# Patient Record
Sex: Female | Born: 1950
Health system: Southern US, Community
[De-identification: ages and names within clinical notes are randomized; demographics above are authoritative.]

## PROBLEM LIST (undated history)

## (undated) DIAGNOSIS — F419 Anxiety disorder, unspecified: Secondary | ICD-10-CM

## (undated) DIAGNOSIS — N390 Urinary tract infection, site not specified: Secondary | ICD-10-CM

## (undated) DIAGNOSIS — K219 Gastro-esophageal reflux disease without esophagitis: Secondary | ICD-10-CM

## (undated) DIAGNOSIS — E785 Hyperlipidemia, unspecified: Secondary | ICD-10-CM

## (undated) DIAGNOSIS — H409 Unspecified glaucoma: Secondary | ICD-10-CM

## (undated) DIAGNOSIS — I10 Essential (primary) hypertension: Secondary | ICD-10-CM

## (undated) DIAGNOSIS — E039 Hypothyroidism, unspecified: Secondary | ICD-10-CM

## (undated) DIAGNOSIS — H269 Unspecified cataract: Secondary | ICD-10-CM

## (undated) DIAGNOSIS — M1990 Unspecified osteoarthritis, unspecified site: Secondary | ICD-10-CM

## (undated) DIAGNOSIS — T7840XA Allergy, unspecified, initial encounter: Secondary | ICD-10-CM

## (undated) DIAGNOSIS — E669 Obesity, unspecified: Secondary | ICD-10-CM

## (undated) DIAGNOSIS — G4730 Sleep apnea, unspecified: Secondary | ICD-10-CM

## (undated) DIAGNOSIS — G4733 Obstructive sleep apnea (adult) (pediatric): Secondary | ICD-10-CM

## (undated) DIAGNOSIS — F32A Depression, unspecified: Secondary | ICD-10-CM

## (undated) DIAGNOSIS — D509 Iron deficiency anemia, unspecified: Secondary | ICD-10-CM

## (undated) DIAGNOSIS — F329 Major depressive disorder, single episode, unspecified: Secondary | ICD-10-CM

## (undated) HISTORY — DX: Iron deficiency anemia, unspecified: D50.9

## (undated) HISTORY — DX: Essential (primary) hypertension: I10

## (undated) HISTORY — DX: Unspecified glaucoma: H40.9

## (undated) HISTORY — DX: Allergy, unspecified, initial encounter: T78.40XA

## (undated) HISTORY — DX: Depression, unspecified: F32.A

## (undated) HISTORY — PX: BELPHAROPTOSIS REPAIR: SHX369

## (undated) HISTORY — DX: Anxiety disorder, unspecified: F41.9

## (undated) HISTORY — DX: Sleep apnea, unspecified: G47.30

## (undated) HISTORY — DX: Urinary tract infection, site not specified: N39.0

## (undated) HISTORY — DX: Obstructive sleep apnea (adult) (pediatric): G47.33

## (undated) HISTORY — DX: Gastro-esophageal reflux disease without esophagitis: K21.9

## (undated) HISTORY — DX: Unspecified osteoarthritis, unspecified site: M19.90

## (undated) HISTORY — PX: UPPER GASTROINTESTINAL ENDOSCOPY: SHX188

## (undated) HISTORY — DX: Hyperlipidemia, unspecified: E78.5

## (undated) HISTORY — PX: COLONOSCOPY: SHX174

## (undated) HISTORY — PX: FOOT SURGERY: SHX648

## (undated) HISTORY — DX: Hypothyroidism, unspecified: E03.9

## (undated) HISTORY — PX: ENDOMETRIAL BIOPSY: SHX622

## (undated) HISTORY — DX: Obesity, unspecified: E66.9

## (undated) HISTORY — DX: Unspecified cataract: H26.9

## (undated) HISTORY — PX: DILATION AND CURETTAGE OF UTERUS: SHX78

## (undated) HISTORY — DX: Major depressive disorder, single episode, unspecified: F32.9

---

## 1999-01-14 ENCOUNTER — Other Ambulatory Visit: Admission: RE | Admit: 1999-01-14 | Discharge: 1999-01-14 | Payer: Self-pay | Admitting: Family Medicine

## 2000-01-27 ENCOUNTER — Other Ambulatory Visit: Admission: RE | Admit: 2000-01-27 | Discharge: 2000-01-27 | Payer: Self-pay | Admitting: Family Medicine

## 2001-02-01 ENCOUNTER — Other Ambulatory Visit: Admission: RE | Admit: 2001-02-01 | Discharge: 2001-02-01 | Payer: Self-pay | Admitting: Family Medicine

## 2002-03-13 ENCOUNTER — Other Ambulatory Visit: Admission: RE | Admit: 2002-03-13 | Discharge: 2002-03-13 | Payer: Self-pay | Admitting: Family Medicine

## 2003-03-18 ENCOUNTER — Other Ambulatory Visit: Admission: RE | Admit: 2003-03-18 | Discharge: 2003-03-18 | Payer: Self-pay | Admitting: Family Medicine

## 2004-07-27 ENCOUNTER — Other Ambulatory Visit: Admission: RE | Admit: 2004-07-27 | Discharge: 2004-07-27 | Payer: Self-pay | Admitting: Family Medicine

## 2004-07-27 ENCOUNTER — Ambulatory Visit: Payer: Self-pay | Admitting: Family Medicine

## 2004-07-27 LAB — CONVERTED CEMR LAB: Pap Smear: NORMAL

## 2004-09-03 ENCOUNTER — Ambulatory Visit: Payer: Self-pay | Admitting: Family Medicine

## 2004-10-28 ENCOUNTER — Ambulatory Visit: Payer: Self-pay | Admitting: Family Medicine

## 2005-01-05 ENCOUNTER — Ambulatory Visit: Payer: Self-pay | Admitting: Family Medicine

## 2005-03-09 ENCOUNTER — Ambulatory Visit: Payer: Self-pay | Admitting: Family Medicine

## 2005-03-14 ENCOUNTER — Ambulatory Visit: Payer: Self-pay | Admitting: Family Medicine

## 2005-04-11 ENCOUNTER — Ambulatory Visit: Payer: Self-pay | Admitting: Family Medicine

## 2005-05-11 ENCOUNTER — Ambulatory Visit (HOSPITAL_BASED_OUTPATIENT_CLINIC_OR_DEPARTMENT_OTHER): Admission: RE | Admit: 2005-05-11 | Discharge: 2005-05-11 | Payer: Self-pay | Admitting: Family Medicine

## 2005-05-11 ENCOUNTER — Encounter: Payer: Self-pay | Admitting: Pulmonary Disease

## 2005-05-19 ENCOUNTER — Ambulatory Visit: Payer: Self-pay | Admitting: Pulmonary Disease

## 2005-06-06 ENCOUNTER — Ambulatory Visit: Payer: Self-pay | Admitting: Pulmonary Disease

## 2005-07-11 ENCOUNTER — Ambulatory Visit: Payer: Self-pay | Admitting: Pulmonary Disease

## 2005-08-17 ENCOUNTER — Ambulatory Visit: Payer: Self-pay | Admitting: Family Medicine

## 2005-09-23 ENCOUNTER — Ambulatory Visit: Payer: Self-pay | Admitting: Family Medicine

## 2005-10-06 ENCOUNTER — Ambulatory Visit: Payer: Self-pay | Admitting: Family Medicine

## 2005-12-06 ENCOUNTER — Ambulatory Visit: Payer: Self-pay | Admitting: Family Medicine

## 2006-01-13 ENCOUNTER — Ambulatory Visit: Payer: Self-pay | Admitting: Pulmonary Disease

## 2006-01-16 ENCOUNTER — Ambulatory Visit: Payer: Self-pay | Admitting: Family Medicine

## 2006-01-18 ENCOUNTER — Ambulatory Visit: Payer: Self-pay | Admitting: Family Medicine

## 2006-01-20 ENCOUNTER — Ambulatory Visit: Payer: Self-pay | Admitting: Family Medicine

## 2006-01-27 ENCOUNTER — Ambulatory Visit: Payer: Self-pay | Admitting: Family Medicine

## 2006-02-17 ENCOUNTER — Ambulatory Visit: Payer: Self-pay | Admitting: Family Medicine

## 2006-02-23 ENCOUNTER — Ambulatory Visit: Payer: Self-pay | Admitting: Family Medicine

## 2006-07-05 ENCOUNTER — Ambulatory Visit: Payer: Self-pay | Admitting: Family Medicine

## 2006-07-17 ENCOUNTER — Ambulatory Visit: Payer: Self-pay | Admitting: Pulmonary Disease

## 2006-08-23 ENCOUNTER — Ambulatory Visit: Payer: Self-pay | Admitting: Family Medicine

## 2006-10-06 ENCOUNTER — Ambulatory Visit: Payer: Self-pay | Admitting: Family Medicine

## 2006-11-24 ENCOUNTER — Encounter: Payer: Self-pay | Admitting: Family Medicine

## 2006-11-24 DIAGNOSIS — G4730 Sleep apnea, unspecified: Secondary | ICD-10-CM

## 2006-11-24 DIAGNOSIS — K449 Diaphragmatic hernia without obstruction or gangrene: Secondary | ICD-10-CM | POA: Insufficient documentation

## 2006-11-24 DIAGNOSIS — E785 Hyperlipidemia, unspecified: Secondary | ICD-10-CM

## 2006-11-24 DIAGNOSIS — I4949 Other premature depolarization: Secondary | ICD-10-CM

## 2006-11-24 DIAGNOSIS — G2581 Restless legs syndrome: Secondary | ICD-10-CM

## 2006-11-24 DIAGNOSIS — J45909 Unspecified asthma, uncomplicated: Secondary | ICD-10-CM | POA: Insufficient documentation

## 2006-11-24 DIAGNOSIS — J309 Allergic rhinitis, unspecified: Secondary | ICD-10-CM | POA: Insufficient documentation

## 2006-11-24 DIAGNOSIS — E1159 Type 2 diabetes mellitus with other circulatory complications: Secondary | ICD-10-CM | POA: Insufficient documentation

## 2006-11-24 DIAGNOSIS — I152 Hypertension secondary to endocrine disorders: Secondary | ICD-10-CM | POA: Insufficient documentation

## 2006-11-24 DIAGNOSIS — E119 Type 2 diabetes mellitus without complications: Secondary | ICD-10-CM | POA: Insufficient documentation

## 2006-11-24 DIAGNOSIS — M5030 Other cervical disc degeneration, unspecified cervical region: Secondary | ICD-10-CM

## 2006-11-24 DIAGNOSIS — N318 Other neuromuscular dysfunction of bladder: Secondary | ICD-10-CM | POA: Insufficient documentation

## 2006-11-24 DIAGNOSIS — K649 Unspecified hemorrhoids: Secondary | ICD-10-CM | POA: Insufficient documentation

## 2006-11-24 DIAGNOSIS — E039 Hypothyroidism, unspecified: Secondary | ICD-10-CM

## 2006-11-24 DIAGNOSIS — I10 Essential (primary) hypertension: Secondary | ICD-10-CM

## 2006-11-24 DIAGNOSIS — K219 Gastro-esophageal reflux disease without esophagitis: Secondary | ICD-10-CM

## 2006-11-28 ENCOUNTER — Other Ambulatory Visit: Admission: RE | Admit: 2006-11-28 | Discharge: 2006-11-28 | Payer: Self-pay | Admitting: Family Medicine

## 2006-11-28 ENCOUNTER — Ambulatory Visit: Payer: Self-pay | Admitting: Family Medicine

## 2006-11-28 ENCOUNTER — Encounter: Payer: Self-pay | Admitting: Family Medicine

## 2006-12-01 ENCOUNTER — Ambulatory Visit: Payer: Self-pay | Admitting: Family Medicine

## 2006-12-01 LAB — CONVERTED CEMR LAB
ALT: 19 units/L (ref 0–40)
AST: 18 units/L (ref 0–37)
Alkaline Phosphatase: 59 units/L (ref 39–117)
Basophils Absolute: 0 10*3/uL (ref 0.0–0.1)
Basophils Relative: 0.5 % (ref 0.0–1.0)
Chloride: 106 meq/L (ref 96–112)
Direct LDL: 80.6 mg/dL
GFR calc Af Amer: 165 mL/min
GFR calc non Af Amer: 136 mL/min
Hemoglobin: 12.6 g/dL (ref 12.0–15.0)
Hgb A1c MFr Bld: 6.7 % — ABNORMAL HIGH (ref 4.6–6.0)
Microalb, Ur: 5.1 mg/dL — ABNORMAL HIGH (ref 0.0–1.9)
Monocytes Absolute: 0.4 10*3/uL (ref 0.2–0.7)
Neutro Abs: 3.3 10*3/uL (ref 1.4–7.7)
Potassium: 3.8 meq/L (ref 3.5–5.1)
RDW: 12 % (ref 11.5–14.6)
Sodium: 143 meq/L (ref 135–145)
Total Bilirubin: 0.9 mg/dL (ref 0.3–1.2)
Total CHOL/HDL Ratio: 4
Total Protein: 6.4 g/dL (ref 6.0–8.3)

## 2007-02-13 ENCOUNTER — Ambulatory Visit: Payer: Self-pay | Admitting: Family Medicine

## 2007-06-29 ENCOUNTER — Ambulatory Visit: Payer: Self-pay | Admitting: Family Medicine

## 2007-11-11 ENCOUNTER — Encounter: Payer: Self-pay | Admitting: Internal Medicine

## 2007-11-13 ENCOUNTER — Ambulatory Visit: Payer: Self-pay | Admitting: Internal Medicine

## 2008-03-28 ENCOUNTER — Ambulatory Visit: Payer: Self-pay | Admitting: Family Medicine

## 2008-03-28 DIAGNOSIS — N951 Menopausal and female climacteric states: Secondary | ICD-10-CM | POA: Insufficient documentation

## 2008-04-02 ENCOUNTER — Ambulatory Visit: Payer: Self-pay | Admitting: Family Medicine

## 2008-04-03 LAB — CONVERTED CEMR LAB
AST: 32 units/L (ref 0–37)
Alkaline Phosphatase: 59 units/L (ref 39–117)
BUN: 13 mg/dL (ref 6–23)
Basophils Absolute: 0 10*3/uL (ref 0.0–0.1)
CO2: 30 meq/L (ref 19–32)
Calcium: 9.3 mg/dL (ref 8.4–10.5)
Chloride: 102 meq/L (ref 96–112)
Creatinine, Ser: 0.7 mg/dL (ref 0.4–1.2)
GFR calc Af Amer: 111 mL/min
HDL: 31 mg/dL — ABNORMAL LOW (ref >39.0)
Hgb A1c MFr Bld: 8.3 % — ABNORMAL HIGH (ref 4.6–6.0)
Lymphocytes Relative: 28.6 % (ref 12.0–46.0)
MCHC: 35.9 g/dL (ref 30.0–36.0)
Microalb Creat Ratio: 54 mg/g — ABNORMAL HIGH (ref 0.0–30.0)
Microalb, Ur: 5.5 mg/dL — ABNORMAL HIGH (ref 0.0–1.9)
Monocytes Relative: 6.6 % (ref 3.0–12.0)
Neutrophils Relative %: 60.3 % (ref 43.0–77.0)
TSH: 1.78 microintl units/mL (ref 0.35–5.50)
VLDL: 50 mg/dL — ABNORMAL HIGH (ref 0–40)

## 2008-04-22 ENCOUNTER — Encounter: Payer: Self-pay | Admitting: Family Medicine

## 2008-05-02 ENCOUNTER — Ambulatory Visit: Payer: Self-pay | Admitting: Family Medicine

## 2008-05-07 ENCOUNTER — Encounter: Payer: Self-pay | Admitting: Family Medicine

## 2008-05-14 ENCOUNTER — Telehealth: Payer: Self-pay | Admitting: Family Medicine

## 2008-06-03 ENCOUNTER — Telehealth: Payer: Self-pay | Admitting: Family Medicine

## 2008-06-23 ENCOUNTER — Ambulatory Visit: Payer: Self-pay | Admitting: Family Medicine

## 2008-06-25 LAB — CONVERTED CEMR LAB
Albumin: 3.9 g/dL (ref 3.5–5.2)
CO2: 31 meq/L (ref 19–32)
Calcium: 9.7 mg/dL (ref 8.4–10.5)
Creatinine, Ser: 0.8 mg/dL (ref 0.4–1.2)
Glucose, Bld: 115 mg/dL — ABNORMAL HIGH (ref 70–99)
Phosphorus: 4.7 mg/dL — ABNORMAL HIGH (ref 2.3–4.6)
Potassium: 4 meq/L (ref 3.5–5.1)

## 2008-08-15 ENCOUNTER — Encounter: Payer: Self-pay | Admitting: Family Medicine

## 2008-09-15 ENCOUNTER — Ambulatory Visit: Payer: Self-pay | Admitting: Family Medicine

## 2008-09-16 LAB — CONVERTED CEMR LAB
ALT: 19 units/L (ref 0–35)
AST: 18 units/L (ref 0–37)
Albumin: 3.8 g/dL (ref 3.5–5.2)
BUN: 17 mg/dL (ref 6–23)
GFR calc Af Amer: 95 mL/min
GFR calc non Af Amer: 79 mL/min
Glucose, Bld: 109 mg/dL — ABNORMAL HIGH (ref 70–99)
LDL Cholesterol: 82 mg/dL (ref 0–99)
Phosphorus: 3.9 mg/dL (ref 2.3–4.6)

## 2008-09-22 ENCOUNTER — Ambulatory Visit: Payer: Self-pay | Admitting: Family Medicine

## 2008-11-26 ENCOUNTER — Ambulatory Visit: Payer: Self-pay | Admitting: Family Medicine

## 2008-11-26 LAB — CONVERTED CEMR LAB
Bilirubin Urine: NEGATIVE
Blood in Urine, dipstick: NEGATIVE
Glucose, Urine, Semiquant: NEGATIVE
Ketones, urine, test strip: NEGATIVE
Nitrite: NEGATIVE
Urobilinogen, UA: 0.2

## 2009-02-09 ENCOUNTER — Ambulatory Visit: Payer: Self-pay | Admitting: Family Medicine

## 2009-03-10 ENCOUNTER — Ambulatory Visit: Payer: Self-pay | Admitting: Family Medicine

## 2009-03-23 ENCOUNTER — Ambulatory Visit: Payer: Self-pay | Admitting: Family Medicine

## 2009-03-24 LAB — CONVERTED CEMR LAB
ALT: 19 units/L (ref 0–35)
AST: 22 units/L (ref 0–37)
Cholesterol: 141 mg/dL (ref 0–200)
Microalb Creat Ratio: 6.6 mg/g (ref 0.0–30.0)
Sodium: 142 meq/L (ref 135–145)
Triglycerides: 149 mg/dL (ref 0.0–149.0)

## 2009-03-31 ENCOUNTER — Telehealth: Payer: Self-pay | Admitting: Family Medicine

## 2009-04-03 ENCOUNTER — Ambulatory Visit: Payer: Self-pay | Admitting: Family Medicine

## 2009-04-03 ENCOUNTER — Telehealth: Payer: Self-pay | Admitting: Family Medicine

## 2009-04-23 ENCOUNTER — Encounter: Payer: Self-pay | Admitting: Family Medicine

## 2009-05-04 ENCOUNTER — Encounter (INDEPENDENT_AMBULATORY_CARE_PROVIDER_SITE_OTHER): Payer: Self-pay | Admitting: *Deleted

## 2009-05-08 ENCOUNTER — Ambulatory Visit: Payer: Self-pay | Admitting: Family Medicine

## 2009-07-09 ENCOUNTER — Ambulatory Visit: Payer: Self-pay | Admitting: Family Medicine

## 2009-07-09 ENCOUNTER — Encounter (INDEPENDENT_AMBULATORY_CARE_PROVIDER_SITE_OTHER): Payer: Self-pay | Admitting: Internal Medicine

## 2009-07-09 LAB — CONVERTED CEMR LAB
Bilirubin Urine: NEGATIVE
Glucose, Urine, Semiquant: NEGATIVE
Nitrite: POSITIVE
Protein, U semiquant: 30
Specific Gravity, Urine: 1.015
pH: 7

## 2009-07-13 ENCOUNTER — Telehealth: Payer: Self-pay | Admitting: Family Medicine

## 2009-08-06 ENCOUNTER — Ambulatory Visit: Payer: Self-pay | Admitting: Family Medicine

## 2009-08-06 LAB — CONVERTED CEMR LAB
Glucose, Urine, Semiquant: NEGATIVE
Nitrite: NEGATIVE
Specific Gravity, Urine: 1.01
Urobilinogen, UA: 0.2
pH: 7.5

## 2009-08-07 ENCOUNTER — Encounter (INDEPENDENT_AMBULATORY_CARE_PROVIDER_SITE_OTHER): Payer: Self-pay | Admitting: Internal Medicine

## 2009-09-21 ENCOUNTER — Ambulatory Visit: Payer: Self-pay | Admitting: Family Medicine

## 2009-09-22 ENCOUNTER — Telehealth: Payer: Self-pay | Admitting: Family Medicine

## 2009-09-22 LAB — CONVERTED CEMR LAB
ALT: 22 units/L (ref 0–35)
BUN: 17 mg/dL (ref 6–23)
CO2: 28 meq/L (ref 19–32)
Chloride: 107 meq/L (ref 96–112)
HDL: 37.5 mg/dL — ABNORMAL LOW (ref >39.00)
Phosphorus: 3.9 mg/dL (ref 2.3–4.6)
Total CHOL/HDL Ratio: 4
VLDL: 25 mg/dL (ref 0.0–40.0)

## 2009-09-28 ENCOUNTER — Ambulatory Visit: Payer: Self-pay | Admitting: Family Medicine

## 2009-11-03 ENCOUNTER — Ambulatory Visit: Payer: Self-pay | Admitting: Family Medicine

## 2009-11-04 ENCOUNTER — Encounter: Payer: Self-pay | Admitting: Family Medicine

## 2009-11-04 LAB — CONVERTED CEMR LAB: Free T4: 1.3 ng/dL (ref 0.6–1.6)

## 2009-12-03 ENCOUNTER — Ambulatory Visit: Payer: Self-pay | Admitting: Family Medicine

## 2009-12-03 LAB — CONVERTED CEMR LAB
Bilirubin Urine: NEGATIVE
Protein, U semiquant: 30
Specific Gravity, Urine: 1.015
Urobilinogen, UA: 0.2
pH: 7

## 2009-12-17 ENCOUNTER — Ambulatory Visit: Payer: Self-pay | Admitting: Family Medicine

## 2009-12-22 ENCOUNTER — Encounter: Payer: Self-pay | Admitting: Family Medicine

## 2010-01-05 ENCOUNTER — Ambulatory Visit: Payer: Self-pay | Admitting: Family Medicine

## 2010-01-14 ENCOUNTER — Telehealth: Payer: Self-pay | Admitting: Family Medicine

## 2010-01-18 ENCOUNTER — Ambulatory Visit: Payer: Self-pay | Admitting: Family Medicine

## 2010-01-18 LAB — CONVERTED CEMR LAB
Ketones, urine, test strip: NEGATIVE
Nitrite: NEGATIVE

## 2010-01-29 ENCOUNTER — Ambulatory Visit: Payer: Self-pay | Admitting: Pulmonary Disease

## 2010-02-03 ENCOUNTER — Ambulatory Visit: Payer: Self-pay | Admitting: Family Medicine

## 2010-02-03 DIAGNOSIS — N390 Urinary tract infection, site not specified: Secondary | ICD-10-CM

## 2010-02-13 LAB — CONVERTED CEMR LAB: Hgb A1c MFr Bld: 7.2 % — ABNORMAL HIGH (ref 4.6–6.5)

## 2010-03-09 ENCOUNTER — Ambulatory Visit: Payer: Self-pay | Admitting: Endocrinology

## 2010-03-12 ENCOUNTER — Encounter: Admission: RE | Admit: 2010-03-12 | Discharge: 2010-03-12 | Payer: Self-pay | Admitting: Endocrinology

## 2010-03-16 ENCOUNTER — Ambulatory Visit: Payer: Self-pay | Admitting: Family Medicine

## 2010-03-16 LAB — CONVERTED CEMR LAB
ALT: 19 units/L (ref 0–35)
Albumin: 3.7 g/dL (ref 3.5–5.2)
Chloride: 109 meq/L (ref 96–112)
Direct LDL: 78.4 mg/dL
GFR calc non Af Amer: 84.03 mL/min (ref >60)
HDL: 32.7 mg/dL — ABNORMAL LOW (ref >39.00)
Microalb Creat Ratio: 1.5 mg/g (ref 0.0–30.0)
Microalb, Ur: 4.6 mg/dL — ABNORMAL HIGH (ref 0.0–1.9)
Sodium: 144 meq/L (ref 135–145)
Total CHOL/HDL Ratio: 4
VLDL: 49.6 mg/dL — ABNORMAL HIGH (ref 0.0–40.0)

## 2010-03-31 ENCOUNTER — Telehealth: Payer: Self-pay | Admitting: Family Medicine

## 2010-04-06 ENCOUNTER — Other Ambulatory Visit: Admission: RE | Admit: 2010-04-06 | Discharge: 2010-04-06 | Payer: Self-pay | Admitting: Family Medicine

## 2010-04-06 ENCOUNTER — Ambulatory Visit: Payer: Self-pay | Admitting: Family Medicine

## 2010-04-06 LAB — CONVERTED CEMR LAB: Pap Smear: NORMAL

## 2010-04-13 ENCOUNTER — Telehealth: Payer: Self-pay | Admitting: Family Medicine

## 2010-04-14 ENCOUNTER — Encounter: Payer: Self-pay | Admitting: Family Medicine

## 2010-04-14 LAB — CONVERTED CEMR LAB: Pap Smear: NEGATIVE

## 2010-04-22 ENCOUNTER — Ambulatory Visit: Payer: Self-pay | Admitting: Family Medicine

## 2010-05-05 ENCOUNTER — Encounter: Admission: RE | Admit: 2010-05-05 | Discharge: 2010-05-26 | Payer: Self-pay | Admitting: Family Medicine

## 2010-05-05 ENCOUNTER — Ambulatory Visit: Payer: Self-pay | Admitting: Endocrinology

## 2010-05-06 ENCOUNTER — Encounter: Payer: Self-pay | Admitting: Family Medicine

## 2010-05-12 ENCOUNTER — Encounter: Payer: Self-pay | Admitting: Family Medicine

## 2010-05-13 ENCOUNTER — Telehealth: Payer: Self-pay | Admitting: Family Medicine

## 2010-05-17 ENCOUNTER — Encounter (INDEPENDENT_AMBULATORY_CARE_PROVIDER_SITE_OTHER): Payer: Self-pay | Admitting: *Deleted

## 2010-05-17 ENCOUNTER — Encounter: Payer: Self-pay | Admitting: Family Medicine

## 2010-05-22 ENCOUNTER — Encounter: Payer: Self-pay | Admitting: Pulmonary Disease

## 2010-05-26 ENCOUNTER — Encounter: Payer: Self-pay | Admitting: Family Medicine

## 2010-05-27 ENCOUNTER — Ambulatory Visit: Payer: Self-pay | Admitting: Family Medicine

## 2010-07-05 ENCOUNTER — Ambulatory Visit: Payer: Self-pay | Admitting: Family Medicine

## 2010-07-09 ENCOUNTER — Telehealth: Payer: Self-pay | Admitting: Family Medicine

## 2010-07-21 ENCOUNTER — Ambulatory Visit: Payer: Self-pay | Admitting: Endocrinology

## 2010-08-09 ENCOUNTER — Encounter: Payer: Self-pay | Admitting: Family Medicine

## 2010-09-02 ENCOUNTER — Ambulatory Visit
Admission: RE | Admit: 2010-09-02 | Discharge: 2010-09-02 | Payer: Self-pay | Source: Home / Self Care | Attending: Endocrinology | Admitting: Endocrinology

## 2010-09-07 LAB — CONVERTED CEMR LAB: TSH: 3.8 microintl units/mL (ref 0.35–5.50)

## 2010-10-05 NOTE — Progress Notes (Signed)
Summary: refill request for mobic  Phone Note Refill Request Message from:  Fax from Pharmacy  Refills Requested: Medication #1:  MOBIC 15 MG TABS Take 1 tablet by mouth once a day with food as needed   Last Refilled: 10/30/2009 Faxed request from [facility].  Initial call taken by: Lowella Petties CMA,  Jan 14, 2010 2:11 PM  Follow-up for Phone Call        px written on EMR for call in  Follow-up by: Judith Part MD,  Jan 14, 2010 4:17 PM  Additional Follow-up for Phone Call Additional follow up Details #1::        [facility]. Lewanda Rife LPN  Jan 15, 2010 12:59 PM     Prescriptions: MOBIC 15 MG TABS (MELOXICAM) Take 1 tablet by mouth once a day with food as needed  #30 x 5   Entered and Authorized by:   Judith Part MD   Signed by:   Lewanda Rife LPN on 16/06/9603   Method used:   Telephoned to ...       [facility] [address] (retail)       [address] [place], [place]  [address]       Ph: [phone]       Fax: [phone]   RxID:   [id number]

## 2010-10-05 NOTE — Progress Notes (Signed)
Summary: refill request for mobic  Phone Note Refill Request Message from:  Fax from Pharmacy  Refills Requested: Medication #1:  MOBIC 15 MG TABS Take 1 tablet by mouth once a day with food as needed   Last Refilled: 02-13-1951 Faxed request from [facility] [address]  Initial call taken by: Lowella Petties CMA, AAMA,  July 09, 2010 10:24 AM  Follow-up for Phone Call        looks like she was refilled for the year in aug?   Follow-up by: Judith Part MD,  July 09, 2010 11:16 AM  Additional Follow-up for Phone Call Additional follow up Details #1::        Spoke with pharmacist at [facility] and he could not find call in from 04/13/10 but he will refill until 04/2011.Lewanda Rife LPN  July 09, 2010 2:59 PM

## 2010-10-05 NOTE — Progress Notes (Signed)
Summary: ? Diabetes medication  Phone Note Call from Patient Call back at Home Phone [phone]   Caller: Patient Call For: Judith Part MD Summary of Call: Patient called said she thinks she has a virus.  Has a low grade fever and is vomiting.  She is a diabetic and wants to know if she should continue to try and take her diabetes medication or should she wait and see if she feels better, and not vomiting before she takes medication again.  Please advise Initial call taken by: Linde Gillis CMA Duncan Dull),  September 22, 2009 4:49 PM  Follow-up for Phone Call        I want her to hold her glucotrol xl until she is eating normally again-- this is most likely to cause low sugar f/u if not improving Follow-up by: Judith Part MD,  September 22, 2009 5:02 PM  Additional Follow-up for Phone Call Additional follow up Details #1::        Advised pt. Additional Follow-up by: Lowella Petties CMA,  September 22, 2009 5:21 PM

## 2010-10-05 NOTE — Letter (Signed)
Summary: Results Follow up Letter  [place] [facility]  [address] [place], [place] [address]   Phone: [phone]  Fax: [phone]    04/14/2010 MRN: [id number]  [facility] [address] [place], [place]  [address]  Dear Ms. Coppolino,  The following are the results of your recent test(s):  Test         Result    Pap Smear:        Normal __X___  Not Normal _____ Comments: ______________________________________________________ Cholesterol: LDL(Bad cholesterol):         Your goal is less than:         HDL (Good cholesterol):       Your goal is more than: Comments:  ______________________________________________________ Mammogram:        Normal _____  Not Normal _____ Comments:  ___________________________________________________________________ Hemoccult:        Normal _____  Not normal _______ Comments:    _____________________________________________________________________ Other Tests:    We routinely do not discuss normal results over the telephone.  If you desire a copy of the results, or you have any questions about this information we can discuss them at your next office visit.   Sincerely,     Roxy Manns, MD

## 2010-10-05 NOTE — Miscellaneous (Signed)
Summary: optimal pressure 17cm   Clinical Lists Changes  Orders: Added new Referral order of DME Referral (DME) - Signed auto shows great compliance, no leak, optimal pressure 17cm

## 2010-10-05 NOTE — Miscellaneous (Signed)
Summary: Flu vaccine   Clinical Lists Changes  Observations: Added new observation of FLU VAX: Historical (08/04/2010 13:51)      Influenza Immunization History:    Influenza # 1:  Historical (08/04/2010) Received faxed form from [facility], [place], [place]

## 2010-10-05 NOTE — Assessment & Plan Note (Signed)
Summary: ?UTI/CLE   Vital Signs:  Patient profile:   60 year old female Height:      64 inches Weight:      221.25 pounds BMI:     38.11 Temp:     98.3 degrees F oral Pulse rate:   80 / minute Pulse rhythm:   regular BP sitting:   122 / 82  (left arm) Cuff size:   large  Vitals Entered By: Linde Gillis CMA Duncan Dull) (Jan 18, 2010 2:55 PM) CC: ? UTI   History of Present Illness: 60 yo here for UTI symptoms.  Woke up this morning with increased urinary frequency, dysuria. No hematuria. No n/v, no fevers, no back pain.  Last seen on 3/31 with UTI, was pansenstive.  Treat with cipro.  PMH- has had 5 UTIs in past 10 months.  Always occurs after sexual intercourse.  Current Medications (verified): 1)  Flexeril 10 Mg Tabs (Cyclobenzaprine Hcl) .... Take 1 Tablet By Mouth Three Times A Day As Needed 2)  Monopril Hct 10-12.5 Mg Tabs (Fosinopril Sodium-Hctz) .... Take 1 Tablet By Mouth Once A Day 3)  Lipitor 20 Mg Tabs (Atorvastatin Calcium) .... Take 1/2 Tablet By Mouth Once A Day 4)  Glucotrol Xl 5 Mg Tb24 (Glipizide) .... Take 1 Tablet By Mouth Once A Day 5)  Hyoscyamine Sulfate 0.125 Mg Subl (Hyoscyamine Sulfate) .... Place 1 Tablet Under Tongue Once A Day As Needed 6)  Mobic 15 Mg Tabs (Meloxicam) .... Take 1 Tablet By Mouth Once A Day With Food As Needed 7)  Loratadine 10 Mg Tabs (Loratadine) .... Take One By Mouth Dialy 8)  Albuterol 90 Mcg/act Aers (Albuterol) .... 2 Puffs Up To Every 4 Hours As Needed Wheezing 9)  Advair Diskus 100-50 Mcg/dose Misc (Fluticasone-Salmeterol) .Marland Kitchen.. 1 Inhalation Two Times A Day During Allergy Season 10)  Calcium 600 Mg Tabs (Calcium) .... Take 2 By Mouth Daily 11)  Onetouch Ultra Test   Strp (Glucose Blood) .... Check Sugar Twice A Day As Instructed Dm 250.0 That Is Not Optimally Controlled 12)  Metformin Hcl 1000 Mg Tabs (Metformin Hcl) .Marland Kitchen.. 1 By Mouth Two Times A Day 13)  Multivitamins   Tabs (Multiple Vitamin) .... One Daily 14)  Pyridium 100 Mg  Tabs (Phenazopyridine Hcl) .Marland Kitchen.. 1 Tab By Mouth Three Times A Day X 2 Days 15)  Synthroid 50 Mcg Tabs (Levothyroxine Sodium) .... Take 1 Tablet By Mouth Once A Day 16)  Prednisone 10 Mg Tabs (Prednisone) .... 4 Tab By Mouth X 1 Week, 3 Tabs By Mouth X 1 Week, 2 Tabs By Mouth X 4 Days, 1 Tab By Mouth X Days. Dispense Qs 17)  Bactrim Ds 800-160 Mg Tabs (Sulfamethoxazole-Trimethoprim) .Marland Kitchen.. 1 Tab By Mouth Two Times A Day X 3 Days. 18)  Pyridium 100 Mg Tabs (Phenazopyridine Hcl) .Marland Kitchen.. 1 Tab By Mouth Three Times A Day X 2 Days 19)  Sulfamethoxazole-Trimethoprim 400-80 Mg Tabs (Sulfamethoxazole-Trimethoprim) .Marland Kitchen.. 1 Tab By Mouth After Intercourse.  Allergies (verified): No Known Drug Allergies  Review of Systems      See HPI General:  Denies fever. GI:  Denies nausea and vomiting. GU:  Complains of dysuria and urinary frequency; denies hematuria and incontinence.  Physical Exam  General:  overweight but generally well appearing  Abdomen:  No suprapubic tenderness NO CVA tenderness Psych:  normal affect, talkative and pleasant    Impression & Recommendations:  Problem # 1:  UTI (ICD-599.0) Assessment New UA postive, resend for culture. Place on 3 day  course of bactrim and pyridium. Start post coital prophylaxis. Her updated medication list for this problem includes:    Pyridium 100 Mg Tabs (Phenazopyridine hcl) .Marland Kitchen... 1 tab by mouth three times a day x 2 days    Bactrim Ds 800-160 Mg Tabs (Sulfamethoxazole-trimethoprim) .Marland Kitchen... 1 tab by mouth two times a day x 3 days.    Sulfamethoxazole-trimethoprim 400-80 Mg Tabs (Sulfamethoxazole-trimethoprim) .Marland Kitchen... 1 tab by mouth after intercourse.  Orders: UA Dipstick w/o Micro (manual) (81191) T-Culture, Urine ([id number])  Complete Medication List: 1)  Flexeril 10 Mg Tabs (Cyclobenzaprine hcl) .... Take 1 tablet by mouth three times a day as needed 2)  Monopril Hct 10-12.5 Mg Tabs (Fosinopril sodium-hctz) .... Take 1 tablet by mouth once a day 3)   Lipitor 20 Mg Tabs (Atorvastatin calcium) .... Take 1/2 tablet by mouth once a day 4)  Glucotrol Xl 5 Mg Tb24 (Glipizide) .... Take 1 tablet by mouth once a day 5)  Hyoscyamine Sulfate 0.125 Mg Subl (Hyoscyamine sulfate) .... Place 1 tablet under tongue once a day as needed 6)  Mobic 15 Mg Tabs (Meloxicam) .... Take 1 tablet by mouth once a day with food as needed 7)  Loratadine 10 Mg Tabs (Loratadine) .... Take one by mouth dialy 8)  Albuterol 90 Mcg/act Aers (Albuterol) .... 2 puffs up to every 4 hours as needed wheezing 9)  Advair Diskus 100-50 Mcg/dose Misc (Fluticasone-salmeterol) .Marland Kitchen.. 1 inhalation two times a day during allergy season 10)  Calcium 600 Mg Tabs (Calcium) .... Take 2 by mouth daily 11)  Onetouch Ultra Test Strp (Glucose blood) .... Check sugar twice a day as instructed dm 250.0 that is not optimally controlled 12)  Metformin Hcl 1000 Mg Tabs (Metformin hcl) .Marland Kitchen.. 1 by mouth two times a day 13)  Multivitamins Tabs (Multiple vitamin) .... One daily 14)  Pyridium 100 Mg Tabs (Phenazopyridine hcl) .Marland Kitchen.. 1 tab by mouth three times a day x 2 days 15)  Synthroid 50 Mcg Tabs (Levothyroxine sodium) .... Take 1 tablet by mouth once a day 16)  Prednisone 10 Mg Tabs (Prednisone) .... 4 tab by mouth x 1 week, 3 tabs by mouth x 1 week, 2 tabs by mouth x 4 days, 1 tab by mouth x days. dispense qs 17)  Bactrim Ds 800-160 Mg Tabs (Sulfamethoxazole-trimethoprim) .Marland Kitchen.. 1 tab by mouth two times a day x 3 days. 18)  Pyridium 100 Mg Tabs (Phenazopyridine hcl) .Marland Kitchen.. 1 tab by mouth three times a day x 2 days 19)  Sulfamethoxazole-trimethoprim 400-80 Mg Tabs (Sulfamethoxazole-trimethoprim) .Marland Kitchen.. 1 tab by mouth after intercourse. Prescriptions: SULFAMETHOXAZOLE-TRIMETHOPRIM 400-80 MG TABS (SULFAMETHOXAZOLE-TRIMETHOPRIM) 1 tab by mouth after intercourse.  #30 x 0   Entered and Authorized by:   Ruthe Mannan MD   Signed by:   Ruthe Mannan MD on 01/18/2010   Method used:   Electronically to        [facility] [address] (retail)       [address] [place], [place]  [address]       Ph: [phone]       Fax: [phone]   RxID:   [id number] PYRIDIUM 100 MG TABS (PHENAZOPYRIDINE HCL) 1 tab by mouth three times a day x 2 days  #6 x 0   Entered and Authorized by:   Ruthe Mannan MD   Signed by:   Ruthe Mannan MD on 01/18/2010   Method used:  Electronically to        [facility]. [address]* (retail)       [address] [place], [place]  [address]       Ph: [phone]       Fax: [phone]   RxID:   [id number] BACTRIM DS 800-160 MG TABS (SULFAMETHOXAZOLE-TRIMETHOPRIM) 1 tab by mouth two times a day x 3 days.  #6 x 0   Entered and Authorized by:   Ruthe Mannan MD   Signed by:   Ruthe Mannan MD on 01/18/2010   Method used:   Electronically to        [facility] [address] (retail)       [address] [place], [place]  [address]       Ph: [phone]       Fax: [phone]   RxID:   [id number]   Current Allergies (reviewed today): No known allergies   Laboratory Results   Urine Tests  Date/Time Received: Jan 18, 2010 3:13 PM   Routine Urinalysis   Color: orange Appearance: Cloudy Glucose: negative   (Normal Range: Negative) Bilirubin: negative   (Normal Range: Negative) Ketone: negative   (Normal Range: Negative) Spec. Gravity: >=1.030   (Normal Range: 1.003-1.035) Blood: moderate   (Normal Range: Negative) pH: 5.0   (Normal Range: 5.0-8.0) Protein: trace   (Normal Range: Negative) Urobilinogen: 0.2   (Normal Range: 0-1) Nitrite: negative   (Normal Range: Negative) Leukocyte Esterace: moderate   (Normal Range: Negative)        Appended Document: ?UTI/CLE

## 2010-10-05 NOTE — Miscellaneous (Signed)
Summary: Synthroid  Medications Added SYNTHROID 75 MCG TABS (LEVOTHYROXINE SODIUM) Take 1 tablet by mouth once a day       Clinical Lists Changes  Medications: Added new medication of SYNTHROID 75 MCG TABS (LEVOTHYROXINE SODIUM) Take 1 tablet by mouth once a day - Signed Removed medication of SYNTHROID 88 MCG TABS (LEVOTHYROXINE SODIUM) 1 by mouth once daily Rx of SYNTHROID 75 MCG TABS (LEVOTHYROXINE SODIUM) Take 1 tablet by mouth once a day;  #30 x 5;  Signed;  Entered by: Delilah Shan CMA (AAMA);  Authorized by: Judith Part MD;  Method used: Electronically to [facility]. [address], [place], [place], [place]  [address], Ph: [phone], Fax: [phone]    Prescriptions: SYNTHROID 75 MCG TABS (LEVOTHYROXINE SODIUM) Take 1 tablet by mouth once a day  #30 x 5   Entered by:   Delilah Shan CMA (AAMA)   Authorized by:   Judith Part MD   Signed by:   Delilah Shan CMA Duncan Dull) on 11/04/2009   Method used:   Electronically to        [facility] [address] (retail)       [address] [place], [place]  [address]       Ph: [phone]       Fax: [phone]   RxID:   [id number]

## 2010-10-05 NOTE — Assessment & Plan Note (Signed)
Summary: REFER FRM DR. TOWER FOR SHOULDER PAIN / LFW   Vital Signs:  Patient profile:   60 year old female Height:      64 inches Weight:      221.6 pounds BMI:     38.18 Temp:     98.3 degrees F oral Pulse rate:   84 / minute Pulse rhythm:   regular BP sitting:   110 / 70  (left arm) Cuff size:   large  Vitals Entered By: Benny Lennert CMA Duncan Dull) (April 22, 2010 9:04 AM)  History of Present Illness: Dr. Milinda Antis referred the patient for a consultation about bilateral shoulder pain:  two-month history of bilateral shoulder pain, left greater than right. No incidence of trauma, fracture, dislocation. The patient has pain with abduction, and also some pain with terminal internal range of motion, primarily on the left. At this point, her right shoulder is minimally symptomatic.  She has taken some Tylenol, NSAIDs, and she has done some ice and heat.  The only inciting events that she can think of, is that she was cleaning and packing up her house, where she is going to move within the next couple of months.  no neuropathic symptoms, radiculopathy or neck pain.  No prior history of traumatic injury, dislocation, fracture, or operative intervention.  Allergies (verified): No Known Drug Allergies  Past History:  Past medical, surgical, family and social histories (including risk factors) reviewed, and no changes noted (except as noted below).  Past Medical History: Reviewed history from 04/06/2010 and no changes required. Allergic rhinitis Asthma (as child) GERD Hyperlipidemia (mild) Hypertension (borderline) Hypothyroidism Diabetes mellitus, type II (2/06 elevated microalbumin) obesity OSA frequent utis with coital prophylaxis   opthy- [facility]  Past Surgical History: Reviewed history from 01/29/2010 and no changes required. Foot surgery Endometrial biopsy (1999) D&C - Fibroids (11/2001) Dexa (03/2002) Stress Cardiolyte (05/2003) 2-D Echo  (05/2003) Holter- PVC's Colonoscopy (07/2003)- hemorroids--no polyp-- re check 10 years  Sleep Study (05/2005)--66/hr with desat to 78%  Family History: Reviewed history from 03/09/2010 and no changes required. M--CAD/ DM, rheumatism F-- alzheimers , kidney stones , rheumatism PGM Bca B CAD for bypass signs mother had hypothyroidism brother has hypothyroidism   Social History: Reviewed history from 03/09/2010 and no changes required. Never Smoked pt is married. pt works as a Runner, broadcasting/film/video.  occ alcohol  some walking for exercise.  Review of Systems       REVIEW OF SYSTEMS  GEN: No systemic complaints, no fevers, chills, sweats, or other acute illnesses MSK: Detailed in the HPI GI: tolerating PO intake without difficulty Neuro: No numbness, parasthesias, or tingling associated. Otherwise the pertinent positives of the ROS are noted above.    Physical Exam  General:  GEN: Well-developed,well-nourished,in no acute distress; alert,appropriate and cooperative throughout examination HEENT: Normocephalic and atraumatic without obvious abnormalities. No apparent alopecia or balding. Ears, externally no deformities PULM: Breathing comfortably in no respiratory distress EXT: No clubbing, cyanosis, or edema PSYCH: Normally interactive. Cooperative during the interview. Pleasant. Friendly and conversant. Not anxious or depressed appearing. Normal, full affect.  Msk:  Shoulder: L Inspection: No muscle wasting or winging Ecchymosis/edema: neg  AC joint, scapula, clavicle: mildly TTP Cervical spine: NT, full ROM Spurling's: neg Abduction: full, 5/5 - painful arc of motion Flexion: full, 5/5 IR, full, lift-off: 5/5 ER at neutral: full, 5/5 AC crossover: neg Neer: pos Hawkins: pos Drop Test: neg Empty Can: pos Supraspinatus insertion: mild-mod T Bicipital groove: NT Speed's: pos Yergason's: neg  Sulcus sign: neg Scapular dyskinesis: none C5-T1 intact  Neuro: Sensation  intact Grip 5/5   right shoulder: Full range of motion, strength 5/5, and all impingement testing is negative    Impression & Recommendations:  Problem # 1:  SHOULDER IMPINGEMENT SYNDROME, LEFT (ICD-726.2) Shoulder anatomy was reviewed with the patient using and anatomical model.   Recommendations include: Rotator cuff strengthening and scapular stabilization exercises were reviewed with the patient.  A handout was given based on a shoulder program from [facility].  Retraining shoulder mechanics and function was emphasized to the patient with rehab done at least 5-6 days a week. The patient could benefit from formal PT to assist with scapular stabilization and RTC strengthening.   L subac injection would likely be of benefit, but patient hesitant, so will address biomechanical issues and reassess at follow-up.  cc: Dr. Milinda Antis  Orders: Physical Therapy Referral (PT)  Problem # 2:  ROTATOR CUFF SYNDROME, LEFT (ICD-726.10)  Complete Medication List: 1)  Flexeril 10 Mg Tabs (Cyclobenzaprine hcl) .... Take 1 tablet by mouth three times a day as needed 2)  Monopril Hct 10-12.5 Mg Tabs (Fosinopril sodium-hctz) .... Take 1 tablet by mouth once a day 3)  Lipitor 20 Mg Tabs (Atorvastatin calcium) .... Take 1/2 tablet by mouth once a day 4)  Glucotrol Xl 5 Mg Tb24 (Glipizide) .... Take 1 tablet by mouth once a day 5)  Hyoscyamine Sulfate 0.125 Mg Subl (Hyoscyamine sulfate) .... Place 1 tablet under tongue once a day as needed 6)  Mobic 15 Mg Tabs (Meloxicam) .... Take 1 tablet by mouth once a day with food as needed 7)  Loratadine 10 Mg Tabs (Loratadine) .... Take one by mouth dialy 8)  Albuterol 90 Mcg/act Aers (Albuterol) .... 2 puffs up to every 4 hours as needed wheezing 9)  Advair Diskus 100-50 Mcg/dose Misc (Fluticasone-salmeterol) .Marland Kitchen.. 1 inhalation two times a day during allergy season 10)  Calcium 600 Mg Tabs (Calcium) .... Take 2 by mouth daily 11)  Onetouch Ultra Test Strp (Glucose  blood) .... Check sugar twice a day as instructed dm 250.0 that is not optimally controlled 12)  Metformin Hcl 1000 Mg Tabs (Metformin hcl) .Marland Kitchen.. 1 by mouth two times a day 13)  Multivitamins Tabs (Multiple vitamin) .... One daily 14)  Synthroid 25 Mcg Tabs (Levothyroxine sodium) .Marland Kitchen.. 1 once daily  Patient Instructions: 1)  Referral Appointment Information 2)  Day/Date: 3)  Time: 4)  Place/MD: 5)  Address: 6)  Phone/Fax: 7)  Patient given appointment information. Information/Orders faxed/mailed.  8)  f/u 6 weeks  Current Allergies (reviewed today): No known allergies

## 2010-10-05 NOTE — Assessment & Plan Note (Signed)
Summary: self referral for osa   Copy to:  self referral Primary Provider/Referring Provider:  Judith Part MD  CC:  Sleep Consult.  Former Pt.  Last seen by Dr. Shelle Iron 2007.  History of Present Illness: The pt is a 60 y/o female who I have been asked to see for osa.  She was last seen in 2007, and had a sleep study in 2006 which showed severe osa with AHI 66/hr and desat to 78%.  She was initially titrated to 19cm of cpap pressure, but later was able to come down to 13 with weight loss.  She has been compliant with cpap, but her machine is now making noises and leaks where the hose comes off the machine.  She thinks the pressure is not adequate enough at this time.  She uses a full face mask, and it is at least a year old and due for replacement.  She typically goes to bed at 10-11pm, and arises at 5:30am to start her day.  She feels that she is rested upon arising, and thinks her daytime alertness is ok but not normal.  Her weight is down 20 pounds over the last 2 years, and her epworth score today is 9.  Allergies (verified): No Known Drug Allergies  Past History:  Past Medical History: Allergic rhinitis Asthma (as child) GERD Hyperlipidemia (mild) Hypertension (borderline) Hypothyroidism Diabetes mellitus, type II (2/06 elevated microalbumin) obesity OSA   opthy- [facility]  Past Surgical History: Foot surgery Endometrial biopsy (1999) D&C - Fibroids (11/2001) Dexa (03/2002) Stress Cardiolyte (05/2003) 2-D Echo (05/2003) Holter- PVC's Colonoscopy (07/2003)- hemorroids--no polyp-- re check 10 years  Sleep Study (05/2005)--66/hr with desat to 78%  Family History: Reviewed history from 03/28/2008 and no changes required. M--CAD/ DM, rheumatism F-- alzheimers , kidney stones , rheumatism PGM Bca B CAD for bypass sx   Social History: Reviewed history from 03/28/2008 and no changes required. Never Smoked pt is married. pt works as a Runner, broadcasting/film/video.  occ alcohol    some walking for exercise   Review of Systems       The patient complains of nasal congestion/difficulty breathing through nose.  The patient denies shortness of breath with activity, shortness of breath at rest, productive cough, non-productive cough, coughing up blood, chest pain, irregular heartbeats, acid heartburn, indigestion, loss of appetite, weight change, abdominal pain, difficulty swallowing, sore throat, tooth/dental problems, headaches, sneezing, itching, ear ache, anxiety, depression, hand/feet swelling, joint stiffness or pain, rash, change in color of mucus, and fever.    Vital Signs:  Patient profile:   60 year old female Height:      64 inches Weight:      217 pounds BMI:     37.38 O2 Sat:      98 % on Room air Temp:     98.2 degrees F oral Pulse rate:   96 / minute BP sitting:   112 / 70  (left arm) Cuff size:   large  Vitals Entered By: Arman Filter LPN (Jan 29, 2010 2:31 PM)  O2 Flow:  Room air CC: Sleep Consult.  Former Pt.  Last seen by Dr. Shelle Iron 2007 Comments Medications reviewed with patient Arman Filter LPN  Jan 29, 2010 2:31 PM    Physical Exam  General:  ow female in nad Eyes:  PERRLA and EOMI.   Nose:  narrowed, no purulence or discharge Mouth:  mild elongation of soft palate and uvula. Neck:  no jvd, tmg, LN Lungs:  clear  to auscultation Heart:  rrr, no mrg Abdomen:  soft and nontender, bs+ Extremities:  no edema noted, pulses intact distally Neurologic:  alert and oriented, moves all 4.   Impression & Recommendations:  Problem # 1:  SLEEP APNEA, SEVERE (ICD-780.57) the pt has a h/o severe osa, and has been compliant with cpap therapy.  She also has lost weight, we a decrease in her pressure needs.  She is obviously due for a new cpap machine, and will take this opportunity to re-optimize her pressure, especially since she has lost some weight.  I have encouraged her to work on weight loss.  Care Plan:  At this point, will arrange for  the patient's machine to be changed over to auto mode for 2 weeks to optimize their pressure.  I will review the downloaded data once sent by dme, and also evaluate for compliance, leaks, and residual osa.  I will call the patient and dme to discuss the results, and have the patient's machine set appropriately.  This will serve as the pt's cpap pressure titration.  Other Orders: New Patient Level IV ([address]) DME Referral (DME)  Patient Instructions: 1)  will arrange for new cpap machine and mask 2)  will re-optimize your pressure on auto mode for 2 weeks, and let you know the results. 3)  work on further weight loss 4)  followup with me in one year.

## 2010-10-05 NOTE — Assessment & Plan Note (Signed)
Summary: 6 month follow upr/bh   Vital Signs:  Patient profile:   60 year old female Weight:      220 pounds BMI:     37.90 Temp:     98.5 degrees F oral Pulse rate:   84 / minute Pulse rhythm:   regular BP sitting:   124 / 80  (left arm) Cuff size:   large  Vitals Entered By: Lowella Petties CMA (September 28, 2009 4:12 PM) CC: 6 month follow up   History of Present Illness: here for f/u of HTN and lipids and DM and thyroid  had bad gastroenteritis -- is finally feeling better  rested over the weekend  husband had it too    wt is down 2 lb with bmi of 37  lipids are fairly stalbe with trig 125/ HDL 37 and LDL 78 is staying away from saturated fats for the most part - a few eggs a week   AIC 6.1-- up from 5.9 last time diet - is good but could be better -- does cheat with bread and sweets  opthy-august -- was fine -- , goes to [facility]  is not exercising -- regularly  wants to walk -- and has wi fit and also treadmill    tsh is low .10--unusual for her -- no recnet dose change  no palpitations or shakes or anx    HTN is in good control with bp 124/80 today     Allergies: No Known Drug Allergies  Past History:  Past Medical History: Last updated: 09/22/2008 Allergic rhinitis Asthma (as child) GERD Hyperlipidemia (mild) Hypertension (borderline) Hypothyroidism Diabetes mellitus, type II (2/06 elevated microalbumin) obesity   opthy- [facility]  Past Surgical History: Last updated: 03/28/2008 Foot surgery Endometrial biopsy (1999) D&C - Fibroids (11/2001) Dexa (03/2002) Stress Cardiolyte (05/2003) 2-D Echo (05/2003) Holter- PVC's Colonoscopy (07/2003)- hemorroids--no polyp-- re check 10 years  Sleep Study (05/2005)  Family History: Last updated: 03/28/2008 M--CAD/ DM F-- alzheimers , kidney stones  PGM Bca B CAD for bypass sx   Social History: Last updated: 03/28/2008 Never Smoked occ alcohol  some walking for exercise     Risk Factors: Smoking Status: never (11/13/2007)  Review of Systems General:  Denies fatigue, loss of appetite, and malaise. Eyes:  Denies blurring and eye irritation. CV:  Denies chest pain or discomfort and palpitations. Resp:  Denies cough, shortness of breath, and wheezing. GI:  Denies diarrhea, nausea, and vomiting. MS:  Denies muscle aches. Derm:  Denies itching, lesion(s), and rash. Neuro:  Denies numbness and tingling. Psych:  Denies anxiety and depression. Endo:  Denies cold intolerance, excessive thirst, and excessive urination. Heme:  Denies abnormal bruising and bleeding.  Physical Exam  General:  overweight but generally well appearing  Head:  normocephalic, atraumatic, and no abnormalities observed.   Eyes:  vision grossly intact, pupils equal, pupils round, and pupils reactive to light.   Mouth:  pharynx pink and moist.   Neck:  supple with full rom and no masses or thyromegally, no JVD or carotid bruit  Lungs:  normal respiratory effort, no intercostal retractions, no accessory muscle use, and normal breath sounds.   Heart:  normal rate, regular rhythm, and no murmur.   Abdomen:  Bowel sounds positive,abdomen soft and non-tender without masses, organomegaly or hernias noted. no renal bruits  Msk:  No deformity or scoliosis noted of thoracic or lumbar spine.  no acute joint changes Pulses:  R and L carotid,radial,femoral,dorsalis pedis and posterior  tibial pulses are full and equal bilaterally Extremities:  No clubbing, cyanosis, edema, or deformity noted with normal full range of motion of all joints.   Neurologic:  sensation intact to light touch, gait normal, and DTRs symmetrical and normal.   Skin:  Intact without suspicious lesions or rashes Cervical Nodes:  No lymphadenopathy noted Psych:  normal affect, talkative and pleasant    Impression & Recommendations:  Problem # 1:  DIABETES MELLITUS, TYPE II (ICD-250.00) Assessment Unchanged  overall stable  with good sugar control on current meds opthy is up to date  rev die t- needs to cut bread and sweets disc plan for 20 minutes of exercise daily  plan lab and f/u in 6 mo  Her updated medication list for this problem includes:    Monopril Hct 10-12.5 Mg Tabs (Fosinopril sodium-hctz) .Marland Kitchen... Take 1 tablet by mouth once a day    Glucotrol Xl 5 Mg Tb24 (Glipizide) .Marland Kitchen... Take 1 tablet by mouth once a day    Metformin Hcl 1000 Mg Tabs (Metformin hcl) .Marland Kitchen... 1 by mouth two times a day  Orders: Prescription Created Electronically [phone])  Problem # 2:  HYPOTHYROIDISM (ICD-244.9) Assessment: Deteriorated  need to cut dose for low tsh disc way/ time to take med cut to 88 micrograms- update if symptoms  lab 6 wk Her updated medication list for this problem includes:    Synthroid 88 Mcg Tabs (Levothyroxine sodium) .Marland Kitchen... 1 by mouth once daily  Labs Reviewed: TSH: 0.10 (09/21/2009)    HgBA1c: 6.1 (09/21/2009) Chol: 140 (09/21/2009)   HDL: 37.50 (09/21/2009)   LDL: 78 (09/21/2009)   TG: 125.0 (09/21/2009)  Orders: Prescription Created Electronically [phone])  Problem # 3:  HYPERTENSION (ICD-401.9) Assessment: Unchanged  very good control with current med enc to start exercise  Her updated medication list for this problem includes:    Monopril Hct 10-12.5 Mg Tabs (Fosinopril sodium-hctz) .Marland Kitchen... Take 1 tablet by mouth once a day  BP today: 124/80 Prior BP: 110/70 (08/06/2009)  Labs Reviewed: K+: 4.4 (09/21/2009) Creat: : 0.7 (09/21/2009)   Chol: 140 (09/21/2009)   HDL: 37.50 (09/21/2009)   LDL: 78 (09/21/2009)   TG: 125.0 (09/21/2009)  Orders: Prescription Created Electronically [phone])  Problem # 4:  HYPERLIPIDEMIA (ICD-272.4) Assessment: Unchanged  overall fair control - almost to goal  rev diet - asked to swap whole eggs for whites / egg beaters  lab and f/u 6 mo  Her updated medication list for this problem includes:    Lipitor 20 Mg Tabs (Atorvastatin calcium) .Marland Kitchen... Take 1/2  tablet by mouth once a day  Labs Reviewed: SGOT: 22 (09/21/2009)   SGPT: 22 (09/21/2009)   HDL:37.50 (09/21/2009), 38.60 (03/23/2009)  LDL:78 (09/21/2009), 73 (03/23/2009)  Chol:140 (09/21/2009), 141 (03/23/2009)  Trig:125.0 (09/21/2009), 149.0 (03/23/2009)  Orders: Prescription Created Electronically [phone])  Complete Medication List: 1)  Flexeril 10 Mg Tabs (Cyclobenzaprine hcl) .... Take 1 tablet by mouth three times a day as needed 2)  Monopril Hct 10-12.5 Mg Tabs (Fosinopril sodium-hctz) .... Take 1 tablet by mouth once a day 3)  Lipitor 20 Mg Tabs (Atorvastatin calcium) .... Take 1/2 tablet by mouth once a day 4)  Glucotrol Xl 5 Mg Tb24 (Glipizide) .... Take 1 tablet by mouth once a day 5)  Hyoscyamine Sulfate 0.125 Mg Subl (Hyoscyamine sulfate) .... Place 1 tablet under tongue once a day as needed 6)  Mobic 15 Mg Tabs (Meloxicam) .... Take 1 tablet by mouth once a day with food as needed 7)  Synthroid 88 Mcg Tabs (Levothyroxine sodium) .Marland Kitchen.. 1 by mouth once daily 8)  Loratadine 10 Mg Tabs (Loratadine) .... Take one by mouth dialy 9)  Albuterol 90 Mcg/act Aers (Albuterol) .... 2 puffs up to every 4 hours as needed wheezing 10)  Advair Diskus 100-50 Mcg/dose Misc (Fluticasone-salmeterol) .Marland Kitchen.. 1 inhalation two times a day during allergy season 11)  Calcium 600 Mg Tabs (Calcium) .... Take 2 by mouth daily 12)  Onetouch Ultra Test Strp (Glucose blood) .... Check sugar twice a day as instructed dm 250.0 that is not optimally controlled 13)  Metformin Hcl 1000 Mg Tabs (Metformin hcl) .Marland Kitchen.. 1 by mouth two times a day 14)  Multivitamins Tabs (Multiple vitamin) .... One daily  Patient Instructions: 1)  schedule non fasting labs in 6 weeks tsh and free t4- 244.9 2)  schedule fasting labs in 6  months lipid/ast/alt / renal / tsh/ AIC/ microalb 250.0, 244.9, 272 and then follow up  3)  work hard on diet and exercise -- at least 20 minutes per day  Prescriptions: SYNTHROID 88 MCG TABS  (LEVOTHYROXINE SODIUM) 1 by mouth once daily  #30 x 5   Entered and Authorized by:   Judith Part MD   Signed by:   Judith Part MD on 09/28/2009   Method used:   Electronically to        [facility] [address]* (retail)       [address] [place], [place]  [address]       Ph: [phone]       Fax: [phone]   RxID:   [id number]   Prior Medications (reviewed today): FLEXERIL 10 MG TABS (CYCLOBENZAPRINE HCL) Take 1 tablet by mouth three times a day as needed MONOPRIL HCT 10-12.5 MG TABS (FOSINOPRIL SODIUM-HCTZ) Take 1 tablet by mouth once a day LIPITOR 20 MG TABS (ATORVASTATIN CALCIUM) Take 1/2 tablet by mouth once a day GLUCOTROL XL 5 MG TB24 (GLIPIZIDE) Take 1 tablet by mouth once a day HYOSCYAMINE SULFATE 0.125 MG SUBL (HYOSCYAMINE SULFATE) Place 1 tablet under tongue once a day as needed MOBIC 15 MG TABS (MELOXICAM) Take 1 tablet by mouth once a day with food as needed SYNTHROID 88 MCG TABS (LEVOTHYROXINE SODIUM) 1 by mouth once daily LORATADINE 10 MG TABS (LORATADINE) Take one by mouth dialy ALBUTEROL 90 MCG/ACT AERS (ALBUTEROL) 2 puffs up to every 4 hours as needed wheezing ADVAIR DISKUS 100-50 MCG/DOSE MISC (FLUTICASONE-SALMETEROL) 1 inhalation two times a day during allergy season CALCIUM 600 MG TABS (CALCIUM) Take 2 by mouth daily ONETOUCH ULTRA TEST   STRP (GLUCOSE BLOOD) CHECK SUGAR TWICE A DAY AS INSTRUCTED DM 250.0 that is not optimally controlled METFORMIN HCL 1000 MG TABS (METFORMIN HCL) 1 by mouth two times a day MULTIVITAMINS   TABS (MULTIPLE VITAMIN) one daily Current Allergies: No known allergies

## 2010-10-05 NOTE — Assessment & Plan Note (Signed)
Summary: NEW ENDO CONSULT/ HYPOTHYROID/NWS   Vital Signs:  Patient profile:   60 year old female Height:      64 inches (162.56 cm) Weight:      223 pounds (101.36 kg) BMI:     38.42 O2 Sat:      98 % on Room air Temp:     98.2 degrees F (36.78 degrees C) oral Pulse rate:   89 / minute Pulse rhythm:   regular BP sitting:   124 / 72  (left arm) Cuff size:   large  Vitals Entered By: Brenton Grills MA (March 09, 2010 2:29 PM)  O2 Flow:  Room air CC: New Endo pt/hypothyroidsim-Dr. Tower/aj   Referring Provider:  self referral Primary Provider:  Judith Part MD  CC:  New Endo pt/hypothyroidsim-Dr. Tower/aj.  History of Present Illness: pt was dx'ed with hypothyroidism approx 15-20 years ago.  she has been on synthroid since then.  for the past 6 mos, her dosage requirement has been steadily decreasing.  she is uncertain why this is.   symptomatically, pt states few years of mild intermittent numbness of the left leg.  more recently, she has associated heat intolerance.  Current Medications (verified): 1)  Flexeril 10 Mg Tabs (Cyclobenzaprine Hcl) .... Take 1 Tablet By Mouth Three Times A Day As Needed 2)  Monopril Hct 10-12.5 Mg Tabs (Fosinopril Sodium-Hctz) .... Take 1 Tablet By Mouth Once A Day 3)  Lipitor 20 Mg Tabs (Atorvastatin Calcium) .... Take 1/2 Tablet By Mouth Once A Day 4)  Glucotrol Xl 5 Mg Tb24 (Glipizide) .... Take 1 Tablet By Mouth Once A Day 5)  Hyoscyamine Sulfate 0.125 Mg Subl (Hyoscyamine Sulfate) .... Place 1 Tablet Under Tongue Once A Day As Needed 6)  Mobic 15 Mg Tabs (Meloxicam) .... Take 1 Tablet By Mouth Once A Day With Food As Needed 7)  Loratadine 10 Mg Tabs (Loratadine) .... Take One By Mouth Dialy 8)  Albuterol 90 Mcg/act Aers (Albuterol) .... 2 Puffs Up To Every 4 Hours As Needed Wheezing 9)  Advair Diskus 100-50 Mcg/dose Misc (Fluticasone-Salmeterol) .Marland Kitchen.. 1 Inhalation Two Times A Day During Allergy Season 10)  Calcium 600 Mg Tabs (Calcium) ....  Take 2 By Mouth Daily 11)  Onetouch Ultra Test   Strp (Glucose Blood) .... Check Sugar Twice A Day As Instructed Dm 250.0 That Is Not Optimally Controlled 12)  Metformin Hcl 1000 Mg Tabs (Metformin Hcl) .Marland Kitchen.. 1 By Mouth Two Times A Day 13)  Multivitamins   Tabs (Multiple Vitamin) .... One Daily 14)  Synthroid 50 Mcg Tabs (Levothyroxine Sodium) .... Take 1 Tablet By Mouth Once A Day 15)  Cipro 500 Mg Tabs (Ciprofloxacin Hcl) .... 1/2 Tablet As Directed Once Daily As Needed  Allergies (verified): No Known Drug Allergies  Past History:  Past Medical History: Last updated: 01/29/2010 Allergic rhinitis Asthma (as child) GERD Hyperlipidemia (mild) Hypertension (borderline) Hypothyroidism Diabetes mellitus, type II (2/06 elevated microalbumin) obesity OSA   opthy- [facility]  Family History: Reviewed history from 01/29/2010 and no changes required. M--CAD/ DM, rheumatism F-- alzheimers , kidney stones , rheumatism PGM Bca B CAD for bypass signs mother had hypothyroidism brother has hypothyroidism   Social History: Reviewed history from 01/29/2010 and no changes required. Never Smoked pt is married. pt works as a Runner, broadcasting/film/video.  occ alcohol  some walking for exercise.  Review of Systems       denies depression, hair loss, cramps, sob, weight gain, memory loss, constipation, blurry  vision, myalgias, dry skin, syncope.  she has slight doe in am.   Physical Exam  General:  obese.  no distress  Head:  head: no deformity eyes: no periorbital swelling, no proptosis external nose and ears are normal mouth: no lesion seen Neck:  Supple without thyroid enlargement or tenderness.  Lungs:  Clear to auscultation bilaterally. Normal respiratory effort.  Heart:  Regular rate and rhythm without murmurs or gallops noted. Normal S1,S2.   Abdomen:  abdomen is soft, nontender.  no hepatosplenomegaly.   not distended.  no hernia  Msk:  muscle bulk and strength are grossly normal.   no obvious joint swelling.  gait is normal and steady  Pulses:  dorsalis pedis intact bilat.  no carotid bruit  Extremities:  no deformity.  no ulcer on the feet.  feet are of normal color and temp.  no edema  Neurologic:  cn 2-12 grossly intact.   readily moves all 4's.   sensation is intact to touch on the feet  Skin:  normal texture and temp.  no rash.  not diaphoretic  Cervical Nodes:  No significant adenopathy.  Psych:  Alert and cooperative; normal mood and affect; normal attention span and concentration.     Impression & Recommendations:  Problem # 1:  HYPOTHYROIDISM (ICD-244.9) FT4: 1.0 (02/03/2010)   TSH: 0.10 (02/03/2010)     overreplaced it is uncertain why her synthroid requirements are decreasing  Problem # 2:  POSTMENOPAUSAL STATUS (ICD-627.2) this can limit interpretation of sxs  Problem # 3:  numbness very unlikely related to #1  Medications Added to Medication List This Visit: 1)  Synthroid 25 Mcg Tabs (Levothyroxine sodium) .Marland Kitchen.. 1 once daily  Other Orders: Radiology Referral (Radiology) Consultation Level IV [phone])  Patient Instructions: 1)  reduce synthroid to 25 micrograms/day 2)  in 1 month, go to lab for tsh 244.9.  please call [phone] to hear your test results. 3)  check thyroid ultrasound.  you will be called with a day and time for an appointment. Prescriptions: SYNTHROID 25 MCG TABS (LEVOTHYROXINE SODIUM) 1 once daily  #30 x 11   Entered and Authorized by:   Minus Breeding MD   Signed by:   Minus Breeding MD on 03/09/2010   Method used:   Electronically to        [facility] [address] (retail)       [address] [place], [place]  [address]       Ph: [phone]       Fax: [phone]   RxID:   [id number]

## 2010-10-05 NOTE — Assessment & Plan Note (Signed)
Summary: CPX... CYD   Vital Signs:  Patient profile:   60 year old female Height:      64 inches Weight:      221.75 pounds BMI:     38.20 Temp:     98.6 degrees F oral Pulse rate:   84 / minute Pulse rhythm:   regular BP sitting:   124 / 80  (left arm) Cuff size:   large  Vitals Entered By: Lewanda Rife LPN (April 06, 2010 9:39 AM) CC: CPX LMP?   History of Present Illness: here for health mt exam and to rev chronic med problem very busy summer -- doing ok and feeling good   wt is down 2 lb   bmi 38  is really working on it -- watching diet and not eating snacks   124/80- good bp   lipids are stable with trig 248, HDL 32 and LDL 78  thyroid low tsh still -- will f/u with Dr Everardo All-- labs 2 weeks   DM2 is better withAIC 6.6 from above 7 opthy is scheduled  nl pap 3/08 is due for that today  some proph abx after intercourse for utis   mam 8/10-- want to set that up  no lumps or changes on self exam    colon 04- due in 10 years  TD 2011 pTx in 2010  nl dexa in 09  is taking ca and vit D   opthy sched next week - Dr Jerilynn Birkenhead- was 1 y ago nl  shoulders hurt from moving furniture     Allergies (verified): No Known Drug Allergies  Past History:  Past Surgical History: Last updated: 01/29/2010 Foot surgery Endometrial biopsy (1999) D&C - Fibroids (11/2001) Dexa (03/2002) Stress Cardiolyte (05/2003) 2-D Echo (05/2003) Holter- PVC's Colonoscopy (07/2003)- hemorroids--no polyp-- re check 10 years  Sleep Study (05/2005)--66/hr with desat to 78%  Family History: Last updated: 03/09/2010 M--CAD/ DM, rheumatism F-- alzheimers , kidney stones , rheumatism PGM Bca B CAD for bypass signs mother had hypothyroidism brother has hypothyroidism   Social History: Last updated: 03/09/2010 Never Smoked pt is married. pt works as a Runner, broadcasting/film/video.  occ alcohol  some walking for exercise.  Risk Factors: Smoking Status: never (11/13/2007)  Past Medical  History: Allergic rhinitis Asthma (as child) GERD Hyperlipidemia (mild) Hypertension (borderline) Hypothyroidism Diabetes mellitus, type II (2/06 elevated microalbumin) obesity OSA frequent utis with coital prophylaxis   opthy- [facility]  Review of Systems General:  Denies fatigue, loss of appetite, and malaise. Eyes:  Denies blurring and eye irritation. CV:  Denies chest pain or discomfort, palpitations, and shortness of breath with exertion. Resp:  Denies cough, shortness of breath, and wheezing. GI:  Denies abdominal pain, change in bowel habits, indigestion, and nausea. GU:  Complains of urinary frequency; denies abnormal vaginal bleeding, discharge, dysuria, and hematuria. MS:  Denies joint pain, low back pain, and mid back pain. Derm:  Denies itching, lesion(s), poor wound healing, and rash. Neuro:  Denies headaches, numbness, and tingling. Psych:  mood is ok . Endo:  Denies cold intolerance, excessive thirst, excessive urination, and heat intolerance. Heme:  Denies abnormal bruising and bleeding.  Physical Exam  General:  overweight but generally well appearing  Head:  normocephalic, atraumatic, and no abnormalities observed.   Eyes:  vision grossly intact, pupils equal, pupils round, and pupils reactive to light.  no conjunctival pallor, injection or icterus  Ears:  R ear normal and L ear normal.   Nose:  no nasal  discharge.   Mouth:  pharynx pink and moist.   Neck:  supple with full rom and no masses or thyromegally, no JVD or carotid bruit  Chest Wall:  No deformities, masses, or tenderness noted. Breasts:  No mass, nodules, thickening, tenderness, bulging, retraction, inflamation, nipple discharge or skin changes noted.   Lungs:  Normal respiratory effort, chest expands symmetrically. Lungs are clear to auscultation, no crackles or wheezes. Heart:  normal rate, regular rhythm, and no murmur.   Abdomen:  Bowel sounds positive,abdomen soft and non-tender  without masses, organomegaly or hernias noted. no renal bruits  Genitalia:  Normal introitus for age, no external lesions, no vaginal discharge, mucosa pink and moist, no vaginal or cervical lesions, no vaginal atrophy, no friaility or hemorrhage, normal uterus size and position, no adnexal masses or tenderness Msk:  No deformity or scoliosis noted of thoracic or lumbar spine.  no acute joint changes  Pulses:  R and L carotid,radial,femoral,dorsalis pedis and posterior tibial pulses are full and equal bilaterally Extremities:  No clubbing, cyanosis, edema, or deformity noted with normal full range of motion of all joints.   Neurologic:  sensation intact to light touch, gait normal, and DTRs symmetrical and normal.   Skin:  Intact without suspicious lesions or rashes Cervical Nodes:  No lymphadenopathy noted Axillary Nodes:  No palpable lymphadenopathy Inguinal Nodes:  No significant adenopathy Psych:  normal affect, talkative and pleasant   Diabetes Management Exam:    Foot Exam (with socks and/or shoes not present):       Sensory-Pinprick/Light touch:          Left medial foot (L-4): normal          Left dorsal foot (L-5): normal          Left lateral foot (S-1): normal          Right medial foot (L-4): normal          Right dorsal foot (L-5): normal          Right lateral foot (S-1): normal       Sensory-Monofilament:          Left foot: normal          Right foot: normal       Inspection:          Left foot: normal          Right foot: normal       Nails:          Left foot: normal          Right foot: normal   Impression & Recommendations:  Problem # 1:  HEALTH MAINTENANCE EXAM (ICD-V70.0) Assessment Comment Only reviewed health habits including diet, exercise and skin cancer prevention reviewed health maintenance list and family history rev wellness labs in detail  will plan on dexa in another year -last one neg rev ca and vit D utd colonosc   Problem # 2:  ROUTINE  GYNECOLOGICAL EXAMINATION (ICD-V72.31) Assessment: Comment Only annual exam/ pap done  Problem # 3:  DIABETES MELLITUS, TYPE II (ICD-250.00) Assessment: Improved is improving on current med and diet  opthy planned next week disc healthy diet (low simple sugar/ choose complex carbs/ low sat fat) diet and exercise in detail  Her updated medication list for this problem includes:    Monopril Hct 10-12.5 Mg Tabs (Fosinopril sodium-hctz) .Marland Kitchen... Take 1 tablet by mouth once a day    Glucotrol Xl 5 Mg Tb24 (Glipizide) .Marland Kitchen... Take 1  tablet by mouth once a day    Metformin Hcl 1000 Mg Tabs (Metformin hcl) .Marland Kitchen... 1 by mouth two times a day  Labs Reviewed: Creat: 0.8 (03/16/2010)     Last Eye Exam: normal (04/05/2009) Reviewed HgBA1c results: 6.6 (03/16/2010)  7.2 (02/03/2010)  Problem # 4:  HYPOTHYROIDISM (ICD-244.9) Assessment: Unchanged  low tsh- for f/u Dr Everardo All soon no clinical changes Her updated medication list for this problem includes:    Synthroid 25 Mcg Tabs (Levothyroxine sodium) .Marland Kitchen... 1 once daily  Labs Reviewed: TSH: 0.29 (03/16/2010)    HgBA1c: 6.6 (03/16/2010) Chol: 144 (03/16/2010)   HDL: 32.70 (03/16/2010)   LDL: 78 (09/21/2009)   TG: 248.0 (03/16/2010)  Problem # 5:  HYPERTENSION (ICD-401.9) Assessment: Unchanged good control without med change  enc good diet and exercise  Her updated medication list for this problem includes:    Monopril Hct 10-12.5 Mg Tabs (Fosinopril sodium-hctz) .Marland Kitchen... Take 1 tablet by mouth once a day  BP today: 124/80 Prior BP: 124/72 (03/09/2010)  Labs Reviewed: K+: 3.8 (03/16/2010) Creat: : 0.8 (03/16/2010)   Chol: 144 (03/16/2010)   HDL: 32.70 (03/16/2010)   LDL: 78 (09/21/2009)   TG: 248.0 (03/16/2010)  Problem # 6:  HYPERLIPIDEMIA (ICD-272.4) Assessment: Unchanged  is fairly controlled trig up from sugar disc healthy diet (low simple sugar/ choose complex carbs/ low sat fat) diet and exercise in detail  Her updated medication list  for this problem includes:    Lipitor 20 Mg Tabs (Atorvastatin calcium) .Marland Kitchen... Take 1/2 tablet by mouth once a day  Labs Reviewed: SGOT: 20 (03/16/2010)   SGPT: 19 (03/16/2010)   HDL:32.70 (03/16/2010), 37.50 (09/21/2009)  LDL:78 (09/21/2009), 73 (03/23/2009)  Chol:144 (03/16/2010), 140 (09/21/2009)  Trig:248.0 (03/16/2010), 125.0 (09/21/2009)  Problem # 7:  OTHER SCREENING MAMMOGRAM (ICD-V76.12) Assessment: Comment Only  annual mammogram scheduled adv pt to continue regular self breast exams non remarkable breast exam today   Orders: Radiology Referral (Radiology)  Complete Medication List: 1)  Flexeril 10 Mg Tabs (Cyclobenzaprine hcl) .... Take 1 tablet by mouth three times a day as needed 2)  Monopril Hct 10-12.5 Mg Tabs (Fosinopril sodium-hctz) .... Take 1 tablet by mouth once a day 3)  Lipitor 20 Mg Tabs (Atorvastatin calcium) .... Take 1/2 tablet by mouth once a day 4)  Glucotrol Xl 5 Mg Tb24 (Glipizide) .... Take 1 tablet by mouth once a day 5)  Hyoscyamine Sulfate 0.125 Mg Subl (Hyoscyamine sulfate) .... Place 1 tablet under tongue once a day as needed 6)  Mobic 15 Mg Tabs (Meloxicam) .... Take 1 tablet by mouth once a day with food as needed 7)  Loratadine 10 Mg Tabs (Loratadine) .... Take one by mouth dialy 8)  Albuterol 90 Mcg/act Aers (Albuterol) .... 2 puffs up to every 4 hours as needed wheezing 9)  Advair Diskus 100-50 Mcg/dose Misc (Fluticasone-salmeterol) .Marland Kitchen.. 1 inhalation two times a day during allergy season 10)  Calcium 600 Mg Tabs (Calcium) .... Take 2 by mouth daily 11)  Onetouch Ultra Test Strp (Glucose blood) .... Check sugar twice a day as instructed dm 250.0 that is not optimally controlled 12)  Metformin Hcl 1000 Mg Tabs (Metformin hcl) .Marland Kitchen.. 1 by mouth two times a day 13)  Multivitamins Tabs (Multiple vitamin) .... One daily 14)  Synthroid 25 Mcg Tabs (Levothyroxine sodium) .Marland Kitchen.. 1 once daily  Patient Instructions: 1)  keep working on healthy diet and  exercise  2)  follow up wtih Dr Everardo All as planned 3)  we will sched  mammogram at check out  4)  schedule appt with Dr Patsy Lager at check out for shoulder pain  5)  schedule fasting lab in 6 months lipid/ast/alt/AIC/ renal  250.0, 272 and then follow up  Prescriptions: METFORMIN HCL 1000 MG TABS (METFORMIN HCL) 1 by mouth two times a day  #60 x 11   Entered and Authorized by:   Judith Part MD   Signed by:   Judith Part MD on 04/06/2010   Method used:   Electronically to        [facility] [address] (retail)       [address] [place], [place]  [address]       Ph: [phone]       Fax: [phone]   RxID:   [id number] MOBIC 15 MG TABS (MELOXICAM) Take 1 tablet by mouth once a day with food as needed  #30 x 11   Entered and Authorized by:   Judith Part MD   Signed by:   Judith Part MD on 04/06/2010   Method used:   Electronically to        [facility] [address]* (retail)       [address] [place], [place]  [address]       Ph: [phone]       Fax: [phone]   RxID:   [id number] HYOSCYAMINE SULFATE 0.125 MG SUBL (HYOSCYAMINE SULFATE) Place 1 tablet under tongue once a day as needed  #30 x 0   Entered and Authorized by:   Judith Part MD   Signed by:   Judith Part MD on 04/06/2010   Method used:   Electronically to        [facility] [address]* (retail)       [address] [place], [place]  [address]       Ph: [phone]       Fax: [phone]   RxID:   [id number] GLUCOTROL XL 5 MG TB24 (GLIPIZIDE) Take 1 tablet by mouth once a day  #30 x 11   Entered and Authorized by:   Judith Part MD   Signed by:   Judith Part MD on 04/06/2010   Method used:   Electronically to        [facility] [address]* (retail)       [address] [place], [place]  [address]       Ph: [phone]        Fax: [phone]   RxID:   [id number] LIPITOR 20 MG TABS (ATORVASTATIN CALCIUM) Take 1/2 tablet by mouth once a day  #15 x 11   Entered and Authorized by:   Judith Part MD   Signed by:   Judith Part MD on 04/06/2010   Method used:   Electronically to        [facility] [facility] [address]* (retail)       [address]       [place], [place]  [address]       Ph:  [phone]       Fax: [phone]   RxID:   [id number] MONOPRIL HCT 10-12.5 MG TABS (FOSINOPRIL SODIUM-HCTZ) Take 1 tablet by mouth once a day  #30 x 11   Entered and Authorized by:   Judith Part MD   Signed by:   Judith Part MD on 04/06/2010   Method used:   Electronically to        [facility] [address] (retail)       [address] [place], [place]  [address]       Ph: [phone]       Fax: [phone]   RxID:   [id number]   Current Allergies (reviewed today): No known allergies    Immunization History:  Tetanus/Td Immunization History:    Tetanus/Td:  historical (03/31/2010)

## 2010-10-05 NOTE — Assessment & Plan Note (Signed)
Summary: RIGHT HIP,LEG PAIN/CLE   Vital Signs:  Patient profile:   60 year old female Height:      64 inches Weight:      231.75 pounds BMI:     39.92 Temp:     97.9 degrees F oral Pulse rate:   76 / minute Pulse rhythm:   regular BP sitting:   114 / 70  (left arm) Cuff size:   large  Vitals Entered By: Delilah Shan CMA Duncan Dull) (July 05, 2010 2:02 PM) CC: Right hip and leg pain   History of Present Illness: R hip/R leg.  Friday was walking home and stepped on shoe string.  "I pulled something in my R leg."  No pop or snap. Pain at that point.  Able to walk.  Pain with stairs, more than flat ground- worse going up.  Pain radiates down R lateral portion of leg.  No posterior symptoms.  Some better with sleeping on back.  Has been taking mobic and flexeril.  More relief with flexeril than mobic.    Allergies: No Known Drug Allergies  Review of Systems       See HPI.  Otherwise negative.    Physical Exam  General:  NAD, walks with limp RRR CTAB no pain in R hip with int/ext rotation.  R SLR neg.  not tender to palpation on R troch bursa.  distally nv intact.  pain with ITB testing (standing with all weight on R leg).  ITB diffusely tender to palpation.  No R medial knee pain.    Impression & Recommendations:  Problem # 1:  LEG PAIN, RIGHT (ICD-729.5) R ITB strain.  D/w patient [id number] tx.  Continue mobic and flexeril as needed.  Sedation caution for vicodin.  This should gradually improve.  No indication for imaging.   Complete Medication List: 1)  Flexeril 10 Mg Tabs (Cyclobenzaprine hcl) .... Take 1 tablet by mouth three times a day as needed 2)  Monopril Hct 10-12.5 Mg Tabs (Fosinopril sodium-hctz) .... Take 1 tablet by mouth once a day 3)  Lipitor 20 Mg Tabs (Atorvastatin calcium) .... Take 1/2 tablet by mouth once a day 4)  Glucotrol Xl 5 Mg Tb24 (Glipizide) .... Take 1 tablet by mouth once a day 5)  Hyoscyamine Sulfate 0.125 Mg Subl (Hyoscyamine sulfate) ....  Place 1 tablet under tongue once a day as needed 6)  Mobic 15 Mg Tabs (Meloxicam) .... Take 1 tablet by mouth once a day with food as needed 7)  Loratadine 10 Mg Tabs (Loratadine) .... Take one by mouth dialy 8)  Albuterol 90 Mcg/act Aers (Albuterol) .... 2 puffs up to every 4 hours as needed wheezing 9)  Advair Diskus 100-50 Mcg/dose Misc (Fluticasone-salmeterol) .Marland Kitchen.. 1 inhalation two times a day during allergy season 10)  Calcium 600 Mg Tabs (Calcium) .... Take 2 by mouth daily 11)  Onetouch Ultra Test Strp (Glucose blood) .... Check sugar twice a day as instructed dm 250.0 that is not optimally controlled 12)  Metformin Hcl 1000 Mg Tabs (Metformin hcl) .Marland Kitchen.. 1 by mouth two times a day 13)  Multivitamins Tabs (Multiple vitamin) .... One daily 14)  Synthroid 50 Mcg Tabs (Levothyroxine sodium) .Marland Kitchen.. 1 tab once daily 15)  Vicodin 5-500 Mg Tabs (Hydrocodone-acetaminophen) .... 1/2 to 1 tab by mouth three times a day as needed for pain.  Patient Instructions: 1)  You likely strained your ITB (a big muscle group on the outside of your leg).  This should gradually get better.  I would ease back into more walking as your pain level gets better.  Take the vicodin as needed; it may make you drowsy.  I would use ice in the meantime and continue the mobic/flexeril as needed.  Prescriptions: VICODIN 5-500 MG TABS (HYDROCODONE-ACETAMINOPHEN) 1/2 to 1 tab by mouth three times a day as needed for pain.  #30 x 0   Entered and Authorized by:   Crawford Givens MD   Signed by:   Crawford Givens MD on 07/05/2010   Method used:   Print then Give to Patient   RxID:   [id number]    Orders Added: 1)  Est. Patient Level III [56213]    Current Allergies (reviewed today): No known allergies

## 2010-10-05 NOTE — Assessment & Plan Note (Signed)
Summary: FOLLOW UP THYROID/RI   Vital Signs:  Patient profile:   60 year old female Height:      64 inches Weight:      223.75 pounds BMI:     38.55 Temp:     98.5 degrees F oral Pulse rate:   88 / minute Pulse rhythm:   regular BP sitting:   122 / 74  (left arm) Cuff size:   large  Vitals Entered By: Lewanda Rife LPN (February 03, 1609 3:29 PM) CC: f/u thyroid   History of Present Illness: here for thyroid f/u  last labs in april -- tsh low so dec dose to 50 micrograms daily feels ok overall on this dose  is tired because of schedule - nothing new   had a hot flash last night   wt is up 2 lb  bp 122/74  due for [facility] -- was good in jan 6.1 thinks sugars are the same - no highs or lows  opthy -- nl in aug at [facility]   recurrent utis  needs abx to take after intercourse  her uti was e coli and resistant to sulfa -- so that means to be changed to something else  utis correlated with intercourse last 2 times   Allergies (verified): No Known Drug Allergies  Past History:  Past Medical History: Last updated: 09/22/2008 Allergic rhinitis Asthma (as child) GERD Hyperlipidemia (mild) Hypertension (borderline) Hypothyroidism Diabetes mellitus, type II (2/06 elevated microalbumin) obesity   opthy- [facility]  Past Surgical History: Last updated: 03/28/2008 Foot surgery Endometrial biopsy (1999) D&C - Fibroids (11/2001) Dexa (03/2002) Stress Cardiolyte (05/2003) 2-D Echo (05/2003) Holter- PVC's Colonoscopy (07/2003)- hemorroids--no polyp-- re check 10 years  Sleep Study (05/2005)  Family History: Last updated: 03/28/2008 M--CAD/ DM F-- alzheimers , kidney stones  PGM Bca B CAD for bypass sx   Social History: Last updated: 03/28/2008 Never Smoked occ alcohol  some walking for exercise   Risk Factors: Smoking Status: never (11/13/2007)  Review of Systems General:  Complains of fatigue; denies fever, loss of appetite, malaise, and  weight loss. Eyes:  Denies blurring and eye irritation. CV:  Denies chest pain or discomfort and palpitations. Resp:  Denies cough and wheezing. GI:  Denies abdominal pain, change in bowel habits, indigestion, and loss of appetite. GU:  Complains of urinary frequency; denies dysuria and hematuria; uti is currently resolved . MS:  Denies low back pain. Derm:  Denies itching, lesion(s), poor wound healing, and rash. Neuro:  Denies numbness and tingling. Psych:  mood is ok  lot of stress . Endo:  Denies cold intolerance, excessive thirst, excessive urination, and heat intolerance. Heme:  Denies abnormal bruising and bleeding.  Physical Exam  General:  overweight but generally well appearing  Head:  normocephalic, atraumatic, and no abnormalities observed.   Eyes:  vision grossly intact, pupils equal, pupils round, and pupils reactive to light.   Mouth:  pharynx pink and moist.   Neck:  supple with full rom and no masses or thyromegally, no JVD or carotid bruit  Lungs:  normal respiratory effort, no intercostal retractions, no accessory muscle use, and normal breath sounds.   Heart:  normal rate, regular rhythm, and no murmur.   Abdomen:  No suprapubic tenderness NO CVA tenderness Msk:  No deformity or scoliosis noted of thoracic or lumbar spine.  no acute joint changes Pulses:  R and L carotid,radial,femoral,dorsalis pedis and posterior tibial pulses are full and equal bilaterally Extremities:  No clubbing, cyanosis, edema, or deformity noted with normal full range of motion of all joints.   Neurologic:  sensation intact to light touch, gait normal, and DTRs symmetrical and normal.   Skin:  linear erythematous plaques on left forearm, bilateral hips, thighs, calfs, upper arms (some as long as 8 inches). Cervical Nodes:  No lymphadenopathy noted Inguinal Nodes:  No significant adenopathy Psych:  normal affect, talkative and pleasant   Diabetes Management Exam:    Foot Exam (with socks  and/or shoes not present):       Sensory-Pinprick/Light touch:          Left medial foot (L-4): normal          Left dorsal foot (L-5): normal          Left lateral foot (S-1): normal          Right medial foot (L-4): normal          Right dorsal foot (L-5): normal          Right lateral foot (S-1): normal       Sensory-Monofilament:          Left foot: normal          Right foot: normal       Inspection:          Left foot: normal          Right foot: normal       Nails:          Left foot: normal          Right foot: normal    Eye Exam:       Eye Exam done elsewhere          Date: 04/05/2009          Results: normal          Done by: [facility]   Impression & Recommendations:  Problem # 1:  DIABETES MELLITUS, TYPE II (ICD-250.00) Assessment Unchanged  check labs today expect stability no change in meds  enc pt to work on wt loss  Her updated medication list for this problem includes:    Monopril Hct 10-12.5 Mg Tabs (Fosinopril sodium-hctz) .Marland Kitchen... Take 1 tablet by mouth once a day    Glucotrol Xl 5 Mg Tb24 (Glipizide) .Marland Kitchen... Take 1 tablet by mouth once a day    Metformin Hcl 1000 Mg Tabs (Metformin hcl) .Marland Kitchen... 1 by mouth two times a day  Orders: Venipuncture (47829) TLB-TSH (Thyroid Stimulating Hormone) (84443-TSH) TLB-T4 (Thyrox), Free [phone]) TLB-T3 Uptake (84479-T3UP) TLB-A1C / Hgb A1C (Glycohemoglobin) (83036-A1C)  Labs Reviewed: Creat: 0.7 (09/21/2009)     Last Eye Exam: normal (04/05/2009) Reviewed HgBA1c results: 6.1 (09/21/2009)  5.9 (03/23/2009)  Problem # 2:  HYPOTHYROIDISM (ICD-244.9) Assessment: Deteriorated  check tsh on adjusted dose no clinical changes  will update  Her updated medication list for this problem includes:    Synthroid 50 Mcg Tabs (Levothyroxine sodium) .Marland Kitchen... Take 1 tablet by mouth once a day  Orders: Venipuncture (78469) TLB-TSH (Thyroid Stimulating Hormone) (84443-TSH) TLB-T4 (Thyrox), Free [phone]) TLB-T3  Uptake (84479-T3UP) TLB-A1C / Hgb A1C (Glycohemoglobin) (83036-A1C)  Labs Reviewed: TSH: 0.10 (12/17/2009)    HgBA1c: 6.1 (09/21/2009) Chol: 140 (09/21/2009)   HDL: 37.50 (09/21/2009)   LDL: 78 (09/21/2009)   TG: 125.0 (09/21/2009)  Problem # 3:  UTI'S, RECURRENT (ICD-599.0) Assessment: New  will tx proph with 1/2 of cipro 500 after intercourse  continue to monitor  disc ways to  prev utis  enc good water intake  Her updated medication list for this problem includes:    Cipro 500 Mg Tabs (Ciprofloxacin hcl) .Marland Kitchen... 1/2 tablet as directed once daily as needed  Orders: Prescription Created Electronically [phone])  Complete Medication List: 1)  Flexeril 10 Mg Tabs (Cyclobenzaprine hcl) .... Take 1 tablet by mouth three times a day as needed 2)  Monopril Hct 10-12.5 Mg Tabs (Fosinopril sodium-hctz) .... Take 1 tablet by mouth once a day 3)  Lipitor 20 Mg Tabs (Atorvastatin calcium) .... Take 1/2 tablet by mouth once a day 4)  Glucotrol Xl 5 Mg Tb24 (Glipizide) .... Take 1 tablet by mouth once a day 5)  Hyoscyamine Sulfate 0.125 Mg Subl (Hyoscyamine sulfate) .... Place 1 tablet under tongue once a day as needed 6)  Mobic 15 Mg Tabs (Meloxicam) .... Take 1 tablet by mouth once a day with food as needed 7)  Loratadine 10 Mg Tabs (Loratadine) .... Take one by mouth dialy 8)  Albuterol 90 Mcg/act Aers (Albuterol) .... 2 puffs up to every 4 hours as needed wheezing 9)  Advair Diskus 100-50 Mcg/dose Misc (Fluticasone-salmeterol) .Marland Kitchen.. 1 inhalation two times a day during allergy season 10)  Calcium 600 Mg Tabs (Calcium) .... Take 2 by mouth daily 11)  Onetouch Ultra Test Strp (Glucose blood) .... Check sugar twice a day as instructed dm 250.0 that is not optimally controlled 12)  Metformin Hcl 1000 Mg Tabs (Metformin hcl) .Marland Kitchen.. 1 by mouth two times a day 13)  Multivitamins Tabs (Multiple vitamin) .... One daily 14)  Synthroid 50 Mcg Tabs (Levothyroxine sodium) .... Take 1 tablet by mouth once a  day 15)  Cipro 500 Mg Tabs (Ciprofloxacin hcl) .... 1/2 tablet as directed once daily as needed  Patient Instructions: 1)  take 1/2 of cipro tab after intercouse  2)  let me know if utis persist  3)  labs today for thyroid and also sugar  4)  keep working on healthy diet  Prescriptions: CIPRO 500 MG TABS (CIPROFLOXACIN HCL) 1/2 tablet as directed once daily as needed  #15 x 1   Entered and Authorized by:   Judith Part MD   Signed by:   Judith Part MD on 02/03/2010   Method used:   Electronically to        [facility]. [address] (retail)       [address] [place], [place]  [address]       Ph: [phone]       Fax: [phone]   RxID:   [id number]   Current Allergies (reviewed today): No known allergies

## 2010-10-05 NOTE — Miscellaneous (Signed)
Summary: Synthroid rx  Medications Added SYNTHROID 50 MCG TABS (LEVOTHYROXINE SODIUM) Take 1 tablet by mouth once a day       Clinical Lists Changes  Medications: Removed medication of SYNTHROID 75 MCG TABS (LEVOTHYROXINE SODIUM) Take 1 tablet by mouth once a day Added new medication of SYNTHROID 50 MCG TABS (LEVOTHYROXINE SODIUM) Take 1 tablet by mouth once a day - Signed Rx of SYNTHROID 50 MCG TABS (LEVOTHYROXINE SODIUM) Take 1 tablet by mouth once a day;  #30 x 5;  Signed;  Entered by: Lewanda Rife LPN;  Authorized by: Judith Part MD;  Method used: Electronically to [facility]. [address] [place], [place], [place]  [address], Ph: [phone], Fax: [phone]    Prescriptions: SYNTHROID 50 MCG TABS (LEVOTHYROXINE SODIUM) Take 1 tablet by mouth once a day  #30 x 5   Entered by:   Lewanda Rife LPN   Authorized by:   Judith Part MD   Signed by:   Lewanda Rife LPN on 64/40/3474   Method used:   Electronically to        [facility] [address] (retail)       [address] [place], [place]  [address]       Ph: [phone]       Fax: [phone]   RxID:   [id number]  Pt has appt to f/u with Dr Milinda Antis on 02/03/10 at 3:15pm.Rena [facility] LPN  December 22, 2009 4:57 PM  Current Allergies: No known allergies

## 2010-10-05 NOTE — Miscellaneous (Signed)
   Clinical Lists Changes  Observations: Added new observation of MAMMOGRAM: Normal (05/06/2010 9:50)

## 2010-10-05 NOTE — Miscellaneous (Signed)
Summary: Physical Therapy Discharge,[facility]  Physical Therapy Discharge,[facility]   Imported By: Beau Fanny 06/15/2010 10:00:22  _____________________________________________________________________  External Attachment:    Type:   Image     Comment:   External Document

## 2010-10-05 NOTE — Assessment & Plan Note (Signed)
Summary: poison oak   Vital Signs:  Patient profile:   60 year old female Height:      64 inches Weight:      223.50 pounds BMI:     38.50 Temp:     97.6 degrees F oral Pulse rate:   88 / minute Pulse rhythm:   regular BP sitting:   118 / 78  (left arm) Cuff size:   large  Vitals Entered By: Delilah Shan CMA Duncan Dull) (Jan 05, 2010 10:46 AM) CC: Poison oak   History of Present Illness: 60 yo female with poison oak/ivy.  Was working in her yard and developed an itchy irritated rash next day. Started on left forearm, then developped spots on bilateral hips, legs, upper arms. Tried OTC calamine lotion and hydrocortisone cream with no relief of symptoms.  Current Medications (verified): 1)  Flexeril 10 Mg Tabs (Cyclobenzaprine Hcl) .... Take 1 Tablet By Mouth Three Times A Day As Needed 2)  Monopril Hct 10-12.5 Mg Tabs (Fosinopril Sodium-Hctz) .... Take 1 Tablet By Mouth Once A Day 3)  Lipitor 20 Mg Tabs (Atorvastatin Calcium) .... Take 1/2 Tablet By Mouth Once A Day 4)  Glucotrol Xl 5 Mg Tb24 (Glipizide) .... Take 1 Tablet By Mouth Once A Day 5)  Hyoscyamine Sulfate 0.125 Mg Subl (Hyoscyamine Sulfate) .... Place 1 Tablet Under Tongue Once A Day As Needed 6)  Mobic 15 Mg Tabs (Meloxicam) .... Take 1 Tablet By Mouth Once A Day With Food As Needed 7)  Loratadine 10 Mg Tabs (Loratadine) .... Take One By Mouth Dialy 8)  Albuterol 90 Mcg/act Aers (Albuterol) .... 2 Puffs Up To Every 4 Hours As Needed Wheezing 9)  Advair Diskus 100-50 Mcg/dose Misc (Fluticasone-Salmeterol) .Marland Kitchen.. 1 Inhalation Two Times A Day During Allergy Season 10)  Calcium 600 Mg Tabs (Calcium) .... Take 2 By Mouth Daily 11)  Onetouch Ultra Test   Strp (Glucose Blood) .... Check Sugar Twice A Day As Instructed Dm 250.0 That Is Not Optimally Controlled 12)  Metformin Hcl 1000 Mg Tabs (Metformin Hcl) .Marland Kitchen.. 1 By Mouth Two Times A Day 13)  Multivitamins   Tabs (Multiple Vitamin) .... One Daily 14)  Pyridium 100 Mg Tabs  (Phenazopyridine Hcl) .Marland Kitchen.. 1 Tab By Mouth Three Times A Day X 2 Days 15)  Synthroid 50 Mcg Tabs (Levothyroxine Sodium) .... Take 1 Tablet By Mouth Once A Day 16)  Prednisone 10 Mg Tabs (Prednisone) .... 4 Tab By Mouth X 1 Week, 3 Tabs By Mouth X 1 Week, 2 Tabs By Mouth X 4 Days, 1 Tab By Mouth X Days. Dispense Qs  Allergies (verified): No Known Drug Allergies  Review of Systems      See HPI General:  Denies fever. Derm:  Complains of itching and rash.  Physical Exam  General:  overweight but generally well appearing  Skin:  linear erythematous plaques on left forearm, bilateral hips, thighs, calfs, upper arms (some as long as 8 inches). Psych:  normal affect, talkative and pleasant    Impression & Recommendations:  Problem # 1:  CONTACT DERMATITIS&OTHER ECZEMA DUE TO PLANTS (ICD-692.6) Assessment New Given widespread, large lesions, will treat with oral prednisone.  See instructions for taper below.  Follow up in no improvement in 3 days. Her updated medication list for this problem includes:    Loratadine 10 Mg Tabs (Loratadine) .Marland Kitchen... Take one by mouth dialy    Prednisone 10 Mg Tabs (Prednisone) .Marland KitchenMarland KitchenMarland KitchenMarland Kitchen 4 tab by mouth x 1 week, 3 tabs  by mouth x 1 week, 2 tabs by mouth x 4 days, 1 tab by mouth x days. dispense qs  Complete Medication List: 1)  Flexeril 10 Mg Tabs (Cyclobenzaprine hcl) .... Take 1 tablet by mouth three times a day as needed 2)  Monopril Hct 10-12.5 Mg Tabs (Fosinopril sodium-hctz) .... Take 1 tablet by mouth once a day 3)  Lipitor 20 Mg Tabs (Atorvastatin calcium) .... Take 1/2 tablet by mouth once a day 4)  Glucotrol Xl 5 Mg Tb24 (Glipizide) .... Take 1 tablet by mouth once a day 5)  Hyoscyamine Sulfate 0.125 Mg Subl (Hyoscyamine sulfate) .... Place 1 tablet under tongue once a day as needed 6)  Mobic 15 Mg Tabs (Meloxicam) .... Take 1 tablet by mouth once a day with food as needed 7)  Loratadine 10 Mg Tabs (Loratadine) .... Take one by mouth dialy 8)  Albuterol 90  Mcg/act Aers (Albuterol) .... 2 puffs up to every 4 hours as needed wheezing 9)  Advair Diskus 100-50 Mcg/dose Misc (Fluticasone-salmeterol) .Marland Kitchen.. 1 inhalation two times a day during allergy season 10)  Calcium 600 Mg Tabs (Calcium) .... Take 2 by mouth daily 11)  Onetouch Ultra Test Strp (Glucose blood) .... Check sugar twice a day as instructed dm 250.0 that is not optimally controlled 12)  Metformin Hcl 1000 Mg Tabs (Metformin hcl) .Marland Kitchen.. 1 by mouth two times a day 13)  Multivitamins Tabs (Multiple vitamin) .... One daily 14)  Pyridium 100 Mg Tabs (Phenazopyridine hcl) .Marland Kitchen.. 1 tab by mouth three times a day x 2 days 15)  Synthroid 50 Mcg Tabs (Levothyroxine sodium) .... Take 1 tablet by mouth once a day 16)  Prednisone 10 Mg Tabs (Prednisone) .... 4 tab by mouth x 1 week, 3 tabs by mouth x 1 week, 2 tabs by mouth x 4 days, 1 tab by mouth x days. dispense qs Prescriptions: PREDNISONE 10 MG TABS (PREDNISONE) 4 tab by mouth x 1 week, 3 tabs by mouth x 1 week, 2 tabs by mouth x 4 days, 1 tab by mouth x days. dispense qs  #1 x 0   Entered and Authorized by:   Ruthe Mannan MD   Signed by:   Ruthe Mannan MD on 01/05/2010   Method used:   Print then Give to Patient   RxID:   [id number]   Current Allergies (reviewed today): No known allergies

## 2010-10-05 NOTE — Progress Notes (Signed)
Summary: dog bite  Phone Note Call from Patient   Caller: Spouse Summary of Call: Pt's husband called stating pt was bitten by their dog and asked if he could bring her in to be checked.  I asked if wounds looked like they needed sutures and he said he couldnt tell because she is bleeding.  I advised him to take pt to an urgent care for possible sutures.  Offered appt tomorrow morning to check if he didnt think sutures are needed today.   Initial call taken by: Lowella Petties CMA,  March 31, 2010 4:19 PM  Follow-up for Phone Call        agree with above  wash with antibacterial soap and water will need Tetnus shot as well since it was over 5 years ago  dog bites tend to get infected - so want to make sure she seeks care  Follow-up by: Judith Part MD,  March 31, 2010 5:15 PM  Additional Follow-up for Phone Call Additional follow up Details #1::        Patient notified as instructed by telephone cell # [phone]. Pt said she was at the urgent care now. I informed pt she would need to get a tetanus shot and pt said she would let the UC know. Pt to contact our office if needed.Lewanda Rife LPN  March 31, 2010 5:22 PM   thank you  Additional Follow-up by: Judith Part MD,  March 31, 2010 5:26 PM

## 2010-10-05 NOTE — Assessment & Plan Note (Signed)
Summary: ?UTI/CLE   Vital Signs:  Patient profile:   60 year old female Height:      64 inches Weight:      220.25 pounds BMI:     37.94 Temp:     98 degrees F oral Pulse rate:   92 / minute Pulse rhythm:   regular BP sitting:   114 / 80  (left arm) Cuff size:   large  Vitals Entered By: Delilah Shan CMA Duncan Dull) (December 03, 2009 3:08 PM) CC: ? UTI   History of Present Illness: 60 yo here for UTI symptoms.  Woke up this morning with increased urinary frequency, dysuria. No hematuria. No n/v, no fevers, no back pain.  PMH- has had 3 UTIs in past 10 months.  Current Medications (verified): 1)  Flexeril 10 Mg Tabs (Cyclobenzaprine Hcl) .... Take 1 Tablet By Mouth Three Times A Day As Needed 2)  Monopril Hct 10-12.5 Mg Tabs (Fosinopril Sodium-Hctz) .... Take 1 Tablet By Mouth Once A Day 3)  Lipitor 20 Mg Tabs (Atorvastatin Calcium) .... Take 1/2 Tablet By Mouth Once A Day 4)  Glucotrol Xl 5 Mg Tb24 (Glipizide) .... Take 1 Tablet By Mouth Once A Day 5)  Hyoscyamine Sulfate 0.125 Mg Subl (Hyoscyamine Sulfate) .... Place 1 Tablet Under Tongue Once A Day As Needed 6)  Mobic 15 Mg Tabs (Meloxicam) .... Take 1 Tablet By Mouth Once A Day With Food As Needed 7)  Loratadine 10 Mg Tabs (Loratadine) .... Take One By Mouth Dialy 8)  Albuterol 90 Mcg/act Aers (Albuterol) .... 2 Puffs Up To Every 4 Hours As Needed Wheezing 9)  Advair Diskus 100-50 Mcg/dose Misc (Fluticasone-Salmeterol) .Marland Kitchen.. 1 Inhalation Two Times A Day During Allergy Season 10)  Calcium 600 Mg Tabs (Calcium) .... Take 2 By Mouth Daily 11)  Onetouch Ultra Test   Strp (Glucose Blood) .... Check Sugar Twice A Day As Instructed Dm 250.0 That Is Not Optimally Controlled 12)  Metformin Hcl 1000 Mg Tabs (Metformin Hcl) .Marland Kitchen.. 1 By Mouth Two Times A Day 13)  Multivitamins   Tabs (Multiple Vitamin) .... One Daily 14)  Synthroid 75 Mcg Tabs (Levothyroxine Sodium) .... Take 1 Tablet By Mouth Once A Day 15)  Cipro 500 Mg Tabs (Ciprofloxacin  Hcl) .Marland Kitchen.. 1 By Mouth 2 Times Daily X 7 Days 16)  Pyridium 100 Mg Tabs (Phenazopyridine Hcl) .Marland Kitchen.. 1 Tab By Mouth Three Times A Day X 2 Days  Allergies (verified): No Known Drug Allergies  Review of Systems      See HPI General:  Denies chills and loss of appetite. GU:  Complains of dysuria and urinary frequency; denies hematuria, incontinence, nocturia, and urinary hesitancy.  Physical Exam  General:  overweight but generally well appearing  Abdomen:  No suprapubic tenderness NO CVA tenderness Psych:  normal affect, talkative and pleasant    Impression & Recommendations:  Problem # 1:  UTI (ICD-599.0) Assessment New UA pos for UTI. Given recent recurrent UTIs, will send for culture. Cipro 500 mg two times a day x 7days, pyridium for two days. Her updated medication list for this problem includes:    Cipro 500 Mg Tabs (Ciprofloxacin hcl) .Marland Kitchen... 1 by mouth 2 times daily x 7 days    Pyridium 100 Mg Tabs (Phenazopyridine hcl) .Marland Kitchen... 1 tab by mouth three times a day x 2 days  Orders: T-Culture, Urine ([id number]) UA Dipstick w/o Micro (manual) (62694)  Complete Medication List: 1)  Flexeril 10 Mg Tabs (Cyclobenzaprine hcl) .... Take 1 tablet  by mouth three times a day as needed 2)  Monopril Hct 10-12.5 Mg Tabs (Fosinopril sodium-hctz) .... Take 1 tablet by mouth once a day 3)  Lipitor 20 Mg Tabs (Atorvastatin calcium) .... Take 1/2 tablet by mouth once a day 4)  Glucotrol Xl 5 Mg Tb24 (Glipizide) .... Take 1 tablet by mouth once a day 5)  Hyoscyamine Sulfate 0.125 Mg Subl (Hyoscyamine sulfate) .... Place 1 tablet under tongue once a day as needed 6)  Mobic 15 Mg Tabs (Meloxicam) .... Take 1 tablet by mouth once a day with food as needed 7)  Loratadine 10 Mg Tabs (Loratadine) .... Take one by mouth dialy 8)  Albuterol 90 Mcg/act Aers (Albuterol) .... 2 puffs up to every 4 hours as needed wheezing 9)  Advair Diskus 100-50 Mcg/dose Misc (Fluticasone-salmeterol) .Marland Kitchen.. 1 inhalation two  times a day during allergy season 10)  Calcium 600 Mg Tabs (Calcium) .... Take 2 by mouth daily 11)  Onetouch Ultra Test Strp (Glucose blood) .... Check sugar twice a day as instructed dm 250.0 that is not optimally controlled 12)  Metformin Hcl 1000 Mg Tabs (Metformin hcl) .Marland Kitchen.. 1 by mouth two times a day 13)  Multivitamins Tabs (Multiple vitamin) .... One daily 14)  Synthroid 75 Mcg Tabs (Levothyroxine sodium) .... Take 1 tablet by mouth once a day 15)  Cipro 500 Mg Tabs (Ciprofloxacin hcl) .Marland Kitchen.. 1 by mouth 2 times daily x 7 days 16)  Pyridium 100 Mg Tabs (Phenazopyridine hcl) .Marland Kitchen.. 1 tab by mouth three times a day x 2 days  Patient Instructions: 1)  Drink plenty of fluids up to 3-4 quarts a day. Cranberry juice is especially recommended in addition to large amounts of water. Avoid caffeine & carbonated drinks, they tend to irritate the bladder, Return in 3-5 days if you're not better: sooner if you're feeling worse.  Prescriptions: PYRIDIUM 100 MG TABS (PHENAZOPYRIDINE HCL) 1 tab by mouth three times a day x 2 days  #6 x 0   Entered and Authorized by:   Ruthe Mannan MD   Signed by:   Ruthe Mannan MD on 12/03/2009   Method used:   Electronically to        [facility] [address] (retail)       [address] [place], [place]  [address]       Ph: [phone]       Fax: [phone]   RxID:   [id number] CIPRO 500 MG TABS (CIPROFLOXACIN HCL) 1 by mouth 2 times daily x 7 days  #14 x 0   Entered and Authorized by:   Ruthe Mannan MD   Signed by:   Ruthe Mannan MD on 12/03/2009   Method used:   Electronically to        [facility] [address] (retail)       [address] [place], [place]  [address]       Ph: [phone]       Fax: [phone]   RxID:   [id number]   Current Allergies (reviewed today): No known allergies   Laboratory Results   Urine Tests   Date/Time Reported: December 03, 2009 3:18 PM    Routine Urinalysis   Color: yellow Appearance: Cloudy Glucose: negative   (Normal Range: Negative) Bilirubin: negative   (Normal Range: Negative) Ketone:  negative   (Normal Range: Negative) Spec. Gravity: 1.015   (Normal Range: 1.003-1.035) Blood: large   (Normal Range: Negative) pH: 7.0   (Normal Range: 5.0-8.0) Protein: 30   (Normal Range: Negative) Urobilinogen: 0.2   (Normal Range: 0-1) Nitrite: positive   (Normal Range: Negative) Leukocyte Esterace: moderate   (Normal Range: Negative)       Appended Document: ?UTI/CLE

## 2010-10-05 NOTE — Letter (Signed)
Summary: Results Follow up Letter  [place] at [facility]  [address] [place], [place] [address]   Phone: [phone]  Fax: [phone]    05/17/2010 MRN: [id number]  [facility] [address] [place], [place]  [address]  Dear Ms. Courtney,  The following are the results of your recent test(s):  Test         Result    Pap Smear:        Normal _____  Not Normal _____ Comments: ______________________________________________________ Cholesterol: LDL(Bad cholesterol):         Your goal is less than:         HDL (Good cholesterol):       Your goal is more than: Comments:  ______________________________________________________ Mammogram:        Normal __X___  Not Normal _____ Comments:Repeat in 1 year ___________________________________________________________________ Hemoccult:        Normal _____  Not normal _______ Comments:    _____________________________________________________________________ Other Tests:    We routinely do not discuss normal results over the telephone.  If you desire a copy of the results, or you have any questions about this information we can discuss them at your next office visit.   Sincerely,      Kim Dance,CMA(AAMA)for Dr. Roxy Manns

## 2010-10-05 NOTE — Miscellaneous (Signed)
Summary: PT Initial Summary/[facility]  PT Initial [facility]   Imported By: Lanelle Bal 05/18/2010 14:13:18  _____________________________________________________________________  External Attachment:    Type:   Image     Comment:   External Document

## 2010-10-05 NOTE — Progress Notes (Signed)
Summary: refill request for mobic  Phone Note Refill Request Message from:  Fax from Pharmacy  Refills Requested: Medication #1:  MOBIC 15 MG TABS Take 1 tablet by mouth once a day with food as needed   Last Refilled: 04/12/2010 Faxed request from [place], [phone].  Initial call taken by: Lowella Petties CMA,  April 13, 2010 8:14 AM  Follow-up for Phone Call        looks like this was just done?  Follow-up by: Judith Part MD,  April 13, 2010 12:08 PM  Additional Follow-up for Phone Call Additional follow up Details #1::        Called and left a message on voicemail at pharmacy stating that Mobic 15mg  Rx was sent electronically on 04/06/2010, #30 with 11 refills by Dr. Milinda Antis. Additional Follow-up by: Linde Gillis CMA Duncan Dull),  April 13, 2010 12:35 PM

## 2010-10-05 NOTE — Consult Note (Signed)
Summary: Sleep Consult/[facility]  Sleep Consult/[facility]   Imported By: Sherian Rein 02/02/2010 07:24:22  _____________________________________________________________________  External Attachment:    Type:   Image     Comment:   External Document

## 2010-10-05 NOTE — Assessment & Plan Note (Signed)
Summary: 6 WK F/U DLO   Vital Signs:  Patient profile:   60 year old female Weight:      223 pounds Temp:     98.7 degrees F oral Pulse rate:   84 / minute Pulse rhythm:   regular BP sitting:   124 / 78  (left arm) Cuff size:   large  Vitals Entered By: Sydell Axon LPN (May 27, 2010 8:05 AM) CC: 6 Week follow-up on shoulder pain   History of Present Illness: 60 year old female:  6 week f/u s/p treatment for cuff tendinopathy, now about 80% better. Occ night pain and lateral L deltoid radiating pain. Compliant with HEP daily  REVIEW OF SYSTEMS  GEN: No systemic complaints, no fevers, chills, sweats, or other acute illnesses MSK: Detailed in the HPI GI: tolerating PO intake without difficulty Neuro: No numbness, parasthesias, or tingling associated. Otherwise the pertinent positives of the ROS are noted above.    Shoulder: B Inspection: No muscle wasting or winging Ecchymosis/edema: neg  AC joint, scapula, clavicle: NT Cervical spine: NT, full ROM Spurling's: neg Abduction: full, 5/5 Flexion: full, 5/5 IR, full, lift-off: 5/5 ER at neutral: full, 5/5 AC crossover and compression: neg Neer: neg Hawkins: neg Drop Test: neg Empty Can: neg Supraspinatus insertion: NT Bicipital groove: NT Sulcus sign: neg Scapular dyskinesis: none C5-T1 intact Sensation intact Grip 5/5   Allergies: No Known Drug Allergies   Impression & Recommendations:  Problem # 1:  SHOULDER IMPINGEMENT SYNDROME, LEFT (ICD-726.2) Assessment Improved much improved. excellent outcome. cont hep  f/u as needed   Complete Medication List: 1)  Flexeril 10 Mg Tabs (Cyclobenzaprine hcl) .... Take 1 tablet by mouth three times a day as needed 2)  Monopril Hct 10-12.5 Mg Tabs (Fosinopril sodium-hctz) .... Take 1 tablet by mouth once a day 3)  Lipitor 20 Mg Tabs (Atorvastatin calcium) .... Take 1/2 tablet by mouth once a day 4)  Glucotrol Xl 5 Mg Tb24 (Glipizide) .... Take 1 tablet by mouth  once a day 5)  Hyoscyamine Sulfate 0.125 Mg Subl (Hyoscyamine sulfate) .... Place 1 tablet under tongue once a day as needed 6)  Mobic 15 Mg Tabs (Meloxicam) .... Take 1 tablet by mouth once a day with food as needed 7)  Loratadine 10 Mg Tabs (Loratadine) .... Take one by mouth dialy 8)  Albuterol 90 Mcg/act Aers (Albuterol) .... 2 puffs up to every 4 hours as needed wheezing 9)  Advair Diskus 100-50 Mcg/dose Misc (Fluticasone-salmeterol) .Marland Kitchen.. 1 inhalation two times a day during allergy season 10)  Calcium 600 Mg Tabs (Calcium) .... Take 2 by mouth daily 11)  Onetouch Ultra Test Strp (Glucose blood) .... Check sugar twice a day as instructed dm 250.0 that is not optimally controlled 12)  Metformin Hcl 1000 Mg Tabs (Metformin hcl) .Marland Kitchen.. 1 by mouth two times a day 13)  Multivitamins Tabs (Multiple vitamin) .... One daily 14)  Synthroid 50 Mcg Tabs (Levothyroxine sodium) .Marland Kitchen.. 1 tab once daily  Current Allergies (reviewed today): No known allergies

## 2010-10-05 NOTE — Progress Notes (Signed)
Summary: needs order for steroid pads  Phone Note Call from Patient Call back at [phone]   Caller: Patient Summary of Call: Pt has been going to physical therapy for her shoulder problem and the therapist has recommended that she have steroid pads.  Therapist can supply these but they need an order from you.  They were to have faxed an order for your signature.  [facility]rapists [facility].  Do you know anything about this? Initial call taken by: Lowella Petties CMA,  May 13, 2010 12:34 PM  Follow-up for Phone Call        I would guess the mean decadron iontophoresis  Call PT confirm this is what they mean - i never got a fax, have them resend.   Usually I just have to sign and send back to them. Hannah Beat MD  May 13, 2010 2:06 PM   Additional Follow-up for Phone Call Additional follow up Details #1::        Called physical therapy, they said they would refax.           Lowella Petties CMA  May 13, 2010 2:59 PM

## 2010-10-07 ENCOUNTER — Ambulatory Visit: Admit: 2010-10-07 | Payer: Self-pay | Admitting: Family Medicine

## 2010-10-07 ENCOUNTER — Other Ambulatory Visit (INDEPENDENT_AMBULATORY_CARE_PROVIDER_SITE_OTHER): Payer: BC Managed Care – PPO

## 2010-10-07 ENCOUNTER — Encounter (INDEPENDENT_AMBULATORY_CARE_PROVIDER_SITE_OTHER): Payer: Self-pay | Admitting: *Deleted

## 2010-10-07 ENCOUNTER — Other Ambulatory Visit: Payer: Self-pay | Admitting: Family Medicine

## 2010-10-07 DIAGNOSIS — E119 Type 2 diabetes mellitus without complications: Secondary | ICD-10-CM

## 2010-10-07 DIAGNOSIS — E785 Hyperlipidemia, unspecified: Secondary | ICD-10-CM

## 2010-10-07 LAB — RENAL FUNCTION PANEL
CO2: 29 mEq/L (ref 19–32)
Calcium: 9.1 mg/dL (ref 8.4–10.5)
GFR: 78.99 mL/min (ref >60.00)
Potassium: 4.1 mEq/L (ref 3.5–5.1)
Sodium: 138 mEq/L (ref 135–145)

## 2010-10-07 LAB — LIPID PANEL
Cholesterol: 146 mg/dL (ref 0–200)
Triglycerides: 178 mg/dL — ABNORMAL HIGH (ref 0.0–149.0)

## 2010-10-07 LAB — HEMOGLOBIN A1C: Hgb A1c MFr Bld: 6.6 % — ABNORMAL HIGH (ref 4.6–6.5)

## 2010-10-07 LAB — AST: AST: 21 U/L (ref 0–37)

## 2010-10-11 ENCOUNTER — Telehealth: Payer: Self-pay | Admitting: Endocrinology

## 2010-10-12 ENCOUNTER — Encounter: Payer: Self-pay | Admitting: Family Medicine

## 2010-10-13 ENCOUNTER — Ambulatory Visit (INDEPENDENT_AMBULATORY_CARE_PROVIDER_SITE_OTHER): Payer: BC Managed Care – PPO | Admitting: Family Medicine

## 2010-10-13 ENCOUNTER — Encounter: Payer: Self-pay | Admitting: Family Medicine

## 2010-10-13 DIAGNOSIS — I10 Essential (primary) hypertension: Secondary | ICD-10-CM

## 2010-10-13 DIAGNOSIS — E785 Hyperlipidemia, unspecified: Secondary | ICD-10-CM

## 2010-10-13 DIAGNOSIS — E119 Type 2 diabetes mellitus without complications: Secondary | ICD-10-CM

## 2010-10-13 LAB — HM DIABETES FOOT EXAM

## 2010-10-20 ENCOUNTER — Telehealth: Payer: Self-pay | Admitting: Pulmonary Disease

## 2010-10-21 NOTE — Progress Notes (Signed)
Summary: synthroid  Phone Note Refill Request Message from:  Fax from Pharmacy on October 11, 2010 9:23 AM  Refills Requested: Medication #1:  SYNTHROID 50 MCG TABS 1 tab once daily   Dosage confirmed as above?Dosage Confirmed   Last Refilled: 05/05/2010  Method Requested: Electronic Next Appointment Scheduled: none Initial call taken by: Brenton Grills CMA (AAMA),  October 11, 2010 9:23 AM    Prescriptions: SYNTHROID 50 MCG TABS (LEVOTHYROXINE SODIUM) 1 tab once daily  #30 x 4   Entered by:   Brenton Grills CMA (AAMA)   Authorized by:   Minus Breeding MD   Signed by:   Brenton Grills CMA (AAMA) on 10/11/2010   Method used:   Electronically to        [facility] [address] (retail)       [address] [place], [place]  [address]       Ph: [phone]       Fax: [phone]   RxID:   [id number]

## 2010-10-27 NOTE — Assessment & Plan Note (Signed)
Summary: 6 MTH FOLLOW-UP AFTER LABS/ [facility]   Vital Signs:  Patient profile:   60 year old female Height:      64 inches Weight:      228.75 pounds BMI:     39.41 Temp:     98.8 degrees F oral Pulse rate:   92 / minute Pulse rhythm:   regular BP sitting:   122 / 68  (left arm) Cuff size:   large  Vitals Entered By: Lewanda Rife LPN (October 13, 2010 4:04 PM) CC: six month follow up after labs   History of Present Illness: here for 6 mo f/u of HTN and lipids and DM   has been very busy and feeling ok  nothing new medically  went for PT for shoulders -- not a lot of help (not as compliant )  may need to consider cortisone shot if not imp   wt is down 3 lb with bmi of 39 is eating healthier than previously- proud of that  not exercising right now due to wintertime  is moving to place with exercise room - excited about that    plans to retire in june - may try something diff part time   122/68 bp in very good control  some imp in lipids  trig 178 down from 248, HDL 38 and LDL 72  DM is stable with AIC of 6.6 - same as last check  opthy- was in august 2011 - [facility] , no retinop   tsh stable in dec - endo  Allergies (verified): No Known Drug Allergies  Past History:  Past Medical History: Last updated: 04/06/2010 Allergic rhinitis Asthma (as child) GERD Hyperlipidemia (mild) Hypertension (borderline) Hypothyroidism Diabetes mellitus, type II (2/06 elevated microalbumin) obesity OSA frequent utis with coital prophylaxis   opthy- [facility]  Past Surgical History: Last updated: 01/29/2010 Foot surgery Endometrial biopsy (1999) D&C - Fibroids (11/2001) Dexa (03/2002) Stress Cardiolyte (05/2003) 2-D Echo (05/2003) Holter- PVC's Colonoscopy (07/2003)- hemorroids--no polyp-- re check 10 years  Sleep Study (05/2005)--66/hr with desat to 78%  Family History: Last updated: 03/09/2010 M--CAD/ DM, rheumatism F-- alzheimers , kidney stones ,  rheumatism PGM Bca B CAD for bypass signs mother had hypothyroidism brother has hypothyroidism   Social History: Last updated: 03/09/2010 Never Smoked pt is married. pt works as a Runner, broadcasting/film/video.  occ alcohol  some walking for exercise.  Risk Factors: Smoking Status: never (11/13/2007)  Review of Systems General:  Denies fatigue, loss of appetite, and malaise. Eyes:  Denies blurring and eye irritation. CV:  Denies chest pain or discomfort, lightheadness, and palpitations. Resp:  Denies cough, shortness of breath, and wheezing. GI:  Denies change in bowel habits and indigestion. GU:  Denies dysuria and urinary frequency. MS:  Denies joint redness, joint swelling, muscle aches, and cramps. Derm:  Denies itching, lesion(s), poor wound healing, and rash. Neuro:  Denies numbness and tingling. Psych:  mood is ok . Endo:  Denies cold intolerance, excessive thirst, excessive urination, and heat intolerance. Heme:  Denies abnormal bruising and bleeding.  Physical Exam  General:  obese and well appearing  Head:  normocephalic, atraumatic, and no abnormalities observed.   Eyes:  vision grossly intact, pupils equal, pupils round, and pupils reactive to light.   Ears:  R ear normal and L ear normal.   Nose:  no nasal discharge.   Mouth:  pharynx pink and moist.   Neck:  supple with full rom and no masses or thyromegally, no JVD  or carotid bruit  Chest Wall:  No deformities, masses, or tenderness noted. Lungs:  Normal respiratory effort, chest expands symmetrically. Lungs are clear to auscultation, no crackles or wheezes. Heart:  normal rate, regular rhythm, and no murmur.   Abdomen:  Bowel sounds positive,abdomen soft and non-tender without masses, organomegaly or hernias noted. no renal bruits  Msk:  No deformity or scoliosis noted of thoracic or lumbar spine.   Pulses:  R and L carotid,radial,femoral,dorsalis pedis and posterior tibial pulses are full and equal bilaterally Extremities:   No clubbing, cyanosis, edema, or deformity noted with normal full range of motion of all joints.   Neurologic:  sensation intact to light touch, gait normal, and DTRs symmetrical and normal.   Skin:  Intact without suspicious lesions or rashes Cervical Nodes:  No lymphadenopathy noted Inguinal Nodes:  No significant adenopathy Psych:  normal affect, talkative and pleasant   Diabetes Management Exam:    Foot Exam (with socks and/or shoes not present):       Sensory-Pinprick/Light touch:          Left medial foot (L-4): normal          Left dorsal foot (L-5): normal          Left lateral foot (S-1): normal          Right medial foot (L-4): normal          Right dorsal foot (L-5): normal          Right lateral foot (S-1): normal       Sensory-Monofilament:          Left foot: normal          Right foot: normal       Inspection:          Left foot: normal          Right foot: normal       Nails:          Left foot: normal          Right foot: normal    Eye Exam:       Eye Exam done elsewhere          Date: 04/05/2010          Results: normal          Done by: [facility]    Impression & Recommendations:  Problem # 1:  DIABETES MELLITUS, TYPE II (ICD-250.00) Assessment Unchanged  this is stable and well controlled no change in med  disc exercise and wt loss effort  f/u 6 mo  utd opthy Her updated medication list for this problem includes:    Monopril Hct 10-12.5 Mg Tabs (Fosinopril sodium-hctz) .Marland Kitchen... Take 1 tablet by mouth once a day    Glucotrol Xl 5 Mg Tb24 (Glipizide) .Marland Kitchen... Take 1 tablet by mouth once a day    Metformin Hcl 1000 Mg Tabs (Metformin hcl) .Marland Kitchen... 1 by mouth two times a day  Labs Reviewed: Creat: 0.8 (10/07/2010)     Last Eye Exam: normal (04/05/2010) Reviewed HgBA1c results: 6.6 (10/07/2010)  6.6 (03/16/2010)  Problem # 2:  HYPERTENSION (ICD-401.9) Assessment: Unchanged  this is well controlled - no change disc plan for exercise  Her updated  medication list for this problem includes:    Monopril Hct 10-12.5 Mg Tabs (Fosinopril sodium-hctz) .Marland Kitchen... Take 1 tablet by mouth once a day  BP today: 122/68 Prior BP: 114/70 (07/05/2010)  Labs Reviewed: K+: 4.1 (10/07/2010) Creat: : 0.8 (  10/07/2010)   Chol: 146 (10/07/2010)   HDL: 38.60 (10/07/2010)   LDL: 72 (10/07/2010)   TG: 178.0 (10/07/2010)  Problem # 3:  HYPERLIPIDEMIA (ICD-272.4) Assessment: Improved  improved tryglycerides - that is good rev fats and sat fats in diet  overall doing well with this  continue generic lipitor  lab and PE in 6 mo  Her updated medication list for this problem includes:    Lipitor 20 Mg Tabs (Atorvastatin calcium) .Marland Kitchen... Take 1/2 tablet by mouth once a day  Labs Reviewed: SGOT: 21 (10/07/2010)   SGPT: 22 (10/07/2010)   HDL:38.60 (10/07/2010), 32.70 (03/16/2010)  LDL:72 (10/07/2010), 78 (09/21/2009)  Chol:146 (10/07/2010), 144 (03/16/2010)  Trig:178.0 (10/07/2010), 248.0 (03/16/2010)  Complete Medication List: 1)  Flexeril 10 Mg Tabs (Cyclobenzaprine hcl) .... Take 1 tablet by mouth three times a day as needed 2)  Monopril Hct 10-12.5 Mg Tabs (Fosinopril sodium-hctz) .... Take 1 tablet by mouth once a day 3)  Lipitor 20 Mg Tabs (Atorvastatin calcium) .... Take 1/2 tablet by mouth once a day 4)  Glucotrol Xl 5 Mg Tb24 (Glipizide) .... Take 1 tablet by mouth once a day 5)  Hyoscyamine Sulfate 0.125 Mg Subl (Hyoscyamine sulfate) .... Place 1 tablet under tongue once a day as needed 6)  Mobic 15 Mg Tabs (Meloxicam) .... Take 1 tablet by mouth once a day with food as needed 7)  Loratadine 10 Mg Tabs (Loratadine) .... Take one by mouth dialy 8)  Albuterol 90 Mcg/act Aers (Albuterol) .... 2 puffs up to every 4 hours as needed wheezing 9)  Advair Diskus 100-50 Mcg/dose Misc (Fluticasone-salmeterol) .Marland Kitchen.. 1 inhalation two times a day during allergy season 10)  Calcium 600 Mg Tabs (Calcium) .... Take 2 by mouth daily 11)  Onetouch Ultra Test Strp (Glucose  blood) .... Check sugar twice a day as instructed dm 250.0 that is not optimally controlled 12)  Metformin Hcl 1000 Mg Tabs (Metformin hcl) .Marland Kitchen.. 1 by mouth two times a day 13)  Multivitamins Tabs (Multiple vitamin) .... One daily 14)  Synthroid 50 Mcg Tabs (Levothyroxine sodium) .Marland Kitchen.. 1 tab once daily 15)  Vicodin 5-500 Mg Tabs (Hydrocodone-acetaminophen) .... 1/2 to 1 tab by mouth three times a day as needed for pain. 16)  Coq10 ?mg  .... Take 1 tablet by mouth once a day  Patient Instructions: 1)  sugar is stable  2)  blood pressure is well controlled 3)  cholesterol is improved 4)  keep working on diabetic diet  5)  start exercise - indoor/ outdoor  6)  follow up for shoulder if needed- get back to exercises  7)  schedule PE in 6 months with labs prior    Orders Added: 1)  Est. Patient Level IV [04540]     Current Allergies (reviewed today): No known allergies

## 2010-10-27 NOTE — Miscellaneous (Signed)
Summary: Controlled Substance Agreement  Controlled Substance Agreement   Imported By: Lanelle Bal 10/21/2010 08:56:13  _____________________________________________________________________  External Attachment:    Type:   Image     Comment:   External Document

## 2010-11-02 NOTE — Progress Notes (Signed)
Summary: needs order for new CPAP  Phone Note Call from Patient   Caller: Patient Call For: [facility] Summary of Call: Patient phoned stated that she needed to have her CPAP replaced stated that Dr Shelle Iron told her at her last visit that he would be willing to help. 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 She can be reached at [phone] or her cell [phone] Patient uses [facility] Initial call taken by: Vedia Coffer,  October 20, 2010 11:44 AM  Follow-up for Phone Call        LMTCBx! on cell phone and home number. Carron Curie CMA  October 20, 2010 12:56 PM  Spoke with pt and she states she is needing an order for a new cpap machine, the one she has is over 68 yrs old. She does not need a new mask or supplies, just machine. Pt uses [facility]. Please advise if ok to place order. Carron Curie CMA  October 21, 2010 9:21 AM   Additional Follow-up for Phone Call Additional follow up Details #1::        if insurance will cover, will send an order to [facility]. Additional Follow-up by: Barbaraann Share MD,  October 22, 2010 6:02 PM    Additional Follow-up for Phone Call Additional follow up Details #2::    Spoke with pt.  She states that she checked with her ins, and since it has been 5 yrs since her last machine, her ins will cover new one.  I advised we will send order to [facility] for new machine.  Is there a specific one to order? Pls advise or send order to [facility]. thanks! Follow-up by: Vernie Murders,  October 25, 2010 9:23 AM  Additional Follow-up for Phone Call Additional follow up Details #3:: Details for Additional Follow-up Action Taken: order has already been sent .  see under orders Additional Follow-up by: Barbaraann Share MD,  October 25, 2010 4:44 PM

## 2011-01-21 NOTE — Procedures (Signed)
NAME:  Brooke Mitchell, Brooke Mitchell              ACCOUNT NO.:  [id number]   MEDICAL RECORD NO.:  [id number]          PATIENT TYPE:  OUT   LOCATION:  SLEEP CENTER                 FACILITY:  [facility]   PHYSICIAN:  Marcelyn Bruins, M.D. [facility] DATE OF BIRTH:  09-23-1950   DATE OF STUDY:  05/11/2005                              NOCTURNAL POLYSOMNOGRAM   REFERRING PHYSICIAN:  Dr. Roxy Manns.   INDICATION FOR STUDY:  Hypersomnia with sleep apnea.   EPWORTH SCORE:  12   SLEEP ARCHITECTURE:  The patient had a total sleep time of 276 minutes with  decreased slow-wave sleep and REM.  Sleep onset latency was normal and REM  onset was very prolonged.  Sleep efficiency was very poor at 61%.   IMPRESSION:  1.  Split night study reveals severe obstructive sleep apnea with 146 events      noted in the first 133 minutes of sleep.  This gave the patient a      respiratory disturbance index of 66 events per hour and O2 desaturation      as low as 78%.  The events were not positional, but they were associated      with loud snoring.  By split-night protocol, the patient was placed on      Ultra Mirage large full face mask and ultimately titrated to a final      pressure of 19 cm. Despite this pressure, the patient continued to have      some breakthrough obstructive events and snoring.  The CPAP titration      was very difficult because of the patient's restlessness toward the end      of the study.  2.  Rare premature ventricular contractions.  3.  Moderate numbers of leg jerks with some sleep disruption.           ______________________________  Marcelyn Bruins, M.D. [facility]  Diplomate, American Board of Sleep  Medicine     KC/MEDQ  D:  05/18/2005 10:05:58  T:  05/18/2005 10:34:24  Job:  [id number]

## 2011-03-16 ENCOUNTER — Other Ambulatory Visit: Payer: Self-pay | Admitting: *Deleted

## 2011-03-16 MED ORDER — LEVOTHYROXINE SODIUM 50 MCG PO TABS: 50.0000 ug | ORAL_TABLET | Freq: Every day | ORAL | Status: DC

## 2011-03-16 NOTE — Telephone Encounter (Signed)
R'cd fax from pharmacy requesting refill for Synthroid  Last OV-03/09/2010  Last filled-03/16/2011

## 2011-04-01 ENCOUNTER — Other Ambulatory Visit: Payer: Self-pay

## 2011-04-01 MED ORDER — MELOXICAM 15 MG PO TABS: 15.0000 mg | ORAL_TABLET | Freq: Every day | ORAL | Status: DC | PRN

## 2011-04-01 NOTE — Telephone Encounter (Signed)
[facility] faxed refill request for Meloxicam 15mg . Pt already scheduled for CPX with Dr Milinda Antis on 04/13/11. Please advise.

## 2011-04-01 NOTE — Telephone Encounter (Signed)
Will refil electronically  

## 2011-04-07 ENCOUNTER — Other Ambulatory Visit (INDEPENDENT_AMBULATORY_CARE_PROVIDER_SITE_OTHER): Payer: BC Managed Care – PPO

## 2011-04-07 ENCOUNTER — Telehealth (INDEPENDENT_AMBULATORY_CARE_PROVIDER_SITE_OTHER): Payer: BC Managed Care – PPO | Admitting: Family Medicine

## 2011-04-07 DIAGNOSIS — E785 Hyperlipidemia, unspecified: Secondary | ICD-10-CM

## 2011-04-07 DIAGNOSIS — I10 Essential (primary) hypertension: Secondary | ICD-10-CM

## 2011-04-07 DIAGNOSIS — Z0000 Encounter for general adult medical examination without abnormal findings: Secondary | ICD-10-CM | POA: Insufficient documentation

## 2011-04-07 DIAGNOSIS — E119 Type 2 diabetes mellitus without complications: Secondary | ICD-10-CM

## 2011-04-07 DIAGNOSIS — E039 Hypothyroidism, unspecified: Secondary | ICD-10-CM

## 2011-04-07 LAB — CBC WITH DIFFERENTIAL/PLATELET
Basophils Relative: 0.1 % (ref 0.0–3.0)
Eosinophils Relative: 2.2 % (ref 0.0–5.0)
HCT: 38 % (ref 36.0–46.0)
Hemoglobin: 12.7 g/dL (ref 12.0–15.0)
Lymphs Abs: 1.6 10*3/uL (ref 0.7–4.0)
MCV: 85.7 fl (ref 78.0–100.0)
Monocytes Absolute: 0.4 10*3/uL (ref 0.1–1.0)
Monocytes Relative: 7.8 % (ref 3.0–12.0)
Neutro Abs: 2.9 10*3/uL (ref 1.4–7.7)
WBC: 5.1 10*3/uL (ref 4.5–10.5)

## 2011-04-07 LAB — LIPID PANEL
Cholesterol: 127 mg/dL (ref 0–200)
HDL: 43 mg/dL (ref >39.00)
VLDL: 16.2 mg/dL (ref 0.0–40.0)

## 2011-04-07 LAB — COMPREHENSIVE METABOLIC PANEL
Alkaline Phosphatase: 40 U/L (ref 39–117)
BUN: 21 mg/dL (ref 6–23)
CO2: 26 mEq/L (ref 19–32)
Creatinine, Ser: 0.7 mg/dL (ref 0.4–1.2)
GFR: 89.2 mL/min (ref >60.00)
Glucose, Bld: 93 mg/dL (ref 70–99)
Total Bilirubin: 0.6 mg/dL (ref 0.3–1.2)
Total Protein: 6.8 g/dL (ref 6.0–8.3)

## 2011-04-07 LAB — TSH: TSH: 0.24 u[IU]/mL — ABNORMAL LOW (ref 0.35–5.50)

## 2011-04-07 LAB — HEMOGLOBIN A1C: Hgb A1c MFr Bld: 5.6 % (ref 4.6–6.5)

## 2011-04-07 NOTE — Telephone Encounter (Signed)
Message copied by Judy Pimple on Thu Apr 07, 2011  9:08 AM ------      Message from: Baldomero Lamy      Created: Mon Apr 04, 2011  7:48 AM      Regarding: Cpx labs thurs       Please order  future cpx labs for pt's upcomming lab appt.      Thanks      Rodney Booze

## 2011-04-12 ENCOUNTER — Encounter: Payer: Self-pay | Admitting: Family Medicine

## 2011-04-13 ENCOUNTER — Ambulatory Visit (INDEPENDENT_AMBULATORY_CARE_PROVIDER_SITE_OTHER): Payer: BC Managed Care – PPO | Admitting: Family Medicine

## 2011-04-13 ENCOUNTER — Encounter: Payer: Self-pay | Admitting: Family Medicine

## 2011-04-13 VITALS — BP 128/76 | HR 76 | Temp 98.1°F | Ht 64.25 in | Wt 191.8 lb

## 2011-04-13 DIAGNOSIS — I10 Essential (primary) hypertension: Secondary | ICD-10-CM

## 2011-04-13 DIAGNOSIS — Z1231 Encounter for screening mammogram for malignant neoplasm of breast: Secondary | ICD-10-CM

## 2011-04-13 DIAGNOSIS — E039 Hypothyroidism, unspecified: Secondary | ICD-10-CM

## 2011-04-13 DIAGNOSIS — E119 Type 2 diabetes mellitus without complications: Secondary | ICD-10-CM

## 2011-04-13 DIAGNOSIS — E785 Hyperlipidemia, unspecified: Secondary | ICD-10-CM

## 2011-04-13 DIAGNOSIS — Z0000 Encounter for general adult medical examination without abnormal findings: Secondary | ICD-10-CM

## 2011-04-13 MED ORDER — CYCLOBENZAPRINE HCL 10 MG PO TABS: 10.0000 mg | ORAL_TABLET | Freq: Three times a day (TID) | ORAL | Status: DC | PRN

## 2011-04-13 MED ORDER — LEVOTHYROXINE SODIUM 25 MCG PO TABS: 25.0000 ug | ORAL_TABLET | Freq: Every day | ORAL | Status: DC

## 2011-04-13 NOTE — Progress Notes (Signed)
Subjective:    Patient ID: Brooke Mitchell, female    DOB: [birth year], 60 y.o.   MRN: [id number]  HPI Here for health mt exam and to rev chronic med problems   Wt is down 37 lb!-- on wt watchers  Is really happy  Is walking for exercise  Recently has access to pool as well  Plans to join the [facility]   Zoster status Is interested in the vaccine  Has not called ins yet   Mam 9/11 Wants to set that up for next month  Self exam- no lumps   Pap 8/11 No gyn problems or symptoms   colonosc 11.04 normal- 10 year f/u   Td 7/11  DM a1c is down from 6.6 to 5.6  Not watching sugar readings -- did have lows for a while and had to eat earlier   tsh is low at .24  Need to cut dose  No symptoms at all , no tremor   Lipids good control with LDL 60s Lab Results  Component Value Date   CHOL 127 04/07/2011   CHOL 146 10/07/2010   CHOL 144 03/16/2010   Lab Results  Component Value Date   HDL 43.00 04/07/2011   HDL 38.60* 10/07/2010   HDL 62.13* 03/16/2010   Lab Results  Component Value Date   LDLCALC 68 04/07/2011   LDLCALC 72 10/07/2010   LDLCALC 78 09/21/2009   Lab Results  Component Value Date   TRIG 81.0 04/07/2011   TRIG 178.0* 10/07/2010   TRIG 248.0* 03/16/2010   Lab Results  Component Value Date   CHOLHDL 3 04/07/2011   CHOLHDL 4 10/07/2010   CHOLHDL 4 03/16/2010   Lab Results  Component Value Date   LDLDIRECT 78.4 03/16/2010   LDLDIRECT 92.3 04/02/2008   LDLDIRECT 80.6 12/01/2006    Drinks a lot of diet cheerwine  Otherwise caff free EKG today shows frequnt pvcs - bigemeny   Patient Active Problem List  Diagnoses  . HYPOTHYROIDISM  . DIABETES MELLITUS, TYPE II  . HYPERLIPIDEMIA  . RESTLESS LEG SYNDROME  . HYPERTENSION  . PREMATURE VENTRICULAR CONTRACTIONS  . HEMORRHOIDS  . ALLERGIC RHINITIS  . ASTHMA  . GERD  . HIATAL HERNIA  . OVERACTIVE BLADDER  . UTI'S, RECURRENT  . POSTMENOPAUSAL STATUS  . DISC DISEASE, CERVICAL  . SLEEP APNEA, SEVERE  . Routine general medical  examination at a health care facility  . Other screening mammogram   Past Medical History  Diagnosis Date  . Allergic rhinitis   . Asthma     As a child   . GERD (gastroesophageal reflux disease)   . Hyperlipidemia   . Hypertension   . Hypothyroidism   . Diabetes mellitus     Type II (2/06 elevated microalbumin)  . Obesity   . OSA (obstructive sleep apnea)   . Frequent UTI     with coital prohylaxis    Past Surgical History  Procedure Date  . Foot surgery   . Endometrial biopsy   . Dilation and curettage of uterus    History  Substance Use Topics  . Smoking status: Never Smoker   . Smokeless tobacco: Not on file  . Alcohol Use: Yes     occasional   Family History  Problem Relation Age of Onset  . Diabetes Mother   . Coronary artery disease Mother   . Hypothyroidism Mother   . Alzheimer's disease Father   . Nephrolithiasis Father   . Hypothyroidism Brother  No Known Allergies Current Outpatient Prescriptions on File Prior to Visit  Medication Sig Dispense Refill  . atorvastatin (LIPITOR) 20 MG tablet Take 1/2 tablet by mouth once a day      . calcium carbonate (CALCIUM 600) 600 MG TABS Take 600 mg by mouth 2 (two) times daily with a meal.        . fosinopril-hydrochlorothiazide (MONOPRIL HCT) 10-12.5 MG per tablet Take 1 tablet by mouth daily.        Marland Kitchen glucose blood test strip 1 each by Other route. Check sugar twice a day as instructed DM 250.0 that is not optimally controlled.       Marland Kitchen HYDROcodone-acetaminophen (VICODIN) 5-500 MG per tablet 1 tablet. 1/2 to 1 tab by mouth three times a day as needed for pain       . loratadine (CLARITIN) 10 MG tablet Take 10 mg by mouth daily.        . meloxicam (MOBIC) 15 MG tablet Take 1 tablet (15 mg total) by mouth daily as needed. with food  30 tablet  2  . metFORMIN (GLUCOPHAGE) 1000 MG tablet Take 1,000 mg by mouth 2 (two) times daily with a meal.        . multivitamin (THERAGRAN) per tablet Take 1 tablet by mouth daily.         Marland Kitchen albuterol (PROVENTIL,VENTOLIN) 90 MCG/ACT inhaler Inhale 2 puffs into the lungs every 4 (four) hours as needed.        . Fluticasone-Salmeterol (ADVAIR DISKUS) 100-50 MCG/DOSE AEPB Inhale 1 puff into the lungs. 2 times a day during allergy season       . hyoscyamine (LEVSIN SL) 0.125 MG SL tablet Place 0.125 mg under the tongue. Place one tablet under tongue once a day as needed.           Review of Systems Review of Systems  Constitutional: Negative for fever, appetite change, fatigue and unexpected weight change.  Eyes: Negative for pain and visual disturbance.  Respiratory: Negative for cough and shortness of breath.   Cardiovascular: Negative.  for cp or palpitations Gastrointestinal: Negative for nausea, diarrhea and constipation.  Genitourinary: Negative for urgency and frequency.  Skin: Negative for pallor. or rash  Neurological: Negative for weakness, light-headedness, numbness and headaches.  Hematological: Negative for adenopathy. Does not bruise/bleed easily.  Psychiatric/Behavioral: Negative for dysphoric mood. The patient is not nervous/anxious.          Objective:   Physical Exam  Constitutional: She appears well-developed and well-nourished. No distress.       overwt and well appearing   HENT:  Head: Normocephalic and atraumatic.  Right Ear: External ear normal.  Left Ear: External ear normal.  Nose: Nose normal.  Mouth/Throat: Oropharynx is clear and moist.  Eyes: Conjunctivae and EOM are normal. Pupils are equal, round, and reactive to light.  Neck: Normal range of motion. Neck supple. No JVD present. Carotid bruit is not present. No thyromegaly present.  Cardiovascular: Normal rate, regular rhythm, normal heart sounds and intact distal pulses.   Pulmonary/Chest: Breath sounds normal. She has no wheezes.  Abdominal: Soft. Bowel sounds are normal. She exhibits no distension, no abdominal bruit and no mass. There is no tenderness.  Genitourinary: No breast  swelling, tenderness, discharge or bleeding.  Musculoskeletal: Normal range of motion. She exhibits no edema and no tenderness.  Lymphadenopathy:    She has no cervical adenopathy.  Neurological: She is alert. She has normal reflexes. No cranial nerve deficit. Coordination normal.  Skin: Skin is warm and dry. No rash noted. No erythema. No pallor.  Psychiatric: She has a normal mood and affect.          Assessment & Plan:

## 2011-04-13 NOTE — Patient Instructions (Addendum)
Stop your glipizide If you are interested in shingles vaccine in future - call your insurance company to see how coverage is and call us to schedule  We will schedule mammogram at check out  Decrease thyroid med to 25 mcg daily  Schedule non fasting labs in 6 weeks for tsh  Follow up with me in 6 months  Please call with the name of your antibiotic so I can fill it

## 2011-04-16 NOTE — Assessment & Plan Note (Signed)
Very good control  Better diet - commended on that  Will continue lipitor

## 2011-04-16 NOTE — Assessment & Plan Note (Signed)
theraputic tsh and no clinical changes No change in dose

## 2011-04-16 NOTE — Assessment & Plan Note (Signed)
Reviewed health habits including diet and exercise and skin cancer prevention Also reviewed health mt list, fam hx and immunizations  Commended on wt loss Rev wellness labs

## 2011-04-16 NOTE — Assessment & Plan Note (Signed)
Stable to improved with good lifestyle opthy up to date Rev labs with pt

## 2011-04-16 NOTE — Assessment & Plan Note (Signed)
Good control No change in med Disc better habits and commended

## 2011-04-20 ENCOUNTER — Other Ambulatory Visit: Payer: Self-pay

## 2011-04-20 MED ORDER — ATORVASTATIN CALCIUM 20 MG PO TABS: 10.0000 mg | ORAL_TABLET | Freq: Every day | ORAL | Status: DC

## 2011-04-20 NOTE — Telephone Encounter (Signed)
Brooke Mitchell [address] request Lipitor 20mg  #15 x11 given.

## 2011-04-21 ENCOUNTER — Other Ambulatory Visit: Payer: Self-pay | Admitting: *Deleted

## 2011-04-21 MED ORDER — ATORVASTATIN CALCIUM 20 MG PO TABS: 10.0000 mg | ORAL_TABLET | Freq: Every day | ORAL | Status: DC

## 2011-04-26 ENCOUNTER — Other Ambulatory Visit: Payer: Self-pay | Admitting: *Deleted

## 2011-04-26 NOTE — Telephone Encounter (Signed)
Pt is asking for a refill on cipro, says she takes one half after intercourse to avoid UTI's.  She doesn't want that on the label, she wants it to read as directed.  Also, she checked with her insurance company regarding zostavax and they will cover it, so nurse visit appt was scheduled.

## 2011-04-26 NOTE — Telephone Encounter (Signed)
Unable to reach pt but spoke with pharmacist and he said last time refilled  Was 02/2010 for Cipro 500mg  with instructions to take 1/2 tablet as directed once daily as needed.

## 2011-04-26 NOTE — Telephone Encounter (Deleted)
Px written for call in   Was unsure what pharmacy

## 2011-04-26 NOTE — Telephone Encounter (Signed)
I don't see it in epic- can you find out from her or pharmacy what mg the cipro is ? - thanks

## 2011-04-28 MED ORDER — CIPROFLOXACIN HCL 500 MG PO TABS: 250.0000 mg | ORAL_TABLET | Freq: Every day | ORAL | Status: AC | PRN

## 2011-04-28 NOTE — Telephone Encounter (Signed)
Spoke with patient's husband and advised him that patient's medication had been refilled. He said he would let the patient know.

## 2011-04-28 NOTE — Telephone Encounter (Signed)
Will refill electronically  

## 2011-04-29 ENCOUNTER — Other Ambulatory Visit: Payer: Self-pay | Admitting: *Deleted

## 2011-04-29 ENCOUNTER — Ambulatory Visit (INDEPENDENT_AMBULATORY_CARE_PROVIDER_SITE_OTHER): Payer: BC Managed Care – PPO | Admitting: Family Medicine

## 2011-04-29 DIAGNOSIS — Z2911 Encounter for prophylactic immunotherapy for respiratory syncytial virus (RSV): Secondary | ICD-10-CM

## 2011-04-29 DIAGNOSIS — Z23 Encounter for immunization: Secondary | ICD-10-CM

## 2011-04-29 MED ORDER — FOSINOPRIL SODIUM-HCTZ 10-12.5 MG PO TABS: 1.0000 | ORAL_TABLET | Freq: Every day | ORAL | Status: DC

## 2011-04-29 MED ORDER — METFORMIN HCL 1000 MG PO TABS: 1000.0000 mg | ORAL_TABLET | Freq: Two times a day (BID) | ORAL | Status: DC

## 2011-04-29 NOTE — Progress Notes (Signed)
Zostavax given to patient today during nurse visit.

## 2011-04-29 NOTE — Progress Notes (Signed)
  Subjective:    Patient ID: Brooke Mitchell, female    DOB: [birth year], 60 y.o.   MRN: [id number]  HPI  Dr. Royden Purl patient here for zostavax.  Review of Systems     Objective:   Physical Exam        Assessment & Plan:

## 2011-05-17 ENCOUNTER — Telehealth: Payer: Self-pay | Admitting: *Deleted

## 2011-05-17 NOTE — Telephone Encounter (Signed)
Pt called to report that since she went off glipizide her blood sugar has been going up.  She's still taking metformin 1000 mg's bid but she has been getting fasting readings of 188, 155, 148.  It was lower at 138 this morning.  Please advise on what she needs to do. Uses [facility] in [place].

## 2011-05-17 NOTE — Telephone Encounter (Signed)
Please go back on the glipizide (unless she was having trouble with it ) - let me know if she needs px I think she was on glucotrol 5 xl daily

## 2011-05-17 NOTE — Telephone Encounter (Signed)
Left v/m on pts home and cell to call back. 

## 2011-05-19 NOTE — Telephone Encounter (Signed)
Pt left v/m for me to call her back at cell or home #. I called back and left v/m at both numbers for pt to call back.

## 2011-05-20 MED ORDER — GLIPIZIDE ER 5 MG PO TB24: 5.0000 mg | ORAL_TABLET | Freq: Every day | ORAL | Status: DC

## 2011-05-20 NOTE — Telephone Encounter (Signed)
Patient notified as instructed by telephone. Pt does want a 30 day supply with refills sent to [facility] in [place]. I added back to med list.

## 2011-05-20 NOTE — Telephone Encounter (Signed)
Will refill electronically  

## 2011-05-20 NOTE — Telephone Encounter (Signed)
Patient notified as instructed by telephone. 

## 2011-05-23 ENCOUNTER — Other Ambulatory Visit: Payer: BC Managed Care – PPO

## 2011-06-01 ENCOUNTER — Ambulatory Visit: Payer: Self-pay | Admitting: Family Medicine

## 2011-06-01 ENCOUNTER — Encounter: Payer: Self-pay | Admitting: Family Medicine

## 2011-06-01 ENCOUNTER — Ambulatory Visit (INDEPENDENT_AMBULATORY_CARE_PROVIDER_SITE_OTHER): Payer: BC Managed Care – PPO | Admitting: Family Medicine

## 2011-06-01 VITALS — BP 104/68 | HR 84 | Temp 97.7°F | Wt 195.2 lb

## 2011-06-01 DIAGNOSIS — R300 Dysuria: Secondary | ICD-10-CM

## 2011-06-01 DIAGNOSIS — R309 Painful micturition, unspecified: Secondary | ICD-10-CM

## 2011-06-01 DIAGNOSIS — N390 Urinary tract infection, site not specified: Secondary | ICD-10-CM

## 2011-06-01 LAB — POCT URINALYSIS DIPSTICK
Glucose, UA: NEGATIVE
Nitrite, UA: NEGATIVE
Urobilinogen, UA: 0.2

## 2011-06-01 MED ORDER — NITROFURANTOIN MONOHYD MACRO 100 MG PO CAPS: 100.0000 mg | ORAL_CAPSULE | Freq: Two times a day (BID) | ORAL | Status: AC

## 2011-06-01 NOTE — Patient Instructions (Signed)
Take Macrobid for ten days. Drink fluids.

## 2011-06-01 NOTE — Assessment & Plan Note (Addendum)
Will treat as her micro is signif for TNTC WBCs and rare RBCs and Epis. Take Macrobid.

## 2011-06-01 NOTE — Progress Notes (Signed)
  Subjective:    Patient ID: Brooke Mitchell, female    DOB: 1951-08-13, 60 y.o.   MRN: [id number]  HPI Pt of Dr Royden Purl here as acute appt for presumed UTI. She has lots of pain when going to the bathroom, has frequency and dribbles with urination. Sxs worsened after lunch today after being improved this AM from yesterday. She denies fever or chills. She has mild back pain. She has had UTIs in the past, last over a year ago. She had been getting frequent UTIs and was taking 1/2 of antibiotic after intercourse, unknown what and not found in the chart. She does not recognize the name Macrobid.     Review of SystemsNoncontributory except as above.       Objective:   Physical Exam  Constitutional: She appears well-developed and well-nourished. No distress.  HENT:  Head: Normocephalic and atraumatic.  Right Ear: External ear normal.  Left Ear: External ear normal.  Nose: Nose normal.  Mouth/Throat: Oropharynx is clear and moist. No oropharyngeal exudate.  Eyes: Conjunctivae and EOM are normal. Pupils are equal, round, and reactive to light.  Neck: Normal range of motion. Neck supple. No thyromegaly present.  Cardiovascular: Normal rate, regular rhythm and normal heart sounds.   Pulmonary/Chest: Effort normal and breath sounds normal. She has no wheezes. She has no rales.  Abdominal:       No suprapubic tenderness.  Genitourinary:       No CVAT.  Lymphadenopathy:    She has no cervical adenopathy.  Skin: She is not diaphoretic.          Assessment & Plan:

## 2011-06-02 ENCOUNTER — Encounter: Payer: Self-pay | Admitting: Family Medicine

## 2011-06-04 LAB — URINE CULTURE: Colony Count: 60000

## 2011-06-06 ENCOUNTER — Encounter: Payer: Self-pay | Admitting: *Deleted

## 2011-06-06 ENCOUNTER — Other Ambulatory Visit (INDEPENDENT_AMBULATORY_CARE_PROVIDER_SITE_OTHER): Payer: BC Managed Care – PPO

## 2011-06-06 DIAGNOSIS — E039 Hypothyroidism, unspecified: Secondary | ICD-10-CM

## 2011-06-06 LAB — HM DIABETES EYE EXAM: HM Diabetic Eye Exam: NORMAL

## 2011-06-30 ENCOUNTER — Other Ambulatory Visit: Payer: Self-pay | Admitting: *Deleted

## 2011-06-30 NOTE — Telephone Encounter (Signed)
I'm confused, did you say just refilled ono 10/25--- ? Too soon

## 2011-06-30 NOTE — Telephone Encounter (Signed)
Faxed request from [facility] [place].  Last filled date was 06/30/11 for # 30.

## 2011-07-01 MED ORDER — MELOXICAM 15 MG PO TABS: 15.0000 mg | ORAL_TABLET | Freq: Every day | ORAL | Status: DC | PRN

## 2011-07-01 NOTE — Telephone Encounter (Signed)
Script was filled yesterday, they are requesting future refills.

## 2011-07-01 NOTE — Telephone Encounter (Signed)
Will refill electronically  

## 2011-08-18 ENCOUNTER — Other Ambulatory Visit: Payer: Self-pay | Admitting: Family Medicine

## 2011-09-30 ENCOUNTER — Other Ambulatory Visit: Payer: Self-pay | Admitting: Family Medicine

## 2011-10-05 ENCOUNTER — Other Ambulatory Visit: Payer: Self-pay | Admitting: *Deleted

## 2011-10-05 MED ORDER — MELOXICAM 15 MG PO TABS: 15.0000 mg | ORAL_TABLET | Freq: Every day | ORAL | Status: DC | PRN

## 2011-10-05 NOTE — Telephone Encounter (Signed)
Will refill electronically  She has f/u with me in feb

## 2011-10-17 ENCOUNTER — Ambulatory Visit: Payer: BC Managed Care – PPO | Admitting: Family Medicine

## 2011-10-18 ENCOUNTER — Ambulatory Visit: Payer: BC Managed Care – PPO | Admitting: Family Medicine

## 2011-10-19 ENCOUNTER — Ambulatory Visit (INDEPENDENT_AMBULATORY_CARE_PROVIDER_SITE_OTHER): Payer: BC Managed Care – PPO | Admitting: Family Medicine

## 2011-10-19 ENCOUNTER — Encounter: Payer: Self-pay | Admitting: Family Medicine

## 2011-10-19 VITALS — BP 124/80 | HR 86 | Temp 98.0°F | Wt 208.0 lb

## 2011-10-19 DIAGNOSIS — I10 Essential (primary) hypertension: Secondary | ICD-10-CM

## 2011-10-19 DIAGNOSIS — E66811 Obesity, class 1: Secondary | ICD-10-CM | POA: Insufficient documentation

## 2011-10-19 DIAGNOSIS — Z6835 Body mass index (BMI) 35.0-35.9, adult: Secondary | ICD-10-CM | POA: Insufficient documentation

## 2011-10-19 DIAGNOSIS — E119 Type 2 diabetes mellitus without complications: Secondary | ICD-10-CM

## 2011-10-19 DIAGNOSIS — E669 Obesity, unspecified: Secondary | ICD-10-CM

## 2011-10-19 LAB — COMPREHENSIVE METABOLIC PANEL
ALT: 16 U/L (ref 0–35)
Albumin: 3.9 g/dL (ref 3.5–5.2)
Alkaline Phosphatase: 45 U/L (ref 39–117)
Glucose, Bld: 99 mg/dL (ref 70–99)
Potassium: 4 mEq/L (ref 3.5–5.1)
Sodium: 141 mEq/L (ref 135–145)
Total Protein: 6.7 g/dL (ref 6.0–8.3)

## 2011-10-19 NOTE — Assessment & Plan Note (Addendum)
Despite wt gain- pt thinks since sugars are stable - a1c should be on target too On glipizide and metformin opthy up to date  Disc low glycemic diet/ exercise and wt loss - and motivated to get back on track If lab ok - f/u 6 mo

## 2011-10-19 NOTE — Progress Notes (Signed)
Subjective:    Patient ID: Brooke Mitchell, female    DOB: 09/01/1951, 61 y.o.   MRN: [id number]  HPI Here for f/u of HTN and DM  Has been doing ok   Wt is up 13 lb with bmi of 35 Has had a lot of stress and really busy- lost sight of goal  Now is back at weight watchers   husb- ALS and father alzheimers   bp is 124/80    Today No cp or palpitations or headaches or edema  No side effects to medicines    Sugars  Lab Results  Component Value Date   HGBA1C 5.6 04/07/2011   was very well controlled then  Sugars have been  Good - high 90s- low 100s  Even when eating more than usual On metformin   Is walking now - and exercise treadmill  Just started Enjoys this  Is more motivated now   Patient Active Problem List  Diagnoses  . HYPOTHYROIDISM  . DIABETES MELLITUS, TYPE II  . HYPERLIPIDEMIA  . RESTLESS LEG SYNDROME  . HYPERTENSION  . PREMATURE VENTRICULAR CONTRACTIONS  . HEMORRHOIDS  . ALLERGIC RHINITIS  . ASTHMA  . GERD  . HIATAL HERNIA  . OVERACTIVE BLADDER  . UTI'S, RECURRENT  . POSTMENOPAUSAL STATUS  . DISC DISEASE, CERVICAL  . SLEEP APNEA, SEVERE  . Routine general medical examination at a health care facility  . Other screening mammogram  . Obesity   Past Medical History  Diagnosis Date  . Allergic rhinitis   . Asthma     As a child   . GERD (gastroesophageal reflux disease)   . Hyperlipidemia   . Hypertension   . Hypothyroidism   . Diabetes mellitus     Type II (2/06 elevated microalbumin)  . Obesity   . OSA (obstructive sleep apnea)   . Frequent UTI     with coital prohylaxis    Past Surgical History  Procedure Date  . Foot surgery   . Endometrial biopsy   . Dilation and curettage of uterus    History  Substance Use Topics  . Smoking status: Never Smoker   . Smokeless tobacco: Never Used  . Alcohol Use: Yes     occasional   Family History  Problem Relation Age of Onset  . Diabetes Mother   . Coronary artery disease Mother   .  Hypothyroidism Mother   . Alzheimer's disease Father   . Nephrolithiasis Father   . Hypothyroidism Brother    No Known Allergies Current Outpatient Prescriptions on File Prior to Visit  Medication Sig Dispense Refill  . albuterol (PROVENTIL,VENTOLIN) 90 MCG/ACT inhaler Inhale 2 puffs into the lungs every 4 (four) hours as needed.        Marland Kitchen aspirin 81 MG tablet Take 81 mg by mouth daily.        Marland Kitchen atorvastatin (LIPITOR) 20 MG tablet Take 0.5 tablets (10 mg total) by mouth daily.  15 tablet  11  . calcium carbonate (CALCIUM 600) 600 MG TABS Take 600 mg by mouth 2 (two) times daily with a meal.        . cyclobenzaprine (FLEXERIL) 10 MG tablet Take 1 tablet (10 mg total) by mouth 3 (three) times daily as needed.  30 tablet  3  . Fluticasone-Salmeterol (ADVAIR DISKUS) 100-50 MCG/DOSE AEPB Inhale 1 puff into the lungs. 2 times a day during allergy season       . fosinopril-hydrochlorothiazide (MONOPRIL HCT) 10-12.5 MG per tablet Take 1  tablet by mouth daily.  30 tablet  11  . glipiZIDE (GLUCOTROL XL) 5 MG 24 hr tablet Take 1 tablet (5 mg total) by mouth daily.  30 tablet  11  . HYDROcodone-acetaminophen (VICODIN) 5-500 MG per tablet 1 tablet. 1/2 to 1 tab by mouth three times a day as needed for pain       . hyoscyamine (LEVSIN SL) 0.125 MG SL tablet Place 0.125 mg under the tongue. Place one tablet under tongue once a day as needed.       Marland Kitchen levothyroxine (LEVOTHROID) 25 MCG tablet Take 1 tablet (25 mcg total) by mouth daily.  30 tablet  11  . loratadine (CLARITIN) 10 MG tablet Take 10 mg by mouth daily.        . meloxicam (MOBIC) 15 MG tablet Take 1 tablet (15 mg total) by mouth daily as needed (with food). with food  30 tablet  5  . metFORMIN (GLUCOPHAGE) 1000 MG tablet Take 1 tablet (1,000 mg total) by mouth 2 (two) times daily with a meal.  60 tablet  11  . multivitamin (THERAGRAN) per tablet Take 1 tablet by mouth daily.        . ONE TOUCH ULTRA TEST test strip CHECK SUGAR TWICE A DAY AS  INSTRUCTED DM 250.0 THAT IS NOT OPTIMALLY CONTROLLED  100 each  3       Review of Systems Review of Systems  Constitutional: Negative for fever, appetite change, and unexpected weight change. Pos for fatigue from busy schedule Eyes: Negative for pain and visual disturbance.  Respiratory: Negative for cough and shortness of breath.   Cardiovascular: Negative for cp or palpitations    Gastrointestinal: Negative for nausea, diarrhea and constipation.  Genitourinary: Negative for urgency and frequency.  Skin: Negative for pallor or rash   Neurological: Negative for weakness, light-headedness, numbness and headaches.  Hematological: Negative for adenopathy. Does not bruise/bleed easily.  Psychiatric/Behavioral: Negative for dysphoric mood. The patient is not nervous/anxious.  admits to a lot of stress        Objective:   Physical Exam  Constitutional: She appears well-developed and well-nourished. No distress.       Obese and well appearing   HENT:  Head: Normocephalic and atraumatic.  Mouth/Throat: Oropharynx is clear and moist.  Eyes: Conjunctivae and EOM are normal. Pupils are equal, round, and reactive to light. No scleral icterus.  Neck: Normal range of motion. Neck supple. No JVD present. Carotid bruit is not present. Erythema present. No thyromegaly present.  Cardiovascular: Normal rate, regular rhythm, normal heart sounds and intact distal pulses.  Exam reveals no gallop.   Pulmonary/Chest: Effort normal and breath sounds normal. No respiratory distress. She has no wheezes.  Abdominal: Soft. Bowel sounds are normal. She exhibits no distension, no abdominal bruit and no mass.  Musculoskeletal: Normal range of motion. She exhibits no edema and no tenderness.  Lymphadenopathy:    She has no cervical adenopathy.  Neurological: She is alert. She has normal reflexes. No cranial nerve deficit. She exhibits normal muscle tone. Coordination normal.  Skin: Skin is warm and dry. No rash  noted. No erythema. No pallor.  Psychiatric: She has a normal mood and affect. Her speech is normal. Cognition and memory are normal.       Seems generally stressed and fatigued but not tearful  Good eye contact and comm skills           Assessment & Plan:

## 2011-10-19 NOTE — Patient Instructions (Signed)
Lab today  I'm hoping your a1c will be stable Keep working on diet and exercise and weight loss  Follow up in 6 months for annual exam with labs prior

## 2011-10-19 NOTE — Assessment & Plan Note (Signed)
Wt gain with recent stress but now motivated for change Discussed how this problem influences overall health and the risks it imposes  Reviewed plan for weight loss with lower calorie diet (via better food choices and also portion control or program like weight watchers) and exercise building up to or more than 30 minutes 5 days per week including some aerobic activity

## 2011-10-19 NOTE — Assessment & Plan Note (Signed)
bp in fair control at this time  No changes needed  Disc lifstyle change with low sodium diet and exercise   

## 2011-11-14 ENCOUNTER — Encounter: Payer: Self-pay | Admitting: Family Medicine

## 2011-11-14 ENCOUNTER — Ambulatory Visit (INDEPENDENT_AMBULATORY_CARE_PROVIDER_SITE_OTHER): Payer: BC Managed Care – PPO | Admitting: Family Medicine

## 2011-11-14 VITALS — BP 124/74 | HR 92 | Temp 98.0°F | Ht 64.25 in | Wt 209.8 lb

## 2011-11-14 DIAGNOSIS — M76899 Other specified enthesopathies of unspecified lower limb, excluding foot: Secondary | ICD-10-CM

## 2011-11-14 DIAGNOSIS — M7062 Trochanteric bursitis, left hip: Secondary | ICD-10-CM | POA: Insufficient documentation

## 2011-11-14 MED ORDER — HYDROCODONE-ACETAMINOPHEN 5-500 MG PO TABS: 1.0000 | ORAL_TABLET | Freq: Four times a day (QID) | ORAL | Status: DC | PRN

## 2011-11-14 NOTE — Progress Notes (Signed)
Subjective:    Patient ID: Brooke Mitchell, female    DOB: 1951-02-25, 61 y.o.   MRN: [id number]  HPI Here for L leg pain  Off and on thru the years  Much worse for 4-5 days  Dull ache  Started using recumbant bike and also walking- and that was going well   Is just down from the hip Will wake her up from sleep  Much worse if lying on it  No hx of hip bursitis   Sat was bad with walking  A little sore to the touch  Yesterday - better in day and worse at night No swelling / redness/ heat    No medicines except 2 tylenol - took the edge off a bit  Back is not hurting   Patient Active Problem List  Diagnoses  . HYPOTHYROIDISM  . DIABETES MELLITUS, TYPE II  . HYPERLIPIDEMIA  . RESTLESS LEG SYNDROME  . HYPERTENSION  . PREMATURE VENTRICULAR CONTRACTIONS  . HEMORRHOIDS  . ALLERGIC RHINITIS  . ASTHMA  . GERD  . HIATAL HERNIA  . OVERACTIVE BLADDER  . UTI'S, RECURRENT  . POSTMENOPAUSAL STATUS  . DISC DISEASE, CERVICAL  . SLEEP APNEA, SEVERE  . Routine general medical examination at a health care facility  . Other screening mammogram  . Obesity  . Trochanteric bursitis of left hip   Past Medical History  Diagnosis Date  . Allergic rhinitis   . Asthma     As a child   . GERD (gastroesophageal reflux disease)   . Hyperlipidemia   . Hypertension   . Hypothyroidism   . Diabetes mellitus     Type II (2/06 elevated microalbumin)  . Obesity   . OSA (obstructive sleep apnea)   . Frequent UTI     with coital prohylaxis    Past Surgical History  Procedure Date  . Foot surgery   . Endometrial biopsy   . Dilation and curettage of uterus    History  Substance Use Topics  . Smoking status: Never Smoker   . Smokeless tobacco: Never Used  . Alcohol Use: Yes     occasional   Family History  Problem Relation Age of Onset  . Diabetes Mother   . Coronary artery disease Mother   . Hypothyroidism Mother   . Alzheimer's disease Father   . Nephrolithiasis Father   .  Hypothyroidism Brother    No Known Allergies Current Outpatient Prescriptions on File Prior to Visit  Medication Sig Dispense Refill  . albuterol (PROVENTIL,VENTOLIN) 90 MCG/ACT inhaler Inhale 2 puffs into the lungs every 4 (four) hours as needed.        Marland Kitchen aspirin 81 MG tablet Take 81 mg by mouth daily.        Marland Kitchen atorvastatin (LIPITOR) 20 MG tablet Take 0.5 tablets (10 mg total) by mouth daily.  15 tablet  11  . calcium carbonate (CALCIUM 600) 600 MG TABS Take 600 mg by mouth 2 (two) times daily with a meal.        . cyclobenzaprine (FLEXERIL) 10 MG tablet Take 1 tablet (10 mg total) by mouth 3 (three) times daily as needed.  30 tablet  3  . Fluticasone-Salmeterol (ADVAIR DISKUS) 100-50 MCG/DOSE AEPB Inhale 1 puff into the lungs. 2 times a day during allergy season       . fosinopril-hydrochlorothiazide (MONOPRIL HCT) 10-12.5 MG per tablet Take 1 tablet by mouth daily.  30 tablet  11  . glipiZIDE (GLUCOTROL XL) 5 MG 24 hr  tablet Take 1 tablet (5 mg total) by mouth daily.  30 tablet  11  . hyoscyamine (LEVSIN SL) 0.125 MG SL tablet Place 0.125 mg under the tongue. Place one tablet under tongue once a day as needed.       Marland Kitchen levothyroxine (LEVOTHROID) 25 MCG tablet Take 1 tablet (25 mcg total) by mouth daily.  30 tablet  11  . loratadine (CLARITIN) 10 MG tablet Take 10 mg by mouth daily.        . meloxicam (MOBIC) 15 MG tablet Take 1 tablet (15 mg total) by mouth daily as needed (with food). with food  30 tablet  5  . metFORMIN (GLUCOPHAGE) 1000 MG tablet Take 1 tablet (1,000 mg total) by mouth 2 (two) times daily with a meal.  60 tablet  11  . multivitamin (THERAGRAN) per tablet Take 1 tablet by mouth daily.        . ONE TOUCH ULTRA TEST test strip CHECK SUGAR TWICE A DAY AS INSTRUCTED DM 250.0 THAT IS NOT OPTIMALLY CONTROLLED  100 each  3      Review of Systems Review of Systems  Constitutional: Negative for fever, appetite change, fatigue and unexpected weight change.  Eyes: Negative for  pain and visual disturbance.  Respiratory: Negative for cough and shortness of breath.   Cardiovascular: Negative for cp or palpitations    Gastrointestinal: Negative for nausea, diarrhea and constipation.  Genitourinary: Negative for urgency and frequency.  Skin: Negative for pallor or rash   MSK pos for hip/ leg pain, neg for joint swelling or redness Neurological: Negative for weakness, light-headedness, numbness and headaches.  Hematological: Negative for adenopathy. Does not bruise/bleed easily.  Psychiatric/Behavioral: Negative for dysphoric mood. The patient is not nervous/anxious.          Objective:   Physical Exam  Constitutional: She appears well-developed and well-nourished. No distress.       Obese and well app  Eyes: Conjunctivae and EOM are normal. Pupils are equal, round, and reactive to light.  Neck: Normal range of motion.  Cardiovascular: Normal rate and regular rhythm.   Pulmonary/Chest: Effort normal and breath sounds normal.  Musculoskeletal: She exhibits tenderness. She exhibits no edema.       Left hip: She exhibits decreased range of motion, tenderness and bony tenderness. She exhibits normal strength, no swelling, no crepitus, no deformity and no laceration.       L hip Pain on full int/ext rotation - laterally  Tender over greater trochanter No skin changes or swelling  Neg SLR No LS tenderness  Neurological: She is alert. She has normal reflexes. No cranial nerve deficit. She exhibits normal muscle tone. Coordination normal.  Skin: Skin is warm and dry. No rash noted. No erythema.  Psychiatric: She has a normal mood and affect.          Assessment & Plan:

## 2011-11-14 NOTE — Patient Instructions (Addendum)
I think you have trochanteric (hip) bursitis Ice painful area for 10 minutes at a time whenever you can  Avoid activities that make it hurt Do not sleep on left side  Take mobic daily Take vicodin at night if needed  (not within 4 hours of taking tylenol)  We will schedule appt with Dr Patsy Lager at check out

## 2011-11-14 NOTE — Assessment & Plan Note (Addendum)
Possibly from lifetime of sleeping on L side or new exercise/ walking  Already on mobic 15 mg daily Will add vicodin for severe nightime pain - has taken this before  Ice 10 min on and off  Given handout with some info and stretches  Ref to Dr Patsy Lager for eval and likely injection

## 2011-11-28 ENCOUNTER — Encounter: Payer: Self-pay | Admitting: Family Medicine

## 2011-11-28 ENCOUNTER — Ambulatory Visit (INDEPENDENT_AMBULATORY_CARE_PROVIDER_SITE_OTHER): Payer: BC Managed Care – PPO | Admitting: Family Medicine

## 2011-11-28 VITALS — BP 120/84 | HR 90 | Temp 98.0°F | Ht 65.0 in | Wt 206.1 lb

## 2011-11-28 DIAGNOSIS — M76899 Other specified enthesopathies of unspecified lower limb, excluding foot: Secondary | ICD-10-CM

## 2011-11-28 DIAGNOSIS — M7062 Trochanteric bursitis, left hip: Secondary | ICD-10-CM

## 2011-11-28 MED ORDER — TRAMADOL HCL 50 MG PO TABS: 50.0000 mg | ORAL_TABLET | Freq: Four times a day (QID) | ORAL | Status: DC | PRN

## 2011-11-28 NOTE — Progress Notes (Signed)
  Patient Name: Brooke Mitchell Date of Birth: 08-19-1951 Age: 61 y.o. Medical Record Number: [id number] Gender: female Date of Encounter: 11/28/2011  History of Present Illness:  Brooke Mitchell is a 61 y.o. very pleasant female patient who presents with the following:  When saw Dr. Milinda Antis about aweek ago, had a lot of lateral pin in here leg. Now sleeping on both sides but is doing better. Not taking much pain medication.   Also with some occ lower extremity pain.  No groin pain.  She was primarily having some LEFT lateral hip pain. This is been improving over the last week to 10 days after she saw her PCP. At this point she is minimally symptomatic. No back pain. No radiculopathy. No groin pain. Occasional knee pain.  Past Medical History, Surgical History, Social History, Family History, Problem List, Medications, and Allergies have been reviewed and updated if relevant.  Review of Systems:  GEN: No fevers, chills. Nontoxic. Primarily MSK c/o today. MSK: Detailed in the HPI GI: tolerating PO intake without difficulty Neuro: No numbness, parasthesias, or tingling associated. Otherwise the pertinent positives of the ROS are noted above.    Physical Examination: Filed Vitals:   11/28/11 1155  BP: 120/84  Pulse: 90  Temp: 98 F (36.7 C)  TempSrc: Oral  Height: 5\' 5"  (1.651 m)  Weight: 206 lb 1.9 oz (93.495 kg)  SpO2: 98%    Body mass index is 34.30 kg/(m^2).   GEN: WDWN, NAD, Non-toxic, Alert & Oriented x 3 HEENT: Atraumatic, Normocephalic.  Ears and Nose: No external deformity. EXTR: No clubbing/cyanosis/edema NEURO: Normal gait.  PSYCH: Normally interactive. Conversant. Not depressed or anxious appearing.  Calm demeanor.   HIP EXAM: SIDE: L ROM: Abduction, Flexion, Internal and External range of motion: full Pain with terminal IROM and EROM: none GUY:QIHKVQQ to none SLR: NEG Knees: No effusion FABER: NT REVERSE FABER: NT, neg Piriformis: NT at direct  palpation Str: flexion: 5/5 abduction: 4/5 (L compared to R 5/5) adduction: 5/5 Strength testing non-tender     Assessment and Plan: 1. Greater trochanteric bursitis of left hip  traMADol (ULTRAM) 50 MG tablet    She is mostly better. I reviewed some hip strengthening and stability exercises with her. I think this will be of greater importance over the long term.  Tramadol p.r.n. Pain in addition to Tylenol p.r.n. Pain.  She's will followup on a p.r.n. Basis if she worsens

## 2011-11-28 NOTE — Patient Instructions (Signed)
Trochanteric Bursa Rehab  Stretches: Cross Leg, "sink stretch" - can do all day during work day Cross over stretch, laying down, hold 5-10 sec x 3  Hip Abduction - build to 3 sets of 30 and then add weights begin with no weight. When 3 x 30 reached, Add 2 lb. ankle weight. Increase to 3,4,5,6 when 3x30 achieved.  Knee up and open hip: knee up and externally rotate hip to open position, hold 2 sec and repeat each leg, 30 reps   

## 2012-03-19 ENCOUNTER — Other Ambulatory Visit: Payer: Self-pay | Admitting: Family Medicine

## 2012-03-19 NOTE — Telephone Encounter (Signed)
Will refill electronically  

## 2012-03-19 NOTE — Telephone Encounter (Signed)
Ok to refill 

## 2012-03-29 ENCOUNTER — Other Ambulatory Visit: Payer: Self-pay | Admitting: Family Medicine

## 2012-03-29 NOTE — Telephone Encounter (Signed)
Request for Monopril-HCT 10-12.5mg . Patient has upcoming CPE for 04/13/2012.ok to reill?

## 2012-03-29 NOTE — Telephone Encounter (Signed)
Will refill electronically  

## 2012-04-03 ENCOUNTER — Other Ambulatory Visit: Payer: Self-pay | Admitting: Family Medicine

## 2012-04-03 NOTE — Telephone Encounter (Signed)
Electronic refill request

## 2012-04-04 NOTE — Telephone Encounter (Signed)
Will refill electronically  

## 2012-04-09 ENCOUNTER — Telehealth: Payer: Self-pay | Admitting: Family Medicine

## 2012-04-09 DIAGNOSIS — E785 Hyperlipidemia, unspecified: Secondary | ICD-10-CM

## 2012-04-09 DIAGNOSIS — Z0000 Encounter for general adult medical examination without abnormal findings: Secondary | ICD-10-CM

## 2012-04-09 DIAGNOSIS — E119 Type 2 diabetes mellitus without complications: Secondary | ICD-10-CM

## 2012-04-09 NOTE — Telephone Encounter (Signed)
Message copied by Judy Pimple on Mon Apr 09, 2012  8:48 PM ------      Message from: Alvina Chou      Created: Tue Apr 03, 2012  4:58 PM      Regarding: Lab orders for Tuesday, 8.6.13       Patient is scheduled for CPX labs, please order future labs, Thanks , Camelia Eng

## 2012-04-10 ENCOUNTER — Other Ambulatory Visit (INDEPENDENT_AMBULATORY_CARE_PROVIDER_SITE_OTHER): Payer: BC Managed Care – PPO

## 2012-04-10 DIAGNOSIS — E785 Hyperlipidemia, unspecified: Secondary | ICD-10-CM

## 2012-04-10 DIAGNOSIS — Z0000 Encounter for general adult medical examination without abnormal findings: Secondary | ICD-10-CM

## 2012-04-10 DIAGNOSIS — E119 Type 2 diabetes mellitus without complications: Secondary | ICD-10-CM

## 2012-04-10 LAB — COMPREHENSIVE METABOLIC PANEL
ALT: 21 U/L (ref 0–35)
AST: 20 U/L (ref 0–37)
CO2: 28 mEq/L (ref 19–32)
Calcium: 9 mg/dL (ref 8.4–10.5)
Chloride: 103 mEq/L (ref 96–112)
Creatinine, Ser: 0.8 mg/dL (ref 0.4–1.2)
Sodium: 142 mEq/L (ref 135–145)
Total Protein: 6.9 g/dL (ref 6.0–8.3)

## 2012-04-10 LAB — CBC WITH DIFFERENTIAL/PLATELET
Basophils Absolute: 0 10*3/uL (ref 0.0–0.1)
Eosinophils Absolute: 0.1 10*3/uL (ref 0.0–0.7)
Hemoglobin: 12 g/dL (ref 12.0–15.0)
Lymphocytes Relative: 29.4 % (ref 12.0–46.0)
MCHC: 33.3 g/dL (ref 30.0–36.0)
Monocytes Relative: 8.2 % (ref 3.0–12.0)
Neutro Abs: 3.3 10*3/uL (ref 1.4–7.7)
Neutrophils Relative %: 59.8 % (ref 43.0–77.0)
Platelets: 224 10*3/uL (ref 150.0–400.0)
RDW: 13.4 % (ref 11.5–14.6)

## 2012-04-10 LAB — LIPID PANEL
HDL: 46.6 mg/dL (ref >39.00)
LDL Cholesterol: 79 mg/dL (ref 0–99)
Total CHOL/HDL Ratio: 3
Triglycerides: 142 mg/dL (ref 0.0–149.0)

## 2012-04-10 LAB — TSH: TSH: 5.45 u[IU]/mL (ref 0.35–5.50)

## 2012-04-13 ENCOUNTER — Ambulatory Visit (INDEPENDENT_AMBULATORY_CARE_PROVIDER_SITE_OTHER): Payer: BC Managed Care – PPO | Admitting: Family Medicine

## 2012-04-13 ENCOUNTER — Other Ambulatory Visit: Payer: Self-pay | Admitting: Family Medicine

## 2012-04-13 ENCOUNTER — Encounter: Payer: Self-pay | Admitting: Family Medicine

## 2012-04-13 VITALS — BP 118/64 | HR 95 | Temp 98.5°F | Ht 65.0 in | Wt 214.0 lb

## 2012-04-13 DIAGNOSIS — I10 Essential (primary) hypertension: Secondary | ICD-10-CM

## 2012-04-13 DIAGNOSIS — E785 Hyperlipidemia, unspecified: Secondary | ICD-10-CM

## 2012-04-13 DIAGNOSIS — Z1231 Encounter for screening mammogram for malignant neoplasm of breast: Secondary | ICD-10-CM

## 2012-04-13 DIAGNOSIS — E669 Obesity, unspecified: Secondary | ICD-10-CM

## 2012-04-13 DIAGNOSIS — Z0000 Encounter for general adult medical examination without abnormal findings: Secondary | ICD-10-CM

## 2012-04-13 DIAGNOSIS — E039 Hypothyroidism, unspecified: Secondary | ICD-10-CM

## 2012-04-13 DIAGNOSIS — E119 Type 2 diabetes mellitus without complications: Secondary | ICD-10-CM

## 2012-04-13 MED ORDER — FOSINOPRIL SODIUM-HCTZ 10-12.5 MG PO TABS: 1.0000 | ORAL_TABLET | Freq: Every day | ORAL | Status: DC

## 2012-04-13 MED ORDER — GLIPIZIDE ER 5 MG PO TB24: 5.0000 mg | ORAL_TABLET | Freq: Every day | ORAL | Status: DC

## 2012-04-13 MED ORDER — METFORMIN HCL 1000 MG PO TABS: 1000.0000 mg | ORAL_TABLET | Freq: Two times a day (BID) | ORAL | Status: DC

## 2012-04-13 MED ORDER — ATORVASTATIN CALCIUM 20 MG PO TABS: 10.0000 mg | ORAL_TABLET | Freq: Every day | ORAL | Status: DC

## 2012-04-13 NOTE — Assessment & Plan Note (Signed)
bp in fair control at this time  No changes needed  Disc lifstyle change with low sodium diet and exercise   Rev labs 

## 2012-04-13 NOTE — Assessment & Plan Note (Signed)
Discussed how this problem influences overall health and the risks it imposes  Reviewed plan for weight loss with lower calorie diet (via better food choices and also portion control or program like weight watchers) and exercise building up to or more than 30 minutes 5 days per week including some aerobic activity    

## 2012-04-13 NOTE — Progress Notes (Signed)
Subjective:    Patient ID: Brooke Mitchell, female    DOB: 04-09-1951, 61 y.o.   MRN: [id number]  HPI Here for health maintenance exam and to review chronic medical problems    Caregiver to husband and her father  Is stressful No time for self care   Shoulders and back stay tense - due to that  More aches and pains   mammo 9/12- goes to [facility] Self exam-no lumps   Pap 8/11 No hx of abn pap No bleeding  No problems or new partners  Post menop  Wt is up 8 lb with bmi of 35 No time for self care  Very difficult - has to feel husband high calorie foods  Is walking the dog 2-3 times per day-this is most helpful   colonosc 11/04  HTN -bp well controlled at 118/64  DM- a1c is 6.3 up a bit from 5.9= on glipizide and metformin Needs to stick to diet  Closer   Hypothyroid  Lab Results  Component Value Date   TSH 5.45 04/10/2012   Lipids Lab Results  Component Value Date   CHOL 154 04/10/2012   CHOL 127 04/07/2011   CHOL 146 10/07/2010   Lab Results  Component Value Date   HDL 46.60 04/10/2012   HDL 43.00 04/07/2011   HDL 38.60* 10/07/2010   Lab Results  Component Value Date   LDLCALC 79 04/10/2012   LDLCALC 68 04/07/2011   LDLCALC 72 10/07/2010   Lab Results  Component Value Date   TRIG 142.0 04/10/2012   TRIG 81.0 04/07/2011   TRIG 178.0* 10/07/2010   Lab Results  Component Value Date   CHOLHDL 3 04/10/2012   CHOLHDL 3 04/07/2011   CHOLHDL 4 10/07/2010   Lab Results  Component Value Date   LDLDIRECT 78.4 03/16/2010   LDLDIRECT 92.3 04/02/2008   LDLDIRECT 80.6 12/01/2006   statin and diet  Pretty good and stable overall   Patient Active Problem List  Diagnosis  . HYPOTHYROIDISM  . DIABETES MELLITUS, TYPE II  . HYPERLIPIDEMIA  . RESTLESS LEG SYNDROME  . HYPERTENSION  . PREMATURE VENTRICULAR CONTRACTIONS  . HEMORRHOIDS  . ALLERGIC RHINITIS  . ASTHMA  . GERD  . HIATAL HERNIA  . OVERACTIVE BLADDER  . UTI'S, RECURRENT  . POSTMENOPAUSAL STATUS  . DISC DISEASE, CERVICAL  .  SLEEP APNEA, SEVERE  . Routine general medical examination at a health care facility  . Other screening mammogram  . Obesity  . Trochanteric bursitis of left hip   Past Medical History  Diagnosis Date  . Allergic rhinitis   . Asthma     As a child   . GERD (gastroesophageal reflux disease)   . Hyperlipidemia   . Hypertension   . Hypothyroidism   . Diabetes mellitus     Type II (2/06 elevated microalbumin)  . Obesity   . OSA (obstructive sleep apnea)   . Frequent UTI     with coital prohylaxis    Past Surgical History  Procedure Date  . Foot surgery   . Endometrial biopsy   . Dilation and curettage of uterus    History  Substance Use Topics  . Smoking status: Never Smoker   . Smokeless tobacco: Never Used  . Alcohol Use: Yes     occasional   Family History  Problem Relation Age of Onset  . Diabetes Mother   . Coronary artery disease Mother   . Hypothyroidism Mother   . Alzheimer's disease Father   .  Nephrolithiasis Father   . Hypothyroidism Brother    No Known Allergies Current Outpatient Prescriptions on File Prior to Visit  Medication Sig Dispense Refill  . albuterol (PROVENTIL,VENTOLIN) 90 MCG/ACT inhaler Inhale 2 puffs into the lungs every 4 (four) hours as needed.        Marland Kitchen aspirin 81 MG tablet Take 81 mg by mouth daily.        Marland Kitchen atorvastatin (LIPITOR) 20 MG tablet Take 0.5 tablets (10 mg total) by mouth daily.  15 tablet  11  . calcium carbonate (CALCIUM 600) 600 MG TABS Take 600 mg by mouth 2 (two) times daily with a meal.        . cyclobenzaprine (FLEXERIL) 10 MG tablet Take 1 tablet (10 mg total) by mouth 3 (three) times daily as needed.  30 tablet  3  . Fluticasone-Salmeterol (ADVAIR DISKUS) 100-50 MCG/DOSE AEPB Inhale 1 puff into the lungs. 2 times a day during allergy season       . fosinopril-hydrochlorothiazide (MONOPRIL-HCT) 10-12.5 MG per tablet Take 1 tablet by mouth daily.  30 tablet  11  . glipiZIDE (GLUCOTROL XL) 5 MG 24 hr tablet Take 1 tablet  (5 mg total) by mouth daily.  30 tablet  11  . hyoscyamine (LEVSIN SL) 0.125 MG SL tablet Place 0.125 mg under the tongue. Place one tablet under tongue once a day as needed.       . loratadine (CLARITIN) 10 MG tablet Take 10 mg by mouth daily.        . meloxicam (MOBIC) 15 MG tablet Take 1 tablet (15 mg total) by mouth daily as needed (with food). with food  30 tablet  5  . metFORMIN (GLUCOPHAGE) 1000 MG tablet Take 1 tablet (1,000 mg total) by mouth 2 (two) times daily with a meal.  60 tablet  11  . multivitamin (THERAGRAN) per tablet Take 1 tablet by mouth daily.        . ONE TOUCH ULTRA TEST test strip CHECK SUGAR TWICE A DAY AS INSTRUCTED DM 250.0 THAT IS NOT OPTIMALLY CONTROLLED  100 each  3  . SYNTHROID 25 MCG tablet TAKE 1 TABLET (25 MCG TOTAL) BY MOUTH DAILY.  30 tablet  11  . HYDROcodone-acetaminophen (VICODIN) 5-500 MG per tablet Take 1 tablet by mouth every 6 (six) hours as needed for pain. 1/2 to 1 tab by mouth three times a day as needed for pain  30 tablet  0      Review of Systems Review of Systems  Constitutional: Negative for fever, appetite change, fatigue and unexpected weight change.  Eyes: Negative for pain and visual disturbance.  Respiratory: Negative for cough and shortness of breath.   Cardiovascular: Negative for cp or palpitations    Gastrointestinal: Negative for nausea, diarrhea and constipation.  Genitourinary: Negative for urgency and frequency.  Skin: Negative for pallor or rash   Neurological: Negative for weakness, light-headedness, numbness and headaches.  Hematological: Negative for adenopathy. Does not bruise/bleed easily.  Psychiatric/Behavioral: Negative for dysphoric mood. The patient is not nervous/anxious.         Objective:   Physical Exam  Constitutional: She appears well-developed and well-nourished. No distress.       obese and well appearing   HENT:  Head: Normocephalic and atraumatic.  Right Ear: External ear normal.  Left Ear:  External ear normal.  Nose: Nose normal.  Mouth/Throat: Oropharynx is clear and moist.  Eyes: Conjunctivae and EOM are normal. Pupils are equal, round, and reactive  to light. No scleral icterus.  Neck: Normal range of motion. Neck supple. No JVD present. Carotid bruit is not present. No thyromegaly present.  Cardiovascular: Normal rate, regular rhythm, normal heart sounds and intact distal pulses.  Exam reveals no gallop.   Pulmonary/Chest: Effort normal and breath sounds normal. No respiratory distress. She has no wheezes.  Abdominal: Soft. Bowel sounds are normal. She exhibits no distension, no abdominal bruit and no mass. There is no tenderness.  Genitourinary: No breast swelling, tenderness, discharge or bleeding.       Breast exam: No mass, nodules, thickening, tenderness, bulging, retraction, inflamation, nipple discharge or skin changes noted.  No axillary or clavicular LA.  Chaperoned exam.    Musculoskeletal: Normal range of motion. She exhibits no edema and no tenderness.  Lymphadenopathy:    She has no cervical adenopathy.  Neurological: She is alert. She has normal reflexes. No cranial nerve deficit. She exhibits normal muscle tone. Coordination normal.  Skin: Skin is warm and dry. No rash noted. No erythema. No pallor.  Psychiatric: She has a normal mood and affect.          Assessment & Plan:

## 2012-04-13 NOTE — Assessment & Plan Note (Signed)
Stable on statin and diet Disc goals for lipids and reasons to control them Rev labs with pt Rev low sat fat diet in detail  

## 2012-04-13 NOTE — Assessment & Plan Note (Signed)
Worse control with poor eating Will get back on track  utd otherwise meds refilled Rev low glycemic diet

## 2012-04-13 NOTE — Assessment & Plan Note (Signed)
Reviewed health habits including diet and exercise and skin cancer prevention Also reviewed health mt list, fam hx and immunizations   Wellness labs reviewed  

## 2012-04-13 NOTE — Assessment & Plan Note (Signed)
tsh theraputic No clinical change but wt gain (from poor eating habits)

## 2012-04-13 NOTE — Patient Instructions (Addendum)
We will do referral for mammogram at check out  Keep working on healthy diabetic diet  Also keep walking for exercise  Follow up 6 months with labs prior  No change in medicines

## 2012-04-13 NOTE — Assessment & Plan Note (Signed)
Scheduled annual screening mammogram Nl breast exam today  Encouraged monthly self exams   

## 2012-04-20 ENCOUNTER — Other Ambulatory Visit: Payer: Self-pay | Admitting: Family Medicine

## 2012-04-20 NOTE — Telephone Encounter (Signed)
Approved.  

## 2012-04-27 ENCOUNTER — Other Ambulatory Visit: Payer: Self-pay | Admitting: Family Medicine

## 2012-05-14 ENCOUNTER — Ambulatory Visit (INDEPENDENT_AMBULATORY_CARE_PROVIDER_SITE_OTHER): Payer: BC Managed Care – PPO | Admitting: Family Medicine

## 2012-05-14 ENCOUNTER — Encounter: Payer: Self-pay | Admitting: Family Medicine

## 2012-05-14 VITALS — BP 112/70 | HR 88 | Temp 98.3°F | Wt 214.0 lb

## 2012-05-14 DIAGNOSIS — IMO0001 Reserved for inherently not codable concepts without codable children: Secondary | ICD-10-CM

## 2012-05-14 DIAGNOSIS — W57XXXA Bitten or stung by nonvenomous insect and other nonvenomous arthropods, initial encounter: Secondary | ICD-10-CM

## 2012-05-14 DIAGNOSIS — T148XXA Other injury of unspecified body region, initial encounter: Secondary | ICD-10-CM

## 2012-05-14 NOTE — Assessment & Plan Note (Signed)
This is much improved with prednisone and keflex - right medial leg Will continue/ finish the course of these (aware sugar will be high) Elevation/ warm compress if needed Update if not starting to improve in a week or if worsening

## 2012-05-14 NOTE — Progress Notes (Signed)
Subjective:    Patient ID: Brooke Mitchell, female    DOB: 07-Jul-1951, 61 y.o.   MRN: [id number]  HPI Here for a rash  On Friday-was washing her car Stepped off the pavement and had immed pain in bottom of her foot --- suspects she was stung  By mid afternoon the next day -- redness on foot all the way up her leg (right)  Of note- 2 wk ago had shingles   Went to walk IN clinic on Sunday- suspect insect bite reaction and given prednisone and abx and keflex Is improved much  Is sore and itchy , but improved from prev  Patient Active Problem List  Diagnosis  . HYPOTHYROIDISM  . DIABETES MELLITUS, TYPE II  . HYPERLIPIDEMIA  . RESTLESS LEG SYNDROME  . HYPERTENSION  . PREMATURE VENTRICULAR CONTRACTIONS  . HEMORRHOIDS  . ALLERGIC RHINITIS  . ASTHMA  . GERD  . HIATAL HERNIA  . OVERACTIVE BLADDER  . UTI'S, RECURRENT  . POSTMENOPAUSAL STATUS  . DISC DISEASE, CERVICAL  . SLEEP APNEA, SEVERE  . Routine general medical examination at a health care facility  . Other screening mammogram  . Obesity  . Trochanteric bursitis of left hip   Past Medical History  Diagnosis Date  . Allergic rhinitis   . Asthma     As a child   . GERD (gastroesophageal reflux disease)   . Hyperlipidemia   . Hypertension   . Hypothyroidism   . Diabetes mellitus     Type II (2/06 elevated microalbumin)  . Obesity   . OSA (obstructive sleep apnea)   . Frequent UTI     with coital prohylaxis    Past Surgical History  Procedure Date  . Foot surgery   . Endometrial biopsy   . Dilation and curettage of uterus    History  Substance Use Topics  . Smoking status: Never Smoker   . Smokeless tobacco: Never Used  . Alcohol Use: Yes     occasional   Family History  Problem Relation Age of Onset  . Diabetes Mother   . Coronary artery disease Mother   . Hypothyroidism Mother   . Alzheimer's disease Father   . Nephrolithiasis Father   . Hypothyroidism Brother    No Known Allergies Current  Outpatient Prescriptions on File Prior to Visit  Medication Sig Dispense Refill  . ADVAIR DISKUS 100-50 MCG/DOSE AEPB USE 1 PUFF TWICE DAILY DURING ALLERGY SEASON  60 each  10  . albuterol (PROVENTIL,VENTOLIN) 90 MCG/ACT inhaler Inhale 2 puffs into the lungs every 4 (four) hours as needed.        Marland Kitchen aspirin 81 MG tablet Take 81 mg by mouth daily.        Marland Kitchen atorvastatin (LIPITOR) 20 MG tablet Take 0.5 tablets (10 mg total) by mouth daily.  15 tablet  11  . calcium carbonate (CALCIUM 600) 600 MG TABS Take 600 mg by mouth 2 (two) times daily with a meal.        . cyclobenzaprine (FLEXERIL) 10 MG tablet Take 1 tablet (10 mg total) by mouth 3 (three) times daily as needed.  30 tablet  3  . fosinopril-hydrochlorothiazide (MONOPRIL-HCT) 10-12.5 MG per tablet Take 1 tablet by mouth daily.  30 tablet  11  . glipiZIDE (GLUCOTROL XL) 5 MG 24 hr tablet Take 1 tablet (5 mg total) by mouth daily.  30 tablet  11  . HYDROcodone-acetaminophen (VICODIN) 5-500 MG per tablet Take 1 tablet by mouth every 6 (six)  hours as needed for pain. 1/2 to 1 tab by mouth three times a day as needed for pain  30 tablet  0  . hyoscyamine (LEVSIN SL) 0.125 MG SL tablet Place 0.125 mg under the tongue. Place one tablet under tongue once a day as needed.       . loratadine (CLARITIN) 10 MG tablet Take 10 mg by mouth daily.        . meloxicam (MOBIC) 15 MG tablet Take 1 tablet (15 mg total) by mouth daily as needed (with food). with food  30 tablet  5  . metFORMIN (GLUCOPHAGE) 1000 MG tablet Take 1 tablet (1,000 mg total) by mouth 2 (two) times daily with a meal.  60 tablet  11  . multivitamin (THERAGRAN) per tablet Take 1 tablet by mouth daily.        . ONE TOUCH ULTRA TEST test strip CHECK SUGAR TWICE A DAY AS INSTRUCTED DM 250.0 THAT IS NOT OPTIMALLY CONTROLLED  100 each  3  . PROAIR HFA 108 (90 BASE) MCG/ACT inhaler USE 2 PUFFS UPTO EVERY 4 HOURS AS NEEDED FOR WHEEZING  8.5 g  8  . SYNTHROID 25 MCG tablet TAKE 1 TABLET (25 MCG TOTAL)  BY MOUTH DAILY.  30 tablet  11      Review of Systems Review of Systems  Constitutional: Negative for fever, appetite change, fatigue and unexpected weight change.  Eyes: Negative for pain and visual disturbance.  Respiratory: Negative for cough and shortness of breath.   Cardiovascular: Negative for cp or palpitations    Gastrointestinal: Negative for nausea, diarrhea and constipation.  Genitourinary: Negative for urgency and frequency.  Skin: Negative for pallor and pos for rash/ itching and soreness  Neurological: Negative for weakness, light-headedness, numbness and headaches.  Hematological: Negative for adenopathy. Does not bruise/bleed easily.  Psychiatric/Behavioral: Negative for dysphoric mood. The patient is not nervous/anxious.         Objective:   Physical Exam  Constitutional: She appears well-developed and well-nourished. No distress.       obese and well appearing   HENT:  Head: Normocephalic and atraumatic.  Eyes: Conjunctivae normal and EOM are normal. Pupils are equal, round, and reactive to light.       No facial or mouth swelling   Neck: Normal range of motion. Neck supple. No tracheal deviation present.  Cardiovascular: Normal rate and regular rhythm.   Pulmonary/Chest: Effort normal and breath sounds normal. No respiratory distress. She has no wheezes.  Lymphadenopathy:    She has no cervical adenopathy.  Skin: Skin is warm and dry. Rash noted. There is erythema. No pallor.       Medial R leg is erythematous in patches with mild swelling - very slt warm to the touch slt tender Area extends to the foot  No satellite lesions Area of insect bite on med foot-induration without any residual stinger or insect parts present  Psychiatric: She has a normal mood and affect.          Assessment & Plan:

## 2012-05-14 NOTE — Patient Instructions (Addendum)
Continue the antibiotic and prednisone -and finish it Sugars will be high  If leg looks worse or if you run fever or feel bad, let me know  For rash under breasts - try lotrimin over the counter

## 2012-05-31 ENCOUNTER — Ambulatory Visit (INDEPENDENT_AMBULATORY_CARE_PROVIDER_SITE_OTHER): Payer: BC Managed Care – PPO

## 2012-05-31 DIAGNOSIS — Z23 Encounter for immunization: Secondary | ICD-10-CM

## 2012-06-04 ENCOUNTER — Ambulatory Visit: Payer: Self-pay | Admitting: Family Medicine

## 2012-06-04 ENCOUNTER — Encounter: Payer: Self-pay | Admitting: Family Medicine

## 2012-06-05 ENCOUNTER — Encounter: Payer: Self-pay | Admitting: *Deleted

## 2012-07-07 ENCOUNTER — Other Ambulatory Visit: Payer: Self-pay | Admitting: Family Medicine

## 2012-07-09 NOTE — Telephone Encounter (Signed)
She can have a year of refils on that, thanks

## 2012-07-09 NOTE — Telephone Encounter (Signed)
Ok to refill 

## 2012-07-24 ENCOUNTER — Other Ambulatory Visit: Payer: Self-pay | Admitting: Family Medicine

## 2012-07-25 NOTE — Telephone Encounter (Signed)
Can refil 3 times, thanks

## 2012-10-07 ENCOUNTER — Telehealth: Payer: Self-pay | Admitting: Family Medicine

## 2012-10-07 DIAGNOSIS — Z0000 Encounter for general adult medical examination without abnormal findings: Secondary | ICD-10-CM

## 2012-10-07 DIAGNOSIS — E119 Type 2 diabetes mellitus without complications: Secondary | ICD-10-CM

## 2012-10-07 DIAGNOSIS — E785 Hyperlipidemia, unspecified: Secondary | ICD-10-CM

## 2012-10-07 NOTE — Telephone Encounter (Signed)
Message copied by Judy Pimple on Sun Oct 07, 2012 12:24 PM ------      Message from: Baldomero Lamy      Created: Fri Sep 28, 2012  1:19 PM      Regarding: Cpx labs Mon 10/08/12       Please order  future cpx labs for pt's upcoming lab appt.      Thanks      Rodney Booze

## 2012-10-08 ENCOUNTER — Other Ambulatory Visit (INDEPENDENT_AMBULATORY_CARE_PROVIDER_SITE_OTHER): Payer: BC Managed Care – PPO

## 2012-10-08 DIAGNOSIS — E785 Hyperlipidemia, unspecified: Secondary | ICD-10-CM

## 2012-10-08 DIAGNOSIS — E119 Type 2 diabetes mellitus without complications: Secondary | ICD-10-CM

## 2012-10-08 DIAGNOSIS — Z0000 Encounter for general adult medical examination without abnormal findings: Secondary | ICD-10-CM

## 2012-10-08 LAB — CBC WITH DIFFERENTIAL/PLATELET
Basophils Absolute: 0 10*3/uL (ref 0.0–0.1)
Eosinophils Absolute: 0.1 10*3/uL (ref 0.0–0.7)
HCT: 35.1 % — ABNORMAL LOW (ref 36.0–46.0)
Hemoglobin: 11.5 g/dL — ABNORMAL LOW (ref 12.0–15.0)
Lymphs Abs: 1.7 10*3/uL (ref 0.7–4.0)
MCHC: 32.8 g/dL (ref 30.0–36.0)
Monocytes Relative: 7.9 % (ref 3.0–12.0)
Neutro Abs: 4.1 10*3/uL (ref 1.4–7.7)
Platelets: 261 10*3/uL (ref 150.0–400.0)
RDW: 13.4 % (ref 11.5–14.6)

## 2012-10-08 LAB — COMPREHENSIVE METABOLIC PANEL
ALT: 21 U/L (ref 0–35)
AST: 23 U/L (ref 0–37)
Alkaline Phosphatase: 53 U/L (ref 39–117)
Creatinine, Ser: 0.7 mg/dL (ref 0.4–1.2)
Sodium: 141 mEq/L (ref 135–145)
Total Bilirubin: 0.5 mg/dL (ref 0.3–1.2)
Total Protein: 6.9 g/dL (ref 6.0–8.3)

## 2012-10-08 LAB — LIPID PANEL
Cholesterol: 156 mg/dL (ref 0–200)
HDL: 42.3 mg/dL (ref >39.00)
VLDL: 32.2 mg/dL (ref 0.0–40.0)

## 2012-10-17 ENCOUNTER — Ambulatory Visit: Payer: BC Managed Care – PPO | Admitting: Family Medicine

## 2012-10-23 ENCOUNTER — Ambulatory Visit: Payer: BC Managed Care – PPO | Admitting: Family Medicine

## 2012-10-24 ENCOUNTER — Encounter: Payer: Self-pay | Admitting: Family Medicine

## 2012-10-24 ENCOUNTER — Ambulatory Visit (INDEPENDENT_AMBULATORY_CARE_PROVIDER_SITE_OTHER): Payer: BC Managed Care – PPO | Admitting: Family Medicine

## 2012-10-24 VITALS — BP 140/76 | HR 82 | Temp 98.5°F | Ht 65.0 in | Wt 222.5 lb

## 2012-10-24 DIAGNOSIS — Z1211 Encounter for screening for malignant neoplasm of colon: Secondary | ICD-10-CM | POA: Insufficient documentation

## 2012-10-24 DIAGNOSIS — D649 Anemia, unspecified: Secondary | ICD-10-CM

## 2012-10-24 DIAGNOSIS — I10 Essential (primary) hypertension: Secondary | ICD-10-CM

## 2012-10-24 DIAGNOSIS — E039 Hypothyroidism, unspecified: Secondary | ICD-10-CM

## 2012-10-24 DIAGNOSIS — E119 Type 2 diabetes mellitus without complications: Secondary | ICD-10-CM

## 2012-10-24 DIAGNOSIS — R1011 Right upper quadrant pain: Secondary | ICD-10-CM

## 2012-10-24 MED ORDER — GLIPIZIDE ER 10 MG PO TB24: 10.0000 mg | ORAL_TABLET | Freq: Every day | ORAL | Status: DC

## 2012-10-24 MED ORDER — LEVOTHYROXINE SODIUM 50 MCG PO TABS: 50.0000 ug | ORAL_TABLET | Freq: Every day | ORAL | Status: DC

## 2012-10-24 NOTE — Patient Instructions (Addendum)
Schedule non fasting labs 6 weeks for thyroid  Follow up in 3 months  Increase glipizide to 10 mg once daily  Increase thyroid dose to 50 mcg once daily  See Shirlee Limerick at check out about ultrasound Please do the stool test

## 2012-10-24 NOTE — Progress Notes (Signed)
Subjective:    Patient ID: Brooke Mitchell, female    DOB: [birth year], 62 y.o.   MRN: [id number]  HPI Here for f/u of chronic health issues  Diabetes Home sugar results - are up  DM diet - having a hard time at home/ stressors and eating too many sweets - comforts herself and she has it in the house and is trying to get husband to gain wt  Exercise - some  Symptoms A1C last  Lab Results  Component Value Date   HGBA1C 7.0* 10/08/2012   This is up from 6.3 No problems with medications  Renal protection Last eye exam - up to date -had that in [facility] is up 8 lb with bmi of 37- not surprised   Hypothyroid Lab Results  Component Value Date   TSH 8.15* 10/08/2012  no missed doses at all   bp is stable today  No cp or palpitations or headaches or edema  No side effects to medicines  BP Readings from Last 3 Encounters:  10/24/12 140/76  05/14/12 112/70  04/13/12 118/64       Lab Results  Component Value Date   WBC 6.5 10/08/2012   HGB 11.5* 10/08/2012   HCT 35.1* 10/08/2012   MCV 77.2* 10/08/2012   PLT 261.0 10/08/2012    ? If anemic in the past  Up to date on colon cancer screening -within 10 years  No abd pain/ blood in stool or vaginal bleeding   Is also having shoulder problems R Also pain under R breast on and off - no corresp with eating  Is tender under her R ribs She still has a gallbladder   Patient Active Problem List  Diagnosis  . HYPOTHYROIDISM  . DIABETES MELLITUS, TYPE II  . HYPERLIPIDEMIA  . RESTLESS LEG SYNDROME  . HYPERTENSION  . PREMATURE VENTRICULAR CONTRACTIONS  . HEMORRHOIDS  . ALLERGIC RHINITIS  . ASTHMA  . GERD  . HIATAL HERNIA  . OVERACTIVE BLADDER  . UTI'S, RECURRENT  . POSTMENOPAUSAL STATUS  . DISC DISEASE, CERVICAL  . SLEEP APNEA, SEVERE  . Routine general medical examination at a health care facility  . Other screening mammogram  . Obesity  . Trochanteric bursitis of left hip   Past Medical History  Diagnosis Date  . Allergic  rhinitis   . Asthma     As a child   . GERD (gastroesophageal reflux disease)   . Hyperlipidemia   . Hypertension   . Hypothyroidism   . Diabetes mellitus     Type II (2/06 elevated microalbumin)  . Obesity   . OSA (obstructive sleep apnea)   . Frequent UTI     with coital prohylaxis    Past Surgical History  Procedure Laterality Date  . Foot surgery    . Endometrial biopsy    . Dilation and curettage of uterus     History  Substance Use Topics  . Smoking status: Never Smoker   . Smokeless tobacco: Never Used  . Alcohol Use: Yes     Comment: occasional   Family History  Problem Relation Age of Onset  . Diabetes Mother   . Coronary artery disease Mother   . Hypothyroidism Mother   . Alzheimer's disease Father   . Nephrolithiasis Father   . Hypothyroidism Brother    No Known Allergies Current Outpatient Prescriptions on File Prior to Visit  Medication Sig Dispense Refill  . ADVAIR DISKUS 100-50 MCG/DOSE AEPB USE 1 PUFF TWICE DAILY DURING  ALLERGY SEASON  60 each  10  . albuterol (PROVENTIL,VENTOLIN) 90 MCG/ACT inhaler Inhale 2 puffs into the lungs every 4 (four) hours as needed.        Marland Kitchen aspirin 81 MG tablet Take 81 mg by mouth daily.        Marland Kitchen atorvastatin (LIPITOR) 20 MG tablet Take 0.5 tablets (10 mg total) by mouth daily.  15 tablet  11  . calcium carbonate (CALCIUM 600) 600 MG TABS Take 600 mg by mouth 2 (two) times daily with a meal.        . cyclobenzaprine (FLEXERIL) 10 MG tablet Take 1 tablet (10 mg total) by mouth 3 (three) times daily as needed.  30 tablet  3  . fosinopril-hydrochlorothiazide (MONOPRIL-HCT) 10-12.5 MG per tablet Take 1 tablet by mouth daily.  30 tablet  11  . glipiZIDE (GLUCOTROL XL) 5 MG 24 hr tablet Take 1 tablet (5 mg total) by mouth daily.  30 tablet  11  . HYDROcodone-acetaminophen (VICODIN) 5-500 MG per tablet Take 1 tablet by mouth every 6 (six) hours as needed for pain. 1/2 to 1 tab by mouth three times a day as needed for pain  30 tablet   0  . hyoscyamine (LEVSIN SL) 0.125 MG SL tablet Place 0.125 mg under the tongue. Place one tablet under tongue once a day as needed.       . loratadine (CLARITIN) 10 MG tablet Take 10 mg by mouth daily.        . meloxicam (MOBIC) 15 MG tablet TAKE 1 TABLET BY MOUTH DAILY AS NEEDED WITH FOOD  90 tablet  3  . metFORMIN (GLUCOPHAGE) 1000 MG tablet Take 1 tablet (1,000 mg total) by mouth 2 (two) times daily with a meal.  60 tablet  11  . multivitamin (THERAGRAN) per tablet Take 1 tablet by mouth daily.        . ONE TOUCH ULTRA TEST test strip CHECK SUGAR TWICE A DAY AS INSTRUCTED DM 250.0 THAT IS NOT OPTIMALLY CONTROLLED  100 each  3  . predniSONE (STERAPRED UNI-PAK) 10 MG tablet Take 10 mg by mouth daily.      Marland Kitchen PROAIR HFA 108 (90 BASE) MCG/ACT inhaler USE 2 PUFFS UPTO EVERY 4 HOURS AS NEEDED FOR WHEEZING  8.5 g  8  . SYNTHROID 25 MCG tablet TAKE 1 TABLET (25 MCG TOTAL) BY MOUTH DAILY.  30 tablet  11  . traMADol (ULTRAM) 50 MG tablet TAKE 1 TABLET (50 MG TOTAL) BY MOUTH EVERY 6 (SIX) HOURS AS NEEDED FOR PAIN.  50 tablet  2   No current facility-administered medications on file prior to visit.    Review of Systems Review of Systems  Constitutional: Negative for fever, appetite change,  and unexpected weight change. pos for fatigue Eyes: Negative for pain and visual disturbance.  Respiratory: Negative for cough and shortness of breath.   Cardiovascular: Negative for cp or palpitations   neg for pedal edema  Gastrointestinal: Negative for nausea, diarrhea and constipation. pos for abdominal pain in ruq that rad to her shoulder Neg for blood in stool or black stool Genitourinary: Negative for urgency and frequency. neg for excessive thirst  Skin: Negative for pallor or rash   Neurological: Negative for weakness, light-headedness, numbness and headaches.  Hematological: Negative for adenopathy. Does not bruise/bleed easily.  Psychiatric/Behavioral: Negative for dysphoric mood. The patient is not  nervous/anxious.  pos for stressors        Objective:   Physical Exam  Constitutional:  She is oriented to person, place, and time. She appears well-developed and well-nourished. No distress.  obese and well appearing   HENT:  Head: Normocephalic and atraumatic.  Right Ear: External ear normal.  Left Ear: External ear normal.  Nose: Nose normal.  Mouth/Throat: Oropharynx is clear and moist.  Eyes: Conjunctivae and EOM are normal. Pupils are equal, round, and reactive to light. Right eye exhibits no discharge. Left eye exhibits no discharge. No scleral icterus.  Neck: Normal range of motion. Neck supple. No JVD present. Carotid bruit is not present. No thyromegaly present.  Cardiovascular: Normal rate, regular rhythm, normal heart sounds and intact distal pulses.  Exam reveals no gallop.   Pulmonary/Chest: Effort normal and breath sounds normal. No respiratory distress. She has no wheezes. She exhibits no tenderness.  Abdominal: Soft. Bowel sounds are normal. She exhibits no distension, no fluid wave, no abdominal bruit, no ascites and no mass. There is no hepatosplenomegaly. There is tenderness in the right upper quadrant. There is positive Murphy's sign. There is no rebound, no guarding and no CVA tenderness.  Musculoskeletal: Normal range of motion. She exhibits no edema and no tenderness.  Lymphadenopathy:    She has no cervical adenopathy.  Neurological: She is alert and oriented to person, place, and time. She has normal reflexes. She displays no tremor. No cranial nerve deficit. She exhibits normal muscle tone. Coordination normal.  Skin: Skin is warm and dry. No rash noted. No erythema. No pallor.  Psychiatric: She has a normal mood and affect.          Assessment & Plan:

## 2012-10-25 NOTE — Assessment & Plan Note (Signed)
Pt has new mild anemia  Given ifob card No GI symptoms  Will continue to follow

## 2012-10-25 NOTE — Assessment & Plan Note (Signed)
This radiates to the shoulder  Gallstones are in the differential  Check abd ultrasound and update

## 2012-10-25 NOTE — Assessment & Plan Note (Signed)
bp in fair control at this time  No changes needed  Disc lifstyle change with low sodium diet and exercise   

## 2012-10-25 NOTE — Assessment & Plan Note (Signed)
tsh high,  Lab Results  Component Value Date   TSH 8.15* 10/08/2012   Increase thyroid supplement and re check 6 weeks  Pt has had wt gain and fatigue

## 2012-10-25 NOTE — Assessment & Plan Note (Signed)
Mild and asymptomatic  Check ifob Consider re checkand iron studies

## 2012-10-29 ENCOUNTER — Ambulatory Visit: Payer: Self-pay | Admitting: Family Medicine

## 2012-10-29 ENCOUNTER — Encounter: Payer: Self-pay | Admitting: Family Medicine

## 2012-10-31 ENCOUNTER — Other Ambulatory Visit (INDEPENDENT_AMBULATORY_CARE_PROVIDER_SITE_OTHER): Payer: BC Managed Care – PPO

## 2012-10-31 ENCOUNTER — Telehealth: Payer: Self-pay | Admitting: Family Medicine

## 2012-10-31 DIAGNOSIS — R1011 Right upper quadrant pain: Secondary | ICD-10-CM

## 2012-10-31 DIAGNOSIS — Z1211 Encounter for screening for malignant neoplasm of colon: Secondary | ICD-10-CM

## 2012-10-31 LAB — FECAL OCCULT BLOOD, IMMUNOCHEMICAL: Fecal Occult Bld: NEGATIVE

## 2012-10-31 NOTE — Telephone Encounter (Signed)
I am going ahead and referring her to GI for further eval of right upper quad abd pain  Have her get prilosec 20 mg otc and try one daily -on the off chance that gastritis / acid could play a role  Let her know Shirlee Limerick will be calling

## 2012-10-31 NOTE — Telephone Encounter (Signed)
Pt notified of Dr. Royden Purl comments and that Shirlee Limerick will call to set up GI appt

## 2012-10-31 NOTE — Telephone Encounter (Signed)
Left voicemail on home an cell phone requesting pt to call office

## 2012-10-31 NOTE — Telephone Encounter (Signed)
Message copied by Judy Pimple on Wed Oct 31, 2012  1:53 PM ------      Message from: Shon Millet      Created: Wed Oct 31, 2012 11:42 AM       Pt notified of results and Dr. Royden Purl comments, pt said that her pain is a lot worse now ------

## 2012-11-01 ENCOUNTER — Encounter: Payer: Self-pay | Admitting: Gastroenterology

## 2012-11-14 ENCOUNTER — Ambulatory Visit (INDEPENDENT_AMBULATORY_CARE_PROVIDER_SITE_OTHER): Payer: BC Managed Care – PPO | Admitting: Gastroenterology

## 2012-11-14 ENCOUNTER — Encounter: Payer: Self-pay | Admitting: Gastroenterology

## 2012-11-14 VITALS — BP 122/84 | HR 88 | Ht 65.0 in | Wt 223.8 lb

## 2012-11-14 DIAGNOSIS — R079 Chest pain, unspecified: Secondary | ICD-10-CM

## 2012-11-14 DIAGNOSIS — Z1211 Encounter for screening for malignant neoplasm of colon: Secondary | ICD-10-CM

## 2012-11-14 DIAGNOSIS — D649 Anemia, unspecified: Secondary | ICD-10-CM

## 2012-11-14 NOTE — Progress Notes (Signed)
History of Present Illness: This is a 62 year old female who relates right lateral chest pain at the site of her bra strap and also has right shoulder pain. Both symptoms worsen with movement. Patient knows her symptoms have improved but not resolved over the past week or 2 since beginning Prilosec. She was also found to have a microcytic anemia with a hemoglobin of 11.5 and MCV of 77. Stool Hemoccults were negative. Iron studies are ordered for next week. Recent abdominal ultrasound showed hepatic steatosis but no other abnormalities. She has no difficulty tolerating meals and her pain does not change with meals. She previously underwent colonoscopy by Dr. Leone Payor in November 2004 showing only external hemorrhoids. She does take Mobic intermittently. Her husband has ALS and she states she does quite a bit of care and movement. Her appointment was mistakenly made with me since she is has established with Dr. Leone Payor however I see her husband as a patient and she is considering her choice of gastroenterologists for further followup. Denies weight loss, abdominal pain, constipation, diarrhea, change in stool caliber, melena, hematochezia, nausea, vomiting, dysphagia, reflux symptoms.  Review of Systems: Pertinent positive and negative review of systems were noted in the above HPI section. All other review of systems were otherwise negative.  Current Medications, Allergies, Past Medical History, Past Surgical History, Family History and Social History were reviewed in [employer] record.  Physical Exam: General: Well developed , well nourished, no acute distress Head: Normocephalic and atraumatic Eyes:  sclerae anicteric, EOMI Ears: Normal auditory acuity Mouth: No deformity or lesions Neck: Supple, no masses or thyromegaly Lungs: Clear throughout to auscultation, focal right midaxillary line chest tenderness at the site of her bra strap Heart: Regular rate and rhythm; no murmurs,  rubs or bruits Abdomen: Soft, non tender and non distended. No masses, hepatosplenomegaly or hernias noted. Normal Bowel sounds Rectal: Stool Hemoccults recently negative Musculoskeletal: Symmetrical with no gross deformities  Skin: No lesions on visible extremities Pulses:  Normal pulses noted Extremities: No clubbing, cyanosis, edema or deformities noted Neurological: Alert oriented x 4, grossly nonfocal Cervical Nodes:  No significant cervical adenopathy Inguinal Nodes: No significant inguinal adenopathy Psychological:  Alert and cooperative. Normal mood and affect  Assessment and Recommendations:  1. Right chest wall pain and right shoulder pain. These symptoms do not appear to be gastrointestinal in origin. I think the improvement with Prilosec is probably coincidental however if no other cause is found for her right chest pain it is not unreasonable to consider upper endoscopy to further evaluate.. Further followup with Dr. Milinda Antis.  2. Microcytic anemia with Hemoccult negative stool. Awaiting iron studies. If she is iron deficient I would recommend proceeding with colonoscopy +/- upper endoscopy. She is due for screening colonoscopy in November 2014.

## 2012-11-14 NOTE — Patient Instructions (Addendum)
We will call after your lab results get back to possibly schedule your Colonoscopy with Dr. Russella Dar or Dr. Leone Payor.   Thank you for choosing me and [facility].  Venita Lick. Pleas Koch., MD., Clementeen Graham

## 2012-11-15 ENCOUNTER — Encounter: Payer: Self-pay | Admitting: Internal Medicine

## 2012-11-19 ENCOUNTER — Other Ambulatory Visit: Payer: BC Managed Care – PPO

## 2012-11-21 ENCOUNTER — Other Ambulatory Visit (INDEPENDENT_AMBULATORY_CARE_PROVIDER_SITE_OTHER): Payer: BC Managed Care – PPO

## 2012-11-21 DIAGNOSIS — D649 Anemia, unspecified: Secondary | ICD-10-CM

## 2012-11-21 LAB — CBC WITH DIFFERENTIAL/PLATELET
Basophils Relative: 0.2 % (ref 0.0–3.0)
Eosinophils Absolute: 0.1 10*3/uL (ref 0.0–0.7)
Hemoglobin: 11 g/dL — ABNORMAL LOW (ref 12.0–15.0)
Lymphocytes Relative: 26.6 % (ref 12.0–46.0)
MCHC: 32.3 g/dL (ref 30.0–36.0)
MCV: 77.5 fl — ABNORMAL LOW (ref 78.0–100.0)
Neutro Abs: 3.4 10*3/uL (ref 1.4–7.7)
RBC: 4.38 Mil/uL (ref 3.87–5.11)

## 2012-11-26 ENCOUNTER — Telehealth: Payer: Self-pay | Admitting: *Deleted

## 2012-11-26 DIAGNOSIS — D509 Iron deficiency anemia, unspecified: Secondary | ICD-10-CM

## 2012-11-26 NOTE — Telephone Encounter (Signed)
I have spoken to patient and have given her Dr Ardell Isaacs recommendations regarding her evaluation for anemia. She would like Dr Russella Dar to complete her endoscopy/colonoscopy since she does not recollect seeing Dr Leone Payor. Patient has been scheduled in [facility] for endo/colon on 12/26/12 @ pm. She has also been scheduled for a previsit on 12/12/12 @ 2:30 pm. Patient questions whether her procedure would be covered if done in [facility] as there was some discussion about this a few years ago with her insurance. I have advised our precert coordinator, Ruffin Pyo about this and she states that she will look into it. I have also advised patient that she will need bloodwork (celiac panel) prior to her endoscopy/colonoscopy. She knows to go to the basement level of [facility] for the labwork prior to her procedures.

## 2012-11-26 NOTE — Telephone Encounter (Signed)
Message copied by Richardson Chiquito on Mon Nov 26, 2012  8:20 AM ------      Message from: Claudette Head T      Created: Sat Nov 24, 2012  3:44 PM       She needs a celiac panel and colon/egd for fe def anemia. She has seen CG before and can schedule colon/egd with me or CG. Thanks. MS                  ----- Message -----         From: Richardson Chiquito, CMA         Sent: 11/23/2012  10:43 AM           To: Meryl Dare, MD, Richardson Chiquito, CMA            Dr Russella Dar-      Patient's iron studies have come back. It does appear that she is iron deficient. I understand that you want to perform colonoscopy on patient due to iron deficiency. However, in your last office note, you indicated that you would do "colonoscopy +/- endoscopy." Do I need to schedule endo/colon or just colon?      ----- Message -----         From: Jessee Avers, CMA         Sent: 11/19/2012           To: Jessee Avers, CMA            Check to see if patient's lab results from Dr. Royden Purl office is back. If patient is iron def they will need Colonoscopy scheduled either with Dr. Leone Payor or Dr. Russella Dar. Pt is to choose.              ------

## 2012-12-05 ENCOUNTER — Other Ambulatory Visit: Payer: BC Managed Care – PPO

## 2012-12-12 ENCOUNTER — Encounter: Payer: Self-pay | Admitting: Gastroenterology

## 2012-12-12 ENCOUNTER — Ambulatory Visit (AMBULATORY_SURGERY_CENTER): Payer: BC Managed Care – PPO | Admitting: *Deleted

## 2012-12-12 ENCOUNTER — Other Ambulatory Visit: Payer: BC Managed Care – PPO

## 2012-12-12 VITALS — Ht 65.0 in | Wt 224.6 lb

## 2012-12-12 DIAGNOSIS — D509 Iron deficiency anemia, unspecified: Secondary | ICD-10-CM

## 2012-12-12 DIAGNOSIS — Z1211 Encounter for screening for malignant neoplasm of colon: Secondary | ICD-10-CM

## 2012-12-12 MED ORDER — MOVIPREP 100 G PO SOLR: ORAL | Status: DC

## 2012-12-13 LAB — CELIAC PANEL 10
Endomysial Screen: NEGATIVE
Gliadin IgA: 3.1 U/mL (ref <20)
Gliadin IgG: 6.5 U/mL (ref <20)
IgA: 117 mg/dL (ref 69–380)
Tissue Transglutaminase Ab, IgA: 3 U/mL (ref <20)

## 2012-12-18 ENCOUNTER — Encounter: Payer: Self-pay | Admitting: Family Medicine

## 2012-12-24 ENCOUNTER — Telehealth: Payer: Self-pay | Admitting: Gastroenterology

## 2012-12-24 NOTE — Telephone Encounter (Signed)
Pt ate corn and peas and pecan pie Sunday.  Advised to be sure she drinks plenty of liquids the next two days and keep bowels moving.

## 2012-12-25 ENCOUNTER — Telehealth: Payer: Self-pay | Admitting: Gastroenterology

## 2012-12-25 NOTE — Telephone Encounter (Signed)
OK to no charge this one time.

## 2012-12-26 ENCOUNTER — Encounter: Payer: BC Managed Care – PPO | Admitting: Gastroenterology

## 2013-01-18 ENCOUNTER — Telehealth: Payer: Self-pay | Admitting: Gastroenterology

## 2013-01-18 DIAGNOSIS — Z1211 Encounter for screening for malignant neoplasm of colon: Secondary | ICD-10-CM

## 2013-01-18 DIAGNOSIS — D509 Iron deficiency anemia, unspecified: Secondary | ICD-10-CM

## 2013-01-18 MED ORDER — MOVIPREP 100 G PO SOLR: ORAL | Status: DC

## 2013-01-18 NOTE — Telephone Encounter (Signed)
Prescription resent to patient's pharmacy. 

## 2013-01-22 ENCOUNTER — Ambulatory Visit: Payer: BC Managed Care – PPO | Admitting: Family Medicine

## 2013-01-22 ENCOUNTER — Other Ambulatory Visit: Payer: Self-pay | Admitting: Gastroenterology

## 2013-01-22 ENCOUNTER — Ambulatory Visit (AMBULATORY_SURGERY_CENTER): Payer: BC Managed Care – PPO | Admitting: Gastroenterology

## 2013-01-22 ENCOUNTER — Encounter: Payer: Self-pay | Admitting: Gastroenterology

## 2013-01-22 VITALS — BP 162/81 | HR 75 | Temp 98.2°F | Resp 16 | Ht 65.0 in | Wt 224.0 lb

## 2013-01-22 DIAGNOSIS — D509 Iron deficiency anemia, unspecified: Secondary | ICD-10-CM

## 2013-01-22 DIAGNOSIS — K31819 Angiodysplasia of stomach and duodenum without bleeding: Secondary | ICD-10-CM

## 2013-01-22 DIAGNOSIS — Z1211 Encounter for screening for malignant neoplasm of colon: Secondary | ICD-10-CM

## 2013-01-22 DIAGNOSIS — K259 Gastric ulcer, unspecified as acute or chronic, without hemorrhage or perforation: Secondary | ICD-10-CM

## 2013-01-22 DIAGNOSIS — K317 Polyp of stomach and duodenum: Secondary | ICD-10-CM

## 2013-01-22 DIAGNOSIS — D131 Benign neoplasm of stomach: Secondary | ICD-10-CM

## 2013-01-22 MED ORDER — SODIUM CHLORIDE 0.9 % IV SOLN: 500.0000 mL | INTRAVENOUS | Status: DC

## 2013-01-22 NOTE — Patient Instructions (Addendum)

## 2013-01-22 NOTE — Op Note (Addendum)
[facility] [address] [place], [address]   COLONOSCOPY PROCEDURE REPORT  PATIENT: Brooke Mitchell, Brooke Mitchell  MR#: [id number] BIRTHDATE: 1950/12/20 , 61  yrs. old GENDER: Female ENDOSCOPIST: Meryl Dare, MD, [facility] REFERRED WU:[id number] Fransisca Connors, M.D. PROCEDURE DATE:  01/22/2013 PROCEDURE:   Colonoscopy, diagnostic ASA CLASS:   Class II INDICATIONS:Iron Deficiency Anemia. MEDICATIONS: MAC sedation, administered by CRNA and propofol (Diprivan) 250mg  IV DESCRIPTION OF PROCEDURE:   After the risks benefits and alternatives of the procedure were thoroughly explained, informed consent was obtained.  A digital rectal exam revealed no abnormalities of the rectum.   The [id number]  endoscope was introduced through the anus and advanced to the cecum, which was identified by both the appendix and ileocecal valve. No adverse events experienced with a tortuous colon.   The quality of the prep was good, using MoviPrep  The instrument was then slowly withdrawn as the colon was fully examined.   COLON FINDINGS: A normal appearing cecum, ileocecal valve, and appendiceal orifice were identified.  The ascending, hepatic flexure, transverse, splenic flexure, descending, sigmoid colon and rectum appeared unremarkable.  No polyps or cancers were seen. Retroflexed views revealed moderate internal hemorrhoids. The time to cecum=3 minutes 40 seconds.  Withdrawal time=12 minutes 18 seconds.  The scope was withdrawn and the procedure completed. COMPLICATIONS: There were no complications.  ENDOSCOPIC IMPRESSION: 1.  Normal colon 2.  Moderate internal hemorrhoids  RECOMMENDATIONS: 1.  Continue current colorectal screening recommendations for "routine risk" patients with a repeat colonoscopy in 10 years. 2.  EGD today  eSigned:  Meryl Dare, MD, [facility] 01/22/2013 2:15 PM Revised: 01/22/2013 2:15 PM  [C

## 2013-01-22 NOTE — Progress Notes (Signed)
Called to room to assist during endoscopic procedure.  Patient ID and intended procedure confirmed with present staff. Received instructions for my participation in the procedure from the performing physician.  

## 2013-01-22 NOTE — Op Note (Signed)
[facility] [address] [place], [address]   ENDOSCOPY PROCEDURE REPORT  PATIENT: Brooke Mitchell, Brooke Mitchell  MR#: [id number] BIRTHDATE: 12/23/1950 , 61  yrs. old GENDER: Female ENDOSCOPIST: Meryl Dare, MD, Clementeen Graham REFERRED BY:  Judy Pimple, M.D. PROCEDURE DATE:  01/22/2013 PROCEDURE:  EGD w/ snare technique and EGD w/ biopsy ASA CLASS:     Class II INDICATIONS:  Iron deficiency anemia. MEDICATIONS: residual sedation present from prior procedure, MAC sedation, administered by CRNA, propofol (Diprivan) 250mg  IV TOPICAL ANESTHETIC: none DESCRIPTION OF PROCEDURE: After the risks benefits and alternatives of the procedure were thoroughly explained, informed consent was obtained.  The LB [id number] endoscope was introduced through the mouth and advanced to the second portion of the duodenum without limitations.  The instrument was slowly withdrawn as the mucosa was fully examined.  STOMACH: Mild gastritis was found in the gastric antrum.  Multiple biopsies were performed using cold forceps. R/O GAVE. A semi-pedunculated polyp measuring 4 mm in size was found in the gastric body. It was biopsied with cold forceps.   A pedunculated polyp measuring 2.5 cm in size was found in the gastric fundus.  A polypectomy was performed with snare cautery.  The resection was complete and the polyp tissue was completely retrieved. The gastric mucosa appeared mildly atrophic. The stomach otherwise appeared normal. DUODENUM: The duodenal mucosa showed no abnormalities in the bulb and second portion of the duodenum. ESOPHAGUS: The mucosa of the esophagus appeared normal.  Retroflexed views revealed no abnormalities.     The scope was then withdrawn from the patient and the procedure completed.  COMPLICATIONS: There were no complications.  ENDOSCOPIC IMPRESSION: 1.   Gastritis in the gastric antrum; multiple biopsies 2.   Semi-pedunculated polyp measuring 4 mm in the gastric  body; biopsied 3.   Pedunculated polyp measuring 2.5 cm in the gastric fundus; snare cautery polypectomy  RECOMMENDATIONS: 1.  Avoid ASA/NSAIDS for two weeks 2.  Await pathology results 3.  Continue PPI e] eSigned:  Meryl Dare, MD, [facility] 01/22/2013 2:46 PM re

## 2013-01-22 NOTE — Progress Notes (Signed)
Patient did not have preoperative order for IV antibiotic SSI prophylaxis. (G8918)  Patient did not experience any of the following events: a burn prior to discharge; a fall within the facility; wrong site/side/patient/procedure/implant event; or a hospital transfer or hospital admission upon discharge from the facility. (G8907)  

## 2013-01-23 ENCOUNTER — Telehealth: Payer: Self-pay | Admitting: *Deleted

## 2013-01-23 NOTE — Telephone Encounter (Signed)
  Follow up Call-  Call back number 01/22/2013  Post procedure Call Back phone  # [phone]  Permission to leave phone message Yes     Patient questions:  Do you have a fever, pain , or abdominal swelling? no Pain Score  0 *  Have you tolerated food without any problems? yes  Have you been able to return to your normal activities? yes  Do you have any questions about your discharge instructions: Diet   no Medications  no Follow up visit  no  Do you have questions or concerns about your Care? no  Actions: * If pain score is 4 or above: No action needed, pain <4.

## 2013-02-04 ENCOUNTER — Ambulatory Visit (INDEPENDENT_AMBULATORY_CARE_PROVIDER_SITE_OTHER): Payer: BC Managed Care – PPO | Admitting: Family Medicine

## 2013-02-04 ENCOUNTER — Encounter: Payer: Self-pay | Admitting: Gastroenterology

## 2013-02-04 ENCOUNTER — Encounter: Payer: Self-pay | Admitting: Family Medicine

## 2013-02-04 VITALS — BP 110/64 | HR 93 | Temp 99.1°F | Ht 65.0 in | Wt 221.8 lb

## 2013-02-04 DIAGNOSIS — A084 Viral intestinal infection, unspecified: Secondary | ICD-10-CM

## 2013-02-04 DIAGNOSIS — A088 Other specified intestinal infections: Secondary | ICD-10-CM

## 2013-02-04 MED ORDER — HYOSCYAMINE SULFATE 0.125 MG SL SUBL: SUBLINGUAL_TABLET | SUBLINGUAL | Status: DC

## 2013-02-04 NOTE — Patient Instructions (Addendum)
I think you have a viral GI bug - take tylenol for fever and hydrate well - advance diet slowly with bland food - take it easy  hyoscymine is ok for cramps if needed If worse/ abdominal pain/ no improvement in fever or any urinary or upper respiratory symptoms- please let me know

## 2013-02-04 NOTE — Progress Notes (Signed)
Subjective:    Patient ID: Brooke Mitchell, female    DOB: 01-28-1951, 62 y.o.   MRN: [id number]  HPI Here with fever  Started sat- ached all over in the evening - uncomfortable all night/ hot and temp was 102 Next 2 days fever up and down  5 am - was 102.4  Tylenol has helped significantly   Some mild diarrhea - loose stool 2 days Felt nauseated / no vomiting  Had cramping last night - took hyoscamine that helped -needs refill of this   No uri symptoms No infected appearing wounds   She found a tiny tick sat night - was crawling on neck- had not bitten her  No bites known   No sick contacts Had a flu shot this season  No urinary symptoms   Patient Active Problem List   Diagnosis Date Noted  . Colon cancer screening 10/24/2012  . Anemia 10/24/2012  . Abdominal pain, RUQ 10/24/2012  . Trochanteric bursitis of left hip 11/14/2011  . Obesity 10/19/2011  . Other screening mammogram 04/13/2011  . Routine general medical examination at a health care facility 04/07/2011  . UTI'S, RECURRENT 02/03/2010  . POSTMENOPAUSAL STATUS 03/28/2008  . HYPOTHYROIDISM 11/24/2006  . DIABETES MELLITUS, TYPE II 11/24/2006  . HYPERLIPIDEMIA 11/24/2006  . RESTLESS LEG SYNDROME 11/24/2006  . HYPERTENSION 11/24/2006  . PREMATURE VENTRICULAR CONTRACTIONS 11/24/2006  . HEMORRHOIDS 11/24/2006  . ALLERGIC RHINITIS 11/24/2006  . ASTHMA 11/24/2006  . GERD 11/24/2006  . HIATAL HERNIA 11/24/2006  . OVERACTIVE BLADDER 11/24/2006  . DISC DISEASE, CERVICAL 11/24/2006  . SLEEP APNEA, SEVERE 11/24/2006   Past Medical History  Diagnosis Date  . Allergic rhinitis   . Asthma     As a child   . GERD (gastroesophageal reflux disease)   . Hyperlipidemia   . Hypertension   . Hypothyroidism   . Diabetes mellitus     Type II (2/06 elevated microalbumin)  . Obesity   . OSA (obstructive sleep apnea)     CPAP  . Frequent UTI     with coital prohylaxis   . Anemia    Past Surgical History  Procedure  Laterality Date  . Foot surgery    . Endometrial biopsy    . Dilation and curettage of uterus     History  Substance Use Topics  . Smoking status: Never Smoker   . Smokeless tobacco: Never Used  . Alcohol Use: Yes     Comment: occasional   Family History  Problem Relation Age of Onset  . Diabetes Mother   . Coronary artery disease Mother   . Hypothyroidism Mother   . Irritable bowel syndrome Mother   . Alzheimer's disease Father   . Nephrolithiasis Father   . Hypothyroidism Brother   . Colon cancer Neg Hx    No Known Allergies Current Outpatient Prescriptions on File Prior to Visit  Medication Sig Dispense Refill  . ADVAIR DISKUS 100-50 MCG/DOSE AEPB USE 1 PUFF TWICE DAILY DURING ALLERGY SEASON  60 each  10  . albuterol (PROVENTIL,VENTOLIN) 90 MCG/ACT inhaler Inhale 2 puffs into the lungs every 4 (four) hours as needed.        Marland Kitchen aspirin 81 MG tablet Take 81 mg by mouth daily.        Marland Kitchen atorvastatin (LIPITOR) 20 MG tablet Take 0.5 tablets (10 mg total) by mouth daily.  15 tablet  11  . calcium carbonate (CALCIUM 600) 600 MG TABS Take 600 mg by mouth 2 (two) times daily  with a meal.        . cyclobenzaprine (FLEXERIL) 10 MG tablet Take 1 tablet (10 mg total) by mouth 3 (three) times daily as needed.  30 tablet  3  . fosinopril-hydrochlorothiazide (MONOPRIL-HCT) 10-12.5 MG per tablet Take 1 tablet by mouth daily.  30 tablet  11  . glipiZIDE (GLUCOTROL XL) 10 MG 24 hr tablet Take 1 tablet (10 mg total) by mouth daily.  30 tablet  11  . HYDROcodone-acetaminophen (VICODIN) 5-500 MG per tablet Take 1 tablet by mouth every 6 (six) hours as needed for pain. 1/2 to 1 tab by mouth three times a day as needed for pain  30 tablet  0  . hyoscyamine (LEVSIN SL) 0.125 MG SL tablet Place 0.125 mg under the tongue. Place one tablet under tongue once a day as needed.       Marland Kitchen levothyroxine (SYNTHROID, LEVOTHROID) 50 MCG tablet Take 1 tablet (50 mcg total) by mouth daily.  30 tablet  11  . loratadine  (CLARITIN) 10 MG tablet Take 10 mg by mouth daily.        . meloxicam (MOBIC) 15 MG tablet TAKE 1 TABLET BY MOUTH DAILY AS NEEDED WITH FOOD  90 tablet  3  . metFORMIN (GLUCOPHAGE) 1000 MG tablet Take 1 tablet (1,000 mg total) by mouth 2 (two) times daily with a meal.  60 tablet  11  . multivitamin (THERAGRAN) per tablet Take 1 tablet by mouth daily.        Marland Kitchen omeprazole (PRILOSEC OTC) 20 MG tablet Take 20 mg by mouth daily.      . ONE TOUCH ULTRA TEST test strip CHECK SUGAR TWICE A DAY AS INSTRUCTED DM 250.0 THAT IS NOT OPTIMALLY CONTROLLED  100 each  3  . PROAIR HFA 108 (90 BASE) MCG/ACT inhaler USE 2 PUFFS UPTO EVERY 4 HOURS AS NEEDED FOR WHEEZING  8.5 g  8  . traMADol (ULTRAM) 50 MG tablet TAKE 1 TABLET (50 MG TOTAL) BY MOUTH EVERY 6 (SIX) HOURS AS NEEDED FOR PAIN.  50 tablet  2   No current facility-administered medications on file prior to visit.     Review of Systems Review of Systems  Constitutional: Negative for  unexpected weight change.  Eyes: Negative for pain and visual disturbance.  Respiratory: Negative for cough and shortness of breath.   Cardiovascular: Negative for cp or palpitations    Gastrointestinal: Negative for constipation/ vomiting or blood in stool/ dark stool Genitourinary: Negative for urgency and frequency.  Skin: Negative for pallor or rash   Neurological: Negative for weakness, light-headedness, numbness and headaches.  Hematological: Negative for adenopathy. Does not bruise/bleed easily.  Psychiatric/Behavioral: Negative for dysphoric mood. The patient is not nervous/anxious.         Objective:   Physical Exam  Constitutional: She appears well-developed and well-nourished.  HENT:  Head: Normocephalic and atraumatic.  Right Ear: External ear normal.  Left Ear: External ear normal.  Nose: Nose normal.  Mouth/Throat: Oropharynx is clear and moist.  Eyes: Conjunctivae and EOM are normal. Pupils are equal, round, and reactive to light. Right eye exhibits  no discharge. Left eye exhibits no discharge. No scleral icterus.  Neck: Normal range of motion. Neck supple. No JVD present. Carotid bruit is not present. No thyromegaly present.  Cardiovascular: Normal rate, regular rhythm, normal heart sounds and intact distal pulses.  Exam reveals no gallop.   Pulmonary/Chest: Effort normal and breath sounds normal. No respiratory distress. She has no wheezes. She has  no rales.  Abdominal: Soft. Bowel sounds are normal. She exhibits no distension, no abdominal bruit and no mass. There is tenderness. There is no rebound and no guarding.  Mild diffuse abd tenderness without rebound or guarding  Musculoskeletal: She exhibits no edema.  Lymphadenopathy:    She has no cervical adenopathy.  Neurological: She is alert. She has normal reflexes. No cranial nerve deficit. She exhibits normal muscle tone. Coordination normal.  Skin: Skin is warm and dry. No rash noted. No erythema.  Psychiatric: She has a normal mood and affect.          Assessment & Plan:

## 2013-02-04 NOTE — Assessment & Plan Note (Signed)
With fever controlled by acetaminophen Disc symptomatic care - see instructions on AVS  If new symptoms develop-will advise Disc fluids and gradual diet adv as tolerated  Refilled hyoscamine for cramps occasionally

## 2013-02-06 ENCOUNTER — Telehealth: Payer: Self-pay | Admitting: Gastroenterology

## 2013-02-06 NOTE — Telephone Encounter (Signed)
Questions about biopsy results answered.  She will call back for any additional questions or concerns

## 2013-02-07 ENCOUNTER — Encounter: Payer: Self-pay | Admitting: Radiology

## 2013-02-08 ENCOUNTER — Ambulatory Visit (INDEPENDENT_AMBULATORY_CARE_PROVIDER_SITE_OTHER): Payer: BC Managed Care – PPO | Admitting: Family Medicine

## 2013-02-08 ENCOUNTER — Encounter: Payer: Self-pay | Admitting: Family Medicine

## 2013-02-08 VITALS — BP 114/72 | HR 98 | Temp 98.6°F | Ht 65.0 in | Wt 219.5 lb

## 2013-02-08 DIAGNOSIS — E039 Hypothyroidism, unspecified: Secondary | ICD-10-CM

## 2013-02-08 DIAGNOSIS — I10 Essential (primary) hypertension: Secondary | ICD-10-CM

## 2013-02-08 DIAGNOSIS — E119 Type 2 diabetes mellitus without complications: Secondary | ICD-10-CM

## 2013-02-08 LAB — COMPREHENSIVE METABOLIC PANEL
AST: 26 U/L (ref 0–37)
Albumin: 3.7 g/dL (ref 3.5–5.2)
Alkaline Phosphatase: 64 U/L (ref 39–117)
BUN: 14 mg/dL (ref 6–23)
Creat: 0.78 mg/dL (ref 0.50–1.10)
Potassium: 4.1 mEq/L (ref 3.5–5.3)
Total Bilirubin: 0.3 mg/dL (ref 0.3–1.2)

## 2013-02-08 NOTE — Assessment & Plan Note (Signed)
bp is stable today  No cp or palpitations or headaches or edema  No side effects to medicines  BP Readings from Last 3 Encounters:  02/08/13 114/72  02/04/13 110/64  01/22/13 162/81

## 2013-02-08 NOTE — Assessment & Plan Note (Signed)
A1c today Rev diet On ace  opthy utd Disc need for wt loss

## 2013-02-08 NOTE — Patient Instructions (Addendum)
Lab today  Watch diet for sugar and fat and stay as active as you can be I'm glad you are feeling better  Schedule PE with labs prior in 6 months

## 2013-02-08 NOTE — Assessment & Plan Note (Signed)
Inc dose last time- re check tsh today Feels fine-no clinical change

## 2013-02-08 NOTE — Progress Notes (Signed)
Subjective:    Patient ID: Brooke Mitchell, female    DOB: April 20, 1951, 62 y.o.   MRN: [id number]  HPI Here for f/u of chronic medical problems  Is finally feeling better from her GI virus  Still weak but getting there   Wt is down 3 lb with bmi of 36 Had n/v/d  No more fever  Belly rumbles but improved - stools are more formed   Diabetes Home sugar results - are doing ok - nothing out of the ordinary  DM diet - slowly getting back to regular diet  Exercise  Symptoms-no hypoglycmemia  A1C last  Lab Results  Component Value Date   HGBA1C 7.0* 10/08/2012    No problems with medications  Renal protection on ace  Last eye exam 9/13 ok    bp is stable today  No cp or palpitations or headaches or edema  No side effects to medicines  BP Readings from Last 3 Encounters:  02/08/13 114/72  02/04/13 110/64  01/22/13 162/81      Hypothyroid Hypothyroidism  Pt has no clinical changes No change in energy level/ hair or skin/ edema and no tremor Lab Results  Component Value Date   TSH 8.15* 10/08/2012    Due to check again  Patient Active Problem List   Diagnosis Date Noted  . Viral gastroenteritis 02/04/2013  . Colon cancer screening 10/24/2012  . Anemia 10/24/2012  . Abdominal pain, RUQ 10/24/2012  . Trochanteric bursitis of left hip 11/14/2011  . Obesity 10/19/2011  . Other screening mammogram 04/13/2011  . Routine general medical examination at a health care facility 04/07/2011  . UTI'S, RECURRENT 02/03/2010  . POSTMENOPAUSAL STATUS 03/28/2008  . HYPOTHYROIDISM 11/24/2006  . DIABETES MELLITUS, TYPE II 11/24/2006  . HYPERLIPIDEMIA 11/24/2006  . RESTLESS LEG SYNDROME 11/24/2006  . HYPERTENSION 11/24/2006  . PREMATURE VENTRICULAR CONTRACTIONS 11/24/2006  . HEMORRHOIDS 11/24/2006  . ALLERGIC RHINITIS 11/24/2006  . ASTHMA 11/24/2006  . GERD 11/24/2006  . HIATAL HERNIA 11/24/2006  . OVERACTIVE BLADDER 11/24/2006  . DISC DISEASE, CERVICAL 11/24/2006  . SLEEP APNEA,  SEVERE 11/24/2006   Past Medical History  Diagnosis Date  . Allergic rhinitis   . Asthma     As a child   . GERD (gastroesophageal reflux disease)   . Hyperlipidemia   . Hypertension   . Hypothyroidism   . Diabetes mellitus     Type II (2/06 elevated microalbumin)  . Obesity   . OSA (obstructive sleep apnea)     CPAP  . Frequent UTI     with coital prohylaxis   . Anemia    Past Surgical History  Procedure Laterality Date  . Foot surgery    . Endometrial biopsy    . Dilation and curettage of uterus     History  Substance Use Topics  . Smoking status: Never Smoker   . Smokeless tobacco: Never Used  . Alcohol Use: Yes     Comment: occasional   Family History  Problem Relation Age of Onset  . Diabetes Mother   . Coronary artery disease Mother   . Hypothyroidism Mother   . Irritable bowel syndrome Mother   . Alzheimer's disease Father   . Nephrolithiasis Father   . Hypothyroidism Brother   . Colon cancer Neg Hx    No Known Allergies Current Outpatient Prescriptions on File Prior to Visit  Medication Sig Dispense Refill  . acetaminophen (TYLENOL) 325 MG tablet Take 650 mg by mouth as needed for pain.      Marland Kitchen  ADVAIR DISKUS 100-50 MCG/DOSE AEPB USE 1 PUFF TWICE DAILY DURING ALLERGY SEASON  60 each  10  . albuterol (PROVENTIL,VENTOLIN) 90 MCG/ACT inhaler Inhale 2 puffs into the lungs every 4 (four) hours as needed.        Marland Kitchen aspirin 81 MG tablet Take 81 mg by mouth daily.        Marland Kitchen atorvastatin (LIPITOR) 20 MG tablet Take 0.5 tablets (10 mg total) by mouth daily.  15 tablet  11  . calcium carbonate (CALCIUM 600) 600 MG TABS Take 600 mg by mouth 2 (two) times daily with a meal.        . cyclobenzaprine (FLEXERIL) 10 MG tablet Take 1 tablet (10 mg total) by mouth 3 (three) times daily as needed.  30 tablet  3  . fosinopril-hydrochlorothiazide (MONOPRIL-HCT) 10-12.5 MG per tablet Take 1 tablet by mouth daily.  30 tablet  11  . glipiZIDE (GLUCOTROL XL) 10 MG 24 hr tablet Take 1  tablet (10 mg total) by mouth daily.  30 tablet  11  . HYDROcodone-acetaminophen (VICODIN) 5-500 MG per tablet Take 1 tablet by mouth every 6 (six) hours as needed for pain. 1/2 to 1 tab by mouth three times a day as needed for pain  30 tablet  0  . hyoscyamine (LEVSIN SL) 0.125 MG SL tablet Place one tablet under tongue once a day as needed.  30 tablet  1  . levothyroxine (SYNTHROID, LEVOTHROID) 50 MCG tablet Take 1 tablet (50 mcg total) by mouth daily.  30 tablet  11  . loratadine (CLARITIN) 10 MG tablet Take 10 mg by mouth daily.        . meloxicam (MOBIC) 15 MG tablet TAKE 1 TABLET BY MOUTH DAILY AS NEEDED WITH FOOD  90 tablet  3  . metFORMIN (GLUCOPHAGE) 1000 MG tablet Take 1 tablet (1,000 mg total) by mouth 2 (two) times daily with a meal.  60 tablet  11  . multivitamin (THERAGRAN) per tablet Take 1 tablet by mouth daily.        Marland Kitchen omeprazole (PRILOSEC OTC) 20 MG tablet Take 20 mg by mouth daily.      . ONE TOUCH ULTRA TEST test strip CHECK SUGAR TWICE A DAY AS INSTRUCTED DM 250.0 THAT IS NOT OPTIMALLY CONTROLLED  100 each  3  . PROAIR HFA 108 (90 BASE) MCG/ACT inhaler USE 2 PUFFS UPTO EVERY 4 HOURS AS NEEDED FOR WHEEZING  8.5 g  8  . traMADol (ULTRAM) 50 MG tablet TAKE 1 TABLET (50 MG TOTAL) BY MOUTH EVERY 6 (SIX) HOURS AS NEEDED FOR PAIN.  50 tablet  2   No current facility-administered medications on file prior to visit.      Review of Systems Review of Systems  Constitutional: Negative for fever, appetite change, fatigue and unexpected weight change.  Eyes: Negative for pain and visual disturbance.  Respiratory: Negative for cough and shortness of breath.   Cardiovascular: Negative for cp or palpitations    Gastrointestinal: Negative for nausea, diarrhea and constipation.  Genitourinary: Negative for urgency and frequency.  Skin: Negative for pallor or rash   Neurological: Negative for weakness, light-headedness, numbness and headaches.  Hematological: Negative for adenopathy.  Does not bruise/bleed easily.  Psychiatric/Behavioral: Negative for dysphoric mood. The patient is not nervous/anxious.         Objective:   Physical Exam  Constitutional: She appears well-developed and well-nourished. No distress.  obese and well appearing   HENT:  Head: Normocephalic and atraumatic.  Mouth/Throat:  Oropharynx is clear and moist.  Eyes: Conjunctivae and EOM are normal. Pupils are equal, round, and reactive to light.  Neck: Normal range of motion. Neck supple. No JVD present. Carotid bruit is not present. No thyromegaly present.  Cardiovascular: Normal rate, regular rhythm, normal heart sounds and intact distal pulses.  Exam reveals no gallop.   Pulmonary/Chest: Effort normal and breath sounds normal. No respiratory distress. She has no wheezes. She exhibits no tenderness.  Abdominal: Soft. Bowel sounds are normal. She exhibits no distension, no abdominal bruit and no mass. There is no tenderness.  Musculoskeletal: She exhibits no edema.  Lymphadenopathy:    She has no cervical adenopathy.  Neurological: She is alert. She has normal reflexes. No cranial nerve deficit. She exhibits normal muscle tone. Coordination normal.  Skin: Skin is warm and dry. No rash noted. No erythema. No pallor.  Psychiatric: She has a normal mood and affect.          Assessment & Plan:

## 2013-02-11 ENCOUNTER — Encounter: Payer: Self-pay | Admitting: *Deleted

## 2013-02-19 ENCOUNTER — Encounter: Payer: Self-pay | Admitting: Family Medicine

## 2013-02-26 ENCOUNTER — Other Ambulatory Visit: Payer: Self-pay | Admitting: Family Medicine

## 2013-02-27 ENCOUNTER — Telehealth: Payer: Self-pay | Admitting: Family Medicine

## 2013-02-27 NOTE — Telephone Encounter (Signed)
Patient advised.

## 2013-02-27 NOTE — Telephone Encounter (Signed)
Can wait until tomorrow

## 2013-02-27 NOTE — Telephone Encounter (Signed)
Patient Information:  Caller Name: Aloni  Phone: [phone]  Patient: Brooke Mitchell, Brooke Mitchell  Gender: Female  DOB: 1951-07-28  Age: 62 Years  PCP: [facility] [employer] [facility])  Office Follow Up:  Does the office need to follow up with this patient?: Yes  Instructions For The Office: PLS READ RN NOTE  RN Note:  Pt has history of Shoulder injury, finished Physical Therapy years ago, Pt picked up trash on 6-25, felt a pop w/ sudden onset of severe pain.  Pt denies numbness/tingling, SOB or Chest Pain. Pt has appt w/ Dr Patsy Lager at 11:45 on 6-26, scheduled prior to traige.  No same day appts remaining. Pt has seen Dr Patsy Lager in past for Shoulder injury.  Pt is unable to move Shoulder above her head, can only lift at 45 degree angle.  Pt has Vicoden and Ultram PRN meds at home.  Discussed taking Vicoden for pain relief, applying cold pack and to call back if pain became unbearable or joint swelling.  PLEASE DISCUSS W/ MD AND SEE IF PT CAN BE WORKED IN SAME DAY ON 6-25 DUE TO NOT ABLE TO MOVE SHOULDER NORMALLY W/ HISOTRY OF INJURY IN PREVIOUS COUPLE OF YEARS.  Symptoms  Reason For Call & Symptoms: Right Shoulder Pain, after history of Injury  Reviewed Health History In EMR: Yes  Reviewed Medications In EMR: Yes  Reviewed Allergies In EMR: Yes  Reviewed Surgeries / Procedures: Yes  Date of Onset of Symptoms: 02/27/2013  Guideline(s) Used:  Shoulder Injury  Disposition Per Guideline:   See Today in Office  Reason For Disposition Reached:   Can't move injured shoulder normally (e.g., full range of motion, able to touch top of head)  Advice Given:  N/A  Patient Will Follow Care Advice:  YES

## 2013-02-28 ENCOUNTER — Ambulatory Visit (INDEPENDENT_AMBULATORY_CARE_PROVIDER_SITE_OTHER)
Admission: RE | Admit: 2013-02-28 | Discharge: 2013-02-28 | Disposition: A | Discharge status: Home / Self Care | Payer: BC Managed Care – PPO | Source: Ambulatory Visit | Attending: Family Medicine | Admitting: Family Medicine

## 2013-02-28 ENCOUNTER — Ambulatory Visit (INDEPENDENT_AMBULATORY_CARE_PROVIDER_SITE_OTHER): Payer: BC Managed Care – PPO | Admitting: Family Medicine

## 2013-02-28 ENCOUNTER — Encounter: Payer: Self-pay | Admitting: Family Medicine

## 2013-02-28 VITALS — BP 130/70 | HR 86 | Temp 97.9°F | Ht 65.0 in | Wt 221.8 lb

## 2013-02-28 DIAGNOSIS — M25519 Pain in unspecified shoulder: Secondary | ICD-10-CM

## 2013-02-28 DIAGNOSIS — M25511 Pain in right shoulder: Secondary | ICD-10-CM

## 2013-02-28 NOTE — Progress Notes (Signed)
Nature conservation officer at [facility] [address] [place] [address] Phone: [phone] Fax: [phone]  Date:  02/28/2013   Name:  Brooke Mitchell   DOB:  03/12/1951   MRN:  [id number] Gender: female Age: 62 y.o.  Primary Physician:  Roxy Manns, MD  Evaluating MD: Hannah Beat, MD   Chief Complaint: Shoulder Pain   History of Present Illness:  Brooke Mitchell is a 62 y.o. pleasant patient who presents with the following:  Right shoulder, has done some therapy in the ast. Was throwing away good. Lifted out bag and had a lot of pain down arm and across her shoulder. Could do minimal and alternated ice an dheat. Eased off some thiis morning.   The patient noted above presents with shoulder pain that has been ongoing for 1 day.  History of acute pain with lifting with the right. The patient denies neck pain or radicular symptoms. Denies dislocation, subluxation, separation of the shoulder. The patient does complain of pain in the overhead plane.  Ice or Heat: y Tried PT: No  Prior shoulder Injury: No Prior surgery: No Prior fracture: No  Patient Active Problem List   Diagnosis Date Noted  . Viral gastroenteritis 02/04/2013  . Colon cancer screening 10/24/2012  . Anemia 10/24/2012  . Abdominal pain, RUQ 10/24/2012  . Trochanteric bursitis of left hip 11/14/2011  . Obesity 10/19/2011  . Other screening mammogram 04/13/2011  . Routine general medical examination at a health care facility 04/07/2011  . UTI'S, RECURRENT 02/03/2010  . POSTMENOPAUSAL STATUS 03/28/2008  . HYPOTHYROIDISM 11/24/2006  . DIABETES MELLITUS, TYPE II 11/24/2006  . HYPERLIPIDEMIA 11/24/2006  . RESTLESS LEG SYNDROME 11/24/2006  . HYPERTENSION 11/24/2006  . PREMATURE VENTRICULAR CONTRACTIONS 11/24/2006  . HEMORRHOIDS 11/24/2006  . ALLERGIC RHINITIS 11/24/2006  . ASTHMA 11/24/2006  . GERD 11/24/2006  . HIATAL HERNIA 11/24/2006  . OVERACTIVE BLADDER 11/24/2006  . DISC DISEASE,  CERVICAL 11/24/2006  . SLEEP APNEA, SEVERE 11/24/2006    Past Medical History  Diagnosis Date  . Allergic rhinitis   . Asthma     As a child   . GERD (gastroesophageal reflux disease)   . Hyperlipidemia   . Hypertension   . Hypothyroidism   . Diabetes mellitus     Type II (2/06 elevated microalbumin)  . Obesity   . OSA (obstructive sleep apnea)     CPAP  . Frequent UTI     with coital prohylaxis   . Anemia     Past Surgical History  Procedure Laterality Date  . Foot surgery    . Endometrial biopsy    . Dilation and curettage of uterus      History   Social History  . Marital Status: Married    Spouse Name: N/A    Number of Children: 2  . Years of Education: N/A   Occupational History  . Runner, broadcasting/film/video   .     Social History Main Topics  . Smoking status: Never Smoker   . Smokeless tobacco: Never Used  . Alcohol Use: Yes     Comment: occasional  . Drug Use: No  . Sexually Active: Not on file   Other Topics Concern  . Not on file   Social History Narrative   Some walking for exercise          Family History  Problem Relation Age of Onset  . Diabetes Mother   . Coronary artery disease Mother   . Hypothyroidism Mother   .  Irritable bowel syndrome Mother   . Alzheimer's disease Father   . Nephrolithiasis Father   . Hypothyroidism Brother   . Colon cancer Neg Hx     No Known Allergies  Medication list has been reviewed and updated.  Outpatient Prescriptions Prior to Visit  Medication Sig Dispense Refill  . acetaminophen (TYLENOL) 325 MG tablet Take 650 mg by mouth as needed for pain.      Marland Kitchen ADVAIR DISKUS 100-50 MCG/DOSE AEPB USE 1 PUFF TWICE DAILY DURING ALLERGY SEASON  60 each  10  . albuterol (PROVENTIL,VENTOLIN) 90 MCG/ACT inhaler Inhale 2 puffs into the lungs every 4 (four) hours as needed.        Marland Kitchen aspirin 81 MG tablet Take 81 mg by mouth daily.        Marland Kitchen atorvastatin (LIPITOR) 20 MG tablet Take 0.5 tablets (10 mg total) by mouth daily.  15  tablet  11  . calcium carbonate (CALCIUM 600) 600 MG TABS Take 600 mg by mouth 2 (two) times daily with a meal.        . cyclobenzaprine (FLEXERIL) 10 MG tablet Take 1 tablet (10 mg total) by mouth 3 (three) times daily as needed.  30 tablet  3  . fosinopril-hydrochlorothiazide (MONOPRIL-HCT) 10-12.5 MG per tablet Take 1 tablet by mouth daily.  30 tablet  11  . glipiZIDE (GLUCOTROL XL) 10 MG 24 hr tablet Take 1 tablet (10 mg total) by mouth daily.  30 tablet  11  . HYDROcodone-acetaminophen (VICODIN) 5-500 MG per tablet Take 1 tablet by mouth every 6 (six) hours as needed for pain. 1/2 to 1 tab by mouth three times a day as needed for pain  30 tablet  0  . hyoscyamine (LEVSIN SL) 0.125 MG SL tablet Place one tablet under tongue once a day as needed.  30 tablet  1  . levothyroxine (SYNTHROID, LEVOTHROID) 50 MCG tablet Take 1 tablet (50 mcg total) by mouth daily.  30 tablet  11  . loratadine (CLARITIN) 10 MG tablet Take 10 mg by mouth daily.        . meloxicam (MOBIC) 15 MG tablet TAKE 1 TABLET BY MOUTH DAILY AS NEEDED WITH FOOD  90 tablet  3  . metFORMIN (GLUCOPHAGE) 1000 MG tablet Take 1 tablet (1,000 mg total) by mouth 2 (two) times daily with a meal.  60 tablet  11  . multivitamin (THERAGRAN) per tablet Take 1 tablet by mouth daily.        Marland Kitchen omeprazole (PRILOSEC OTC) 20 MG tablet Take 20 mg by mouth daily.      . ONE TOUCH ULTRA TEST test strip CHECK SUGAR TWICE A DAY AS INSTRUCTED (DM 250.0) THAT IS NOT OPTIMALLY CONTROLLED  100 each  1  . traMADol (ULTRAM) 50 MG tablet TAKE 1 TABLET (50 MG TOTAL) BY MOUTH EVERY 6 (SIX) HOURS AS NEEDED FOR PAIN.  50 tablet  2  . PROAIR HFA 108 (90 BASE) MCG/ACT inhaler USE 2 PUFFS UPTO EVERY 4 HOURS AS NEEDED FOR WHEEZING  8.5 g  8   No facility-administered medications prior to visit.    Review of Systems:   GEN: No fevers, chills. Nontoxic. Primarily MSK c/o today. MSK: Detailed in the HPI GI: tolerating PO intake without difficulty Neuro: No numbness,  parasthesias, or tingling associated. Otherwise the pertinent positives of the ROS are noted above.    Physical Examination: BP 130/70  Pulse 86  Temp(Src) 97.9 F (36.6 C) (Oral)  Ht 5\' 5"  (1.651  m)  Wt 221 lb 12.8 oz (100.608 kg)  BMI 36.91 kg/m2  SpO2 97%  Ideal Body Weight: Weight in (lb) to have BMI = 25: 149.9   GEN: WDWN, NAD, Non-toxic, Alert & Oriented x 3 HEENT: Atraumatic, Normocephalic.  Ears and Nose: No external deformity. EXTR: No clubbing/cyanosis/edema NEURO: Normal gait.  PSYCH: Normally interactive. Conversant. Not depressed or anxious appearing.  Calm demeanor.   Shoulder: R Inspection: No muscle wasting or winging Ecchymosis/edema: neg  AC joint, scapula, clavicle: NT Cervical spine: NT, full ROM Spurling's: neg Abduction: full, 5/5 - some painful arc of motion Flexion: full, 5/5 IR, full, lift-off: 5/5 ER at neutral: full, 5/5 AC crossover and compression: pos on R Neer: neg Hawkins: neg Drop Test: neg Empty Can: neg Supraspinatus insertion: NT Bicipital groove: NT Speed's: neg Yergason's: neg Sulcus sign: neg Scapular dyskinesis: none C5-T1 intact Sensation intact Grip 5/5   Assessment and Plan: Shoulder pain, right - Plan: DG Shoulder Right   Reassured. Maybe small partial cuff tear. Maintain ROM, ice, heat. Given rom program from [place].  Signed, Elpidio Galea. Mazzie Brodrick, MD 02/28/2013 12:14 PM

## 2013-03-14 ENCOUNTER — Other Ambulatory Visit: Payer: Self-pay

## 2013-03-22 ENCOUNTER — Other Ambulatory Visit: Payer: Self-pay | Admitting: Family Medicine

## 2013-03-25 ENCOUNTER — Other Ambulatory Visit: Payer: Self-pay | Admitting: Family Medicine

## 2013-03-27 ENCOUNTER — Other Ambulatory Visit: Payer: Self-pay | Admitting: Family Medicine

## 2013-04-03 ENCOUNTER — Other Ambulatory Visit: Payer: Self-pay | Admitting: Family Medicine

## 2013-04-22 ENCOUNTER — Ambulatory Visit (INDEPENDENT_AMBULATORY_CARE_PROVIDER_SITE_OTHER): Payer: BC Managed Care – PPO | Admitting: Family Medicine

## 2013-04-22 ENCOUNTER — Encounter: Payer: Self-pay | Admitting: Family Medicine

## 2013-04-22 VITALS — BP 124/74 | HR 95 | Temp 98.8°F | Ht 65.0 in | Wt 219.5 lb

## 2013-04-22 DIAGNOSIS — M79605 Pain in left leg: Secondary | ICD-10-CM | POA: Insufficient documentation

## 2013-04-22 DIAGNOSIS — M79609 Pain in unspecified limb: Secondary | ICD-10-CM

## 2013-04-22 DIAGNOSIS — M76899 Other specified enthesopathies of unspecified lower limb, excluding foot: Secondary | ICD-10-CM

## 2013-04-22 DIAGNOSIS — M7062 Trochanteric bursitis, left hip: Secondary | ICD-10-CM

## 2013-04-22 MED ORDER — METAXALONE 800 MG PO TABS: 800.0000 mg | ORAL_TABLET | Freq: Three times a day (TID) | ORAL | Status: DC

## 2013-04-22 NOTE — Progress Notes (Signed)
Subjective:    Patient ID: Brooke Mitchell, female    DOB: 12/23/1950, 62 y.o.   MRN: [id number]  HPI Here with L hip pain Had a severe L leg cramp 1.5 weeks ago in the middle of the night -that got better Now she has persistantly sore from lateral buttock all the way down her leg  This feels different than the bursitis in the past  This feels more muscular  She tried flexeril - did not help much  She takes meloxicam daily  Tylenol -arthritis strength Tramadol-not a big difference either   Hurts to bear wt Not numb or weak   Patient Active Problem List   Diagnosis Date Noted  . Viral gastroenteritis 02/04/2013  . Colon cancer screening 10/24/2012  . Anemia 10/24/2012  . Abdominal pain, RUQ 10/24/2012  . Trochanteric bursitis of left hip 11/14/2011  . Obesity 10/19/2011  . Other screening mammogram 04/13/2011  . Routine general medical examination at a health care facility 04/07/2011  . UTI'S, RECURRENT 02/03/2010  . POSTMENOPAUSAL STATUS 03/28/2008  . HYPOTHYROIDISM 11/24/2006  . DIABETES MELLITUS, TYPE II 11/24/2006  . HYPERLIPIDEMIA 11/24/2006  . RESTLESS LEG SYNDROME 11/24/2006  . HYPERTENSION 11/24/2006  . PREMATURE VENTRICULAR CONTRACTIONS 11/24/2006  . HEMORRHOIDS 11/24/2006  . ALLERGIC RHINITIS 11/24/2006  . ASTHMA 11/24/2006  . GERD 11/24/2006  . HIATAL HERNIA 11/24/2006  . OVERACTIVE BLADDER 11/24/2006  . DISC DISEASE, CERVICAL 11/24/2006  . SLEEP APNEA, SEVERE 11/24/2006   Past Medical History  Diagnosis Date  . Allergic rhinitis   . Asthma     As a child   . GERD (gastroesophageal reflux disease)   . Hyperlipidemia   . Hypertension   . Hypothyroidism   . Diabetes mellitus     Type II (2/06 elevated microalbumin)  . Obesity   . OSA (obstructive sleep apnea)     CPAP  . Frequent UTI     with coital prohylaxis   . Anemia    Past Surgical History  Procedure Laterality Date  . Foot surgery    . Endometrial biopsy    . Dilation and curettage  of uterus     History  Substance Use Topics  . Smoking status: Never Smoker   . Smokeless tobacco: Never Used  . Alcohol Use: Yes     Comment: occasional   Family History  Problem Relation Age of Onset  . Diabetes Mother   . Coronary artery disease Mother   . Hypothyroidism Mother   . Irritable bowel syndrome Mother   . Alzheimer's disease Father   . Nephrolithiasis Father   . Hypothyroidism Brother   . Colon cancer Neg Hx    No Known Allergies Current Outpatient Prescriptions on File Prior to Visit  Medication Sig Dispense Refill  . acetaminophen (TYLENOL) 325 MG tablet Take 650 mg by mouth as needed for pain.      Marland Kitchen ADVAIR DISKUS 100-50 MCG/DOSE AEPB USE 1 PUFF TWICE DAILY DURING ALLERGY SEASON  60 each  10  . albuterol (PROVENTIL,VENTOLIN) 90 MCG/ACT inhaler Inhale 2 puffs into the lungs every 4 (four) hours as needed.        Marland Kitchen aspirin 81 MG tablet Take 81 mg by mouth daily.        Marland Kitchen atorvastatin (LIPITOR) 20 MG tablet TAKE 1/2 TABLET (10 MG TOTAL) BY MOUTH DAILY.  15 tablet  3  . calcium carbonate (CALCIUM 600) 600 MG TABS Take 600 mg by mouth 2 (two) times daily with a meal.        .  cyclobenzaprine (FLEXERIL) 10 MG tablet Take 1 tablet (10 mg total) by mouth 3 (three) times daily as needed.  30 tablet  3  . fosinopril-hydrochlorothiazide (MONOPRIL-HCT) 10-12.5 MG per tablet TAKE 1 TABLET BY MOUTH DAILY.  30 tablet  3  . glipiZIDE (GLUCOTROL XL) 10 MG 24 hr tablet Take 1 tablet (10 mg total) by mouth daily.  30 tablet  11  . HYDROcodone-acetaminophen (VICODIN) 5-500 MG per tablet Take 1 tablet by mouth every 6 (six) hours as needed for pain. 1/2 to 1 tab by mouth three times a day as needed for pain  30 tablet  0  . hyoscyamine (LEVSIN SL) 0.125 MG SL tablet Place one tablet under tongue once a day as needed.  30 tablet  1  . levothyroxine (SYNTHROID, LEVOTHROID) 50 MCG tablet Take 1 tablet (50 mcg total) by mouth daily.  30 tablet  11  . loratadine (CLARITIN) 10 MG tablet  Take 10 mg by mouth daily.        . meloxicam (MOBIC) 15 MG tablet TAKE 1 TABLET BY MOUTH DAILY AS NEEDED WITH FOOD  90 tablet  3  . metFORMIN (GLUCOPHAGE) 1000 MG tablet Take 1 tablet (1,000 mg total) by mouth 2 (two) times daily with a meal.  60 tablet  11  . multivitamin (THERAGRAN) per tablet Take 1 tablet by mouth daily.        Marland Kitchen omeprazole (PRILOSEC OTC) 20 MG tablet Take 20 mg by mouth daily.      . ONE TOUCH ULTRA TEST test strip CHECK SUGAR TWICE A DAY AS INSTRUCTED (DM 250.0) THAT IS NOT OPTIMALLYCONTROLLED  100 each  0  . traMADol (ULTRAM) 50 MG tablet TAKE 1 TABLET (50 MG TOTAL) BY MOUTH EVERY 6 (SIX) HOURS AS NEEDED FOR PAIN.  50 tablet  2   No current facility-administered medications on file prior to visit.      Review of Systems Review of Systems  Constitutional: Negative for fever, appetite change, fatigue and unexpected weight change.  Eyes: Negative for pain and visual disturbance.  Respiratory: Negative for cough and shortness of breath.   Cardiovascular: Negative for cp or palpitations    Gastrointestinal: Negative for nausea, diarrhea and constipation.  Genitourinary: Negative for urgency and frequency.  Skin: Negative for pallor or rash   MSK pos for L leg pain without swelling or redness, neg for joint changes  Neurological: Negative for weakness, light-headedness, numbness and headaches.  Hematological: Negative for adenopathy. Does not bruise/bleed easily.  Psychiatric/Behavioral: Negative for dysphoric mood. The patient is not nervous/anxious.         Objective:   Physical Exam  Constitutional: She appears well-developed and well-nourished. No distress.  HENT:  Head: Normocephalic and atraumatic.  Mouth/Throat: Oropharynx is clear and moist.  Eyes: Conjunctivae and EOM are normal. Pupils are equal, round, and reactive to light. Right eye exhibits no discharge. Left eye exhibits no discharge. No scleral icterus.  Neck: Normal range of motion. Neck supple.   Cardiovascular: Normal rate and regular rhythm.   Pulmonary/Chest: Effort normal and breath sounds normal. No respiratory distress. She has no wheezes.  Musculoskeletal: She exhibits tenderness. She exhibits no edema.  Tender over L buttock and also L greater trochanter Pain to ext rot hip  Neg SLR No spinous tenderness No swelling/palp cords or erythema  Gait favors R leg    Lymphadenopathy:    She has no cervical adenopathy.  Neurological: She is alert. She has normal reflexes. No cranial nerve  deficit. Coordination normal.  Skin: Skin is warm and dry. No rash noted. No erythema. No pallor.  Psychiatric: She has a normal mood and affect.          Assessment & Plan:

## 2013-04-22 NOTE — Assessment & Plan Note (Signed)
This may be acting up again- after a bad leg cramp / pt is tender at greater trochanter, but also has some sore/ spasm muscles  Will try ice / relative rest and skelaxin  Is on meloxicam already Plan to f/u with Dr Patsy Lager if no further improvement

## 2013-04-22 NOTE — Patient Instructions (Addendum)
You may have bursitis again or a strained leg muscle from the cramp Try to stretch  Do not lie on that side Use ice/ cold compress on the outer leg/bursa area for 10 minutes at a time If not improving in several days- call for an appt with Dr Patsy Lager

## 2013-04-24 ENCOUNTER — Other Ambulatory Visit: Payer: Self-pay | Admitting: Family Medicine

## 2013-05-10 ENCOUNTER — Other Ambulatory Visit: Payer: Self-pay | Admitting: Family Medicine

## 2013-05-10 NOTE — Telephone Encounter (Signed)
I refilled this times one electronically

## 2013-05-10 NOTE — Telephone Encounter (Signed)
Electronic refill request, please advise  

## 2013-05-29 ENCOUNTER — Other Ambulatory Visit: Payer: Self-pay | Admitting: Family Medicine

## 2013-05-29 NOTE — Telephone Encounter (Signed)
Please ask her first how many she is needing per day and how her symptoms are -thanks

## 2013-05-29 NOTE — Telephone Encounter (Signed)
Electronic refill request please advise  FYI: med refilled on 05/10/13 but only #60 with directions of taking med TID so didn't last full month, please advise

## 2013-05-30 NOTE — Telephone Encounter (Signed)
Pt said she is only taking the Skelaxin as needed, the pharmacy had it on automatic refill and pt said we can decline request because she still has a whole bottle left, pt uses it rarely   Rx declined per pt request

## 2013-05-30 NOTE — Telephone Encounter (Signed)
Left voicemail requesting pt to call office 

## 2013-06-05 ENCOUNTER — Other Ambulatory Visit: Payer: Self-pay | Admitting: Family Medicine

## 2013-06-06 NOTE — Telephone Encounter (Signed)
Px written for call in   

## 2013-06-06 NOTE — Telephone Encounter (Signed)
Electronic refill request, please advise  

## 2013-06-06 NOTE — Telephone Encounter (Signed)
Phoned in to pharmacy. 

## 2013-06-19 ENCOUNTER — Ambulatory Visit (INDEPENDENT_AMBULATORY_CARE_PROVIDER_SITE_OTHER): Payer: BC Managed Care – PPO | Admitting: Family Medicine

## 2013-06-19 ENCOUNTER — Encounter: Payer: Self-pay | Admitting: Family Medicine

## 2013-06-19 VITALS — BP 124/78 | HR 96 | Temp 98.4°F | Ht 65.0 in | Wt 219.8 lb

## 2013-06-19 DIAGNOSIS — M67919 Unspecified disorder of synovium and tendon, unspecified shoulder: Secondary | ICD-10-CM

## 2013-06-19 DIAGNOSIS — Z23 Encounter for immunization: Secondary | ICD-10-CM

## 2013-06-19 DIAGNOSIS — M7581 Other shoulder lesions, right shoulder: Secondary | ICD-10-CM

## 2013-06-19 NOTE — Patient Instructions (Signed)
REFERRAL: GO THE THE FRONT ROOM AT THE ENTRANCE OF OUR CLINIC, NEAR CHECK IN. ASK FOR Brooke Mitchell. SHE WILL HELP YOU SET UP YOUR REFERRAL. DATE: TIME:  

## 2013-06-19 NOTE — Progress Notes (Signed)
Date:  06/19/2013   Name:  Brooke Mitchell   DOB:  1951/07/05   MRN:  [id number] Gender: female Age: 62 y.o.  Primary Physician:  Roxy Manns, MD   Chief Complaint: Shoulder Pain   History of Present Illness:  Brooke Mitchell is a 62 y.o. pleasant patient who presents with the following:  Got a little bit exercises and the last six weeks, has been through a lot of stress and husband has ALS. T-shirt distrubution. Sometimes pain down arm.   Stopped some meloxicam  Pleasant patient with a history of shoulder pain that I saw in June of 62 2014. Her systems and pain have persisted, but she did getting better for somewhat, her husband does have ALS and she is doing a lot of his transfers and picking up. She now has pain with abduction and internal range of motion. Some pain in the teacher distribution. No numbness or weakness.  Patient Active Problem List   Diagnosis Date Noted  . Left leg pain 04/22/2013  . Viral gastroenteritis 02/04/2013  . Colon cancer screening 10/24/2012  . Anemia 10/24/2012  . Abdominal pain, RUQ 10/24/2012  . Trochanteric bursitis of left hip 11/14/2011  . Obesity 10/19/2011  . Other screening mammogram 04/13/2011  . Routine general medical examination at a health care facility 04/07/2011  . UTI'S, RECURRENT 02/03/2010  . POSTMENOPAUSAL STATUS 03/28/2008  . HYPOTHYROIDISM 11/24/2006  . DIABETES MELLITUS, TYPE II 11/24/2006  . HYPERLIPIDEMIA 11/24/2006  . RESTLESS LEG SYNDROME 11/24/2006  . HYPERTENSION 11/24/2006  . PREMATURE VENTRICULAR CONTRACTIONS 11/24/2006  . HEMORRHOIDS 11/24/2006  . ALLERGIC RHINITIS 11/24/2006  . ASTHMA 11/24/2006  . GERD 11/24/2006  . HIATAL HERNIA 11/24/2006  . OVERACTIVE BLADDER 11/24/2006  . DISC DISEASE, CERVICAL 11/24/2006  . SLEEP APNEA, SEVERE 11/24/2006    Past Medical History  Diagnosis Date  . Allergic rhinitis   . Asthma     As a child   . GERD (gastroesophageal reflux disease)   . Hyperlipidemia   .  Hypertension   . Hypothyroidism   . Diabetes mellitus     Type II (2/06 elevated microalbumin)  . Obesity   . OSA (obstructive sleep apnea)     CPAP  . Frequent UTI     with coital prohylaxis   . Anemia     Past Surgical History  Procedure Laterality Date  . Foot surgery    . Endometrial biopsy    . Dilation and curettage of uterus      History   Social History  . Marital Status: Married    Spouse Name: N/A    Number of Children: 2  . Years of Education: N/A   Occupational History  . Runner, broadcasting/film/video   .     Social History Main Topics  . Smoking status: Never Smoker   . Smokeless tobacco: Never Used  . Alcohol Use: Yes     Comment: occasional  . Drug Use: No  . Sexual Activity: Not on file   Other Topics Concern  . Not on file   Social History Narrative   Some walking for exercise          Family History  Problem Relation Age of Onset  . Diabetes Mother   . Coronary artery disease Mother   . Hypothyroidism Mother   . Irritable bowel syndrome Mother   . Alzheimer's disease Father   . Nephrolithiasis Father   . Hypothyroidism Brother   . Colon cancer Neg Hx  No Known Allergies  Medication list has been reviewed and updated.  Outpatient Prescriptions Prior to Visit  Medication Sig Dispense Refill  . acetaminophen (TYLENOL) 325 MG tablet Take 650 mg by mouth as needed for pain.      Marland Kitchen ADVAIR DISKUS 100-50 MCG/DOSE AEPB USE 1 PUFF TWICE DAILY DURING ALLERGY SEASON  60 each  10  . albuterol (PROVENTIL,VENTOLIN) 90 MCG/ACT inhaler Inhale 2 puffs into the lungs every 4 (four) hours as needed.        Marland Kitchen aspirin 81 MG tablet Take 81 mg by mouth daily.        Marland Kitchen atorvastatin (LIPITOR) 20 MG tablet TAKE 1/2 TABLET (10 MG TOTAL) BY MOUTH DAILY.  15 tablet  3  . calcium carbonate (CALCIUM 600) 600 MG TABS Take 600 mg by mouth 2 (two) times daily with a meal.        . fosinopril-hydrochlorothiazide (MONOPRIL-HCT) 10-12.5 MG per tablet TAKE 1 TABLET BY MOUTH DAILY.  30  tablet  3  . glipiZIDE (GLUCOTROL XL) 10 MG 24 hr tablet Take 1 tablet (10 mg total) by mouth daily.  30 tablet  11  . HYDROcodone-acetaminophen (VICODIN) 5-500 MG per tablet Take 1 tablet by mouth every 6 (six) hours as needed for pain. 1/2 to 1 tab by mouth three times a day as needed for pain  30 tablet  0  . hyoscyamine (LEVSIN SL) 0.125 MG SL tablet Place one tablet under tongue once a day as needed.  30 tablet  1  . levothyroxine (SYNTHROID, LEVOTHROID) 50 MCG tablet Take 1 tablet (50 mcg total) by mouth daily.  30 tablet  11  . loratadine (CLARITIN) 10 MG tablet Take 10 mg by mouth daily.        . meloxicam (MOBIC) 15 MG tablet TAKE 1 TABLET BY MOUTH DAILY AS NEEDED WITH FOOD  90 tablet  3  . metaxalone (SKELAXIN) 800 MG tablet TAKE 1 TABLET  BY MOUTH 3  TIMES DAILY. WITH CAUTION OF SEDATION  60 tablet  0  . metFORMIN (GLUCOPHAGE) 1000 MG tablet TAKE 1 TABLET (1,000 MG TOTAL) BY MOUTH 2 (TWO) TIMES DAILY WITH A MEAL.  60 tablet  2  . multivitamin (THERAGRAN) per tablet Take 1 tablet by mouth daily.        Marland Kitchen omeprazole (PRILOSEC OTC) 20 MG tablet Take 20 mg by mouth daily.      . ONE TOUCH ULTRA TEST test strip CHECK SUGAR TWICE A DAY AS INSTRUCTED (DM 250.0) THAT IS NOT OPTIMALLYCONTROLLED  100 each  0  . traMADol (ULTRAM) 50 MG tablet TAKE 1 TABLET (50 MG TOTAL) BY MOUTH EVERY 6 (SIX) HOURS AS NEEDED FOR PAIN.  50 tablet  0   No facility-administered medications prior to visit.    Review of Systems:   GEN: No fevers, chills. Nontoxic. Primarily MSK c/o today. MSK: Detailed in the HPI GI: tolerating PO intake without difficulty Neuro: No numbness, parasthesias, or tingling associated. Otherwise the pertinent positives of the ROS are noted above.    Physical Examination: BP 124/78  Pulse 96  Temp(Src) 98.4 F (36.9 C) (Oral)  Ht 5\' 5"  (1.651 m)  Wt 219 lb 12 oz (99.678 kg)  BMI 36.57 kg/m2  Ideal Body Weight: Weight in (lb) to have BMI = 25: 149.9   GEN:  Well-developed,well-nourished,in no acute distress; alert,appropriate and cooperative throughout examination HEENT: Normocephalic and atraumatic without obvious abnormalities. Ears, externally no deformities PULM: Breathing comfortably in no respiratory distress EXT:  No clubbing, cyanosis, or edema PSYCH: Normally interactive. Cooperative during the interview. Pleasant. Friendly and conversant. Not anxious or depressed appearing. Normal, full affect.  Shoulder: R Inspection: No muscle wasting or winging Ecchymosis/edema: neg  AC joint, scapula, clavicle: NT Cervical spine: NT, full ROM Spurling's: neg Abduction: full, 5/5, painful arc Flexion: full, 5/5 IR, full, lift-off: 5/5 ER at neutral: full, 5/5 AC crossover: neg Neer: pos Hawkins: pos Drop Test: neg Empty Can: pos Supraspinatus insertion: mild-mod T Bicipital groove: NT Speed's: neg Yergason's: neg Sulcus sign: neg Scapular dyskinesis: none C5-T1 intact  Neuro: Sensation intact Grip 5/5   Assessment and Plan: Rotator cuff tendonitis, right - Plan: Ambulatory referral to Physical Therapy   Likely brought on from continued care of ALS patient. Initiate physical therapy and inject subacromial space  SubAC Injection, R Verbal consent was obtained from the patient. Risks (including rare infection), benefits, and alternatives were explained. Patient prepped with Chloraprep and Ethyl Chloride used for anesthesia. The subacromial space was injected using the posterior approach. The patient tolerated the procedure well and had decreased pain post injection. No complications. Injection: 8 cc of Lidocaine 1% and 2 cc of Depo-Medrol 40 mg. Needle: 22 gauge   Signed,  Oluwatimilehin Balfour T. Anastasha Ortez, MD, CAQ Sports Medicine  [employer] [facility] [address] [place] [address] Phone: [phone] Fax: [phone]

## 2013-06-26 ENCOUNTER — Other Ambulatory Visit: Payer: Self-pay | Admitting: Family Medicine

## 2013-07-07 ENCOUNTER — Telehealth: Payer: Self-pay | Admitting: Family Medicine

## 2013-07-07 DIAGNOSIS — E785 Hyperlipidemia, unspecified: Secondary | ICD-10-CM

## 2013-07-07 DIAGNOSIS — Z0000 Encounter for general adult medical examination without abnormal findings: Secondary | ICD-10-CM

## 2013-07-07 DIAGNOSIS — E119 Type 2 diabetes mellitus without complications: Secondary | ICD-10-CM

## 2013-07-07 NOTE — Telephone Encounter (Signed)
Message copied by Judy Pimple on Sun Jul 07, 2013  5:57 PM ------      Message from: Alvina Chou      Created: Wed Jul 03, 2013  2:49 PM      Regarding: Lab orders for Monday, 11.3.14       Patient is scheduled for CPX labs, please order future labs, Thanks , Terri       ------

## 2013-07-08 ENCOUNTER — Other Ambulatory Visit (INDEPENDENT_AMBULATORY_CARE_PROVIDER_SITE_OTHER): Payer: BC Managed Care – PPO

## 2013-07-08 DIAGNOSIS — E119 Type 2 diabetes mellitus without complications: Secondary | ICD-10-CM

## 2013-07-08 DIAGNOSIS — E785 Hyperlipidemia, unspecified: Secondary | ICD-10-CM

## 2013-07-08 DIAGNOSIS — Z0000 Encounter for general adult medical examination without abnormal findings: Secondary | ICD-10-CM

## 2013-07-08 LAB — CBC WITH DIFFERENTIAL/PLATELET
Basophils Relative: 0.5 % (ref 0.0–3.0)
Eosinophils Absolute: 0.1 10*3/uL (ref 0.0–0.7)
Eosinophils Relative: 1.6 % (ref 0.0–5.0)
HCT: 34.1 % — ABNORMAL LOW (ref 36.0–46.0)
Hemoglobin: 11.2 g/dL — ABNORMAL LOW (ref 12.0–15.0)
Lymphs Abs: 1.8 10*3/uL (ref 0.7–4.0)
MCV: 78.6 fl (ref 78.0–100.0)
Monocytes Absolute: 0.4 10*3/uL (ref 0.1–1.0)
Neutro Abs: 4 10*3/uL (ref 1.4–7.7)
RBC: 4.34 Mil/uL (ref 3.87–5.11)
WBC: 6.4 10*3/uL (ref 4.5–10.5)

## 2013-07-08 LAB — TSH: TSH: 0.79 u[IU]/mL (ref 0.35–5.50)

## 2013-07-08 LAB — LIPID PANEL
Cholesterol: 171 mg/dL (ref 0–200)
VLDL: 27.4 mg/dL (ref 0.0–40.0)

## 2013-07-08 LAB — COMPREHENSIVE METABOLIC PANEL
AST: 18 U/L (ref 0–37)
Albumin: 3.7 g/dL (ref 3.5–5.2)
Alkaline Phosphatase: 52 U/L (ref 39–117)
BUN: 18 mg/dL (ref 6–23)
Creatinine, Ser: 0.7 mg/dL (ref 0.4–1.2)
GFR: 87.11 mL/min (ref >60.00)
Glucose, Bld: 80 mg/dL (ref 70–99)
Potassium: 4 mEq/L (ref 3.5–5.1)
Total Bilirubin: 0.6 mg/dL (ref 0.3–1.2)

## 2013-07-08 LAB — HEMOGLOBIN A1C: Hgb A1c MFr Bld: 7.3 % — ABNORMAL HIGH (ref 4.6–6.5)

## 2013-07-15 ENCOUNTER — Encounter: Payer: Self-pay | Admitting: Family Medicine

## 2013-07-15 ENCOUNTER — Ambulatory Visit (INDEPENDENT_AMBULATORY_CARE_PROVIDER_SITE_OTHER): Payer: BC Managed Care – PPO | Admitting: Family Medicine

## 2013-07-15 VITALS — BP 124/76 | HR 91 | Temp 98.3°F | Ht 64.25 in | Wt 220.5 lb

## 2013-07-15 DIAGNOSIS — E785 Hyperlipidemia, unspecified: Secondary | ICD-10-CM

## 2013-07-15 DIAGNOSIS — E039 Hypothyroidism, unspecified: Secondary | ICD-10-CM

## 2013-07-15 DIAGNOSIS — I10 Essential (primary) hypertension: Secondary | ICD-10-CM

## 2013-07-15 DIAGNOSIS — E119 Type 2 diabetes mellitus without complications: Secondary | ICD-10-CM

## 2013-07-15 DIAGNOSIS — E669 Obesity, unspecified: Secondary | ICD-10-CM

## 2013-07-15 DIAGNOSIS — Z0000 Encounter for general adult medical examination without abnormal findings: Secondary | ICD-10-CM

## 2013-07-15 NOTE — Patient Instructions (Signed)
Don't forget to schedule you annual mammogram A1C is up to 7.3  (not helped by the cortisone shot) Try hard to eat healthier foods and continue walking twice daily  Even a small weight loss would help  Labs are otherwise stable  If abdominal pain worsens please let me know  Follow up with me in about 3 months with labs prior

## 2013-07-15 NOTE — Assessment & Plan Note (Signed)
BP: 124/76 mmHg   bp in fair control at this time  No changes needed  Disc lifstyle change with low sodium diet and exercise   Labs reviewed

## 2013-07-15 NOTE — Progress Notes (Signed)
Pre-visit discussion using our clinic review tool. No additional management support is needed unless otherwise documented below in the visit note.  

## 2013-07-15 NOTE — Progress Notes (Signed)
Subjective:    Patient ID: Brooke Mitchell, female    DOB: Aug 22, 1951, 62 y.o.   MRN: [id number]  HPI Here for health maintenance exam and to review chronic medical problems   Is doing ok generally - most of the time  Nothing new going on   Wt is stable  bmi of 37 obese  Not eating as well as she should - not choosing the right foods - has to try to put weight on her husband and she is eating what he eats  Eating times are also out of whack   Walks to dog for exercise twice daily - to get her out - about 15 minutes for each walk  Mammogram 9/13- will make her own appt at [facility]  Self exam - no lumps or problems  Pap 6/14 nl No gyn problems at all/ no bleeding and no hx of abn paps   colonosc 2/14- is up to date on that   ( 2-3 year recall because of polyps)   opthy 4/14-no retinopathy  Td7/11 ptx 7/10  Zoster vaccine 8/12   Flu vaccine 10/14  Diabetes- not as good  Home sugar results -not checking  DM diet -not optimal  Exercise = walking  Symptoms-none / no thirst or frequent urination A1C last  Lab Results  Component Value Date   HGBA1C 7.3* 07/08/2013  was 7.0 Of note- she did have a cortisone shot in her arm /shoulder   No problems with medications  Renal protection- ace  Last eye exam 4/14     Chemistry      Component Value Date/Time   NA 141 07/08/2013 0819   K 4.0 07/08/2013 0819   CL 104 07/08/2013 0819   CO2 28 07/08/2013 0819   BUN 18 07/08/2013 0819   CREATININE 0.7 07/08/2013 0819   CREATININE 0.78 02/08/2013 1429      Component Value Date/Time   CALCIUM 9.3 07/08/2013 0819   ALKPHOS 52 07/08/2013 0819   AST 18 07/08/2013 0819   ALT 21 07/08/2013 0819   BILITOT 0.6 07/08/2013 0819     Lab Results  Component Value Date   WBC 6.4 07/08/2013   HGB 11.2* 07/08/2013   HCT 34.1* 07/08/2013   MCV 78.6 07/08/2013   PLT 267.0 07/08/2013    Lab Results  Component Value Date   CHOL 171 07/08/2013   CHOL 156 10/08/2012   CHOL 154 04/10/2012   Lab Results   Component Value Date   HDL 46.10 07/08/2013   HDL 40.98 10/08/2012   HDL 46.60 04/10/2012   Lab Results  Component Value Date   LDLCALC 98 07/08/2013   LDLCALC 82 10/08/2012   LDLCALC 79 04/10/2012   Lab Results  Component Value Date   TRIG 137.0 07/08/2013   TRIG 161.0* 10/08/2012   TRIG 142.0 04/10/2012   Lab Results  Component Value Date   CHOLHDL 4 07/08/2013   CHOLHDL 4 10/08/2012   CHOLHDL 3 04/10/2012   Lab Results  Component Value Date   LDLDIRECT 78.4 03/16/2010   LDLDIRECT 92.3 04/02/2008   LDLDIRECT 80.6 12/01/2006    Up a bit-not very significant   Patient Active Problem List   Diagnosis Date Noted  . Left leg pain 04/22/2013  . Viral gastroenteritis 02/04/2013  . Colon cancer screening 10/24/2012  . Anemia 10/24/2012  . Abdominal pain, RUQ 10/24/2012  . Trochanteric bursitis of left hip 11/14/2011  . Obesity 10/19/2011  . Other screening mammogram 04/13/2011  . Routine general  medical examination at a health care facility 04/07/2011  . UTI'S, RECURRENT 02/03/2010  . POSTMENOPAUSAL STATUS 03/28/2008  . HYPOTHYROIDISM 11/24/2006  . DIABETES MELLITUS, TYPE II 11/24/2006  . HYPERLIPIDEMIA 11/24/2006  . RESTLESS LEG SYNDROME 11/24/2006  . HYPERTENSION 11/24/2006  . PREMATURE VENTRICULAR CONTRACTIONS 11/24/2006  . HEMORRHOIDS 11/24/2006  . ALLERGIC RHINITIS 11/24/2006  . ASTHMA 11/24/2006  . GERD 11/24/2006  . HIATAL HERNIA 11/24/2006  . OVERACTIVE BLADDER 11/24/2006  . DISC DISEASE, CERVICAL 11/24/2006  . SLEEP APNEA, SEVERE 11/24/2006   Past Medical History  Diagnosis Date  . Allergic rhinitis   . Asthma     As a child   . GERD (gastroesophageal reflux disease)   . Hyperlipidemia   . Hypertension   . Hypothyroidism   . Diabetes mellitus     Type II (2/06 elevated microalbumin)  . Obesity   . OSA (obstructive sleep apnea)     CPAP  . Frequent UTI     with coital prohylaxis   . Anemia    Past Surgical History  Procedure Laterality Date  . Foot surgery     . Endometrial biopsy    . Dilation and curettage of uterus     History  Substance Use Topics  . Smoking status: Never Smoker   . Smokeless tobacco: Never Used  . Alcohol Use: Yes     Comment: occasional   Family History  Problem Relation Age of Onset  . Diabetes Mother   . Coronary artery disease Mother   . Hypothyroidism Mother   . Irritable bowel syndrome Mother   . Alzheimer's disease Father   . Nephrolithiasis Father   . Hypothyroidism Brother   . Colon cancer Neg Hx    No Known Allergies Current Outpatient Prescriptions on File Prior to Visit  Medication Sig Dispense Refill  . acetaminophen (TYLENOL) 325 MG tablet Take 650 mg by mouth as needed for pain.      Marland Kitchen ADVAIR DISKUS 100-50 MCG/DOSE AEPB USE 1 PUFF TWICE DAILY DURING ALLERGY SEASON  60 each  10  . albuterol (PROVENTIL,VENTOLIN) 90 MCG/ACT inhaler Inhale 2 puffs into the lungs every 4 (four) hours as needed.        Marland Kitchen aspirin 81 MG tablet Take 81 mg by mouth daily.        Marland Kitchen atorvastatin (LIPITOR) 20 MG tablet TAKE 1/2 TABLET (10 MG TOTAL) BY MOUTH DAILY.  15 tablet  0  . calcium carbonate (CALCIUM 600) 600 MG TABS Take 600 mg by mouth 2 (two) times daily with a meal.        . fosinopril-hydrochlorothiazide (MONOPRIL-HCT) 10-12.5 MG per tablet TAKE 1 TABLET BY MOUTH DAILY.  30 tablet  3  . glipiZIDE (GLUCOTROL XL) 10 MG 24 hr tablet Take 1 tablet (10 mg total) by mouth daily.  30 tablet  11  . HYDROcodone-acetaminophen (VICODIN) 5-500 MG per tablet Take 1 tablet by mouth every 6 (six) hours as needed for pain. 1/2 to 1 tab by mouth three times a day as needed for pain  30 tablet  0  . hyoscyamine (LEVSIN SL) 0.125 MG SL tablet Place one tablet under tongue once a day as needed.  30 tablet  1  . levothyroxine (SYNTHROID, LEVOTHROID) 50 MCG tablet Take 1 tablet (50 mcg total) by mouth daily.  30 tablet  11  . loratadine (CLARITIN) 10 MG tablet Take 10 mg by mouth daily.        . meloxicam (MOBIC) 15 MG tablet TAKE 1  TABLET BY MOUTH DAILY AS NEEDED WITH FOOD  90 tablet  3  . metaxalone (SKELAXIN) 800 MG tablet TAKE 1 TABLET  BY MOUTH 3  TIMES DAILY. WITH CAUTION OF SEDATION  60 tablet  0  . metFORMIN (GLUCOPHAGE) 1000 MG tablet TAKE 1 TABLET (1,000 MG TOTAL) BY MOUTH 2 (TWO) TIMES DAILY WITH A MEAL.  60 tablet  0  . multivitamin (THERAGRAN) per tablet Take 1 tablet by mouth daily.        Marland Kitchen omeprazole (PRILOSEC OTC) 20 MG tablet Take 20 mg by mouth daily.      . ONE TOUCH ULTRA TEST test strip CHECK SUGAR TWICE A DAY AS INSTRUCTED (DM 250.0) THAT IS NOT OPTIMALLYCONTROLLED  100 each  0  . traMADol (ULTRAM) 50 MG tablet TAKE 1 TABLET (50 MG TOTAL) BY MOUTH EVERY 6 (SIX) HOURS AS NEEDED FOR PAIN.  50 tablet  0   No current facility-administered medications on file prior to visit.     Review of Systems    Review of Systems  Constitutional: Negative for fever, appetite change, fatigue and unexpected weight change.  Eyes: Negative for pain and visual disturbance.  Respiratory: Negative for cough and shortness of breath.   Cardiovascular: Negative for cp or palpitations    Gastrointestinal: Negative for nausea, diarrhea and constipation.  Genitourinary: Negative for urgency and frequency.  Skin: Negative for pallor or rash   Neurological: Negative for weakness, light-headedness, numbness and headaches.  Hematological: Negative for adenopathy. Does not bruise/bleed easily.  Psychiatric/Behavioral: Negative for dysphoric mood. The patient is not nervous/anxious.  pos for caregiver stress     Objective:   Physical Exam  Constitutional: She appears well-developed and well-nourished. No distress.  obese and well appearing   HENT:  Head: Normocephalic and atraumatic.  Right Ear: External ear normal.  Left Ear: External ear normal.  Nose: Nose normal.  Mouth/Throat: Oropharynx is clear and moist.  Eyes: Conjunctivae and EOM are normal. Pupils are equal, round, and reactive to light. Right eye exhibits no  discharge. Left eye exhibits no discharge. No scleral icterus.  Neck: Normal range of motion. Neck supple. No JVD present. No thyromegaly present.  Cardiovascular: Normal rate, regular rhythm, normal heart sounds and intact distal pulses.  Exam reveals no gallop.   Pulmonary/Chest: Effort normal and breath sounds normal. No respiratory distress. She has no wheezes. She has no rales.  Abdominal: Soft. Bowel sounds are normal. She exhibits no distension and no mass. There is no tenderness.  Genitourinary: No breast swelling, tenderness, discharge or bleeding.  Breast exam: No mass, nodules, thickening, tenderness, bulging, retraction, inflamation, nipple discharge or skin changes noted.  No axillary or clavicular LA.  Chaperoned exam.    Musculoskeletal: She exhibits no edema and no tenderness.  Lymphadenopathy:    She has no cervical adenopathy.  Neurological: She is alert. She has normal reflexes. No cranial nerve deficit. She exhibits normal muscle tone. Coordination normal.  Skin: Skin is warm and dry. No rash noted. No erythema. No pallor.  Dermatofibroma L lower leg   Psychiatric: She has a normal mood and affect.          Assessment & Plan:

## 2013-07-15 NOTE — Assessment & Plan Note (Signed)
Lab Results  Component Value Date   HGBA1C 7.3* 07/08/2013    Disc importance of better diet/wt loss for DM control Recent steroid shot may have added to it  Lab and f/u 3 mo

## 2013-07-15 NOTE — Assessment & Plan Note (Signed)
Hypothyroidism  Pt has no clinical changes No change in energy level/ hair or skin/ edema and no tremor Lab Results  Component Value Date   TSH 0.79 07/08/2013

## 2013-07-15 NOTE — Assessment & Plan Note (Signed)
Reviewed health habits including diet and exercise and skin cancer prevention Also reviewed health mt list, fam hx and immunizations   Labs reviewed  

## 2013-07-15 NOTE — Assessment & Plan Note (Signed)
Disc goals for lipids and reasons to control them Rev labs with pt Rev low sat fat diet in detail  lipitor and diet -pt will make improved effort at diet

## 2013-07-15 NOTE — Assessment & Plan Note (Signed)
Discussed how this problem influences overall health and the risks it imposes  Reviewed plan for weight loss with lower calorie diet (via better food choices and also portion control or program like weight watchers) and exercise building up to or more than 30 minutes 5 days per week including some aerobic activity    

## 2013-07-19 ENCOUNTER — Other Ambulatory Visit: Payer: Self-pay | Admitting: Family Medicine

## 2013-07-19 ENCOUNTER — Ambulatory Visit: Payer: Self-pay | Admitting: Family Medicine

## 2013-07-19 LAB — HM MAMMOGRAPHY: HM Mammogram: NORMAL

## 2013-07-22 ENCOUNTER — Encounter: Payer: Self-pay | Admitting: Family Medicine

## 2013-07-22 NOTE — Telephone Encounter (Signed)
Electronic refill request, please advise  

## 2013-07-22 NOTE — Telephone Encounter (Signed)
Refilled electronically 

## 2013-07-23 ENCOUNTER — Encounter: Payer: Self-pay | Admitting: Family Medicine

## 2013-08-10 ENCOUNTER — Other Ambulatory Visit: Payer: Self-pay | Admitting: Family Medicine

## 2013-08-24 ENCOUNTER — Other Ambulatory Visit: Payer: Self-pay | Admitting: Family Medicine

## 2013-08-25 ENCOUNTER — Other Ambulatory Visit: Payer: Self-pay | Admitting: Family Medicine

## 2013-08-26 ENCOUNTER — Other Ambulatory Visit: Payer: Self-pay | Admitting: *Deleted

## 2013-09-22 ENCOUNTER — Other Ambulatory Visit: Payer: Self-pay | Admitting: Family Medicine

## 2013-09-23 ENCOUNTER — Other Ambulatory Visit: Payer: Self-pay | Admitting: Family Medicine

## 2013-10-09 ENCOUNTER — Other Ambulatory Visit (INDEPENDENT_AMBULATORY_CARE_PROVIDER_SITE_OTHER): Payer: BC Managed Care – PPO

## 2013-10-09 DIAGNOSIS — E039 Hypothyroidism, unspecified: Secondary | ICD-10-CM

## 2013-10-09 LAB — TSH: TSH: 0.3 u[IU]/mL — AB (ref 0.35–5.50)

## 2013-10-16 ENCOUNTER — Encounter: Payer: Self-pay | Admitting: Family Medicine

## 2013-10-16 ENCOUNTER — Ambulatory Visit (INDEPENDENT_AMBULATORY_CARE_PROVIDER_SITE_OTHER): Payer: BC Managed Care – PPO | Admitting: Family Medicine

## 2013-10-16 VITALS — BP 110/72 | HR 88 | Temp 98.5°F | Ht 64.25 in | Wt 214.5 lb

## 2013-10-16 DIAGNOSIS — F430 Acute stress reaction: Secondary | ICD-10-CM

## 2013-10-16 DIAGNOSIS — E119 Type 2 diabetes mellitus without complications: Secondary | ICD-10-CM

## 2013-10-16 MED ORDER — FLUOXETINE HCL 20 MG PO TABS: 20.0000 mg | ORAL_TABLET | Freq: Every day | ORAL | Status: DC

## 2013-10-16 NOTE — Patient Instructions (Signed)
Talk to your hospice team about options for counseling for you  Take every bit of time for yourself you can get  Start prozac 10 mg daily for 7 days and if no problems then increase to 20 (a whole pill)  If you develop worse anxiety /depression or side effects let me know  Stay active and eat a healthy diet   Follow up in about 3 months with labs prior   Check some glucose readings  2 hours after a meal

## 2013-10-16 NOTE — Progress Notes (Signed)
Pre-visit discussion using our clinic review tool. No additional management support is needed unless otherwise documented below in the visit note.  

## 2013-10-16 NOTE — Progress Notes (Signed)
Subjective:    Patient ID: Brooke Mitchell, female    DOB: 02/25/1951, 63 y.o.   MRN: [id number]  HPI Here for f/u of DM  Wt is down 6 lb   Diabetes Home sugar results are usually 90-low 100 in am , and she does not check in pm  DM diet -has been eating pretty well / sticking to the DM plan  Exercise - none except from taking care of husband and walks the dog (20 or more minutes per day) Symptoms-none  A1C last  Lab Results  Component Value Date   HGBA1C 7.3* 07/08/2013    No problems with medications  Renal protection on ace  Last eye exam 4/ 14   = will be due this spring   More moodiness from the stress of caring for her husband  Talked to hospice nurse about it  Tearful and anxious  Does not consider herself depressed  Will disc counseling resources with hospice  Has never had a mood disorder or med in the past  She is sleeping ok when she can  Appetite is fair    Patient Active Problem List   Diagnosis Date Noted  . Stress reaction 10/16/2013  . Left leg pain 04/22/2013  . Viral gastroenteritis 02/04/2013  . Colon cancer screening 10/24/2012  . Anemia 10/24/2012  . Abdominal pain, RUQ 10/24/2012  . Trochanteric bursitis of left hip 11/14/2011  . Obesity 10/19/2011  . Other screening mammogram 04/13/2011  . Routine general medical examination at a health care facility 04/07/2011  . UTI'S, RECURRENT 02/03/2010  . POSTMENOPAUSAL STATUS 03/28/2008  . HYPOTHYROIDISM 11/24/2006  . DIABETES MELLITUS, TYPE II 11/24/2006  . HYPERLIPIDEMIA 11/24/2006  . RESTLESS LEG SYNDROME 11/24/2006  . HYPERTENSION 11/24/2006  . PREMATURE VENTRICULAR CONTRACTIONS 11/24/2006  . HEMORRHOIDS 11/24/2006  . ALLERGIC RHINITIS 11/24/2006  . ASTHMA 11/24/2006  . GERD 11/24/2006  . HIATAL HERNIA 11/24/2006  . OVERACTIVE BLADDER 11/24/2006  . [place] DISEASE, CERVICAL 11/24/2006  . SLEEP APNEA, SEVERE 11/24/2006   Past Medical History  Diagnosis Date  . Allergic rhinitis   .  Asthma     As a child   . GERD (gastroesophageal reflux disease)   . Hyperlipidemia   . Hypertension   . Hypothyroidism   . Diabetes mellitus     Type II (2/06 elevated microalbumin)  . Obesity   . OSA (obstructive sleep apnea)     CPAP  . Frequent UTI     with coital prohylaxis   . Anemia    Past Surgical History  Procedure Laterality Date  . Foot surgery    . Endometrial biopsy    . Dilation and curettage of uterus     History  Substance Use Topics  . Smoking status: Never Smoker   . Smokeless tobacco: Never Used  . Alcohol Use: Yes     Comment: occasional   Family History  Problem Relation Age of Onset  . Diabetes Mother   . Coronary artery disease Mother   . Hypothyroidism Mother   . Irritable bowel syndrome Mother   . Alzheimer's disease Father   . Nephrolithiasis Father   . Hypothyroidism Brother   . Colon cancer Neg Hx    No Known Allergies Current Outpatient Prescriptions on File Prior to Visit  Medication Sig Dispense Refill  . acetaminophen (TYLENOL) 325 MG tablet Take 650 mg by mouth as needed for pain.      Marland Kitchen ADVAIR DISKUS 100-50 MCG/DOSE AEPB USE 1 PUFF TWICE  DAILY DURING ALLERGY SEASON  60 each  10  . albuterol (PROVENTIL,VENTOLIN) 90 MCG/ACT inhaler Inhale 2 puffs into the lungs every 4 (four) hours as needed.        Marland Kitchen aspirin 81 MG tablet Take 81 mg by mouth daily.        Marland Kitchen atorvastatin (LIPITOR) 20 MG tablet TAKE 1/2 TABLET (10 MG TOTAL) BY MOUTH DAILY.  15 tablet  5  . calcium carbonate (CALCIUM 600) 600 MG TABS Take 600 mg by mouth 2 (two) times daily with a meal.        . fosinopril-hydrochlorothiazide (MONOPRIL-HCT) 10-12.5 MG per tablet TAKE 1 TABLET BY MOUTH DAILY.  30 tablet  2  . GLIPIZIDE XL 10 MG 24 hr tablet TAKE 1 TABLET (10 MG TOTAL) BY MOUTH DAILY.  30 tablet  5  . HYDROcodone-acetaminophen (VICODIN) 5-500 MG per tablet Take 1 tablet by mouth every 6 (six) hours as needed for pain. 1/2 to 1 tab by mouth three times a day as needed for  pain  30 tablet  0  . hyoscyamine (LEVSIN SL) 0.125 MG SL tablet Place one tablet under tongue once a day as needed.  30 tablet  1  . loratadine (CLARITIN) 10 MG tablet Take 10 mg by mouth daily.        . meloxicam (MOBIC) 15 MG tablet TAKE 1 TABLET BY MOUTH DAILY AS NEEDED WITH FOOD  90 tablet  2  . metaxalone (SKELAXIN) 800 MG tablet TAKE 1 TABLET  BY MOUTH 3  TIMES DAILY. WITH CAUTION OF SEDATION  60 tablet  0  . metFORMIN (GLUCOPHAGE) 1000 MG tablet TAKE 1 TABLET (1,000 MG TOTAL) BY MOUTH 2 (TWO) TIMES DAILY WITH A MEAL.  60 tablet  5  . multivitamin (THERAGRAN) per tablet Take 1 tablet by mouth daily.        Marland Kitchen omeprazole (PRILOSEC OTC) 20 MG tablet Take 20 mg by mouth daily.      . ONE TOUCH ULTRA TEST test strip CHECK SUGAR TWICE A DAY AS INSTRUCTED (DM 250.0) THAT IS NOT OPTIMALLYCONTROLLED  100 each  0  . SYNTHROID 50 MCG tablet TAKE 1 TABLET (50 MCG TOTAL) BY MOUTH DAILY.  30 tablet  10  . traMADol (ULTRAM) 50 MG tablet TAKE 1 TABLET (50 MG TOTAL) BY MOUTH EVERY 6 (SIX) HOURS AS NEEDED FOR PAIN.  50 tablet  0   No current facility-administered medications on file prior to visit.    Review of Systems Review of Systems  Constitutional: Negative for fever, appetite change, fatigue and unexpected weight change.  Eyes: Negative for pain and visual disturbance.  Respiratory: Negative for cough and shortness of breath.   Cardiovascular: Negative for cp or palpitations    Gastrointestinal: Negative for nausea, diarrhea and constipation.  Genitourinary: Negative for urgency and frequency.  Skin: Negative for pallor or rash   Neurological: Negative for weakness, light-headedness, numbness and headaches.  Hematological: Negative for adenopathy. Does not bruise/bleed easily.  Psychiatric/Behavioral:pos for dysphoric mood and anxiety - from caregiver stress         Objective:   Physical Exam  Constitutional: She appears well-developed and well-nourished. No distress.  obese and well  appearing   HENT:  Head: Normocephalic and atraumatic.  Eyes: Conjunctivae and EOM are normal. Pupils are equal, round, and reactive to light. No scleral icterus.  Neck: Normal range of motion. Neck supple.  Cardiovascular: Normal rate and regular rhythm.   Pulmonary/Chest: Effort normal and breath sounds normal.  She has no wheezes. She has no rales.  Musculoskeletal: She exhibits no edema.  Lymphadenopathy:    She has no cervical adenopathy.  Neurological: She is alert. She has normal reflexes. No cranial nerve deficit. She exhibits normal muscle tone. Coordination normal.  Skin: Skin is warm and dry. No rash noted. No erythema.  Psychiatric: Her speech is normal and behavior is normal. Thought content normal. Her mood appears anxious. Her affect is not blunt and not labile. She exhibits a depressed mood.  occ tearful  Is able to share her stressors easily          Assessment & Plan:

## 2013-10-17 NOTE — Assessment & Plan Note (Signed)
Am glucose rev- improved  Urged to start checking some PP sugars - and keeping track of diet  Rev low glycemic diet and need for wt loss  Lab and f/u 3 mo

## 2013-10-17 NOTE — Assessment & Plan Note (Signed)
Enormous caregiver stress - with some mood change  Reviewed stressors/ coping techniques/symptoms/ support sources/ tx options and side effects in detail today  Disc investigating counseling opt through hospice or we can refer  Trial of prozac 10 -titrate to 20 = rev side eff incl worse symptoms or SI in detail  F/u planned

## 2013-10-18 ENCOUNTER — Telehealth: Payer: Self-pay

## 2013-10-18 NOTE — Telephone Encounter (Signed)
Relevant patient education assigned to patient using Emmi. ° °

## 2013-11-16 ENCOUNTER — Encounter: Payer: Self-pay | Admitting: Family Medicine

## 2013-11-30 ENCOUNTER — Other Ambulatory Visit: Payer: Self-pay | Admitting: Family Medicine

## 2014-01-18 ENCOUNTER — Other Ambulatory Visit: Payer: Self-pay | Admitting: Family Medicine

## 2014-01-18 NOTE — Telephone Encounter (Signed)
Electronic refill request, please advise  

## 2014-01-19 ENCOUNTER — Telehealth: Payer: Self-pay | Admitting: Family Medicine

## 2014-01-19 DIAGNOSIS — E119 Type 2 diabetes mellitus without complications: Secondary | ICD-10-CM

## 2014-01-19 DIAGNOSIS — Z0000 Encounter for general adult medical examination without abnormal findings: Secondary | ICD-10-CM

## 2014-01-19 DIAGNOSIS — I10 Essential (primary) hypertension: Secondary | ICD-10-CM

## 2014-01-19 NOTE — Telephone Encounter (Signed)
Message copied by Abner Greenspan on Sun Jan 19, 2014  8:57 PM ------      Message from: Ellamae Sia      Created: Wed Jan 15, 2014  3:55 PM      Regarding: Lab orders for Monday,5.18.15       Lab orders for a 3 month f/u ------

## 2014-01-19 NOTE — Telephone Encounter (Signed)
Will refill electronically  

## 2014-01-20 ENCOUNTER — Other Ambulatory Visit (INDEPENDENT_AMBULATORY_CARE_PROVIDER_SITE_OTHER): Payer: BC Managed Care – PPO

## 2014-01-20 DIAGNOSIS — Z0000 Encounter for general adult medical examination without abnormal findings: Secondary | ICD-10-CM

## 2014-01-20 DIAGNOSIS — E119 Type 2 diabetes mellitus without complications: Secondary | ICD-10-CM

## 2014-01-20 LAB — CBC WITH DIFFERENTIAL/PLATELET
BASOS ABS: 0 10*3/uL (ref 0.0–0.1)
Basophils Relative: 0.4 % (ref 0.0–3.0)
EOS PCT: 1.6 % (ref 0.0–5.0)
Eosinophils Absolute: 0.1 10*3/uL (ref 0.0–0.7)
HCT: 34.8 % — ABNORMAL LOW (ref 36.0–46.0)
Hemoglobin: 11.2 g/dL — ABNORMAL LOW (ref 12.0–15.0)
LYMPHS PCT: 29 % (ref 12.0–46.0)
Lymphs Abs: 1.6 10*3/uL (ref 0.7–4.0)
MCHC: 32.3 g/dL (ref 30.0–36.0)
MCV: 76.9 fl — ABNORMAL LOW (ref 78.0–100.0)
MONOS PCT: 7.4 % (ref 3.0–12.0)
Monocytes Absolute: 0.4 10*3/uL (ref 0.1–1.0)
NEUTROS PCT: 61.6 % (ref 43.0–77.0)
Neutro Abs: 3.4 10*3/uL (ref 1.4–7.7)
PLATELETS: 318 10*3/uL (ref 150.0–400.0)
RBC: 4.52 Mil/uL (ref 3.87–5.11)
RDW: 15.1 % (ref 11.5–15.5)
WBC: 5.6 10*3/uL (ref 4.0–10.5)

## 2014-01-20 LAB — LIPID PANEL
Cholesterol: 151 mg/dL (ref 0–200)
HDL: 41 mg/dL (ref >39.00)
LDL Cholesterol: 77 mg/dL (ref 0–99)
Total CHOL/HDL Ratio: 4
Triglycerides: 163 mg/dL — ABNORMAL HIGH (ref 0.0–149.0)
VLDL: 32.6 mg/dL (ref 0.0–40.0)

## 2014-01-20 LAB — COMPREHENSIVE METABOLIC PANEL
ALBUMIN: 3.8 g/dL (ref 3.5–5.2)
ALT: 15 U/L (ref 0–35)
AST: 19 U/L (ref 0–37)
Alkaline Phosphatase: 44 U/L (ref 39–117)
BUN: 15 mg/dL (ref 6–23)
CALCIUM: 9.2 mg/dL (ref 8.4–10.5)
CHLORIDE: 104 meq/L (ref 96–112)
CO2: 29 meq/L (ref 19–32)
Creatinine, Ser: 0.7 mg/dL (ref 0.4–1.2)
GFR: 88.38 mL/min (ref >60.00)
GLUCOSE: 126 mg/dL — AB (ref 70–99)
POTASSIUM: 3.6 meq/L (ref 3.5–5.1)
SODIUM: 140 meq/L (ref 135–145)
TOTAL PROTEIN: 6.7 g/dL (ref 6.0–8.3)
Total Bilirubin: 0.7 mg/dL (ref 0.2–1.2)

## 2014-01-20 LAB — HEMOGLOBIN A1C: HEMOGLOBIN A1C: 6.5 % (ref 4.6–6.5)

## 2014-01-20 LAB — TSH: TSH: 0.26 u[IU]/mL — AB (ref 0.35–4.50)

## 2014-01-22 ENCOUNTER — Encounter: Payer: Self-pay | Admitting: Family Medicine

## 2014-01-22 ENCOUNTER — Telehealth: Payer: Self-pay

## 2014-01-22 ENCOUNTER — Ambulatory Visit (INDEPENDENT_AMBULATORY_CARE_PROVIDER_SITE_OTHER): Payer: BC Managed Care – PPO | Admitting: Family Medicine

## 2014-01-22 VITALS — BP 124/68 | HR 84 | Temp 98.3°F | Ht 64.25 in | Wt 209.8 lb

## 2014-01-22 DIAGNOSIS — F4321 Adjustment disorder with depressed mood: Secondary | ICD-10-CM

## 2014-01-22 DIAGNOSIS — I10 Essential (primary) hypertension: Secondary | ICD-10-CM

## 2014-01-22 DIAGNOSIS — E039 Hypothyroidism, unspecified: Secondary | ICD-10-CM

## 2014-01-22 DIAGNOSIS — E669 Obesity, unspecified: Secondary | ICD-10-CM

## 2014-01-22 DIAGNOSIS — E119 Type 2 diabetes mellitus without complications: Secondary | ICD-10-CM

## 2014-01-22 DIAGNOSIS — E785 Hyperlipidemia, unspecified: Secondary | ICD-10-CM

## 2014-01-22 MED ORDER — SYNTHROID 50 MCG PO TABS: ORAL_TABLET | ORAL | Status: DC

## 2014-01-22 NOTE — Telephone Encounter (Signed)
[place] left v/m requesting clarification of Synthroid 50 mcg instructions; instructions read take 1/2 tablet in parenthesis has 50 mcg (50 mcg) daily. Please advise.

## 2014-01-22 NOTE — Progress Notes (Signed)
Pre visit review using our clinic review tool, if applicable. No additional management support is needed unless otherwise documented below in the visit note. 

## 2014-01-22 NOTE — Progress Notes (Signed)
Subjective:    Patient ID: Brooke Mitchell, female    DOB: February 20, 1951, 63 y.o.   MRN: [id number]  HPI Here for f/u of chronic medical problems   Lost her husband to ALS - and had counseling through hospice  She is getting used to her "new normal" now and gradually improved   Wt  is down 5 lb with bmi of 35  Is eating a little better but apetite is also low   Lab Results  Component Value Date   HGBA1C 6.5 01/20/2014   down from 7.3  This is a decent improvement  Watching what she eats  Glucose readings are good  opthy exam last week-small cataracts and watches for glaucoma   bp is stable today  No cp or palpitations or headaches or edema  No side effects to medicines  BP Readings from Last 3 Encounters:  01/22/14 124/68  10/16/13 110/72  07/15/13 124/76      Lab Results  Component Value Date   CHOL 151 01/20/2014   CHOL 171 07/08/2013   CHOL 156 10/08/2012   Lab Results  Component Value Date   HDL 41.00 01/20/2014   HDL 46.10 07/08/2013   HDL 42.30 10/08/2012   Lab Results  Component Value Date   LDLCALC 77 01/20/2014   LDLCALC 98 07/08/2013   LDLCALC 82 10/08/2012   Lab Results  Component Value Date   TRIG 163.0* 01/20/2014   TRIG 137.0 07/08/2013   TRIG 161.0* 10/08/2012   Lab Results  Component Value Date   CHOLHDL 4 01/20/2014   CHOLHDL 4 07/08/2013   CHOLHDL 4 10/08/2012   Lab Results  Component Value Date   LDLDIRECT 78.4 03/16/2010   LDLDIRECT 92.3 04/02/2008   LDLDIRECT 80.6 12/01/2006   overall a little better   tsh is low again  Lab Results  Component Value Date   TSH 0.26* 01/20/2014   she is on synthroid 50 mcg  No lump or goiter  Is jittery at times   Patient Active Problem List   Diagnosis Date Noted  . Grief reaction 01/22/2014  . Stress reaction 10/16/2013  . Left leg pain 04/22/2013  . Viral gastroenteritis 02/04/2013  . Colon cancer screening 10/24/2012  . Anemia 10/24/2012  . Abdominal pain, RUQ 10/24/2012  . Trochanteric bursitis of  left hip 11/14/2011  . Obesity 10/19/2011  . Other screening mammogram 04/13/2011  . Routine general medical examination at a health care facility 04/07/2011  . UTI'S, RECURRENT 02/03/2010  . POSTMENOPAUSAL STATUS 03/28/2008  . HYPOTHYROIDISM 11/24/2006  . DIABETES MELLITUS, TYPE II 11/24/2006  . HYPERLIPIDEMIA 11/24/2006  . RESTLESS LEG SYNDROME 11/24/2006  . HYPERTENSION 11/24/2006  . PREMATURE VENTRICULAR CONTRACTIONS 11/24/2006  . HEMORRHOIDS 11/24/2006  . ALLERGIC RHINITIS 11/24/2006  . ASTHMA 11/24/2006  . GERD 11/24/2006  . HIATAL HERNIA 11/24/2006  . OVERACTIVE BLADDER 11/24/2006  . [place] DISEASE, CERVICAL 11/24/2006  . SLEEP APNEA, SEVERE 11/24/2006   Past Medical History  Diagnosis Date  . Allergic rhinitis   . Asthma     As a child   . GERD (gastroesophageal reflux disease)   . Hyperlipidemia   . Hypertension   . Hypothyroidism   . Diabetes mellitus     Type II (2/06 elevated microalbumin)  . Obesity   . OSA (obstructive sleep apnea)     CPAP  . Frequent UTI     with coital prohylaxis   . Anemia    Past Surgical History  Procedure Laterality Date  .  Foot surgery    . Endometrial biopsy    . Dilation and curettage of uterus     History  Substance Use Topics  . Smoking status: Never Smoker   . Smokeless tobacco: Never Used  . Alcohol Use: Yes     Comment: occasional   Family History  Problem Relation Age of Onset  . Diabetes Mother   . Coronary artery disease Mother   . Hypothyroidism Mother   . Irritable bowel syndrome Mother   . Alzheimer's disease Father   . Nephrolithiasis Father   . Hypothyroidism Brother   . Colon cancer Neg Hx    No Known Allergies Current Outpatient Prescriptions on File Prior to Visit  Medication Sig Dispense Refill  . acetaminophen (TYLENOL) 325 MG tablet Take 650 mg by mouth as needed for pain.      Marland Kitchen ADVAIR DISKUS 100-50 MCG/DOSE AEPB USE 1 PUFF TWICE DAILY DURING ALLERGY SEASON  60 each  10  . albuterol  (PROVENTIL,VENTOLIN) 90 MCG/ACT inhaler Inhale 2 puffs into the lungs every 4 (four) hours as needed.        Marland Kitchen aspirin 81 MG tablet Take 81 mg by mouth daily.        Marland Kitchen atorvastatin (LIPITOR) 20 MG tablet TAKE 1/2 TABLET (10 MG TOTAL) BY MOUTH DAILY.  15 tablet  5  . calcium carbonate (CALCIUM 600) 600 MG TABS Take 600 mg by mouth 2 (two) times daily with a meal.        . FLUoxetine (PROZAC) 20 MG capsule TAKE 1 TABLET (20 MG TOTAL) BY MOUTH DAILY.  30 capsule  5  . fosinopril-hydrochlorothiazide (MONOPRIL-HCT) 10-12.5 MG per tablet Take 1 tablet by mouth daily.  30 tablet  2  . GLIPIZIDE XL 10 MG 24 hr tablet TAKE 1 TABLET (10 MG TOTAL) BY MOUTH DAILY.  30 tablet  5  . HYDROcodone-acetaminophen (VICODIN) 5-500 MG per tablet Take 1 tablet by mouth every 6 (six) hours as needed for pain. 1/2 to 1 tab by mouth three times a day as needed for pain  30 tablet  0  . hyoscyamine (LEVSIN SL) 0.125 MG SL tablet Place one tablet under tongue once a day as needed.  30 tablet  1  . loratadine (CLARITIN) 10 MG tablet Take 10 mg by mouth daily.        . meloxicam (MOBIC) 15 MG tablet TAKE 1 TABLET BY MOUTH DAILY AS NEEDED WITH FOOD  90 tablet  1  . metaxalone (SKELAXIN) 800 MG tablet TAKE 1 TABLET  BY MOUTH 3  TIMES DAILY. WITH CAUTION OF SEDATION  60 tablet  0  . metFORMIN (GLUCOPHAGE) 1000 MG tablet TAKE 1 TABLET (1,000 MG TOTAL) BY MOUTH 2 (TWO) TIMES DAILY WITH A MEAL.  60 tablet  5  . multivitamin (THERAGRAN) per tablet Take 1 tablet by mouth daily.        Marland Kitchen omeprazole (PRILOSEC OTC) 20 MG tablet Take 20 mg by mouth daily.      . ONE TOUCH ULTRA TEST test strip CHECK SUGAR TWICE A DAY AS INSTRUCTED (DM 250.0) THAT IS NOT OPTIMALLYCONTROLLED  100 each  0  . traMADol (ULTRAM) 50 MG tablet TAKE 1 TABLET (50 MG TOTAL) BY MOUTH EVERY 6 (SIX) HOURS AS NEEDED FOR PAIN.  50 tablet  0   No current facility-administered medications on file prior to visit.    Review of Systems Review of Systems  Constitutional:  Negative for fever, appetite change, fatigue and unexpected weight  change.  Eyes: Negative for pain and visual disturbance.  Respiratory: Negative for cough and shortness of breath.   Cardiovascular: Negative for cp or palpitations    Gastrointestinal: Negative for nausea, diarrhea and constipation.  Genitourinary: Negative for urgency and frequency.  Skin: Negative for pallor or rash   Neurological: Negative for weakness, light-headedness, numbness and headaches.  Hematological: Negative for adenopathy. Does not bruise/bleed easily.  Psychiatric/Behavioral: pos for grief rxn and also some anxiety-which she feels she can handle well        Objective:   Physical Exam  Constitutional: She appears well-developed and well-nourished. No distress.  obese and well appearing   HENT:  Head: Normocephalic and atraumatic.  Left Ear: External ear normal.  Mouth/Throat: Oropharynx is clear and moist.  Eyes: Conjunctivae and EOM are normal. Pupils are equal, round, and reactive to light. No scleral icterus.  Neck: Normal range of motion. Neck supple. No JVD present. Carotid bruit is not present. No thyromegaly present.  Cardiovascular: Normal rate, regular rhythm, normal heart sounds and intact distal pulses.  Exam reveals no gallop.   Pulmonary/Chest: Effort normal and breath sounds normal. No respiratory distress. She has no wheezes. She exhibits no tenderness.  Abdominal: Soft. Bowel sounds are normal. She exhibits no distension, no abdominal bruit and no mass. There is no tenderness.  Musculoskeletal: Normal range of motion. She exhibits no edema and no tenderness.  Lymphadenopathy:    She has no cervical adenopathy.  Neurological: She is alert. She has normal reflexes. No cranial nerve deficit. She exhibits normal muscle tone. Coordination normal.  Skin: Skin is warm and dry. No rash noted. No erythema. No pallor.  Psychiatric: She has a normal mood and affect.  Not tearful            Assessment & Plan:

## 2014-01-22 NOTE — Telephone Encounter (Signed)
She needs to take 1/2 of a 65 which is 71 I will re send it  Thanks

## 2014-01-22 NOTE — Patient Instructions (Signed)
Glucose control is improved as is weight and cholesterol Keep taking care of yourself Cut thyroid pill in half and take 1/2 pill daily  Schedule non fasting labs in 3 months for a1c and thyroid test Follow up in 6 months

## 2014-01-23 NOTE — Assessment & Plan Note (Signed)
Dec dose to 25 mcg for low tsh Lab Results  Component Value Date   TSH 0.26* 01/20/2014    Some jitteriness - ? If from this or grief  Re check planned

## 2014-01-23 NOTE — Assessment & Plan Note (Signed)
Doing well overall after loosing husband to ALS Reviewed stressors/ coping techniques/symptoms/ support sources/ tx options and side effects in detail today  Has had hospice counseling and has good support  Disc good and bad days  Overall does not think she needs extra help at this time

## 2014-01-23 NOTE — Assessment & Plan Note (Signed)
Lab Results  Component Value Date   HGBA1C 6.5 01/20/2014   Eating better  Working on wt loss -has time to care for herself now  Lab in 3 mo

## 2014-01-23 NOTE — Assessment & Plan Note (Signed)
Disc goals for lipids and reasons to control them Rev labs with pt Rev low sat fat diet in detail   

## 2014-01-23 NOTE — Assessment & Plan Note (Signed)
Discussed how this problem influences overall health and the risks it imposes  Reviewed plan for weight loss with lower calorie diet (via better food choices and also portion control or program like weight watchers) and exercise building up to or more than 30 minutes 5 days per week including some aerobic activity    

## 2014-01-23 NOTE — Assessment & Plan Note (Signed)
bp in fair control at this time  BP Readings from Last 1 Encounters:  01/22/14 124/68   No changes needed Disc lifstyle change with low sodium diet and exercise   Lab rev

## 2014-01-27 ENCOUNTER — Other Ambulatory Visit: Payer: Self-pay | Admitting: Family Medicine

## 2014-02-20 ENCOUNTER — Other Ambulatory Visit: Payer: Self-pay | Admitting: Family Medicine

## 2014-02-27 ENCOUNTER — Other Ambulatory Visit: Payer: Self-pay | Admitting: Family Medicine

## 2014-03-21 ENCOUNTER — Other Ambulatory Visit: Payer: Self-pay | Admitting: Family Medicine

## 2014-04-21 ENCOUNTER — Other Ambulatory Visit: Payer: Self-pay | Admitting: Family Medicine

## 2014-04-21 NOTE — Telephone Encounter (Signed)
Electronic refill request, please advise  

## 2014-04-21 NOTE — Telephone Encounter (Signed)
done

## 2014-04-21 NOTE — Telephone Encounter (Signed)
Please refill for 6 mo 

## 2014-04-24 ENCOUNTER — Other Ambulatory Visit (INDEPENDENT_AMBULATORY_CARE_PROVIDER_SITE_OTHER): Payer: BC Managed Care – PPO

## 2014-04-24 DIAGNOSIS — E119 Type 2 diabetes mellitus without complications: Secondary | ICD-10-CM

## 2014-04-24 DIAGNOSIS — E039 Hypothyroidism, unspecified: Secondary | ICD-10-CM

## 2014-04-25 LAB — HEMOGLOBIN A1C: Hgb A1c MFr Bld: 6.5 % (ref 4.6–6.5)

## 2014-04-25 LAB — TSH: TSH: 1.25 u[IU]/mL (ref 0.35–4.50)

## 2014-04-28 ENCOUNTER — Encounter: Payer: Self-pay | Admitting: *Deleted

## 2014-05-30 ENCOUNTER — Other Ambulatory Visit: Payer: Self-pay | Admitting: Family Medicine

## 2014-05-30 ENCOUNTER — Encounter: Payer: Self-pay | Admitting: Family Medicine

## 2014-05-30 ENCOUNTER — Ambulatory Visit (INDEPENDENT_AMBULATORY_CARE_PROVIDER_SITE_OTHER): Payer: BC Managed Care – PPO | Admitting: Family Medicine

## 2014-05-30 VITALS — BP 128/72 | HR 84 | Temp 98.0°F | Wt 210.5 lb

## 2014-05-30 DIAGNOSIS — L02519 Cutaneous abscess of unspecified hand: Secondary | ICD-10-CM

## 2014-05-30 DIAGNOSIS — L03012 Cellulitis of left finger: Secondary | ICD-10-CM

## 2014-05-30 DIAGNOSIS — L03019 Cellulitis of unspecified finger: Secondary | ICD-10-CM | POA: Insufficient documentation

## 2014-05-30 MED ORDER — DOXYCYCLINE HYCLATE 100 MG PO TABS: 100.0000 mg | ORAL_TABLET | Freq: Two times a day (BID) | ORAL | Status: DC

## 2014-05-30 NOTE — Patient Instructions (Signed)
It was nice to see you. Take doxycyline as directed- 1 tablet twice daily x 10 days.  Take benadryl and or zyrtec for itching.  Apply warm compressed.  On your way out, schedule a follow up in the weekend clinic. You can cancel this if you feel better.

## 2014-05-30 NOTE — Assessment & Plan Note (Signed)
New-  Reassuring that it does not appear to be involving the joint space itself. Will treat with doxycyline 100 mg twice daily x 10 days (she would like to avoid sulfa drugs given that her father is allergic). Advised antihistamines. Draw a line around area of redness and follow up in weekend clinic tomorrow if symptoms NOT improving. She is aware to go to ER over night if she develops fevers, vomiting or other red flag symptoms.

## 2014-05-30 NOTE — Progress Notes (Signed)
Subjective:   Patient ID: Brooke Mitchell, female    DOB: September 16, 1950, 63 y.o.   MRN: [id number]  Brooke Mitchell is a pleasant 63 y.o. year old female pt of Dr. Glori Bickers, new to me who presents to clinic today with Hand Pain  on 05/30/2014  HPI: Was working outside two days ago- spraying bug spray. A lot of ants outside of her house. Immediately felt something bite her 4th finger on left but did not notice any swelling. Later that evening, finger started to itch and throb.  Area with bite mark started to drain clear fluid and finger tight and swollen.  Noticed this am, redness spreading to her hand and her wrist.  No fevers or chills.  Able to move her finger but does feel "tight."  No n/v/d.  Has had localized reactions to bug bites in past.  Current Outpatient Prescriptions on File Prior to Visit  Medication Sig Dispense Refill  . acetaminophen (TYLENOL) 325 MG tablet Take 650 mg by mouth as needed for pain.      Marland Kitchen ADVAIR DISKUS 100-50 MCG/DOSE AEPB USE 1 PUFF TWICE DAILY DURING ALLERGY SEASON  60 each  10  . albuterol (PROVENTIL,VENTOLIN) 90 MCG/ACT inhaler Inhale 2 puffs into the lungs every 4 (four) hours as needed.        Marland Kitchen aspirin 81 MG tablet Take 81 mg by mouth daily.        Marland Kitchen atorvastatin (LIPITOR) 20 MG tablet TAKE 1/2 TABLET (10 MG TOTAL) BY MOUTH DAILY.  15 tablet  5  . calcium carbonate (CALCIUM 600) 600 MG TABS Take 600 mg by mouth 2 (two) times daily with a meal.        . FLUoxetine (PROZAC) 20 MG capsule TAKE 1 TABLET (20 MG TOTAL) BY MOUTH DAILY.  30 capsule  5  . fosinopril-hydrochlorothiazide (MONOPRIL-HCT) 10-12.5 MG per tablet TAKE 1 TABLET BY MOUTH DAILY.  30 tablet  5  . GLIPIZIDE XL 10 MG 24 hr tablet TAKE 1 TABLET (10 MG TOTAL) BY MOUTH DAILY.  30 tablet  3  . HYDROcodone-acetaminophen (VICODIN) 5-500 MG per tablet Take 1 tablet by mouth every 6 (six) hours as needed for pain. 1/2 to 1 tab by mouth three times a day as needed for pain  30 tablet  0  .  hyoscyamine (LEVSIN SL) 0.125 MG SL tablet Place one tablet under tongue once a day as needed.  30 tablet  1  . loratadine (CLARITIN) 10 MG tablet Take 10 mg by mouth daily.        . Melatonin 3 MG TABS Take 1 tablet by mouth at bedtime.      . meloxicam (MOBIC) 15 MG tablet TAKE 1 TABLET BY MOUTH DAILY AS NEEDED WITH FOOD  90 tablet  1  . metaxalone (SKELAXIN) 800 MG tablet TAKE 1 TABLET  BY MOUTH 3  TIMES DAILY. WITH CAUTION OF SEDATION  60 tablet  0  . metFORMIN (GLUCOPHAGE) 1000 MG tablet TAKE 1 TABLET (1,000 MG TOTAL) BY MOUTH 2 (TWO) TIMES DAILY WITH A MEAL.  60 tablet  5  . multivitamin (THERAGRAN) per tablet Take 1 tablet by mouth daily.        Marland Kitchen omeprazole (PRILOSEC OTC) 20 MG tablet Take 20 mg by mouth daily.      . ONE TOUCH ULTRA TEST test strip CHECK SUGAR TWICE A DAY AS INSTRUCTED (DM 250.0) THAT IS NOT OPTIMALLYCONTROLLED  100 each  0  . SYNTHROID 50 MCG  tablet TAKE 1/2  TABLET (25 MCG TOTAL) BY MOUTH DAILY.  15 tablet  5  . traMADol (ULTRAM) 50 MG tablet TAKE 1 TABLET (50 MG TOTAL) BY MOUTH EVERY 6 (SIX) HOURS AS NEEDED FOR PAIN.  50 tablet  0   No current facility-administered medications on file prior to visit.    No Known Allergies  Past Medical History  Diagnosis Date  . Allergic rhinitis   . Asthma     As a child   . GERD (gastroesophageal reflux disease)   . Hyperlipidemia   . Hypertension   . Hypothyroidism   . Diabetes mellitus     Type II (2/06 elevated microalbumin)  . Obesity   . OSA (obstructive sleep apnea)     CPAP  . Frequent UTI     with coital prohylaxis   . Anemia     Past Surgical History  Procedure Laterality Date  . Foot surgery    . Endometrial biopsy    . Dilation and curettage of uterus      Family History  Problem Relation Age of Onset  . Diabetes Mother   . Coronary artery disease Mother   . Hypothyroidism Mother   . Irritable bowel syndrome Mother   . Alzheimer's disease Father   . Nephrolithiasis Father   . Hypothyroidism  Brother   . Colon cancer Neg Hx     History   Social History  . Marital Status: Married    Spouse Name: N/A    Number of Children: 2  . Years of Education: N/A   Occupational History  . Pharmacist, hospital   .     Social History Main Topics  . Smoking status: Never Smoker   . Smokeless tobacco: Never Used  . Alcohol Use: Yes     Comment: occasional  . Drug Use: No  . Sexual Activity: Not on file   Other Topics Concern  . Not on file   Social History Narrative   Some walking for exercise   Widow 11/2013- Cared for husband full time with ALS at home    The PMH, [place], Social History, Family History, Medications, and allergies have been reviewed in [facility], and have been updated if relevant.   Review of Systems No fever No HA No n/v No blurred vision or sensitivity to light    Objective:    BP 128/72  Pulse 84  Temp(Src) 98 F (36.7 C) (Oral)  Wt 210 lb 8 oz (95.482 kg)  SpO2 98%   Physical Exam  Nursing note and vitals reviewed. Constitutional: She appears well-developed and well-nourished. No distress.  HENT:  Head: Normocephalic.  Musculoskeletal:       Hands: Psychiatric: She has a normal mood and affect. Her behavior is normal. Judgment and thought content normal. Cognition and memory are normal.          Assessment & Plan:   Cellulitis of finger of left hand No Follow-up on file.

## 2014-05-30 NOTE — Progress Notes (Signed)
Pre visit review using our clinic review tool, if applicable. No additional management support is needed unless otherwise documented below in the visit note. 

## 2014-05-31 ENCOUNTER — Ambulatory Visit: Payer: BC Managed Care – PPO | Admitting: Family Medicine

## 2014-06-25 ENCOUNTER — Ambulatory Visit (INDEPENDENT_AMBULATORY_CARE_PROVIDER_SITE_OTHER): Payer: BC Managed Care – PPO

## 2014-06-25 DIAGNOSIS — Z23 Encounter for immunization: Secondary | ICD-10-CM

## 2014-07-23 ENCOUNTER — Other Ambulatory Visit: Payer: Self-pay | Admitting: Family Medicine

## 2014-07-23 NOTE — Telephone Encounter (Signed)
Please refill for a mo  She has f/u with me in 2 days

## 2014-07-23 NOTE — Telephone Encounter (Signed)
done

## 2014-07-23 NOTE — Telephone Encounter (Signed)
Electronic refill request, please advise  

## 2014-07-23 NOTE — Telephone Encounter (Signed)
Please refill for a month  She has f/u with me in several days

## 2014-07-25 ENCOUNTER — Encounter: Payer: Self-pay | Admitting: Family Medicine

## 2014-07-25 ENCOUNTER — Encounter (INDEPENDENT_AMBULATORY_CARE_PROVIDER_SITE_OTHER): Payer: Self-pay

## 2014-07-25 ENCOUNTER — Ambulatory Visit (INDEPENDENT_AMBULATORY_CARE_PROVIDER_SITE_OTHER): Payer: BC Managed Care – PPO | Admitting: Family Medicine

## 2014-07-25 VITALS — BP 110/66 | HR 79 | Temp 98.5°F | Ht 64.25 in | Wt 209.0 lb

## 2014-07-25 DIAGNOSIS — E119 Type 2 diabetes mellitus without complications: Secondary | ICD-10-CM

## 2014-07-25 DIAGNOSIS — I10 Essential (primary) hypertension: Secondary | ICD-10-CM

## 2014-07-25 LAB — HEMOGLOBIN A1C: HEMOGLOBIN A1C: 6.5 % (ref 4.6–6.5)

## 2014-07-25 NOTE — Patient Instructions (Signed)
Lab for A1C today I'm glad you are doing well  Schedule annual exam at check out with labs prior - spring/early summer is fine

## 2014-07-25 NOTE — Progress Notes (Signed)
Subjective:    Patient ID: Brooke Mitchell, female    DOB: [birth year], 63 y.o.   MRN: [id number]  HPI Here for f/u of chronic medical problems   Is hosting TG-will cater it at her house   Wt is down 1 lb with bmi of 35  Has good days and bad days for diet and exercise  A lot of walking  Starting to wear a fitbit bracelet - to keep count of her steps   bp is stable today  No cp or palpitations or headaches or edema  No side effects to medicines  BP Readings from Last 3 Encounters:  07/25/14 110/66  05/30/14 128/72  01/22/14 124/68     Diabetes Home sugar results -no change from usual  DM diet - fair Exercise - fair  Symptoms none  A1C last  Lab Results  Component Value Date   HGBA1C 6.5 04/24/2014   Due for re check  No problems with medications  Renal protection ace  Last eye exam 5/15  On lipitor for chol Lab Results  Component Value Date   CHOL 151 01/20/2014   HDL 41.00 01/20/2014   LDLCALC 77 01/20/2014   LDLDIRECT 78.4 03/16/2010   TRIG 163.0* 01/20/2014   CHOLHDL 4 01/20/2014     Hypothyroidism  Pt has no clinical changes No change in energy level/ hair or skin/ edema and no tremor No changes clinically  Lab Results  Component Value Date   TSH 1.25 04/24/2014     Is due for a mammogram   Patient Active Problem List   Diagnosis Date Noted  . Cellulitis of finger 05/30/2014  . Grief reaction 01/22/2014  . Colon cancer screening 10/24/2012  . Anemia 10/24/2012  . Trochanteric bursitis of left hip 11/14/2011  . Obesity 10/19/2011  . Other screening mammogram 04/13/2011  . Routine general medical examination at a health care facility 04/07/2011  . UTI'S, RECURRENT 02/03/2010  . POSTMENOPAUSAL STATUS 03/28/2008  . HYPOTHYROIDISM 11/24/2006  . DIABETES MELLITUS, TYPE II 11/24/2006  . HYPERLIPIDEMIA 11/24/2006  . RESTLESS LEG SYNDROME 11/24/2006  . HYPERTENSION 11/24/2006  . PREMATURE VENTRICULAR CONTRACTIONS 11/24/2006  . HEMORRHOIDS  11/24/2006  . ALLERGIC RHINITIS 11/24/2006  . ASTHMA 11/24/2006  . GERD 11/24/2006  . HIATAL HERNIA 11/24/2006  . OVERACTIVE BLADDER 11/24/2006  . [place] DISEASE, CERVICAL 11/24/2006  . SLEEP APNEA, SEVERE 11/24/2006   Past Medical History  Diagnosis Date  . Allergic rhinitis   . Asthma     As a child   . GERD (gastroesophageal reflux disease)   . Hyperlipidemia   . Hypertension   . Hypothyroidism   . Diabetes mellitus     Type II (2/06 elevated microalbumin)  . Obesity   . OSA (obstructive sleep apnea)     CPAP  . Frequent UTI     with coital prohylaxis   . Anemia    Past Surgical History  Procedure Laterality Date  . Foot surgery    . Endometrial biopsy    . Dilation and curettage of uterus     History  Substance Use Topics  . Smoking status: Never Smoker   . Smokeless tobacco: Never Used  . Alcohol Use: 0.0 oz/week    0 Not specified per week     Comment: occasional   Family History  Problem Relation Age of Onset  . Diabetes Mother   . Coronary artery disease Mother   . Hypothyroidism Mother   . Irritable bowel syndrome Mother   .  Alzheimer's disease Father   . Nephrolithiasis Father   . Hypothyroidism Brother   . Colon cancer Neg Hx    No Known Allergies Current Outpatient Prescriptions on File Prior to Visit  Medication Sig Dispense Refill  . acetaminophen (TYLENOL) 325 MG tablet Take 650 mg by mouth as needed for pain.    Marland Kitchen ADVAIR DISKUS 100-50 MCG/DOSE AEPB USE 1 PUFF TWICE DAILY DURING ALLERGY SEASON 60 each 1  . albuterol (PROVENTIL,VENTOLIN) 90 MCG/ACT inhaler Inhale 2 puffs into the lungs every 4 (four) hours as needed.      Marland Kitchen aspirin 81 MG tablet Take 81 mg by mouth daily.      Marland Kitchen atorvastatin (LIPITOR) 20 MG tablet TAKE 1/2 TABLET (10 MG TOTAL) BY MOUTH DAILY. 15 tablet 5  . calcium carbonate (CALCIUM 600) 600 MG TABS Take 600 mg by mouth 2 (two) times daily with a meal.      . FLUoxetine (PROZAC) 20 MG capsule TAKE 1 CAPSULE BY MOUTH DAILY. 30  capsule 0  . fosinopril-hydrochlorothiazide (MONOPRIL-HCT) 10-12.5 MG per tablet TAKE 1 TABLET BY MOUTH DAILY. 30 tablet 5  . GLIPIZIDE XL 10 MG 24 hr tablet TAKE 1 TABLET (10 MG TOTAL) BY MOUTH DAILY. 30 tablet 0  . HYDROcodone-acetaminophen (VICODIN) 5-500 MG per tablet Take 1 tablet by mouth every 6 (six) hours as needed for pain. 1/2 to 1 tab by mouth three times a day as needed for pain 30 tablet 0  . hyoscyamine (LEVSIN SL) 0.125 MG SL tablet Place one tablet under tongue once a day as needed. 30 tablet 1  . loratadine (CLARITIN) 10 MG tablet Take 10 mg by mouth daily.      . Melatonin 3 MG TABS Take 1 tablet by mouth at bedtime.    . meloxicam (MOBIC) 15 MG tablet TAKE 1 TABLET BY MOUTH DAILY AS NEEDED WITH FOOD 90 tablet 1  . metaxalone (SKELAXIN) 800 MG tablet TAKE 1 TABLET  BY MOUTH 3  TIMES DAILY. WITH CAUTION OF SEDATION 60 tablet 0  . metFORMIN (GLUCOPHAGE) 1000 MG tablet TAKE 1 TABLET (1,000 MG TOTAL) BY MOUTH 2 (TWO) TIMES DAILY WITH A MEAL. 60 tablet 5  . multivitamin (THERAGRAN) per tablet Take 1 tablet by mouth daily.      Marland Kitchen omeprazole (PRILOSEC OTC) 20 MG tablet Take 20 mg by mouth daily.    . ONE TOUCH ULTRA TEST test strip CHECK SUGAR TWICE A DAY AS INSTRUCTED (DM 250.0) THAT IS NOT OPTIMALLYCONTROLLED 100 each 0  . PROAIR HFA 108 (90 BASE) MCG/ACT inhaler USE 2 PUFFS UPTO EVERY 4 HOURS AS NEEDED FOR WHEEZING 8.5 g 1  . SYNTHROID 50 MCG tablet TAKE 1/2  TABLET (25 MCG TOTAL) BY MOUTH DAILY. 15 tablet 5  . traMADol (ULTRAM) 50 MG tablet TAKE 1 TABLET (50 MG TOTAL) BY MOUTH EVERY 6 (SIX) HOURS AS NEEDED FOR PAIN. 50 tablet 0   No current facility-administered medications on file prior to visit.       Review of Systems Review of Systems  Constitutional: Negative for fever, appetite change, fatigue and unexpected weight change.  Eyes: Negative for pain and visual disturbance.  Respiratory: Negative for cough and shortness of breath.   Cardiovascular: Negative for cp or  palpitations    Gastrointestinal: Negative for nausea, diarrhea and constipation.  Genitourinary: Negative for urgency and frequency.  Skin: Negative for pallor or rash   Neurological: Negative for weakness, light-headedness, numbness and headaches.  Hematological: Negative for adenopathy. Does not  bruise/bleed easily.  Psychiatric/Behavioral: Negative for dysphoric mood. The patient is not nervous/anxious.         Objective:   Physical Exam  Constitutional: She appears well-developed and well-nourished. No distress.  obese and well appearing   HENT:  Head: Normocephalic and atraumatic.  Mouth/Throat: Oropharynx is clear and moist.  Eyes: Conjunctivae and EOM are normal. Pupils are equal, round, and reactive to light. No scleral icterus.  Neck: Normal range of motion. Neck supple. No JVD present. Carotid bruit is not present. No thyromegaly present.  Cardiovascular: Normal rate, regular rhythm and intact distal pulses.  Exam reveals no gallop.   No murmur heard. Pulmonary/Chest: Effort normal and breath sounds normal. No respiratory distress. She has no wheezes. She has no rales.  Abdominal: Soft. Bowel sounds are normal. There is no tenderness.  Musculoskeletal: She exhibits no edema.  Lymphadenopathy:    She has no cervical adenopathy.  Neurological: She is alert. She has normal reflexes.  Skin: Skin is warm and dry. No rash noted.  Psychiatric: She has a normal mood and affect.          Assessment & Plan:   Problem List Items Addressed This Visit      Cardiovascular and Mediastinum   Essential hypertension - Primary    bp in fair control at this time  BP Readings from Last 1 Encounters:  07/25/14 110/66   No changes needed Disc lifstyle change with low sodium diet and exercise        Endocrine   Diabetes type 2, controlled    A1C today  Her home readings have been good/ stable  Better health habits-enc this and weight loss  Rev low glycemic diet      Relevant Orders      Hemoglobin A1c (Completed)

## 2014-07-25 NOTE — Progress Notes (Signed)
Pre visit review using our clinic review tool, if applicable. No additional management support is needed unless otherwise documented below in the visit note. 

## 2014-07-27 NOTE — Assessment & Plan Note (Signed)
A1C today  Her home readings have been good/ stable  Better health habits-enc this and weight loss  Rev low glycemic diet

## 2014-07-27 NOTE — Assessment & Plan Note (Signed)
bp in fair control at this time  BP Readings from Last 1 Encounters:  07/25/14 110/66   No changes needed Disc lifstyle change with low sodium diet and exercise

## 2014-07-28 ENCOUNTER — Encounter: Payer: Self-pay | Admitting: *Deleted

## 2014-07-29 ENCOUNTER — Other Ambulatory Visit: Payer: Self-pay | Admitting: Family Medicine

## 2014-08-05 ENCOUNTER — Other Ambulatory Visit: Payer: Self-pay | Admitting: Family Medicine

## 2014-08-13 ENCOUNTER — Other Ambulatory Visit: Payer: Self-pay | Admitting: Family Medicine

## 2014-08-23 ENCOUNTER — Other Ambulatory Visit: Payer: Self-pay | Admitting: Family Medicine

## 2014-08-26 ENCOUNTER — Encounter: Payer: Self-pay | Admitting: Family Medicine

## 2014-08-26 ENCOUNTER — Ambulatory Visit (INDEPENDENT_AMBULATORY_CARE_PROVIDER_SITE_OTHER): Payer: BC Managed Care – PPO | Admitting: Family Medicine

## 2014-08-26 VITALS — BP 128/76 | HR 93 | Temp 98.6°F | Ht 64.25 in | Wt 209.5 lb

## 2014-08-26 DIAGNOSIS — J069 Acute upper respiratory infection, unspecified: Secondary | ICD-10-CM

## 2014-08-26 DIAGNOSIS — B9789 Other viral agents as the cause of diseases classified elsewhere: Principal | ICD-10-CM

## 2014-08-26 MED ORDER — HYDROCODONE-HOMATROPINE 5-1.5 MG/5ML PO SYRP: 5.0000 mL | ORAL_SOLUTION | Freq: Three times a day (TID) | ORAL | Status: DC | PRN

## 2014-08-26 NOTE — Assessment & Plan Note (Signed)
With laryngitis  Disc symptomatic care - see instructions on AVS  Hycodan px for cough Pt has rescue inhaler if needed (no wheezing now)  Update if not starting to improve in a week or if worsening

## 2014-08-26 NOTE — Progress Notes (Signed)
Subjective:    Patient ID: Brooke Mitchell, female    DOB: 12-Apr-1951, 63 y.o.   MRN: [id number]  HPI Here with uri symptoms   Started Monday (yesterday)  Congested and hoarse voice  Was exp to a lot of smoke on Sunday (thinks she was already getting sick) Raw throat  Not a lot of drainage  Nose is not congested   Cough - is non prod  A little rattling   No fever   Patient Active Problem List   Diagnosis Date Noted  . Cellulitis of finger 05/30/2014  . Grief reaction 01/22/2014  . Colon cancer screening 10/24/2012  . Anemia 10/24/2012  . Trochanteric bursitis of left hip 11/14/2011  . Obesity 10/19/2011  . Other screening mammogram 04/13/2011  . Routine general medical examination at a health care facility 04/07/2011  . UTI'S, RECURRENT 02/03/2010  . POSTMENOPAUSAL STATUS 03/28/2008  . HYPOTHYROIDISM 11/24/2006  . Diabetes type 2, controlled 11/24/2006  . HYPERLIPIDEMIA 11/24/2006  . RESTLESS LEG SYNDROME 11/24/2006  . Essential hypertension 11/24/2006  . PREMATURE VENTRICULAR CONTRACTIONS 11/24/2006  . HEMORRHOIDS 11/24/2006  . ALLERGIC RHINITIS 11/24/2006  . ASTHMA 11/24/2006  . GERD 11/24/2006  . HIATAL HERNIA 11/24/2006  . OVERACTIVE BLADDER 11/24/2006  . Robinson DISEASE, CERVICAL 11/24/2006  . SLEEP APNEA, SEVERE 11/24/2006   Past Medical History  Diagnosis Date  . Allergic rhinitis   . Asthma     As a child   . GERD (gastroesophageal reflux disease)   . Hyperlipidemia   . Hypertension   . Hypothyroidism   . Diabetes mellitus     Type II (2/06 elevated microalbumin)  . Obesity   . OSA (obstructive sleep apnea)     CPAP  . Frequent UTI     with coital prohylaxis   . Anemia    Past Surgical History  Procedure Laterality Date  . Foot surgery    . Endometrial biopsy    . Dilation and curettage of uterus     History  Substance Use Topics  . Smoking status: Never Smoker   . Smokeless tobacco: Never Used  . Alcohol Use: 0.0 oz/week    0 Not  specified per week     Comment: occasional   Family History  Problem Relation Age of Onset  . Diabetes Mother   . Coronary artery disease Mother   . Hypothyroidism Mother   . Irritable bowel syndrome Mother   . Alzheimer's disease Father   . Nephrolithiasis Father   . Hypothyroidism Brother   . Colon cancer Neg Hx    No Known Allergies Current Outpatient Prescriptions on File Prior to Visit  Medication Sig Dispense Refill  . acetaminophen (TYLENOL) 325 MG tablet Take 650 mg by mouth as needed for pain.    Marland Kitchen ADVAIR DISKUS 100-50 MCG/DOSE AEPB USE 1 PUFF TWICE DAILY DURING ALLERGY SEASON 60 each 1  . albuterol (PROVENTIL,VENTOLIN) 90 MCG/ACT inhaler Inhale 2 puffs into the lungs every 4 (four) hours as needed.      Marland Kitchen aspirin 81 MG tablet Take 81 mg by mouth daily.      Marland Kitchen atorvastatin (LIPITOR) 20 MG tablet TAKE 1/2 TABLET (10 MG TOTAL) BY MOUTH DAILY. 15 tablet 5  . calcium carbonate (CALCIUM 600) 600 MG TABS Take 600 mg by mouth 2 (two) times daily with a meal.      . FLUoxetine (PROZAC) 20 MG capsule TAKE 1 CAPSULE BY MOUTH DAILY. 30 capsule 0  . fosinopril-hydrochlorothiazide (MONOPRIL-HCT) 10-12.5 MG per  tablet TAKE 1 TABLET BY MOUTH DAILY. 30 tablet 5  . GLIPIZIDE XL 10 MG 24 hr tablet TAKE 1 TABLET (10 MG TOTAL) BY MOUTH DAILY. 30 tablet 0  . HYDROcodone-acetaminophen (VICODIN) 5-500 MG per tablet Take 1 tablet by mouth every 6 (six) hours as needed for pain. 1/2 to 1 tab by mouth three times a day as needed for pain 30 tablet 0  . hyoscyamine (LEVSIN SL) 0.125 MG SL tablet Place one tablet under tongue once a day as needed. 30 tablet 1  . loratadine (CLARITIN) 10 MG tablet Take 10 mg by mouth daily.      . Melatonin 3 MG TABS Take 1 tablet by mouth at bedtime.    . meloxicam (MOBIC) 15 MG tablet TAKE 1 TABLET BY MOUTH DAILY AS NEEDED WITH FOOD 90 tablet 1  . metaxalone (SKELAXIN) 800 MG tablet TAKE 1 TABLET  BY MOUTH 3  TIMES DAILY. WITH CAUTION OF SEDATION 60 tablet 0  .  metFORMIN (GLUCOPHAGE) 1000 MG tablet TAKE 1 TABLET (1,000 MG TOTAL) BY MOUTH 2 (TWO) TIMES DAILY WITH A MEAL. 60 tablet 5  . multivitamin (THERAGRAN) per tablet Take 1 tablet by mouth daily.      Marland Kitchen omeprazole (PRILOSEC OTC) 20 MG tablet Take 20 mg by mouth daily.    . ONE TOUCH ULTRA TEST test strip CHECK SUGAR TWICE A DAY AS INSTRUCTED (DM 250.0) THAT IS NOT OPTIMALLYCONTROLLED 100 each 0  . PROAIR HFA 108 (90 BASE) MCG/ACT inhaler USE 2 PUFFS UPTO EVERY 4 HOURS AS NEEDED FOR WHEEZING 8.5 g 1  . SYNTHROID 50 MCG tablet TAKE 1 TABLET (50 MCG TOTAL) BY MOUTH DAILY. 30 tablet 5  . traMADol (ULTRAM) 50 MG tablet TAKE 1 TABLET (50 MG TOTAL) BY MOUTH EVERY 6 (SIX) HOURS AS NEEDED FOR PAIN. 50 tablet 0   No current facility-administered medications on file prior to visit.      Review of Systems Review of Systems  Constitutional: Negative for fever, appetite change,  and unexpected weight change. pos for malaise ENT pos for congestion and rhinorrhea and mild ST Eyes: Negative for pain and visual disturbance.  Respiratory: Negative for wheeze and shortness of breath.  pos for hoarseness  Cardiovascular: Negative for cp or palpitations    Gastrointestinal: Negative for nausea, diarrhea and constipation.  Genitourinary: Negative for urgency and frequency.  Skin: Negative for pallor or rash   Neurological: Negative for weakness, light-headedness, numbness and headaches.  Hematological: Negative for adenopathy. Does not bruise/bleed easily.  Psychiatric/Behavioral: Negative for dysphoric mood. The patient is not nervous/anxious.         Objective:   Physical Exam  Constitutional: She appears well-developed and well-nourished. No distress.  Obese and fatigued appearing   HENT:  Head: Normocephalic and atraumatic.  Right Ear: External ear normal.  Left Ear: External ear normal.  Mouth/Throat: Oropharynx is clear and moist. No oropharyngeal exudate.  Nares are injected and congested  Clear  rhinorrhea and drainage No sinus tenderness   Hoarse voice   Eyes: Conjunctivae and EOM are normal. Pupils are equal, round, and reactive to light. Right eye exhibits no discharge. Left eye exhibits no discharge.  Neck: Normal range of motion. Neck supple.  Cardiovascular: Regular rhythm and normal heart sounds.   Pulmonary/Chest: Effort normal and breath sounds normal. No respiratory distress. She has no wheezes. She has no rales.  Lymphadenopathy:    She has no cervical adenopathy.  Neurological: She is alert.  Skin: Skin is  warm and dry. No rash noted.  Psychiatric: She has a normal mood and affect.          Assessment & Plan:   Problem List Items Addressed This Visit      Respiratory   Viral URI with cough - Primary    With laryngitis  Disc symptomatic care - see instructions on AVS  Hycodan px for cough Pt has rescue inhaler if needed (no wheezing now)  Update if not starting to improve in a week or if worsening

## 2014-08-26 NOTE — Patient Instructions (Signed)
You have a viral laryngitis/ head and chest cold  Drink lots of fluids and rest  Hycodan for cough - when not working or driving - will sedate  mucinex to loosen phlegm  Tylenol of fever or achiness   Nasal saline for drainage and congestion    Voice rest is best for hoarseness

## 2014-08-26 NOTE — Progress Notes (Signed)
Pre visit review using our clinic review tool, if applicable. No additional management support is needed unless otherwise documented below in the visit note. 

## 2014-09-15 ENCOUNTER — Ambulatory Visit: Payer: Self-pay | Admitting: Family Medicine

## 2014-09-16 ENCOUNTER — Encounter: Payer: Self-pay | Admitting: Family Medicine

## 2014-09-17 ENCOUNTER — Encounter: Payer: Self-pay | Admitting: *Deleted

## 2014-09-20 ENCOUNTER — Other Ambulatory Visit: Payer: Self-pay | Admitting: Family Medicine

## 2014-11-26 ENCOUNTER — Ambulatory Visit (INDEPENDENT_AMBULATORY_CARE_PROVIDER_SITE_OTHER): Payer: BC Managed Care – PPO | Admitting: Family Medicine

## 2014-11-26 ENCOUNTER — Encounter: Payer: Self-pay | Admitting: Family Medicine

## 2014-11-26 VITALS — BP 138/78 | HR 85 | Temp 98.1°F | Ht 64.25 in | Wt 210.1 lb

## 2014-11-26 DIAGNOSIS — R5382 Chronic fatigue, unspecified: Secondary | ICD-10-CM

## 2014-11-26 DIAGNOSIS — E039 Hypothyroidism, unspecified: Secondary | ICD-10-CM | POA: Diagnosis not present

## 2014-11-26 DIAGNOSIS — M25512 Pain in left shoulder: Secondary | ICD-10-CM | POA: Diagnosis not present

## 2014-11-26 DIAGNOSIS — I10 Essential (primary) hypertension: Secondary | ICD-10-CM | POA: Diagnosis not present

## 2014-11-26 DIAGNOSIS — G4730 Sleep apnea, unspecified: Secondary | ICD-10-CM

## 2014-11-26 DIAGNOSIS — R7989 Other specified abnormal findings of blood chemistry: Secondary | ICD-10-CM | POA: Diagnosis not present

## 2014-11-26 DIAGNOSIS — E119 Type 2 diabetes mellitus without complications: Secondary | ICD-10-CM | POA: Diagnosis not present

## 2014-11-26 DIAGNOSIS — R5383 Other fatigue: Secondary | ICD-10-CM | POA: Insufficient documentation

## 2014-11-26 MED ORDER — HYOSCYAMINE SULFATE 0.125 MG SL SUBL: SUBLINGUAL_TABLET | SUBLINGUAL | Status: AC

## 2014-11-26 NOTE — Progress Notes (Signed)
Pre visit review using our clinic review tool, if applicable. No additional management support is needed unless otherwise documented below in the visit note. 

## 2014-11-26 NOTE — Patient Instructions (Signed)
Cancel your lab appt -we are doing them today- for fatigue and wellness and pain  I think you have tendonitis of shoulder  Ice - as often as possible and continue meloxicam  If no improvement in 1-2 week = call and make an appt with Dr Lorelei Pont  Stop at check out for referral to Dr Gwenette Greet for sleep apnea f/u

## 2014-11-26 NOTE — Progress Notes (Signed)
Subjective:    Patient ID: Brooke Mitchell, female    DOB: Aug 11, 1951, 64 y.o.   MRN: [id number]  HPI Here with fatigue and generalized malaise   achey- in different places on and off  She takes tylenol  Has tramadol-hesitant to take it  Wonders if she has arthritis   No fever/rash or ST  No GI problems  No urinary symptoms   No swollen joints No red or warm joints  No change in appearance of joints   fam hx : father had OA   She was having trouble sleeping -then tried melatonin  Also using some essential oils- and that helps also  Gets around 7 hours of sleep  Still wakes up tired  She does snore - has sleep apnea and cpap  That may need to be adjusted - has not followed up in a while   Sees pulm - Dr Gwenette Greet     Chemistry      Component Value Date/Time   NA 140 01/20/2014 0823   K 3.6 01/20/2014 0823   CL 104 01/20/2014 0823   CO2 29 01/20/2014 0823   BUN 15 01/20/2014 0823   CREATININE 0.7 01/20/2014 0823   CREATININE 0.78 02/08/2013 1429      Component Value Date/Time   CALCIUM 9.2 01/20/2014 0823   ALKPHOS 44 01/20/2014 0823   AST 19 01/20/2014 0823   ALT 15 01/20/2014 0823   BILITOT 0.7 01/20/2014 0823      Lab Results  Component Value Date   WBC 5.6 01/20/2014   HGB 11.2* 01/20/2014   HCT 34.8* 01/20/2014   MCV 76.9* 01/20/2014   PLT 318.0 01/20/2014   Lab Results  Component Value Date   TSH 1.25 04/24/2014    Is on meloxicam    Review of Systems Review of Systems  Constitutional: Negative for fever, appetite change, and unexpected weight change. pos for fatigue  Eyes: Negative for pain and visual disturbance.  Respiratory: Negative for cough and shortness of breath.   Cardiovascular: Negative for cp or palpitations    Gastrointestinal: Negative for nausea, diarrhea and constipation.  Genitourinary: Negative for urgency and frequency.  Skin: Negative for pallor or rash   MSK pos for L shoulder pain -worse when reaching overhead , neg  for joint swelling  Neurological: Negative for weakness, light-headedness, numbness and headaches.  Hematological: Negative for adenopathy. Does not bruise/bleed easily.  Psychiatric/Behavioral: Negative for dysphoric mood. The patient is not nervous/anxious.         Objective:   Physical Exam  Constitutional: She appears well-developed and well-nourished. No distress.  obese and well appearing   HENT:  Head: Normocephalic and atraumatic.  Right Ear: External ear normal.  Left Ear: External ear normal.  Nose: Nose normal.  Mouth/Throat: Oropharynx is clear and moist.  Eyes: Conjunctivae and EOM are normal. Pupils are equal, round, and reactive to light. Right eye exhibits no discharge. Left eye exhibits no discharge. No scleral icterus.  Neck: Normal range of motion. Neck supple. No JVD present. Carotid bruit is not present. No thyromegaly present.  Cardiovascular: Normal rate, regular rhythm, normal heart sounds and intact distal pulses.  Exam reveals no gallop.   Pulmonary/Chest: Effort normal and breath sounds normal. No respiratory distress. She has no wheezes. She has no rales.  Abdominal: Soft. Bowel sounds are normal. She exhibits no distension and no mass. There is no tenderness.  Musculoskeletal: She exhibits tenderness. She exhibits no edema.  Left shoulder: She exhibits decreased range of motion and tenderness. She exhibits no bony tenderness, no swelling, no effusion, no crepitus, no deformity and normal pulse.  L shoulder: pos hawking and neer tests  Apprehensive to extend  Limited int/ext rot Some very mild acromion tenderness  No neuro changes   Lymphadenopathy:    She has no cervical adenopathy.  Neurological: She is alert. She has normal strength and normal reflexes. She displays no atrophy. No cranial nerve deficit or sensory deficit. She exhibits normal muscle tone. Coordination normal.  Skin: Skin is warm and dry. No rash noted. No erythema. No pallor.    Psychiatric: She has a normal mood and affect.          Assessment & Plan:   Problem List Items Addressed This Visit      Cardiovascular and Mediastinum   Essential hypertension    bp in fair control at this time  BP Readings from Last 1 Encounters:  11/26/14 138/78   No changes needed Disc lifstyle change with low sodium diet and exercise  Lab today      Relevant Orders   Lipid panel     Endocrine   Diabetes type 2, controlled   Relevant Orders   Hemoglobin A1c   Hypothyroidism   Relevant Orders   TSH     Other   Fatigue - Primary    Suspect sleep apnea is most likely cause - will return to pulmonary for re eval /perhaps pressure or cpap machine change is needed  Lab today as well  Annual exam is upcoming       Relevant Orders   CBC with Differential/Platelet   Comprehensive metabolic panel   TSH   Vitamin B12   Vit D  25 hydroxy (rtn osteoporosis monitoring)   Left shoulder pain    Suspect tendonitis based on exam Continue nsaid  Ice/cold compress frequently  Passive rom exercises  If no imp - make appt with sport med/Dr Copland       Relevant Orders   Sedimentation Rate   Sleep apnea    It sounds like she may need titration of her cpap -perhaps retesting  She has much worse daytime somnolence and does not wake rested  Ref to pulm for sleep apnea f/u      Relevant Orders   Ambulatory referral to Pulmonology

## 2014-11-27 LAB — CBC WITH DIFFERENTIAL/PLATELET
BASOS ABS: 0 10*3/uL (ref 0.0–0.1)
Basophils Relative: 0.4 % (ref 0.0–3.0)
EOS ABS: 0.1 10*3/uL (ref 0.0–0.7)
Eosinophils Relative: 1.4 % (ref 0.0–5.0)
HCT: 29.6 % — ABNORMAL LOW (ref 36.0–46.0)
HEMOGLOBIN: 9.1 g/dL — AB (ref 12.0–15.0)
LYMPHS PCT: 26.6 % (ref 12.0–46.0)
Lymphs Abs: 1.8 10*3/uL (ref 0.7–4.0)
MCHC: 31 g/dL (ref 30.0–36.0)
MCV: 66.6 fl — ABNORMAL LOW (ref 78.0–100.0)
MONOS PCT: 7.5 % (ref 3.0–12.0)
Monocytes Absolute: 0.5 10*3/uL (ref 0.1–1.0)
NEUTROS ABS: 4.4 10*3/uL (ref 1.4–7.7)
NEUTROS PCT: 64.1 % (ref 43.0–77.0)
PLATELETS: 354 10*3/uL (ref 150.0–400.0)
RBC: 4.44 Mil/uL (ref 3.87–5.11)
RDW: 16.4 % — AB (ref 11.5–15.5)
WBC: 6.9 10*3/uL (ref 4.0–10.5)

## 2014-11-27 LAB — SEDIMENTATION RATE: Sed Rate: 31 mm/hr — ABNORMAL HIGH (ref 0–22)

## 2014-11-27 LAB — COMPREHENSIVE METABOLIC PANEL
ALT: 13 U/L (ref 0–35)
AST: 19 U/L (ref 0–37)
Albumin: 4 g/dL (ref 3.5–5.2)
Alkaline Phosphatase: 53 U/L (ref 39–117)
BILIRUBIN TOTAL: 0.4 mg/dL (ref 0.2–1.2)
BUN: 14 mg/dL (ref 6–23)
CO2: 27 mEq/L (ref 19–32)
CREATININE: 0.77 mg/dL (ref 0.40–1.20)
Calcium: 9.4 mg/dL (ref 8.4–10.5)
Chloride: 103 mEq/L (ref 96–112)
GFR: 80.26 mL/min (ref >60.00)
GLUCOSE: 103 mg/dL — AB (ref 70–99)
POTASSIUM: 4 meq/L (ref 3.5–5.1)
Sodium: 138 mEq/L (ref 135–145)
Total Protein: 7.2 g/dL (ref 6.0–8.3)

## 2014-11-27 LAB — LIPID PANEL
CHOL/HDL RATIO: 4
Cholesterol: 167 mg/dL (ref 0–200)
HDL: 43.9 mg/dL (ref >39.00)
NonHDL: 123.1
TRIGLYCERIDES: 221 mg/dL — AB (ref 0.0–149.0)
VLDL: 44.2 mg/dL — ABNORMAL HIGH (ref 0.0–40.0)

## 2014-11-27 LAB — HEMOGLOBIN A1C: Hgb A1c MFr Bld: 6.6 % — ABNORMAL HIGH (ref 4.6–6.5)

## 2014-11-27 LAB — VITAMIN B12: Vitamin B-12: 48 pg/mL — ABNORMAL LOW (ref 211–911)

## 2014-11-27 LAB — LDL CHOLESTEROL, DIRECT: Direct LDL: 98 mg/dL

## 2014-11-27 LAB — TSH: TSH: 1.89 u[IU]/mL (ref 0.35–4.50)

## 2014-11-27 LAB — VITAMIN D 25 HYDROXY (VIT D DEFICIENCY, FRACTURES): VITD: 22.68 ng/mL — ABNORMAL LOW (ref 30.00–100.00)

## 2014-11-27 NOTE — Assessment & Plan Note (Signed)
Suspect tendonitis based on exam Continue nsaid  Ice/cold compress frequently  Passive rom exercises  If no imp - make appt with sport med/Dr Copland

## 2014-11-27 NOTE — Assessment & Plan Note (Signed)
bp in fair control at this time  BP Readings from Last 1 Encounters:  11/26/14 138/78   No changes needed Disc lifstyle change with low sodium diet and exercise  Lab today

## 2014-11-27 NOTE — Assessment & Plan Note (Signed)
Suspect sleep apnea is most likely cause - will return to pulmonary for re eval /perhaps pressure or cpap machine change is needed  Lab today as well  Annual exam is upcoming

## 2014-11-27 NOTE — Assessment & Plan Note (Signed)
It sounds like she may need titration of her cpap -perhaps retesting  She has much worse daytime somnolence and does not wake rested  Ref to pulm for sleep apnea f/u

## 2014-12-01 ENCOUNTER — Telehealth: Payer: Self-pay

## 2014-12-01 NOTE — Telephone Encounter (Signed)
-----   Message from Abner Greenspan, MD sent at 11/27/2014  4:09 PM EDT ----- B12 level is very low-this could cause fatigue Please schedule a B12 shot weekly for 4 weeks (then we will re check) Also inst her to get a generic B complex vitamin to take daily  Hb is fairly low - she is more anemic-this could also worsen fatigue -could a ferritin be added please if possible? (dx anemia iron def)  When she comes for her first B12 shot -please give her ifob cards (dx: colon cancer screening) - so it can be resulted before she comes for her annual exam We will likely need to get her back to GI for this  D level is low also-inst her to get 2000 iu vit D3 otc and take it daily  We will review all this in more detail at her annual exam

## 2014-12-01 NOTE — Telephone Encounter (Signed)
Patient aware of lab results and recommendations.  Appointments made.

## 2014-12-05 ENCOUNTER — Ambulatory Visit (INDEPENDENT_AMBULATORY_CARE_PROVIDER_SITE_OTHER): Payer: BC Managed Care – PPO | Admitting: *Deleted

## 2014-12-05 DIAGNOSIS — E538 Deficiency of other specified B group vitamins: Secondary | ICD-10-CM | POA: Diagnosis not present

## 2014-12-05 MED ORDER — CYANOCOBALAMIN 1000 MCG/ML IJ SOLN
1000.0000 ug | Freq: Once | INTRAMUSCULAR | Status: AC
  Administered 2014-12-05: 1000 ug via INTRAMUSCULAR

## 2014-12-09 ENCOUNTER — Encounter: Payer: Self-pay | Admitting: Gastroenterology

## 2014-12-12 ENCOUNTER — Other Ambulatory Visit: Payer: Self-pay | Admitting: Family Medicine

## 2014-12-12 ENCOUNTER — Ambulatory Visit (INDEPENDENT_AMBULATORY_CARE_PROVIDER_SITE_OTHER): Payer: BC Managed Care – PPO

## 2014-12-12 ENCOUNTER — Ambulatory Visit: Payer: BC Managed Care – PPO

## 2014-12-12 DIAGNOSIS — E538 Deficiency of other specified B group vitamins: Secondary | ICD-10-CM | POA: Diagnosis not present

## 2014-12-12 MED ORDER — CYANOCOBALAMIN 1000 MCG/ML IJ SOLN
1000.0000 ug | Freq: Once | INTRAMUSCULAR | Status: AC
  Administered 2014-12-12: 1000 ug via INTRAMUSCULAR

## 2014-12-19 ENCOUNTER — Ambulatory Visit (INDEPENDENT_AMBULATORY_CARE_PROVIDER_SITE_OTHER): Payer: BC Managed Care – PPO | Admitting: *Deleted

## 2014-12-19 ENCOUNTER — Other Ambulatory Visit: Payer: BC Managed Care – PPO

## 2014-12-19 DIAGNOSIS — D519 Vitamin B12 deficiency anemia, unspecified: Secondary | ICD-10-CM | POA: Diagnosis not present

## 2014-12-19 MED ORDER — CYANOCOBALAMIN 1000 MCG/ML IJ SOLN
1000.0000 ug | Freq: Once | INTRAMUSCULAR | Status: AC
  Administered 2014-12-19: 1000 ug via INTRAMUSCULAR

## 2014-12-22 ENCOUNTER — Encounter: Payer: Self-pay | Admitting: Family Medicine

## 2014-12-22 ENCOUNTER — Ambulatory Visit (INDEPENDENT_AMBULATORY_CARE_PROVIDER_SITE_OTHER): Payer: BC Managed Care – PPO | Admitting: Family Medicine

## 2014-12-22 VITALS — BP 124/62 | HR 105 | Temp 98.5°F | Ht 64.5 in | Wt 210.2 lb

## 2014-12-22 DIAGNOSIS — E119 Type 2 diabetes mellitus without complications: Secondary | ICD-10-CM

## 2014-12-22 DIAGNOSIS — E538 Deficiency of other specified B group vitamins: Secondary | ICD-10-CM | POA: Diagnosis not present

## 2014-12-22 DIAGNOSIS — Z0000 Encounter for general adult medical examination without abnormal findings: Secondary | ICD-10-CM

## 2014-12-22 DIAGNOSIS — E785 Hyperlipidemia, unspecified: Secondary | ICD-10-CM

## 2014-12-22 DIAGNOSIS — I10 Essential (primary) hypertension: Secondary | ICD-10-CM | POA: Diagnosis not present

## 2014-12-22 DIAGNOSIS — D649 Anemia, unspecified: Secondary | ICD-10-CM

## 2014-12-22 DIAGNOSIS — E039 Hypothyroidism, unspecified: Secondary | ICD-10-CM | POA: Diagnosis not present

## 2014-12-22 NOTE — Progress Notes (Signed)
Subjective:    Patient ID: Brooke Mitchell, female    DOB: 09/22/1950, 64 y.o.   MRN: [id number]  HPI Here for health maintenance exam and to review chronic medical problems    Wt is stable  Obese with bmi of 55 Working on lifestyle change   Hep C/HIV screen- declines/not high risk   colonosc 2/14 and EGD Has a letter to schedule EGD now - is due for a 2 y re check  Colonoscopy was normal  Not seeing blood in stool  Did stool cards -will send back    Ptx 7/10   Flu shot 10/15   Mammogram 1/16 nl  Self exam- no lumps   Pap 6/14 No abn paps  No gyn symptoms  Nl   Td 7/11  Zoster vaccine 8/12   B12 level was 48  Started B12 shots weekly Taking B complex vitamin daily also  She is feeling a little better   Anemia Lab Results  Component Value Date   WBC 6.9 11/26/2014   HGB 9.1* 11/26/2014   HCT 29.6* 11/26/2014   MCV 66.6 Repeated and verified X2.* 11/26/2014   PLT 354.0 11/26/2014    Lab Results  Component Value Date   FERRITIN 3.3* 11/21/2012  is tired No hx of anemia in family  Father had B12 def   bp is stable today  No cp or palpitations or headaches or edema  No side effects to medicines  BP Readings from Last 3 Encounters:  12/22/14 124/62  11/26/14 138/78  08/26/14 128/76     DM2 Lab Results  Component Value Date   HGBA1C 6.6* 11/26/2014   overall stable - no high or low blood sugars  Working on diet   Cholesterol Lab Results  Component Value Date   CHOL 167 11/26/2014   CHOL 151 01/20/2014   CHOL 171 07/08/2013   Lab Results  Component Value Date   HDL 43.90 11/26/2014   HDL 41.00 01/20/2014   HDL 46.10 07/08/2013   Lab Results  Component Value Date   LDLCALC 77 01/20/2014   LDLCALC 98 07/08/2013   LDLCALC 82 10/08/2012   Lab Results  Component Value Date   TRIG 221.0* 11/26/2014   TRIG 163.0* 01/20/2014   TRIG 137.0 07/08/2013   Lab Results  Component Value Date   CHOLHDL 4 11/26/2014   CHOLHDL 4 01/20/2014     CHOLHDL 4 07/08/2013   Lab Results  Component Value Date   LDLDIRECT 98.0 11/26/2014   LDLDIRECT 78.4 03/16/2010   LDLDIRECT 92.3 04/02/2008   overall stable  Will keep working on diet  lipitor 20 mg   Hypothyroidism  Pt has no clinical changes No change in energy level/ hair or skin/ edema and no tremor Lab Results  Component Value Date   TSH 1.89 11/26/2014    No changes needed   Hx of low D  Level is 22  On 2000 iu plus MVI     Chemistry      Component Value Date/Time   NA 138 11/26/2014 1704   K 4.0 11/26/2014 1704   CL 103 11/26/2014 1704   CO2 27 11/26/2014 1704   BUN 14 11/26/2014 1704   CREATININE 0.77 11/26/2014 1704   CREATININE 0.78 02/08/2013 1429      Component Value Date/Time   CALCIUM 9.4 11/26/2014 1704   ALKPHOS 53 11/26/2014 1704   AST 19 11/26/2014 1704   ALT 13 11/26/2014 1704   BILITOT 0.4 11/26/2014 1704  Patient Active Problem List   Diagnosis Date Noted  . Vitamin B 12 deficiency 12/05/2014  . Fatigue 11/26/2014  . Left shoulder pain 11/26/2014  . Viral URI with cough 08/26/2014  . Cellulitis of finger 05/30/2014  . Grief reaction 01/22/2014  . Colon cancer screening 10/24/2012  . Anemia 10/24/2012  . Trochanteric bursitis of left hip 11/14/2011  . Obesity 10/19/2011  . Other screening mammogram 04/13/2011  . Routine general medical examination at a health care facility 04/07/2011  . UTI'S, RECURRENT 02/03/2010  . POSTMENOPAUSAL STATUS 03/28/2008  . Hypothyroidism 11/24/2006  . Diabetes type 2, controlled 11/24/2006  . Hyperlipidemia 11/24/2006  . RESTLESS LEG SYNDROME 11/24/2006  . Essential hypertension 11/24/2006  . PREMATURE VENTRICULAR CONTRACTIONS 11/24/2006  . HEMORRHOIDS 11/24/2006  . ALLERGIC RHINITIS 11/24/2006  . ASTHMA 11/24/2006  . GERD 11/24/2006  . HIATAL HERNIA 11/24/2006  . OVERACTIVE BLADDER 11/24/2006  . Thoreau DISEASE, CERVICAL 11/24/2006  . Sleep apnea 11/24/2006   Past Medical History   Diagnosis Date  . Allergic rhinitis   . Asthma     As a child   . GERD (gastroesophageal reflux disease)   . Hyperlipidemia   . Hypertension   . Hypothyroidism   . Diabetes mellitus     Type II (2/06 elevated microalbumin)  . Obesity   . OSA (obstructive sleep apnea)     CPAP  . Frequent UTI     with coital prohylaxis   . Anemia    Past Surgical History  Procedure Laterality Date  . Foot surgery    . Endometrial biopsy    . Dilation and curettage of uterus     History  Substance Use Topics  . Smoking status: Never Smoker   . Smokeless tobacco: Never Used  . Alcohol Use: 0.0 oz/week    0 Standard drinks or equivalent per week     Comment: occasional   Family History  Problem Relation Age of Onset  . Diabetes Mother   . Coronary artery disease Mother   . Hypothyroidism Mother   . Irritable bowel syndrome Mother   . Alzheimer's disease Father   . Nephrolithiasis Father   . Hypothyroidism Brother   . Colon cancer Neg Hx    No Known Allergies Current Outpatient Prescriptions on File Prior to Visit  Medication Sig Dispense Refill  . acetaminophen (TYLENOL) 325 MG tablet Take 650 mg by mouth as needed for pain.    Marland Kitchen ADVAIR DISKUS 100-50 MCG/DOSE AEPB USE 1 PUFF TWICE DAILY DURING ALLERGY SEASON 60 each 1  . albuterol (PROVENTIL,VENTOLIN) 90 MCG/ACT inhaler Inhale 2 puffs into the lungs every 4 (four) hours as needed.      Marland Kitchen aspirin 81 MG tablet Take 81 mg by mouth daily.      Marland Kitchen atorvastatin (LIPITOR) 20 MG tablet TAKE 1/2 TABLET (10 MG TOTAL) BY MOUTH DAILY. 15 tablet 5  . calcium carbonate (CALCIUM 600) 600 MG TABS Take 600 mg by mouth 2 (two) times daily with a meal.      . cyanocobalamin (,VITAMIN B-12,) 1000 MCG/ML injection Inject 1,000 mcg into the muscle once a week. Weekly for 4 weeks    . FLUoxetine (PROZAC) 20 MG capsule TAKE 1 CAPSULE BY MOUTH DAILY. 90 capsule 3  . fosinopril-hydrochlorothiazide (MONOPRIL-HCT) 10-12.5 MG per tablet TAKE 1 TABLET BY MOUTH  DAILY. 30 tablet 5  . GLIPIZIDE XL 10 MG 24 hr tablet TAKE 1 TABLET (10 MG TOTAL) BY MOUTH DAILY. 30 tablet 5  . hyoscyamine (LEVSIN  SL) 0.125 MG SL tablet Place one tablet under tongue once a day as needed. 30 tablet 1  . loratadine (CLARITIN) 10 MG tablet Take 10 mg by mouth daily.      . meloxicam (MOBIC) 15 MG tablet TAKE 1 TABLET BY MOUTH DAILY AS NEEDED WITH FOOD 90 tablet 1  . metFORMIN (GLUCOPHAGE) 1000 MG tablet TAKE 1 TABLET (1,000 MG TOTAL) BY MOUTH 2 (TWO) TIMES DAILY WITH A MEAL. 60 tablet 5  . multivitamin (THERAGRAN) per tablet Take 1 tablet by mouth daily.      . ONE TOUCH ULTRA TEST test strip CHECK SUGAR TWICE A DAY AS INSTRUCTED (DM 250.0) THAT IS NOT OPTIMALLYCONTROLLED 100 each 0  . PROAIR HFA 108 (90 BASE) MCG/ACT inhaler USE 2 PUFFS UPTO EVERY 4 HOURS AS NEEDED FOR WHEEZING 8.5 g 1  . SYNTHROID 50 MCG tablet TAKE 1 TABLET (50 MCG TOTAL) BY MOUTH DAILY. 30 tablet 5   No current facility-administered medications on file prior to visit.    Review of Systems Review of Systems  Constitutional: Negative for fever, appetite change, and unexpected weight change. pos for generalized fatigue  Eyes: Negative for pain and visual disturbance.  Respiratory: Negative for cough and shortness of breath.   Cardiovascular: Negative for cp or palpitations    Gastrointestinal: Negative for nausea, diarrhea and constipation.  Genitourinary: Negative for urgency and frequency.  Skin: Negative for pallor or rash   Neurological: Negative for weakness, light-headedness, numbness and headaches.  Hematological: Negative for adenopathy. Does not bruise/bleed easily.  Psychiatric/Behavioral: Negative for dysphoric mood. The patient is not nervous/anxious.         Objective:   Physical Exam  Constitutional: She appears well-developed and well-nourished. No distress.  obese and well appearing   HENT:  Head: Normocephalic and atraumatic.  Right Ear: External ear normal.  Left Ear: External  ear normal.  Mouth/Throat: Oropharynx is clear and moist.  Eyes: EOM are normal. Pupils are equal, round, and reactive to light. No scleral icterus.  Mild conj pallor   Neck: Normal range of motion. Neck supple. No JVD present. Carotid bruit is not present. No thyromegaly present.  Cardiovascular: Normal rate, regular rhythm, normal heart sounds and intact distal pulses.  Exam reveals no gallop.   Pulmonary/Chest: Effort normal and breath sounds normal. No respiratory distress. She has no wheezes. She exhibits no tenderness.  Abdominal: Soft. Bowel sounds are normal. She exhibits no distension, no abdominal bruit and no mass. There is no tenderness.  Genitourinary: No breast swelling, tenderness, discharge or bleeding.  Breast exam: No mass, nodules, thickening, tenderness, bulging, retraction, inflamation, nipple discharge or skin changes noted.  No axillary or clavicular LA.      Musculoskeletal: Normal range of motion. She exhibits no edema or tenderness.  Lymphadenopathy:    She has no cervical adenopathy.  Neurological: She is alert. She has normal reflexes. No cranial nerve deficit. She exhibits normal muscle tone. Coordination normal.  Skin: Skin is warm and dry. No rash noted. No erythema. No pallor.  Lipomas noted on upper back and epigastric area   Psychiatric: She has a normal mood and affect.          Assessment & Plan:   Problem List Items Addressed This Visit      Cardiovascular and Mediastinum   Essential hypertension - Primary    bp in fair control at this time  BP Readings from Last 1 Encounters:  12/22/14 124/62   No changes needed Disc  lifstyle change with low sodium diet and exercise  Labs reviewed         Digestive   Vitamin B 12 deficiency    Lab Results  Component Value Date   VITAMINB12 48* 11/26/2014  has had 3 of 4 B12 inj For 4th on Friday Lab on Monday for B12 level and iron tests as well for microcytic anemia  Disc pernicious anemia Will  need f/u with GI for endoscopy- she will call to schedule - 2 year f/u       Relevant Orders   Vitamin B12     Endocrine   Diabetes type 2, controlled    Overall stable and well controlled  Lab Results  Component Value Date   HGBA1C 6.6* 11/26/2014  rev need for low glycemic diet and wt loss  Will re check in 6 mo       Hypothyroidism    Hypothyroidism  Pt has no clinical changes No change in energy level/ hair or skin/ edema and no tremor Lab Results  Component Value Date   TSH 1.89 11/26/2014             Other   Anemia    Hb of 9.1  Stool cards pending Hx of Upper GI polyps- for EGD soon  Will re check cbc and ferritin on Monday Very low B12- 3rd shot of 4 in 4 weeks - level Monday as well   Pt is fatigued        Relevant Medications   cyanocobalamin 2000 MCG tablet   IRON, FERROUS GLUCONATE, PO   Other Relevant Orders   CBC with Differential/Platelet   Vitamin B12   Ferritin   Hyperlipidemia    Disc goals for lipids and reasons to control them Rev labs with pt Rev low sat fat diet in detail Controlled with lipitor and diet          Routine general medical examination at a health care facility    Reviewed health habits including diet and exercise and skin cancer prevention Reviewed appropriate screening tests for age  Also reviewed health mt list, fam hx and immunization status , as well as social and family history    See HPI Labs rev  Will inc vit D to 4000 iu daily  Get GI f/u for anemia and EGD

## 2014-12-22 NOTE — Assessment & Plan Note (Signed)
Disc goals for lipids and reasons to control them Rev labs with pt Rev low sat fat diet in detail Controlled with lipitor and diet  

## 2014-12-22 NOTE — Assessment & Plan Note (Signed)
Overall stable and well controlled  Lab Results  Component Value Date   HGBA1C 6.6* 11/26/2014  rev need for low glycemic diet and wt loss  Will re check in 6 mo

## 2014-12-22 NOTE — Assessment & Plan Note (Signed)
bp in fair control at this time  BP Readings from Last 1 Encounters:  12/22/14 124/62   No changes needed Disc lifstyle change with low sodium diet and exercise  Labs reviewed

## 2014-12-22 NOTE — Assessment & Plan Note (Signed)
Reviewed health habits including diet and exercise and skin cancer prevention Reviewed appropriate screening tests for age  Also reviewed health mt list, fam hx and immunization status , as well as social and family history    See HPI Labs rev  Will inc vit D to 4000 iu daily  Get GI f/u for anemia and EGD

## 2014-12-22 NOTE — Assessment & Plan Note (Signed)
Hb of 9.1  Stool cards pending Hx of Upper GI polyps- for EGD soon  Will re check cbc and ferritin on Monday Very low B12- 3rd shot of 4 in 4 weeks - level Monday as well   Pt is fatigued

## 2014-12-22 NOTE — Assessment & Plan Note (Signed)
Lab Results  Component Value Date   VITAMINB12 48* 11/26/2014  has had 3 of 4 B12 inj For 4th on Friday Lab on Monday for B12 level and iron tests as well for microcytic anemia  Disc pernicious anemia Will need f/u with GI for endoscopy- she will call to schedule - 2 year f/u

## 2014-12-22 NOTE — Assessment & Plan Note (Signed)
Hypothyroidism  Pt has no clinical changes No change in energy level/ hair or skin/ edema and no tremor Lab Results  Component Value Date   TSH 1.89 11/26/2014

## 2014-12-22 NOTE — Progress Notes (Signed)
Pre visit review using our clinic review tool, if applicable. No additional management support is needed unless otherwise documented below in the visit note. 

## 2014-12-22 NOTE — Patient Instructions (Signed)
Re schedule Friday's labs for Monday  Do get in touch with GI about endoscopy  Increase your vitamin D to 4000 iu daily  Take care of yourself

## 2014-12-24 ENCOUNTER — Encounter: Payer: Self-pay | Admitting: Family Medicine

## 2014-12-24 ENCOUNTER — Ambulatory Visit (INDEPENDENT_AMBULATORY_CARE_PROVIDER_SITE_OTHER): Payer: BC Managed Care – PPO | Admitting: Family Medicine

## 2014-12-24 VITALS — BP 140/80 | HR 79 | Temp 98.5°F | Ht 64.5 in | Wt 210.5 lb

## 2014-12-24 DIAGNOSIS — M7582 Other shoulder lesions, left shoulder: Secondary | ICD-10-CM | POA: Diagnosis not present

## 2014-12-24 DIAGNOSIS — M778 Other enthesopathies, not elsewhere classified: Secondary | ICD-10-CM

## 2014-12-24 MED ORDER — METHYLPREDNISOLONE ACETATE 40 MG/ML IJ SUSP
80.0000 mg | Freq: Once | INTRAMUSCULAR | Status: AC
  Administered 2014-12-24: 80 mg via INTRA_ARTICULAR

## 2014-12-24 NOTE — Progress Notes (Signed)
Dr. Frederico Hamman T. Cniyah Sproull, MD, [facility] [place], [address] Phone: [phone] Fax: [phone]  12/24/2014  Patient: Brooke Mitchell, MRN: [id number], DOB: 31-May-1951, 64 y.o.  Primary Physician:  Loura Pardon, MD  Chief Complaint: Shoulder Pain  Subjective:   This 64 y.o. female patient noted above presents with shoulder pain that has been ongoing for 2 mo. there is no history of trauma or accident recently The patient denies neck pain or radicular symptoms. Denies dislocation, subluxation, separation of the shoulder. The patient does complain of pain in the overhead plane with significant painful arc of motion.  L shoulder: slowly came on the left. Last month, and last few days has been improved.  No sports.  Medications Tried: Tylenol, NSAIDS Ice or Heat: minimally helpful Tried PT: No  Prior shoulder Injury: None on L Prior surgery: No Prior fracture: No  The PMH, PSH, Social History, Family History, Medications, and allergies have been reviewed in [facility], and have been updated if relevant.  GEN: No fevers, chills. Nontoxic. Primarily MSK c/o today. MSK: Detailed in the HPI GI: tolerating PO intake without difficulty Neuro: No numbness, parasthesias, or tingling associated. Otherwise the pertinent positives of the ROS are noted above.   Objective:   Blood pressure 140/80, pulse 79, temperature 98.5 F (36.9 C), temperature source Oral, height 5' 4.5" (1.638 m), weight 210 lb 8 oz (95.482 kg).  GEN: Well-developed,well-nourished,in no acute distress; alert,appropriate and cooperative throughout examination HEENT: Normocephalic and atraumatic without obvious abnormalities. Ears, externally no deformities PULM: Breathing comfortably in no respiratory distress EXT: No clubbing, cyanosis, or edema PSYCH: Normally interactive. Cooperative during the interview. Pleasant. Friendly and conversant. Not anxious or  depressed appearing. Normal, full affect.  Shoulder: LEFT Inspection: No muscle wasting or winging Ecchymosis/edema: neg  AC joint, scapula, clavicle: NT Cervical spine: NT, full ROM Spurling's: neg Abduction: full, 5/5, painful arc of motion Flexion: full, 5/5 IR, full, lift-off: 5/5 ER at neutral: full, 5/5 AC crossover: neg Neer: pos Hawkins: pos Drop Test: neg Empty Can: pos Supraspinatus insertion: mild-mod T Bicipital groove: TTP Speed's: neg Yergason's: neg Sulcus sign: neg Scapular dyskinesis: none C5-T1 intact  Neuro: Sensation intact Grip 5/5   Radiology: No results found.  Assessment and Plan:    Rotator cuff tendonitis, left - Plan: Ambulatory referral to Physical Therapy, methylPREDNISolone acetate (DEPO-MEDROL) injection 80 mg  Subacromial tendinitis, left - Plan: Ambulatory referral to Physical Therapy, methylPREDNISolone acetate (DEPO-MEDROL) injection 80 mg  Using an anatomical model, I reviewed with the patient the structures involved and how they related to their diagnosis .   Rotator cuff strengthening and scapular stabilization exercises were reviewed with the patient. The patient could benefit from formal PT to assist with scapular stabilization and RTC strengthening.   SubAC Injection, LEFT Verbal consent was obtained from the patient. Risks (including rare infection), benefits, and alternatives were explained. Patient prepped with Chloraprep and Ethyl Chloride used for anesthesia. The subacromial space was injected using the posterior approach. The patient tolerated the procedure well and had decreased pain post injection. No complications. Injection: 8 cc of Lidocaine 1% and Depo-Medrol 80 mg. Needle: 22 gauge   Follow-up: 6-8 weeks  New Prescriptions   No medications on file   Orders Placed This Encounter  Procedures  . Ambulatory referral to Physical Therapy    Signed,  Frederico Hamman T. Swara Donze, MD   Patient's Medications  New  Prescriptions   No medications on  file  Previous Medications   ACETAMINOPHEN (TYLENOL) 325 MG TABLET    Take 650 mg by mouth as needed for pain.   ADVAIR DISKUS 100-50 MCG/DOSE AEPB    USE 1 PUFF TWICE DAILY DURING ALLERGY SEASON   ALBUTEROL (PROVENTIL,VENTOLIN) 90 MCG/ACT INHALER    Inhale 2 puffs into the lungs every 4 (four) hours as needed.     ASPIRIN 81 MG TABLET    Take 81 mg by mouth daily.     ATORVASTATIN (LIPITOR) 20 MG TABLET    TAKE 1/2 TABLET (10 MG TOTAL) BY MOUTH DAILY.   CALCIUM CARBONATE (CALCIUM 600) 600 MG TABS    Take 600 mg by mouth 2 (two) times daily with a meal.     CYANOCOBALAMIN (,VITAMIN B-12,) 1000 MCG/ML INJECTION    Inject 1,000 mcg into the muscle once a week. Weekly for 4 weeks   CYANOCOBALAMIN 2000 MCG TABLET    Take 2,000 mcg by mouth daily.   FLUOXETINE (PROZAC) 20 MG CAPSULE    TAKE 1 CAPSULE BY MOUTH DAILY.   FOSINOPRIL-HYDROCHLOROTHIAZIDE (MONOPRIL-HCT) 10-12.5 MG PER TABLET    TAKE 1 TABLET BY MOUTH DAILY.   GLIPIZIDE XL 10 MG 24 HR TABLET    TAKE 1 TABLET (10 MG TOTAL) BY MOUTH DAILY.   HYOSCYAMINE (LEVSIN SL) 0.125 MG SL TABLET    Place one tablet under tongue once a day as needed.   IRON, FERROUS GLUCONATE, PO    Take by mouth.   LORATADINE (CLARITIN) 10 MG TABLET    Take 10 mg by mouth daily.     MELOXICAM (MOBIC) 15 MG TABLET    TAKE 1 TABLET BY MOUTH DAILY AS NEEDED WITH FOOD   METFORMIN (GLUCOPHAGE) 1000 MG TABLET    TAKE 1 TABLET (1,000 MG TOTAL) BY MOUTH 2 (TWO) TIMES DAILY WITH A MEAL.   MULTIVITAMIN (THERAGRAN) PER TABLET    Take 1 tablet by mouth daily.     ONE TOUCH ULTRA TEST TEST STRIP    CHECK SUGAR TWICE A DAY AS INSTRUCTED (DM 250.0) THAT IS NOT OPTIMALLYCONTROLLED   PROAIR HFA 108 (90 BASE) MCG/ACT INHALER    USE 2 PUFFS UPTO EVERY 4 HOURS AS NEEDED FOR WHEEZING   SYNTHROID 50 MCG TABLET    TAKE 1 TABLET (50 MCG TOTAL) BY MOUTH DAILY.   VITAMIN D, CHOLECALCIFEROL, 400 UNITS CAPS    Take by mouth.  Modified Medications   No  medications on file  Discontinued Medications   No medications on file

## 2014-12-24 NOTE — Progress Notes (Signed)
Pre visit review using our clinic review tool, if applicable. No additional management support is needed unless otherwise documented below in the visit note. 

## 2014-12-25 ENCOUNTER — Other Ambulatory Visit (INDEPENDENT_AMBULATORY_CARE_PROVIDER_SITE_OTHER): Payer: BC Managed Care – PPO

## 2014-12-25 DIAGNOSIS — Z1211 Encounter for screening for malignant neoplasm of colon: Secondary | ICD-10-CM

## 2014-12-25 LAB — FECAL OCCULT BLOOD, IMMUNOCHEMICAL: Fecal Occult Bld: NEGATIVE

## 2014-12-26 ENCOUNTER — Other Ambulatory Visit: Payer: BC Managed Care – PPO

## 2014-12-26 ENCOUNTER — Ambulatory Visit (INDEPENDENT_AMBULATORY_CARE_PROVIDER_SITE_OTHER): Payer: BC Managed Care – PPO

## 2014-12-26 DIAGNOSIS — E538 Deficiency of other specified B group vitamins: Secondary | ICD-10-CM

## 2014-12-26 MED ORDER — CYANOCOBALAMIN 1000 MCG/ML IJ SOLN
1000.0000 ug | Freq: Once | INTRAMUSCULAR | Status: AC
  Administered 2014-12-26: 1000 ug via INTRAMUSCULAR

## 2014-12-29 ENCOUNTER — Other Ambulatory Visit (INDEPENDENT_AMBULATORY_CARE_PROVIDER_SITE_OTHER): Payer: BC Managed Care – PPO

## 2014-12-29 DIAGNOSIS — E538 Deficiency of other specified B group vitamins: Secondary | ICD-10-CM | POA: Diagnosis not present

## 2014-12-29 DIAGNOSIS — D649 Anemia, unspecified: Secondary | ICD-10-CM | POA: Diagnosis not present

## 2014-12-29 LAB — FERRITIN: Ferritin: 1.6 ng/mL — ABNORMAL LOW (ref 10.0–291.0)

## 2014-12-29 LAB — CBC WITH DIFFERENTIAL/PLATELET
Basophils Absolute: 0 10*3/uL (ref 0.0–0.1)
Basophils Relative: 0.3 % (ref 0.0–3.0)
EOS PCT: 1.2 % (ref 0.0–5.0)
Eosinophils Absolute: 0.1 10*3/uL (ref 0.0–0.7)
HCT: 30 % — ABNORMAL LOW (ref 36.0–46.0)
HEMOGLOBIN: 9.4 g/dL — AB (ref 12.0–15.0)
Lymphocytes Relative: 23.7 % (ref 12.0–46.0)
Lymphs Abs: 1.6 10*3/uL (ref 0.7–4.0)
MCHC: 31.2 g/dL (ref 30.0–36.0)
MONO ABS: 0.5 10*3/uL (ref 0.1–1.0)
Monocytes Relative: 7.7 % (ref 3.0–12.0)
NEUTROS PCT: 67.1 % (ref 43.0–77.0)
Neutro Abs: 4.5 10*3/uL (ref 1.4–7.7)
Platelets: 364 10*3/uL (ref 150.0–400.0)
RBC: 4.56 Mil/uL (ref 3.87–5.11)
RDW: 16.9 % — AB (ref 11.5–15.5)
WBC: 6.7 10*3/uL (ref 4.0–10.5)

## 2014-12-29 LAB — VITAMIN B12: Vitamin B-12: 561 pg/mL (ref 211–911)

## 2014-12-30 ENCOUNTER — Telehealth: Payer: Self-pay | Admitting: Family Medicine

## 2014-12-30 DIAGNOSIS — D649 Anemia, unspecified: Secondary | ICD-10-CM

## 2014-12-30 NOTE — Telephone Encounter (Signed)
Patient returned Shapale's call.  She'll be at her home number until 4:15.

## 2014-12-30 NOTE — Telephone Encounter (Signed)
-----   Message from Tammi Sou, [place] sent at 12/30/2014  4:26 PM EDT ----- Pt notified of Dr. Marliss Coots comments/instructions, B12 inj appt scheduled. Pt said she is taking iron, pt said 400 (not sure mg or units), pt said she has an appt with GI but it isn't until 02/20/15 and she wasn't sure if that was to long to wait for an appt

## 2014-12-30 NOTE — Telephone Encounter (Signed)
Let her know I will go ahead and do a referral - so they can look at her labs and see if they want to see her sooner

## 2014-12-31 NOTE — Telephone Encounter (Signed)
Thanks - continue those and f/u with GI as planned

## 2014-12-31 NOTE — Telephone Encounter (Signed)
Brooke Mitchell was able to move up her appt to Tuesday, May 10th at 3:30 and patient is aware. Also per patient she wanted to let you know that she is only taking 27mg  of iron once a day. She is taking 4000 of vitamin D.

## 2015-01-13 ENCOUNTER — Encounter: Payer: Self-pay | Admitting: Gastroenterology

## 2015-01-13 ENCOUNTER — Other Ambulatory Visit (INDEPENDENT_AMBULATORY_CARE_PROVIDER_SITE_OTHER): Payer: BC Managed Care – PPO

## 2015-01-13 ENCOUNTER — Ambulatory Visit (INDEPENDENT_AMBULATORY_CARE_PROVIDER_SITE_OTHER): Payer: BC Managed Care – PPO | Admitting: Gastroenterology

## 2015-01-13 VITALS — BP 128/74 | HR 76 | Ht 64.25 in | Wt 208.5 lb

## 2015-01-13 DIAGNOSIS — D509 Iron deficiency anemia, unspecified: Secondary | ICD-10-CM

## 2015-01-13 DIAGNOSIS — Z8719 Personal history of other diseases of the digestive system: Secondary | ICD-10-CM

## 2015-01-13 DIAGNOSIS — E538 Deficiency of other specified B group vitamins: Secondary | ICD-10-CM

## 2015-01-13 LAB — IGA: IgA: 147 mg/dL (ref 68–378)

## 2015-01-13 MED ORDER — NA SULFATE-K SULFATE-MG SULF 17.5-3.13-1.6 GM/177ML PO SOLN: 1.0000 | Freq: Once | ORAL | Status: DC

## 2015-01-13 NOTE — Progress Notes (Signed)
    History of Present Illness: This is a 64 year old female with recurrent Fe deficiency and new B12 deficiency. Ferritin=1.6, B12 =48, Hb=9.4. Stool is hemoccult negative. She a history of a 2.5 cm inflammatory hyperplastic polyp vs hamartomatous gastric polyp that had an area of low-grade dysplasia removed at EGD in 01/2013. Colonoscopy performed in 01/2013 was normal except for internal hemorrhoids. Celiac panel in 12/2012 was negative. Denies weight loss, abdominal pain, constipation, diarrhea, change in stool caliber, melena, hematochezia, nausea, vomiting, dysphagia, reflux symptoms, chest pain.  Current Medications, Allergies, Past Medical History, Past Surgical History, Family History and Social History were reviewed in [employer] record.  Physical Exam: General: Well developed , well nourished, no acute distress Head: Normocephalic and atraumatic Eyes:  sclerae anicteric, EOMI Ears: Normal auditory acuity Mouth: No deformity or lesions Lungs: Clear throughout to auscultation Heart: Regular rate and rhythm; no murmurs, rubs or bruits Abdomen: Soft, non tender and non distended. No masses, hepatosplenomegaly or hernias noted. Normal Bowel sounds Rectal: deferred to colonoscopy Musculoskeletal: Symmetrical with no gross deformities  Pulses:  Normal pulses noted Extremities: No clubbing, cyanosis, edema or deformities noted Neurological: Alert oriented x 4, grossly nonfocal Psychological:  Alert and cooperative. Normal mood and affect  Assessment and Recommendations:  1. Recurrent Fe deficiency anemia. Heme negative stool. Personal history of a large gastric polyp with focal dysplasia. R/O UGI and colonic sources of blood loss although with heme negative stool and prior EGD/colonoscopy this is unlikely. Suspect she has poor Fe absorption and will need Fe replacement long term. IgA and tTG. Schedule endoscopy to assess for completeness of polypectomy, for recurrent  polyps and for causes of Fe deficiency. The risks (including bleeding, perforation, infection, missed lesions, medication reactions and possible hospitalization or surgery if complications occur), benefits, and alternatives to endoscopy with possible biopsy and possible dilation were discussed with the patient and they consent to proceed.  The risks (including bleeding, perforation, infection, missed lesions, medication reactions and possible hospitalization or surgery if complications occur), benefits, and alternatives to colonoscopy with possible biopsy and possible polypectomy were discussed with the patient and they consent to proceed.

## 2015-01-13 NOTE — Patient Instructions (Signed)
You have been scheduled for an endoscopy and colonoscopy. Please follow the written instructions given to you at your visit today. Please pick up your prep supplies at the pharmacy within the next 1-3 days. If you use inhalers (even only as needed), please bring them with you on the day of your procedure. Your physician has requested that you go to www.[email or web address].[email or web address] and enter the access code given to you at your visit today. This web site gives a general overview about your procedure. However, you should still follow specific instructions given to you by our office regarding your preparation for the procedure. Your physician has requested that you go to the basement for lab work before leaving today. CC: Loura Pardon MD

## 2015-01-15 ENCOUNTER — Encounter: Payer: Self-pay | Admitting: Gastroenterology

## 2015-01-15 LAB — TISSUE TRANSGLUTAMINASE, IGA: Tissue Transglutaminase Ab, IgA: 1 U/mL (ref <4)

## 2015-01-19 ENCOUNTER — Encounter: Payer: Self-pay | Admitting: Gastroenterology

## 2015-01-19 ENCOUNTER — Ambulatory Visit (AMBULATORY_SURGERY_CENTER): Payer: BC Managed Care – PPO | Admitting: Gastroenterology

## 2015-01-19 ENCOUNTER — Other Ambulatory Visit: Payer: Self-pay | Admitting: Gastroenterology

## 2015-01-19 VITALS — BP 134/66 | HR 71 | Temp 97.4°F | Resp 20 | Ht 64.25 in | Wt 208.0 lb

## 2015-01-19 DIAGNOSIS — D509 Iron deficiency anemia, unspecified: Secondary | ICD-10-CM

## 2015-01-19 DIAGNOSIS — Z8719 Personal history of other diseases of the digestive system: Secondary | ICD-10-CM

## 2015-01-19 MED ORDER — SODIUM CHLORIDE 0.9 % IV SOLN: 500.0000 mL | INTRAVENOUS | Status: DC

## 2015-01-19 NOTE — Progress Notes (Signed)
Pt has albuterol and advair inhaler with her today.  Both labaled and under the stretcher with pt. maw

## 2015-01-19 NOTE — Op Note (Signed)
[place]  [address] [place], [address]   COLONOSCOPY PROCEDURE REPORT  PATIENT: Brooke, Mitchell  MR#: [id number] BIRTHDATE: 16-Oct-1950 , 69  yrs. old GENDER: female ENDOSCOPIST: Ladene Artist, MD, [facility] REFERRED [id number] Vernell Morgans, M.D. PROCEDURE DATE:  01/19/2015 PROCEDURE:   Colonoscopy, diagnostic First Screening Colonoscopy - Avg.  risk and is 50 yrs.  old or older - No.  Prior Negative Screening - Now for repeat screening. N/A  History of Adenoma - Now for follow-up colonoscopy & has been > or = to 3 yrs.  N/A  Polyps removed today? No Recommend repeat exam, <10 yrs? No ASA CLASS:   Class III INDICATIONS:iron deficiency anemia. MEDICATIONS: Monitored anesthesia care, Propofol 200 mg IV, and lidocaine 40 mg IV DESCRIPTION OF PROCEDURE:   After the risks benefits and alternatives of the procedure were thoroughly explained, informed consent was obtained.  The digital rectal exam revealed no abnormalities of the rectum.   The LB [id number]  endoscope was introduced through the anus and advanced to the cecum, which was identified by both the appendix and ileocecal valve. No adverse events experienced.   The quality of the prep was excellent. (Suprep was used)  The instrument was then slowly withdrawn as the colon was fully examined.    COLON FINDINGS: There was mild diverticulosis noted in the sigmoid colon.   The examination was otherwise normal.  Retroflexed views revealed internal Grade I hemorrhoids. The time to cecum = 3.2 Withdrawal time = 9.8. The scope was withdrawn and the procedure completed. COMPLICATIONS: There were no immediate complications.  ENDOSCOPIC IMPRESSION: 1.   Mild diverticulosis in the sigmoid colon 2.   Grade l internal hemorrhoids  RECOMMENDATIONS: 1.  High fiber diet with liberal fluid intake. 2.  Continue current colorectal screening recommendations for "routine risk" patients with a repeat colonoscopy in 10  years.  eSigned:  Ladene Artist, MD, [facility] 2015-01-19 73:53:29.924

## 2015-01-19 NOTE — Op Note (Signed)
[place]  [address]. [place], [address]   ENDOSCOPY PROCEDURE REPORT  PATIENT: Brooke Mitchell, Brooke Mitchell  MR#: [id number] BIRTHDATE: 1951/05/11 , 10  yrs. old GENDER: female ENDOSCOPIST: Ladene Artist, MD, [facility] REFERRED BY:  Abner Greenspan, M.D. PROCEDURE DATE:  01/19/2015 PROCEDURE:  EGD w/ biopsy ASA CLASS:     Class III INDICATIONS:  unexplained iron deficiency anemia and history of gastric polyps. MEDICATIONS: Monitored anesthesia care, Residual sedation present, and Propofol 150 mg IV TOPICAL ANESTHETIC: none DESCRIPTION OF PROCEDURE: After the risks benefits and alternatives of the procedure were thoroughly explained, informed consent was obtained.  The    endoscope was introduced through the mouth and advanced to the second portion of the duodenum , Without limitations.  The instrument was slowly withdrawn as the mucosa was fully examined.    STOMACH: Atrophic gastritis (thin folds) was found in the gastric fundus and gastric body.  Multiple biopsies were performed.   Mild gastritis (erythema) was found in the prepyloric region of the stomach.  Multiple biopsies were performed.   The stomach otherwise appeared normal. ESOPHAGUS: The mucosa of the esophagus appeared normal. DUODENUM: The duodenal mucosa showed no abnormalities in the bulb and 2nd part of the duodenum.  Retroflexed views revealed a small hiatal hernia .   The scope was then withdrawn from the patient and the procedure completed.  COMPLICATIONS: There were no immediate complications.  ENDOSCOPIC IMPRESSION: 1.   Atrophic gastritis in the gastric fundus and gastric body; multiple biopsies performed 2.   Mild gastritis in the prepyloric region of the stomach; multiple biopsies performed 3.   Small hiatal hernia 4.   The EGD otherwise appeared normal  RECOMMENDATIONS: 1,  Await pathology results 2.  Continue Fe and B12 replacement [R eSigned:  Ladene Artist, MD, [facility] 2015-01-19  35:36:14.431

## 2015-01-19 NOTE — Progress Notes (Signed)
Called to room to assist during endoscopic procedure.  Patient ID and intended procedure confirmed with present staff. Received instructions for my participation in the procedure from the performing physician.  

## 2015-01-19 NOTE — Progress Notes (Signed)
Stable to RR 

## 2015-01-19 NOTE — Patient Instructions (Signed)
YOU HAD AN ENDOSCOPIC PROCEDURE TODAY AT [facility]:   Refer to the procedure report that was given to you for any specific questions about what was found during the examination.  If the procedure report does not answer your questions, please call your gastroenterologist to clarify.  If you requested that your care partner not be given the details of your procedure findings, then the procedure report has been included in a sealed envelope for you to review at your convenience later.  YOU SHOULD EXPECT: Some feelings of bloating in the abdomen. Passage of more gas than usual.  Walking can help get rid of the air that was put into your GI tract during the procedure and reduce the bloating. If you had a lower endoscopy (such as a colonoscopy or flexible sigmoidoscopy) you may notice spotting of blood in your stool or on the toilet paper. If you underwent a bowel prep for your procedure, you may not have a normal bowel movement for a few days.  Please Note:  You might notice some irritation and congestion in your nose or some drainage.  This is from the oxygen used during your procedure.  There is no need for concern and it should clear up in a day or so.  SYMPTOMS TO REPORT IMMEDIATELY:   Following lower endoscopy (colonoscopy or flexible sigmoidoscopy):  Excessive amounts of blood in the stool  Significant tenderness or worsening of abdominal pains  Swelling of the abdomen that is new, acute  Fever of 100F or higher   Following upper endoscopy (EGD)  Vomiting of blood or coffee ground material  New chest pain or pain under the shoulder blades  Painful or persistently difficult swallowing  New shortness of breath  Fever of 100F or higher  Black, tarry-looking stools  For urgent or emergent issues, a gastroenterologist can be reached at any hour by calling [phone].   DIET: Your first meal following the procedure should be a small meal and then it is ok to progress to  your normal diet. Heavy or fried foods are harder to digest and may make you feel nauseous or bloated.  Likewise, meals heavy in dairy and vegetables can increase bloating.  Drink plenty of fluids but you should avoid alcoholic beverages for 24 hours. Try to increase your fluid intake and fiber in your diet.  ACTIVITY:  You should plan to take it easy for the rest of today and you should NOT DRIVE or use heavy machinery until tomorrow (because of the sedation medicines used during the test).    FOLLOW UP: Our staff will call the number listed on your records the next business day following your procedure to check on you and address any questions or concerns that you may have regarding the information given to you following your procedure. If we do not reach you, we will leave a message.  However, if you are feeling well and you are not experiencing any problems, there is no need to return our call.  We will assume that you have returned to your regular daily activities without incident.  If any biopsies were taken you will be contacted by phone or by letter within the next 1-3 weeks.  Please call us at [phone] if you have not heard about the biopsies in 3 weeks.    SIGNATURES/CONFIDENTIALITY: You and/or your care partner have signed paperwork which will be entered into your electronic medical record.  These signatures attest to the fact that that  the information above on your After Visit Summary has been reviewed and is understood.  Full responsibility of the confidentiality of this discharge information lies with you and/or your care-partner.  Read all of the handouts given to you by your recovery room nurse.

## 2015-01-20 ENCOUNTER — Telehealth: Payer: Self-pay

## 2015-01-20 NOTE — Telephone Encounter (Signed)
  Follow up Call-  Call back number 01/19/2015 01/22/2013  Post procedure Call Back phone  # [phone] cell [phone]  Permission to leave phone message Yes Yes     Patient questions:  Do you have a fever, pain , or abdominal swelling? No. Pain Score  0 *  Have you tolerated food without any problems? Yes.    Have you been able to return to your normal activities? Yes.    Do you have any questions about your discharge instructions: Diet   No. Medications  No. Follow up visit  No.  Do you have questions or concerns about your Care? No.  Actions: * If pain score is 4 or above: No action needed, pain <4.  No problems noted per the pt. maw

## 2015-01-23 ENCOUNTER — Encounter: Payer: Self-pay | Admitting: Pulmonary Disease

## 2015-01-23 ENCOUNTER — Telehealth: Payer: Self-pay | Admitting: *Deleted

## 2015-01-23 ENCOUNTER — Ambulatory Visit (INDEPENDENT_AMBULATORY_CARE_PROVIDER_SITE_OTHER): Payer: BC Managed Care – PPO | Admitting: Pulmonary Disease

## 2015-01-23 ENCOUNTER — Ambulatory Visit (INDEPENDENT_AMBULATORY_CARE_PROVIDER_SITE_OTHER): Payer: BC Managed Care – PPO | Admitting: *Deleted

## 2015-01-23 ENCOUNTER — Encounter (INDEPENDENT_AMBULATORY_CARE_PROVIDER_SITE_OTHER): Payer: Self-pay

## 2015-01-23 VITALS — BP 140/90 | HR 90 | Ht 64.25 in | Wt 206.4 lb

## 2015-01-23 DIAGNOSIS — E538 Deficiency of other specified B group vitamins: Secondary | ICD-10-CM | POA: Diagnosis not present

## 2015-01-23 DIAGNOSIS — G4730 Sleep apnea, unspecified: Secondary | ICD-10-CM | POA: Diagnosis not present

## 2015-01-23 DIAGNOSIS — G4733 Obstructive sleep apnea (adult) (pediatric): Secondary | ICD-10-CM

## 2015-01-23 DIAGNOSIS — G2581 Restless legs syndrome: Secondary | ICD-10-CM

## 2015-01-23 MED ORDER — CYANOCOBALAMIN 1000 MCG/ML IJ SOLN
1000.0000 ug | Freq: Once | INTRAMUSCULAR | Status: AC
  Administered 2015-01-23: 1000 ug via INTRAMUSCULAR

## 2015-01-23 NOTE — Patient Instructions (Signed)
You have severe obstructive sleep apnea , corrected by CPAP We will renew CPAP supplies & check download to ensure pressure OK For restless legs, continue iron supplements x 3 months then recheck serum ferritin Once above 50, if symptoms persist , call us & we can send rx for requip

## 2015-01-23 NOTE — Progress Notes (Signed)
Subjective:    Patient ID: Brooke Mitchell, female    DOB: Apr 21, 1951, 64 y.o.   MRN: [id number]  HPI  Chief Complaint  Patient presents with  . Sleep Consult    referred by Dr. Glori Bickers.  Currently wearing CPAP, not sleeping well, very fatigued, has leg pain at night.  Had sleep study done in 05/2005 at [facility]. Epworth Score: 38   64 year old with OSA for evaluation of non-refreshing sleep PSG 05/2005 showed severe OSA, AHI 66 per hour, lowest desat 78% corrected by CPAP 19 cm.. Labs were noted during the study. Since then she has been maintained on CPAP with a full face mask, she obtained a new machine in 2012-she feels this has been set at 17 cm. She has been reporting non-refreshing sleep and feeling fatigued-primary care evaluation showed low iron, low ferritin, low B-12 and vitamin D levels. Colonoscopy and EGD did not show any lesions. She is on iron supplementation. She reports creepy crawly sensations in her legs for many years worse over the past few months. She has a family history of restless leg syndrome in her brother and father Epworth sleepiness score is 7 Bedtime is 11 PM to midnight, sleep latency about 20 minutes, he sleeps on her side with one pillow, reports 2-3 nocturnal awakenings without any post void sleep latency and is out of bed by 8 AM feeling tired with occasional dryness of mouth, denies headache, Her weight has been constant over the past 10 years She is a retired Advertising copywriter There is no history suggestive of cataplexy, sleep paralysis or parasomnias   Past Medical History  Diagnosis Date  . Allergic rhinitis   . Asthma     As a child   . GERD (gastroesophageal reflux disease)   . Hyperlipidemia   . Hypertension   . Hypothyroidism   . Diabetes mellitus     Type II (2/06 elevated microalbumin)  . Obesity   . OSA (obstructive sleep apnea)     CPAP  . Frequent UTI     with coital prohylaxis   . Anemia   . Allergy   . Sleep  apnea     wears c-pap  . Anxiety   . Arthritis     osteoarthritis  . Depression     Past Surgical History  Procedure Laterality Date  . Foot surgery    . Endometrial biopsy    . Dilation and curettage of uterus    . Colonoscopy    . Upper gastrointestinal endoscopy      No Known Allergies  History   Social History  . Marital Status: Married    Spouse Name: N/A  . Number of Children: 2  . Years of Education: N/A   Occupational History  . Pharmacist, hospital   .     Social History Main Topics  . Smoking status: Never Smoker   . Smokeless tobacco: Never Used  . Alcohol Use: 0.0 oz/week    0 Standard drinks or equivalent per week     Comment: occasional  . Drug Use: No  . Sexual Activity: Not on file   Other Topics Concern  . Not on file   Social History Narrative   Some walking for exercise   Widow 11/2013- Cared for husband full time with ALS at home     Family History  Problem Relation Age of Onset  . Diabetes Mother   . Coronary artery disease Mother   . Hypothyroidism Mother   .  Irritable bowel syndrome Mother   . Alzheimer's disease Father   . Nephrolithiasis Father   . Hypothyroidism Brother   . Colon cancer Neg Hx   . Esophageal cancer Neg Hx   . Rectal cancer Neg Hx   . Stomach cancer Neg Hx      Review of Systems  Constitutional: Negative for fever and unexpected weight change.  HENT: Negative for congestion, dental problem, ear pain, nosebleeds, postnasal drip, rhinorrhea, sinus pressure, sneezing, sore throat and trouble swallowing.   Eyes: Negative for redness and itching.  Respiratory: Negative for cough, chest tightness, shortness of breath and wheezing.   Cardiovascular: Negative for palpitations and leg swelling.  Gastrointestinal: Negative for nausea and vomiting.  Genitourinary: Negative for dysuria.  Musculoskeletal: Negative for joint swelling.  Skin: Negative for rash.  Neurological: Negative for headaches.  Hematological: Does not  bruise/bleed easily.  Psychiatric/Behavioral: Negative for dysphoric mood. The patient is not nervous/anxious.        Objective:   Physical Exam  Gen. Pleasant, obese, in no distress, normal affect ENT - no lesions, no post nasal drip, class 2-3 airway Neck: No JVD, no thyromegaly, no carotid bruits Lungs: no use of accessory muscles, no dullness to percussion, decreased without rales or rhonchi  Cardiovascular: Rhythm regular, heart sounds  normal, no murmurs or gallops, no peripheral edema Abdomen: soft and non-tender, no hepatosplenomegaly, BS normal. Musculoskeletal: No deformities, no cyanosis or clubbing Neuro:  alert, non focal, no tremors       Assessment & Plan:

## 2015-01-23 NOTE — Assessment & Plan Note (Signed)
severe obstructive sleep apnea , corrected by CPAP We will renew CPAP supplies & check download to ensure pressure OK Weight loss encouraged, compliance with goal of at least 4-6 hrs every night is the expectation. Advised against medications with sedative side effects Cautioned against driving when sleepy - understanding that sleepiness will vary on a day to day basis

## 2015-01-23 NOTE — Assessment & Plan Note (Signed)
For restless legs, continue iron supplements x 3 months then recheck serum ferritin Once above 50, if symptoms persist , call us & we can send rx for requip

## 2015-01-23 NOTE — Telephone Encounter (Addendum)
FYI, pt was here for her b12 inj, but she wanted to inform you that she has done everything you both had discussed. She has had an endoscopy, and colonoscopy. She saw pulmonologist today, and states that they're just waiting for her iron to increase. She doesn't need c/b.

## 2015-01-26 ENCOUNTER — Encounter: Payer: Self-pay | Admitting: Gastroenterology

## 2015-01-26 NOTE — Telephone Encounter (Signed)
Thanks for the heads up - I do not think she has her next lab appt yet -please schedule lab in early June for cbc with diff and ferritin and B12 level for anemia  Thanks

## 2015-01-27 ENCOUNTER — Encounter: Payer: Self-pay | Admitting: Gastroenterology

## 2015-01-27 ENCOUNTER — Telehealth: Payer: Self-pay

## 2015-01-27 MED ORDER — BIS SUBCIT-METRONID-TETRACYC 140-125-125 MG PO CAPS: 3.0000 | ORAL_CAPSULE | Freq: Three times a day (TID) | ORAL | Status: DC

## 2015-01-27 NOTE — Telephone Encounter (Signed)
-----   Message from Ladene Artist, MD sent at 01/27/2015  8:43 AM EDT ----- Barbera Setters, please notify pt about H pylori Pylera 3 po qid for 10 days PPI bid for 10 days     ----- Message -----    From: Beatris Si, RN    Sent: 01/26/2015   4:39 PM      To: Ladene Artist, MD  Dr. Fuller Plan,    Just want to verify that we do not want to treat positive h. Pylori in the gastric body bx?   Jerilee Field, RN

## 2015-01-27 NOTE — Telephone Encounter (Signed)
Patient notified of the results Pylera rx sent, she is also asked to get omeprazole OTC BID for 10 days I explained that she should go on line and download the coupon and that she should avoid all alcohol while taking Pylera, that it may turn her stools black, and to take it with food I explained that it is important to finish all of the medication She will call back for any additional questions or concerns

## 2015-01-27 NOTE — Telephone Encounter (Signed)
Pt coming to see Dr. Lorelei Pont on 02/18/15, lab appt scheduled before his appt to have labs recheck

## 2015-01-28 ENCOUNTER — Other Ambulatory Visit: Payer: Self-pay | Admitting: Family Medicine

## 2015-01-28 NOTE — Telephone Encounter (Signed)
Last office visit 12/24/2014 with Dr. Lorelei Pont.  Last refilled 04/21/2014 for #90 with 1 refill.  Ok to refill?

## 2015-01-28 NOTE — Telephone Encounter (Signed)
Please refill for 6 mo 

## 2015-01-29 NOTE — Telephone Encounter (Signed)
done

## 2015-02-07 ENCOUNTER — Other Ambulatory Visit: Payer: Self-pay | Admitting: Family Medicine

## 2015-02-09 ENCOUNTER — Other Ambulatory Visit: Payer: Self-pay | Admitting: Family Medicine

## 2015-02-09 DIAGNOSIS — D508 Other iron deficiency anemias: Secondary | ICD-10-CM

## 2015-02-12 ENCOUNTER — Telehealth: Payer: Self-pay | Admitting: Pulmonary Disease

## 2015-02-12 DIAGNOSIS — G4733 Obstructive sleep apnea (adult) (pediatric): Secondary | ICD-10-CM

## 2015-02-12 NOTE — Telephone Encounter (Signed)
On 17cm No residuals Good usage AHI: 4.1

## 2015-02-12 NOTE — Telephone Encounter (Signed)
Patient notified of results.  Requesting new CPAP mask, tubing and water concentrator.  Order entered for new supplies.  Nothing further needed.

## 2015-02-14 ENCOUNTER — Other Ambulatory Visit: Payer: Self-pay | Admitting: Family Medicine

## 2015-02-16 ENCOUNTER — Ambulatory Visit: Payer: BC Managed Care – PPO | Admitting: Gastroenterology

## 2015-02-18 ENCOUNTER — Other Ambulatory Visit (INDEPENDENT_AMBULATORY_CARE_PROVIDER_SITE_OTHER): Payer: BC Managed Care – PPO

## 2015-02-18 ENCOUNTER — Ambulatory Visit: Payer: BC Managed Care – PPO | Admitting: Family Medicine

## 2015-02-18 ENCOUNTER — Encounter: Payer: Self-pay | Admitting: Family Medicine

## 2015-02-18 ENCOUNTER — Ambulatory Visit (INDEPENDENT_AMBULATORY_CARE_PROVIDER_SITE_OTHER): Payer: BC Managed Care – PPO | Admitting: Family Medicine

## 2015-02-18 VITALS — BP 126/70 | HR 100 | Temp 98.4°F | Ht 64.5 in | Wt 209.8 lb

## 2015-02-18 DIAGNOSIS — M778 Other enthesopathies, not elsewhere classified: Secondary | ICD-10-CM

## 2015-02-18 DIAGNOSIS — D508 Other iron deficiency anemias: Secondary | ICD-10-CM | POA: Diagnosis not present

## 2015-02-18 DIAGNOSIS — M7582 Other shoulder lesions, left shoulder: Secondary | ICD-10-CM

## 2015-02-18 LAB — CBC WITH DIFFERENTIAL/PLATELET
Basophils Absolute: 0 10*3/uL (ref 0.0–0.1)
Basophils Relative: 0.5 % (ref 0.0–3.0)
EOS PCT: 1.8 % (ref 0.0–5.0)
Eosinophils Absolute: 0.1 10*3/uL (ref 0.0–0.7)
HEMATOCRIT: 29.9 % — AB (ref 36.0–46.0)
Hemoglobin: 9.2 g/dL — ABNORMAL LOW (ref 12.0–15.0)
LYMPHS PCT: 23.9 % (ref 12.0–46.0)
Lymphs Abs: 1.4 10*3/uL (ref 0.7–4.0)
MCHC: 30.7 g/dL (ref 30.0–36.0)
MCV: 69.5 fl — ABNORMAL LOW (ref 78.0–100.0)
Monocytes Absolute: 0.4 10*3/uL (ref 0.1–1.0)
Monocytes Relative: 7.4 % (ref 3.0–12.0)
NEUTROS PCT: 66.4 % (ref 43.0–77.0)
Neutro Abs: 3.9 10*3/uL (ref 1.4–7.7)
PLATELETS: 321 10*3/uL (ref 150.0–400.0)
RBC: 4.31 Mil/uL (ref 3.87–5.11)
RDW: 18.7 % — ABNORMAL HIGH (ref 11.5–15.5)
WBC: 5.9 10*3/uL (ref 4.0–10.5)

## 2015-02-18 LAB — FERRITIN: FERRITIN: 3 ng/mL — AB (ref 10.0–291.0)

## 2015-02-18 LAB — VITAMIN B12: VITAMIN B 12: 221 pg/mL (ref 211–911)

## 2015-02-18 NOTE — Progress Notes (Signed)
Dr. Frederico Hamman T. Callia Swim, MD, [facility] [place], [address] Phone: [phone] Fax: [phone]  02/18/2015  Patient: Brooke Mitchell, MRN: [id number], DOB: 1951/04/16, 64 y.o.  Primary Physician:  Loura Pardon, MD  Chief Complaint: Follow-up  Subjective:    L shoulder: PT for 5 weeks. Has been doing some water aerobics.  Using noodle.  Doing some   She is doing great.  She has been very compliant with her home exercise program, she has also been doing formal physical therapy.  She also has been doing water aerobics, which she thinks is been helping a whole lot.  Very satisfied with her progress.  She still does have some occasional symptoms.  12/24/2014 Last OV with Owens Loffler, MD  This 64 y.o. female patient noted above presents with shoulder pain that has been ongoing for 2 mo. there is no history of trauma or accident recently The patient denies neck pain or radicular symptoms. Denies dislocation, subluxation, separation of the shoulder. The patient does complain of pain in the overhead plane with significant painful arc of motion.  L shoulder: slowly came on the left. Last month, and last few days has been improved.  No sports.  Medications Tried: Tylenol, NSAIDS Ice or Heat: minimally helpful Tried PT: No  Prior shoulder Injury: None on L Prior surgery: No Prior fracture: No  The PMH, PSH, Social History, Family History, Medications, and allergies have been reviewed in [facility], and have been updated if relevant.  GEN: No fevers, chills. Nontoxic. Primarily MSK c/o today. MSK: Detailed in the HPI GI: tolerating PO intake without difficulty Neuro: No numbness, parasthesias, or tingling associated. Otherwise the pertinent positives of the ROS are noted above.   Objective:   Blood pressure 126/70, pulse 100, temperature 98.4 F (36.9 C), temperature source Oral, height 5' 4.5" (1.638 m), weight 209 lb  12 oz (95.142 kg).  GEN: Well-developed,well-nourished,in no acute distress; alert,appropriate and cooperative throughout examination HEENT: Normocephalic and atraumatic without obvious abnormalities. Ears, externally no deformities PULM: Breathing comfortably in no respiratory distress EXT: No clubbing, cyanosis, or edema PSYCH: Normally interactive. Cooperative during the interview. Pleasant. Friendly and conversant. Not anxious or depressed appearing. Normal, full affect.  Shoulder: LEFT Inspection: No muscle wasting or winging Ecchymosis/edema: neg  AC joint, scapula, clavicle: NT Cervical spine: NT, full ROM Spurling's: neg Abduction: full, 5/5 Flexion: full, 5/5 IR, full, lift-off: 5/5 ER at neutral: full, 5/5 AC crossover: neg Neer: neg Hawkins: minimal pos Drop Test: neg Empty Can: neg Supraspinatus insertion: nt Bicipital groove: TTP Speed's: neg Yergason's: neg Sulcus sign: neg Scapular dyskinesis: none C5-T1 intact  Neuro: Sensation intact Grip 5/5   Radiology: No results found.  Assessment and Plan:    Rotator cuff tendonitis, left  Subacromial tendinitis, left  Doing great, cont HEP and pool rehab  Follow-up: prn  Signed,  Bruin Bolger T. Faduma Cho, MD   Patient's Medications  New Prescriptions   No medications on file  Previous Medications   ACETAMINOPHEN (TYLENOL) 325 MG TABLET    Take 650 mg by mouth as needed for pain.   ADVAIR DISKUS 100-50 MCG/DOSE AEPB    USE 1 PUFF TWICE DAILY DURING ALLERGY SEASON   ALBUTEROL (PROVENTIL,VENTOLIN) 90 MCG/ACT INHALER    Inhale 2 puffs into the lungs every 4 (four) hours as needed.     ASPIRIN 81 MG TABLET    Take 81 mg by mouth daily.     ATORVASTATIN (  LIPITOR) 20 MG TABLET    TAKE 1/2 TABLET (10 MG TOTAL) BY MOUTH DAILY.   BISMUTH-METRONIDAZOLE-TETRACYCLINE (PYLERA) 140-125-125 MG PER CAPSULE    Take 3 capsules by mouth 4 (four) times daily -  before meals and at bedtime.   CALCIUM CARBONATE (CALCIUM 600)  600 MG TABS    Take 600 mg by mouth 2 (two) times daily with a meal.     CHOLECALCIFEROL 4000 UNITS CAPS    Take 4,000 Units by mouth daily. Pt takes 2 tablets of 2,000 units per day = 4,000 units a day   CYANOCOBALAMIN (,VITAMIN B-12,) 1000 MCG/ML INJECTION    Inject 1,000 mcg into the muscle. Once a month   CYANOCOBALAMIN (VITAMIN B-12 PO)    Take 6 mcg by mouth daily.   FLUOXETINE (PROZAC) 20 MG CAPSULE    TAKE 1 CAPSULE BY MOUTH DAILY.   FOSINOPRIL-HYDROCHLOROTHIAZIDE (MONOPRIL-HCT) 10-12.5 MG PER TABLET    TAKE 1 TABLET BY MOUTH DAILY.   GLIPIZIDE XL 10 MG 24 HR TABLET    TAKE 1 TABLET (10 MG TOTAL) BY MOUTH DAILY.   HYOSCYAMINE (LEVSIN SL) 0.125 MG SL TABLET    Place one tablet under tongue once a day as needed.   IRON, FERROUS GLUCONATE, PO    Take 1 tablet by mouth daily.    LORATADINE (CLARITIN) 10 MG TABLET    Take 10 mg by mouth daily.     MELOXICAM (MOBIC) 15 MG TABLET    TAKE 1 TABLET BY MOUTH DAILY AS NEEDED WITH FOOD   METFORMIN (GLUCOPHAGE) 1000 MG TABLET    TAKE 1 TABLET (1,000 MG TOTAL) BY MOUTH 2 (TWO) TIMES DAILY WITH A MEAL.   MULTIVITAMIN (THERAGRAN) PER TABLET    Take 1 tablet by mouth daily.     ONE TOUCH ULTRA TEST TEST STRIP    CHECK SUGAR TWICE A DAY AS INSTRUCTED (DM 250.0) THAT IS NOT OPTIMALLYCONTROLLED   PROAIR HFA 108 (90 BASE) MCG/ACT INHALER    USE 2 PUFFS UPTO EVERY 4 HOURS AS NEEDED FOR WHEEZING   SYNTHROID 50 MCG TABLET    TAKE 1 TABLET (50 MCG TOTAL) BY MOUTH DAILY.  Modified Medications   No medications on file  Discontinued Medications   No medications on file

## 2015-02-18 NOTE — Progress Notes (Signed)
Pre visit review using our clinic review tool, if applicable. No additional management support is needed unless otherwise documented below in the visit note. 

## 2015-02-19 ENCOUNTER — Telehealth: Payer: Self-pay | Admitting: Family Medicine

## 2015-02-19 DIAGNOSIS — D509 Iron deficiency anemia, unspecified: Secondary | ICD-10-CM | POA: Insufficient documentation

## 2015-02-19 NOTE — Telephone Encounter (Signed)
Hematology ref routed to [facility]

## 2015-02-19 NOTE — Telephone Encounter (Signed)
-----   Message from Tammi Sou, [place] sent at 02/19/2015 11:19 AM EDT ----- Pt notified of labs and Dr. Marliss Coots recommendations. Pt agrees with referral to hematology, please put referral in. Pt will increase iron to BID and she is already scheduled to have a b12 inj Tuesday so when she gets here she will schedule 3 additional injections (once a week)

## 2015-02-20 ENCOUNTER — Ambulatory Visit: Payer: BC Managed Care – PPO | Admitting: Gastroenterology

## 2015-02-24 ENCOUNTER — Encounter: Payer: Self-pay | Admitting: Pulmonary Disease

## 2015-02-27 ENCOUNTER — Other Ambulatory Visit: Payer: Self-pay | Admitting: Family Medicine

## 2015-03-03 ENCOUNTER — Ambulatory Visit (INDEPENDENT_AMBULATORY_CARE_PROVIDER_SITE_OTHER): Payer: BC Managed Care – PPO | Admitting: *Deleted

## 2015-03-03 DIAGNOSIS — E538 Deficiency of other specified B group vitamins: Secondary | ICD-10-CM

## 2015-03-03 MED ORDER — CYANOCOBALAMIN 1000 MCG/ML IJ SOLN
1000.0000 ug | Freq: Once | INTRAMUSCULAR | Status: AC
  Administered 2015-03-03: 1000 ug via INTRAMUSCULAR

## 2015-03-11 ENCOUNTER — Encounter: Payer: Self-pay | Admitting: Internal Medicine

## 2015-03-11 ENCOUNTER — Encounter (INDEPENDENT_AMBULATORY_CARE_PROVIDER_SITE_OTHER): Payer: Self-pay

## 2015-03-11 ENCOUNTER — Inpatient Hospital Stay: Payer: BC Managed Care – PPO | Attending: Internal Medicine | Admitting: Internal Medicine

## 2015-03-11 VITALS — BP 134/83 | HR 99 | Temp 99.7°F | Ht 64.5 in | Wt 208.3 lb

## 2015-03-11 DIAGNOSIS — K219 Gastro-esophageal reflux disease without esophagitis: Secondary | ICD-10-CM | POA: Insufficient documentation

## 2015-03-11 DIAGNOSIS — Z79899 Other long term (current) drug therapy: Secondary | ICD-10-CM | POA: Diagnosis not present

## 2015-03-11 DIAGNOSIS — I10 Essential (primary) hypertension: Secondary | ICD-10-CM | POA: Insufficient documentation

## 2015-03-11 DIAGNOSIS — E785 Hyperlipidemia, unspecified: Secondary | ICD-10-CM

## 2015-03-11 DIAGNOSIS — Z7982 Long term (current) use of aspirin: Secondary | ICD-10-CM | POA: Insufficient documentation

## 2015-03-11 DIAGNOSIS — D509 Iron deficiency anemia, unspecified: Secondary | ICD-10-CM | POA: Insufficient documentation

## 2015-03-11 DIAGNOSIS — R5383 Other fatigue: Secondary | ICD-10-CM | POA: Diagnosis not present

## 2015-03-11 DIAGNOSIS — F418 Other specified anxiety disorders: Secondary | ICD-10-CM | POA: Insufficient documentation

## 2015-03-11 DIAGNOSIS — K5730 Diverticulosis of large intestine without perforation or abscess without bleeding: Secondary | ICD-10-CM | POA: Diagnosis not present

## 2015-03-11 DIAGNOSIS — E039 Hypothyroidism, unspecified: Secondary | ICD-10-CM | POA: Insufficient documentation

## 2015-03-11 DIAGNOSIS — E538 Deficiency of other specified B group vitamins: Secondary | ICD-10-CM | POA: Diagnosis not present

## 2015-03-11 DIAGNOSIS — E119 Type 2 diabetes mellitus without complications: Secondary | ICD-10-CM | POA: Insufficient documentation

## 2015-03-11 DIAGNOSIS — M1990 Unspecified osteoarthritis, unspecified site: Secondary | ICD-10-CM

## 2015-03-12 ENCOUNTER — Ambulatory Visit (INDEPENDENT_AMBULATORY_CARE_PROVIDER_SITE_OTHER): Payer: BC Managed Care – PPO

## 2015-03-12 DIAGNOSIS — E538 Deficiency of other specified B group vitamins: Secondary | ICD-10-CM | POA: Diagnosis not present

## 2015-03-12 MED ORDER — CYANOCOBALAMIN 1000 MCG/ML IJ SOLN
1000.0000 ug | Freq: Once | INTRAMUSCULAR | Status: AC
  Administered 2015-03-12: 1000 ug via INTRAMUSCULAR

## 2015-03-18 NOTE — Progress Notes (Signed)
[place]  [phone] [phone]     ID: Brooke Mitchell OB: [birth year]  MR#: [id number]  KPT#:[id number]  Patient Care Team: Abner Greenspan, MD as PCP - General Kathee Delton, MD as Attending Physician (Pulmonary Disease)  CHIEF COMPLAINT/DIAGNOSIS:  Persistent Anemia with history of B-12 deficiency and iron deficiency (diagnosed with B-12 deficiency in March 2016 and started on B-12 injections. On 02/08/15, labs showed B12 was 221, ferritin was low at 3, hemoglobin 9.2, platelets 321, WBC 5900, ANC 3900)  -  patient referred here for Hematology evaluation and management.   HISTORY OF PRESENT ILLNESS:  Brooke Mitchell is a 64 year old female with past medical history significant for hypertension, diabetes mellitus, hypothyroidism, hyperlipidemia, GERD, asthma, allergies, obesity, obstructive sleep apnea on CPAP, history of UTI, anxiety/depression, osteoporosis, who has been referred here for persistent anemia with fatigability. In April 2016 hemoglobin was 9.1. Patient states that she was diagnosed with B-12 deficiency in March 2016 and started on B-12 injections. On 02/08/2015, labs showed B-12 was 221, ferritin was low at 3, hemoglobin 9.2, platelets 321, WBC 5900, ANC 3900. On 01/19/2015, she had EGD which reported atrophic gastritis H. pylori positive, small hiatal hernia and colonoscopy which reported mild diverticulosis sigmoid colon and grade 1 internal hemorrhoids. Clinical history is that she does have easy fatigue on exertion. Otherwise denies any dyspnea, fatigue at rest, orthopnea, PND, angina or palpitation. She denies any obvious bleeding symptoms. Appetite is fairly steady, no unintentional weight loss. No new bone pains.  REVIEW OF SYSTEMS:   ROS CONSTITUTIONAL: As in HPI above. No chills, fever or sweats.    ENT:  No headache, dizziness or epistaxis. No ear or jaw pain. No sinus symptoms. RESPIRATORY:  History of allergies, asthma.  Chronic cough. History of sleep apnea. Otherwise denies any dyspnea, wheezing. No hemoptysis. CARDIAC:  No palpitations.  No retrosternal chest pain. No orthopnea, PND. GI:  No abdominal pain, nausea or vomiting. No diarrhea. History of hemorrhoids.   GU:  No dysuria or hematuria.  SKIN: No rashes or pruritus. HEMATOLOGIC: Easy skin bruising. Otherwise denies bleeding symptoms MUSCULOSKELETAL: Chronic arthritis/osteoporosis in the left shoulder. Denies new bone pains.  EXTREMITY:  No new swelling or pain.  NEURO:  No focal weakness. No numbness or tingling of extremities. No seizures.   ENDOCRINE:  No polyuria or polydipsia.   PAST MEDICAL HISTORY: Reviewed. Past Medical History  Diagnosis Date  . Allergic rhinitis   . Asthma     As a child   . GERD (gastroesophageal reflux disease)   . Hyperlipidemia   . Hypertension   . Hypothyroidism   . Diabetes mellitus     Type II (2/06 elevated microalbumin)  . Obesity   . OSA (obstructive sleep apnea)     CPAP  . Frequent UTI     with coital prohylaxis   . Anemia   . Allergy   . Sleep apnea     wears c-pap  . Anxiety   . Arthritis     osteoarthritis  . Depression   01/19/15 - EGD which reported atrophic gastritis H. pylori positive, small hiatal hernia and Colonoscopy reported mild diverticulosis sigmoid colon and grade 1 internal hemorrhoids.  PAST SURGICAL HISTORY: Reviewed. Past Surgical History  Procedure Laterality Date  . Foot surgery    . Endometrial biopsy    . Dilation and curettage of uterus    . Colonoscopy    . Upper gastrointestinal endoscopy  FAMILY HISTORY: Reviewed. Family History  Problem Relation Age of Onset  . Diabetes Mother   . Coronary artery disease Mother   . Hypothyroidism Mother   . Irritable bowel syndrome Mother   . Alzheimer's disease Father   . Nephrolithiasis Father   . Hypothyroidism Brother   . Colon cancer Neg Hx   . Esophageal cancer Neg Hx   . Rectal cancer Neg Hx   .  Stomach cancer Neg Hx     SOCIAL HISTORY: Reviewed. History  Substance Use Topics  . Smoking status: Never Smoker   . Smokeless tobacco: Never Used  . Alcohol Use: 0.0 oz/week    0 Standard drinks or equivalent per week     Comment: occasional    No Known Allergies  Current Outpatient Prescriptions  Medication Sig Dispense Refill  . acetaminophen (TYLENOL) 325 MG tablet Take 650 mg by mouth as needed for pain.    Marland Kitchen ADVAIR DISKUS 100-50 MCG/DOSE AEPB USE 1 PUFF TWICE DAILY DURING ALLERGY SEASON 60 each 1  . albuterol (PROVENTIL,VENTOLIN) 90 MCG/ACT inhaler Inhale 2 puffs into the lungs every 4 (four) hours as needed.      Marland Kitchen aspirin 81 MG tablet Take 81 mg by mouth daily.      Marland Kitchen atorvastatin (LIPITOR) 20 MG tablet TAKE 1/2 TABLET (10 MG TOTAL) BY MOUTH DAILY. 15 tablet 5  . calcium carbonate (CALCIUM 600) 600 MG TABS Take 600 mg by mouth 2 (two) times daily with a meal.      . Cholecalciferol 4000 UNITS CAPS Take 4,000 Units by mouth daily. Pt takes 2 tablets of 2,000 units per day = 4,000 units a day    . cyanocobalamin (,VITAMIN B-12,) 1000 MCG/ML injection Inject 1,000 mcg into the muscle. Once a month    . Cyanocobalamin (VITAMIN B-12 PO) Take 6 mcg by mouth daily.    Marland Kitchen FLUoxetine (PROZAC) 20 MG capsule TAKE 1 CAPSULE BY MOUTH DAILY. 90 capsule 3  . fosinopril-hydrochlorothiazide (MONOPRIL-HCT) 10-12.5 MG per tablet TAKE 1 TABLET BY MOUTH DAILY. 30 tablet 5  . GLIPIZIDE XL 10 MG 24 hr tablet TAKE 1 TABLET (10 MG TOTAL) BY MOUTH DAILY. 30 tablet 4  . hyoscyamine (LEVSIN SL) 0.125 MG SL tablet Place one tablet under tongue once a day as needed. 30 tablet 1  . IRON, FERROUS GLUCONATE, PO Take 1 tablet by mouth 2 (two) times daily.     Marland Kitchen loratadine (CLARITIN) 10 MG tablet Take 10 mg by mouth daily.      . meloxicam (MOBIC) 15 MG tablet TAKE 1 TABLET BY MOUTH DAILY AS NEEDED WITH FOOD 90 tablet 1  . metFORMIN (GLUCOPHAGE) 1000 MG tablet TAKE 1 TABLET (1,000 MG TOTAL) BY MOUTH 2 (TWO)  TIMES DAILY WITH A MEAL. 60 tablet 5  . multivitamin (THERAGRAN) per tablet Take 1 tablet by mouth daily.      . ONE TOUCH ULTRA TEST test strip CHECK SUGAR TWICE A DAY AS INSTRUCTED (DM 250.0) THAT IS NOT OPTIMALLYCONTROLLED 100 each 0  . PROAIR HFA 108 (90 BASE) MCG/ACT inhaler USE 2 PUFFS UPTO EVERY 4 HOURS AS NEEDED FOR WHEEZING 8.5 g 1  . SYNTHROID 50 MCG tablet TAKE 1 TABLET (50 MCG TOTAL) BY MOUTH DAILY. 30 tablet 5  . bismuth-metronidazole-tetracycline (PYLERA) 140-125-125 MG per capsule Take 3 capsules by mouth 4 (four) times daily -  before meals and at bedtime. (Patient not taking: Reported on 03/11/2015) 120 capsule 0   No current facility-administered medications for this  visit.    PHYSICAL EXAM: Filed Vitals:   03/11/15 1508  BP: 134/83  Pulse: 99  Temp: 99.7 F (37.6 C)     Body mass index is 35.22 kg/(m^2).     GENERAL: Patient is alert and oriented and in no acute distress. There is no icterus. Mild pallor. HEENT: EOMs intact. No cervical lymphadenopathy. CVS: S1S2, regular LUNGS: Bilaterally clear to auscultation, no rhonchi. ABDOMEN: Soft, nontender. No hepatosplenomegaly clinically.  NEURO: grossly nonfocal, cranial nerves are intact.  EXTREMITIES: No pedal edema. LYMPHATICS: No palpable adenopathy in axillary or inguinal areas.  SKIN: No rash. No major bruising. MUSCULOSKELETAL: No obvious joint redness or swelling   LAB RESULTS:    Component Value Date/Time   NA 138 11/26/2014 1704   K 4.0 11/26/2014 1704   CL 103 11/26/2014 1704   CO2 27 11/26/2014 1704   GLUCOSE 103* 11/26/2014 1704   BUN 14 11/26/2014 1704   CREATININE 0.77 11/26/2014 1704   CREATININE 0.78 02/08/2013 1429   CALCIUM 9.4 11/26/2014 1704   PROT 7.2 11/26/2014 1704   ALBUMIN 4.0 11/26/2014 1704   AST 19 11/26/2014 1704   ALT 13 11/26/2014 1704   ALKPHOS 53 11/26/2014 1704   BILITOT 0.4 11/26/2014 1704   GFRNONAA 84.03 03/16/2010 0818   GFRAA 95 09/15/2008 0846   Lab Results    Component Value Date   WBC 5.9 02/18/2015   NEUTROABS 3.9 02/18/2015   HGB 9.2* 02/18/2015   HCT 29.9* 02/18/2015   MCV 69.5 Repeated and verified X2.* 02/18/2015   PLT 321.0 02/18/2015     STUDIES: 01/19/15 - EGD which reported atrophic gastritis H. pylori positive, small hiatal hernia and Colonoscopy reported mild diverticulosis sigmoid colon and grade 1 internal hemorrhoids. Stool Hemoccult-negative   ASSESSMENT / PLAN:   Persistent Anemia with history of B-12 deficiency and iron deficiency  (diagnosed with B-12 deficiency in March 2016 and started on B-12 injections. On 02/08/15, labs showed B12 was 221, ferritin was low at 3, hemoglobin 9.2, platelets 321, WBC 5900, ANC 3900) -  patient referred here for Hematology evaluation and management. Have reviewed records sent by different physicians including recent labs and discussed with patient in detail. She has anemia with fatigability, but recent workup revealed 2 obvious etiologies including B-12 deficiency for which she is on B-12 injections and iron deficiency. Since March, patient is taking one iron tablet daily, for the last 3 weeks he has increased it to twice daily. Patient states that she did take oral iron for many months about 2 years ago and did not have major side effects. Have discussed further options for iron therapy including increasing to optimize the dose versus pursuing parenteral iron therapy. Patient prefers first option, plan is to try ferrous sulfate 325 mg by mouth twice daily with meals for one week and if no major constipation other side effects then increase it to 3 times daily with meals. Will monitor lab with CBC and iron study (ferritin/iron/TIBC) at 6 weeks. Will see her back at 12 weeks with repeat labs. Will also check intrinsic factor antibody when we draw labs here in 6 weeks to make sure she does not have pernicious anemia. Patient also advised to notify us if she cannot tolerate oral iron in which case we  may need to pursue parenteral iron therapy.     In between visits, the patient has been advised to call or come to the ER in case of progressive anemia symptoms, acute sickness, or new symptoms.  Patient is agreeable to this plan.     Leia Alf, MD   03/18/2015 8:00 PM

## 2015-03-19 ENCOUNTER — Ambulatory Visit (INDEPENDENT_AMBULATORY_CARE_PROVIDER_SITE_OTHER): Payer: BC Managed Care – PPO | Admitting: *Deleted

## 2015-03-19 DIAGNOSIS — E538 Deficiency of other specified B group vitamins: Secondary | ICD-10-CM

## 2015-03-19 MED ORDER — CYANOCOBALAMIN 1000 MCG/ML IJ SOLN
1000.0000 ug | Freq: Once | INTRAMUSCULAR | Status: AC
  Administered 2015-03-19: 1000 ug via INTRAMUSCULAR

## 2015-03-26 ENCOUNTER — Ambulatory Visit (INDEPENDENT_AMBULATORY_CARE_PROVIDER_SITE_OTHER): Payer: BC Managed Care – PPO

## 2015-03-26 DIAGNOSIS — E538 Deficiency of other specified B group vitamins: Secondary | ICD-10-CM

## 2015-03-26 MED ORDER — CYANOCOBALAMIN 1000 MCG/ML IJ SOLN
1000.0000 ug | Freq: Once | INTRAMUSCULAR | Status: AC
  Administered 2015-03-26: 1000 ug via INTRAMUSCULAR

## 2015-04-02 ENCOUNTER — Ambulatory Visit (INDEPENDENT_AMBULATORY_CARE_PROVIDER_SITE_OTHER): Payer: BC Managed Care – PPO

## 2015-04-02 DIAGNOSIS — E538 Deficiency of other specified B group vitamins: Secondary | ICD-10-CM | POA: Diagnosis not present

## 2015-04-02 MED ORDER — CYANOCOBALAMIN 1000 MCG/ML IJ SOLN
1000.0000 ug | Freq: Once | INTRAMUSCULAR | Status: AC
  Administered 2015-04-02: 1000 ug via INTRAMUSCULAR

## 2015-04-22 ENCOUNTER — Inpatient Hospital Stay: Payer: BC Managed Care – PPO | Attending: Internal Medicine

## 2015-04-22 DIAGNOSIS — D509 Iron deficiency anemia, unspecified: Secondary | ICD-10-CM | POA: Insufficient documentation

## 2015-04-22 DIAGNOSIS — E538 Deficiency of other specified B group vitamins: Secondary | ICD-10-CM | POA: Insufficient documentation

## 2015-04-25 ENCOUNTER — Other Ambulatory Visit: Payer: Self-pay | Admitting: Family Medicine

## 2015-04-28 ENCOUNTER — Inpatient Hospital Stay: Payer: BC Managed Care – PPO

## 2015-04-28 DIAGNOSIS — E538 Deficiency of other specified B group vitamins: Secondary | ICD-10-CM | POA: Diagnosis not present

## 2015-04-28 DIAGNOSIS — D509 Iron deficiency anemia, unspecified: Secondary | ICD-10-CM | POA: Diagnosis present

## 2015-04-28 LAB — CBC WITH DIFFERENTIAL/PLATELET
BASOS ABS: 0 10*3/uL (ref 0–0.1)
BASOS PCT: 0 %
EOS PCT: 1 %
Eosinophils Absolute: 0.1 10*3/uL (ref 0–0.7)
HCT: 38.2 % (ref 35.0–47.0)
Hemoglobin: 12.5 g/dL (ref 12.0–16.0)
Lymphocytes Relative: 29 %
Lymphs Abs: 1.8 10*3/uL (ref 1.0–3.6)
MCH: 25.6 pg — ABNORMAL LOW (ref 26.0–34.0)
MCHC: 32.8 g/dL (ref 32.0–36.0)
MCV: 78.1 fL — ABNORMAL LOW (ref 80.0–100.0)
Monocytes Absolute: 0.4 10*3/uL (ref 0.2–0.9)
Monocytes Relative: 6 %
Neutro Abs: 4 10*3/uL (ref 1.4–6.5)
Neutrophils Relative %: 64 %
Platelets: 252 10*3/uL (ref 150–440)
RBC: 4.89 MIL/uL (ref 3.80–5.20)
RDW: 19.1 % — ABNORMAL HIGH (ref 11.5–14.5)
WBC: 6.3 10*3/uL (ref 3.6–11.0)

## 2015-04-28 LAB — IRON AND TIBC
Iron: 37 ug/dL (ref 28–170)
Saturation Ratios: 10 % — ABNORMAL LOW (ref 10.4–31.8)
TIBC: 356 ug/dL (ref 250–450)
UIBC: 319 ug/dL

## 2015-04-28 LAB — DAT, POLYSPECIFIC AHG (ARMC ONLY): Polyspecific AHG test: NEGATIVE

## 2015-04-28 LAB — FERRITIN: FERRITIN: 7 ng/mL — AB (ref 11–307)

## 2015-04-29 LAB — INTRINSIC FACTOR ANTIBODIES: Intrinsic Factor: 1 AU/mL (ref 0.0–1.1)

## 2015-05-06 ENCOUNTER — Ambulatory Visit (INDEPENDENT_AMBULATORY_CARE_PROVIDER_SITE_OTHER): Payer: BC Managed Care – PPO | Admitting: *Deleted

## 2015-05-06 DIAGNOSIS — E538 Deficiency of other specified B group vitamins: Secondary | ICD-10-CM

## 2015-05-06 MED ORDER — CYANOCOBALAMIN 1000 MCG/ML IJ SOLN
1000.0000 ug | Freq: Once | INTRAMUSCULAR | Status: AC
  Administered 2015-05-06: 1000 ug via INTRAMUSCULAR

## 2015-06-02 ENCOUNTER — Inpatient Hospital Stay: Payer: BC Managed Care – PPO | Attending: Internal Medicine

## 2015-06-02 DIAGNOSIS — M810 Age-related osteoporosis without current pathological fracture: Secondary | ICD-10-CM | POA: Insufficient documentation

## 2015-06-02 DIAGNOSIS — E039 Hypothyroidism, unspecified: Secondary | ICD-10-CM | POA: Insufficient documentation

## 2015-06-02 DIAGNOSIS — M1990 Unspecified osteoarthritis, unspecified site: Secondary | ICD-10-CM | POA: Diagnosis not present

## 2015-06-02 DIAGNOSIS — K449 Diaphragmatic hernia without obstruction or gangrene: Secondary | ICD-10-CM | POA: Diagnosis not present

## 2015-06-02 DIAGNOSIS — K648 Other hemorrhoids: Secondary | ICD-10-CM | POA: Insufficient documentation

## 2015-06-02 DIAGNOSIS — E785 Hyperlipidemia, unspecified: Secondary | ICD-10-CM | POA: Insufficient documentation

## 2015-06-02 DIAGNOSIS — I10 Essential (primary) hypertension: Secondary | ICD-10-CM | POA: Insufficient documentation

## 2015-06-02 DIAGNOSIS — E669 Obesity, unspecified: Secondary | ICD-10-CM | POA: Diagnosis not present

## 2015-06-02 DIAGNOSIS — D509 Iron deficiency anemia, unspecified: Secondary | ICD-10-CM | POA: Diagnosis not present

## 2015-06-02 DIAGNOSIS — E538 Deficiency of other specified B group vitamins: Secondary | ICD-10-CM | POA: Insufficient documentation

## 2015-06-02 DIAGNOSIS — K5790 Diverticulosis of intestine, part unspecified, without perforation or abscess without bleeding: Secondary | ICD-10-CM | POA: Insufficient documentation

## 2015-06-02 DIAGNOSIS — E119 Type 2 diabetes mellitus without complications: Secondary | ICD-10-CM | POA: Diagnosis not present

## 2015-06-02 DIAGNOSIS — R5383 Other fatigue: Secondary | ICD-10-CM | POA: Insufficient documentation

## 2015-06-02 DIAGNOSIS — G4733 Obstructive sleep apnea (adult) (pediatric): Secondary | ICD-10-CM | POA: Diagnosis not present

## 2015-06-02 DIAGNOSIS — Z79899 Other long term (current) drug therapy: Secondary | ICD-10-CM | POA: Diagnosis not present

## 2015-06-02 DIAGNOSIS — F329 Major depressive disorder, single episode, unspecified: Secondary | ICD-10-CM | POA: Insufficient documentation

## 2015-06-02 DIAGNOSIS — Z7982 Long term (current) use of aspirin: Secondary | ICD-10-CM | POA: Insufficient documentation

## 2015-06-02 DIAGNOSIS — K219 Gastro-esophageal reflux disease without esophagitis: Secondary | ICD-10-CM | POA: Insufficient documentation

## 2015-06-02 LAB — CBC WITH DIFFERENTIAL/PLATELET
BASOS PCT: 1 %
Basophils Absolute: 0 10*3/uL (ref 0–0.1)
Eosinophils Absolute: 0.1 10*3/uL (ref 0–0.7)
Eosinophils Relative: 1 %
HEMATOCRIT: 39 % (ref 35.0–47.0)
Hemoglobin: 13.1 g/dL (ref 12.0–16.0)
Lymphocytes Relative: 26 %
Lymphs Abs: 2 10*3/uL (ref 1.0–3.6)
MCH: 27.4 pg (ref 26.0–34.0)
MCHC: 33.7 g/dL (ref 32.0–36.0)
MCV: 81.4 fL (ref 80.0–100.0)
Monocytes Absolute: 0.5 10*3/uL (ref 0.2–0.9)
Monocytes Relative: 7 %
NEUTROS ABS: 5 10*3/uL (ref 1.4–6.5)
Neutrophils Relative %: 65 %
Platelets: 270 10*3/uL (ref 150–440)
RBC: 4.79 MIL/uL (ref 3.80–5.20)
RDW: 15 % — ABNORMAL HIGH (ref 11.5–14.5)
WBC: 7.6 10*3/uL (ref 3.6–11.0)

## 2015-06-02 LAB — IRON AND TIBC
IRON: 39 ug/dL (ref 28–170)
Saturation Ratios: 11 % (ref 10.4–31.8)
TIBC: 347 ug/dL (ref 250–450)
UIBC: 308 ug/dL

## 2015-06-02 LAB — FERRITIN: Ferritin: 9 ng/mL — ABNORMAL LOW (ref 11–307)

## 2015-06-03 ENCOUNTER — Inpatient Hospital Stay (HOSPITAL_BASED_OUTPATIENT_CLINIC_OR_DEPARTMENT_OTHER): Payer: BC Managed Care – PPO | Admitting: Internal Medicine

## 2015-06-03 VITALS — BP 146/83 | HR 80 | Temp 98.3°F | Resp 18 | Ht 64.5 in | Wt 211.9 lb

## 2015-06-03 DIAGNOSIS — M810 Age-related osteoporosis without current pathological fracture: Secondary | ICD-10-CM

## 2015-06-03 DIAGNOSIS — K5790 Diverticulosis of intestine, part unspecified, without perforation or abscess without bleeding: Secondary | ICD-10-CM

## 2015-06-03 DIAGNOSIS — K219 Gastro-esophageal reflux disease without esophagitis: Secondary | ICD-10-CM

## 2015-06-03 DIAGNOSIS — E785 Hyperlipidemia, unspecified: Secondary | ICD-10-CM

## 2015-06-03 DIAGNOSIS — R5383 Other fatigue: Secondary | ICD-10-CM | POA: Diagnosis not present

## 2015-06-03 DIAGNOSIS — E538 Deficiency of other specified B group vitamins: Secondary | ICD-10-CM | POA: Diagnosis not present

## 2015-06-03 DIAGNOSIS — K449 Diaphragmatic hernia without obstruction or gangrene: Secondary | ICD-10-CM

## 2015-06-03 DIAGNOSIS — E039 Hypothyroidism, unspecified: Secondary | ICD-10-CM

## 2015-06-03 DIAGNOSIS — E119 Type 2 diabetes mellitus without complications: Secondary | ICD-10-CM

## 2015-06-03 DIAGNOSIS — K648 Other hemorrhoids: Secondary | ICD-10-CM

## 2015-06-03 DIAGNOSIS — D509 Iron deficiency anemia, unspecified: Secondary | ICD-10-CM | POA: Diagnosis not present

## 2015-06-03 DIAGNOSIS — I10 Essential (primary) hypertension: Secondary | ICD-10-CM

## 2015-06-05 ENCOUNTER — Inpatient Hospital Stay: Payer: BC Managed Care – PPO

## 2015-06-05 VITALS — BP 136/84 | HR 76 | Temp 96.0°F | Resp 20

## 2015-06-05 DIAGNOSIS — D509 Iron deficiency anemia, unspecified: Secondary | ICD-10-CM

## 2015-06-05 MED ORDER — SODIUM CHLORIDE 0.9 % IV SOLN
500.0000 mg | Freq: Once | INTRAVENOUS | Status: AC
  Administered 2015-06-05: 500 mg via INTRAVENOUS
  Filled 2015-06-05: qty 25

## 2015-06-05 MED ORDER — SODIUM CHLORIDE 0.9 % IV SOLN
INTRAVENOUS | Status: DC
  Administered 2015-06-05: 10:00:00 via INTRAVENOUS
  Filled 2015-06-05: qty 1000

## 2015-06-10 ENCOUNTER — Ambulatory Visit: Payer: BC Managed Care – PPO

## 2015-06-14 NOTE — Progress Notes (Signed)
[place]  [phone] [phone]) [phone]     ID: Brooke Mitchell OB: 06-05-1951  MR#: [id number]  IRW#:[id number]  Patient Care Team: Brooke Greenspan, MD as PCP - General Kathee Delton, MD as Attending Physician (Pulmonary Disease)  CHIEF COMPLAINT/DIAGNOSIS:  Persistent Anemia secondary to iron deficiency, also has history of B-12 deficiency. Started oral iron therapy July 2016. (diagnosed with B-12 deficiency in March 2016 and started on B-12 injections. On 02/08/15, labs showed B12 was 221, ferritin was low at 3, hemoglobin 9.2, platelets 321, WBC 5900, ANC 3900). August 2016 - hemoglobin 12.5, DAT test negative, intrinsic factor antibody negative, serum iron 37, ferritin 7, iron saturation 10%. On 01/19/15, she had EGD which reported atrophic gastritis H. pylori positive, small hiatal hernia and colonoscopy which reported mild diverticulosis sigmoid colon and grade 1 internal hemorrhoids.   HISTORY OF PRESENT ILLNESS:  Patient returns for continued hematology follow-up, she was seen a few months ago. Overall states that she is feeling slightly better, still gets fatigued on exertion. She is also on B12 injections. Otherwise denies any dyspnea, fatigue at rest, orthopnea, PND, angina or palpitation. She denies any obvious bleeding symptoms. Appetite is  steady.  REVIEW OF SYSTEMS:   ROS As in HPI above. No chills, fever or sweats. No new headaches or focal weakness. No sore throat or dysphagia. No new cough, hemoptysis or chest pain. No abdominal pain, nausea or vomiting. No diarrhea. Chronic arthritis/osteoporosis in the left shoulder. Denies new bone pains.  No numbness or tingling of extremities.    PAST MEDICAL HISTORY: Reviewed. Past Medical History  Diagnosis Date  . Allergic rhinitis   . Asthma     As a child   . GERD (gastroesophageal reflux disease)   . Hyperlipidemia   . Hypertension   . Hypothyroidism   . Diabetes mellitus     Type II  (2/06 elevated microalbumin)  . Obesity   . OSA (obstructive sleep apnea)     CPAP  . Frequent UTI     with coital prohylaxis   . Anemia   . Allergy   . Sleep apnea     wears c-pap  . Anxiety   . Arthritis     osteoarthritis  . Depression   01/19/15 - EGD which reported atrophic gastritis H. pylori positive, small hiatal hernia and Colonoscopy reported mild diverticulosis sigmoid colon and grade 1 internal hemorrhoids.  PAST SURGICAL HISTORY: Reviewed. Past Surgical History  Procedure Laterality Date  . Foot surgery    . Endometrial biopsy    . Dilation and curettage of uterus    . Colonoscopy    . Upper gastrointestinal endoscopy      FAMILY HISTORY: Reviewed. Family History  Problem Relation Age of Onset  . Diabetes Mother   . Coronary artery disease Mother   . Hypothyroidism Mother   . Irritable bowel syndrome Mother   . Alzheimer's disease Father   . Nephrolithiasis Father   . Hypothyroidism Brother   . Colon cancer Neg Hx   . Esophageal cancer Neg Hx   . Rectal cancer Neg Hx   . Stomach cancer Neg Hx     SOCIAL HISTORY: Reviewed. Social History  Substance Use Topics  . Smoking status: Never Smoker   . Smokeless tobacco: Never Used  . Alcohol Use: 0.0 oz/week    0 Standard drinks or equivalent per week     Comment: occasional    No Known Allergies  Current Outpatient Prescriptions  Medication Sig Dispense Refill  . acetaminophen (TYLENOL) 325 MG tablet Take 650 mg by mouth as needed for pain.    Marland Kitchen ADVAIR DISKUS 100-50 MCG/DOSE AEPB USE 1 PUFF TWICE DAILY DURING ALLERGY SEASON 60 each 1  . albuterol (PROVENTIL,VENTOLIN) 90 MCG/ACT inhaler Inhale 2 puffs into the lungs every 4 (four) hours as needed.      Marland Kitchen aspirin 81 MG tablet Take 81 mg by mouth daily.      Marland Kitchen atorvastatin (LIPITOR) 20 MG tablet TAKE 1/2 TABLET (10 MG TOTAL) BY MOUTH DAILY. 15 tablet 5  . bismuth-metronidazole-tetracycline (PYLERA) 140-125-125 MG per capsule Take 3 capsules by mouth 4  (four) times daily -  before meals and at bedtime. 120 capsule 0  . calcium carbonate (CALCIUM 600) 600 MG TABS Take 600 mg by mouth 2 (two) times daily with a meal.      . Cholecalciferol 4000 UNITS CAPS Take 4,000 Units by mouth daily. Pt takes 2 tablets of 2,000 units per day = 4,000 units a day    . cyanocobalamin (,VITAMIN B-12,) 1000 MCG/ML injection Inject 1,000 mcg into the muscle. Once a month    . Cyanocobalamin (VITAMIN B-12 PO) Take 6 mcg by mouth daily.    Marland Kitchen FLUoxetine (PROZAC) 20 MG capsule TAKE 1 CAPSULE BY MOUTH DAILY. 90 capsule 3  . fosinopril-hydrochlorothiazide (MONOPRIL-HCT) 10-12.5 MG per tablet TAKE 1 TABLET BY MOUTH DAILY. 30 tablet 5  . GLIPIZIDE XL 10 MG 24 hr tablet TAKE 1 TABLET (10 MG TOTAL) BY MOUTH DAILY. 30 tablet 4  . hyoscyamine (LEVSIN SL) 0.125 MG SL tablet Place one tablet under tongue once a day as needed. 30 tablet 1  . IRON, FERROUS GLUCONATE, PO Take 1 tablet by mouth 2 (two) times daily.     Marland Kitchen loratadine (CLARITIN) 10 MG tablet Take 10 mg by mouth daily.      . meloxicam (MOBIC) 15 MG tablet TAKE 1 TABLET BY MOUTH DAILY AS NEEDED WITH FOOD 90 tablet 1  . metFORMIN (GLUCOPHAGE) 1000 MG tablet TAKE 1 TABLET (1,000 MG TOTAL) BY MOUTH 2 (TWO) TIMES DAILY WITH A MEAL. 60 tablet 5  . multivitamin (THERAGRAN) per tablet Take 1 tablet by mouth daily.      . ONE TOUCH ULTRA TEST test strip CHECK SUGAR TWICE A DAY AS INSTRUCTED (DM 250.0) THAT IS NOT OPTIMALLYCONTROLLED 100 each 0  . PROAIR HFA 108 (90 BASE) MCG/ACT inhaler USE 2 PUFFS UPTO EVERY 4 HOURS AS NEEDED FOR WHEEZING 8.5 g 1  . SYNTHROID 50 MCG tablet TAKE 1 TABLET (50 MCG TOTAL) BY MOUTH DAILY. 30 tablet 5   No current facility-administered medications for this visit.    PHYSICAL EXAM: Filed Vitals:   06/03/15 1346  BP: 146/83  Pulse: 80  Temp: 98.3 F (36.8 C)  Resp: 18     Body mass index is 35.82 kg/(m^2).     GENERAL: Alert and oriented and in no acute distress. No icterus or pallor. CVS:  S1S2, regular LUNGS: Bilaterally clear to auscultation, no rhonchi. ABDOMEN: Soft, nontender. No hepatosplenomegaly clinically.  EXTREMITIES: No pedal edema.   LAB RESULTS: Lab Results  Component Value Date   WBC 7.6 06/02/2015   NEUTROABS 5.0 06/02/2015   HGB 13.1 06/02/2015   HCT 39.0 06/02/2015   MCV 81.4 06/02/2015   PLT 270 06/02/2015     STUDIES: 01/19/15 - EGD which reported atrophic gastritis H. pylori positive, small hiatal hernia and Colonoscopy reported mild diverticulosis sigmoid colon and grade  1 internal hemorrhoids. Stool Hemoccult-negative.     August 2016 - hemoglobin 12.5, DAT test negative, intrinsic factor antibody negative, serum iron 37, ferritin 7, iron saturation 10%.   ASSESSMENT / PLAN:   Persistent Anemia secondary to iron deficiency, also has history of B-12 deficiency. Started oral iron therapy July 2016. (diagnosed with B-12 deficiency in March 2016 and started on B-12 injections. On 02/08/15, labs showed B12 was 221, ferritin was low at 3, hemoglobin 9.2, platelets 321, WBC 5900, ANC 3900).  August 2016 - hemoglobin 12.5, DAT test negative, intrinsic factor antibody negative, serum iron 37, ferritin 7, iron saturation 10%. -  Reviewed labs are discussed with patient. Hemoglobin is better at 13.1. Iron study however still shows evidence of fine deficiency given low ferritin of 9 without significant improvement since August despite taking oral iron twice daily, iron saturation is in the low normal range at 11% and serum iron is 39. Given this, plan is to pursue one dose of parenteral iron therapy with IV Venofer 500 mg as a 4 hour infusion, patient explained about rationale, benefits and possible side effects of this and she is agreeable to take this treatment. We will continue to follow, get repeat hemoglobin and iron study on 09/23/15 and see MD on 09/24/15, she might possibly need continued maintenance Venofer treatment as indicated by labs.  B12 deficiency  - patient is getting B12 injections. Intrinsic factor antibody is negative.      In between visits, the patient has been advised to call or come to the ER in case of progressive anemia symptoms, acute sickness, or new symptoms. Patient is agreeable to this plan.     Leia Alf, MD   06/14/2015 8:38 AM

## 2015-06-16 ENCOUNTER — Ambulatory Visit (INDEPENDENT_AMBULATORY_CARE_PROVIDER_SITE_OTHER): Payer: BC Managed Care – PPO

## 2015-06-16 DIAGNOSIS — E538 Deficiency of other specified B group vitamins: Secondary | ICD-10-CM | POA: Diagnosis not present

## 2015-06-16 DIAGNOSIS — Z23 Encounter for immunization: Secondary | ICD-10-CM

## 2015-06-16 MED ORDER — CYANOCOBALAMIN 1000 MCG/ML IJ SOLN
1000.0000 ug | Freq: Once | INTRAMUSCULAR | Status: AC
  Administered 2015-06-16: 1000 ug via INTRAMUSCULAR

## 2015-07-21 ENCOUNTER — Ambulatory Visit (INDEPENDENT_AMBULATORY_CARE_PROVIDER_SITE_OTHER): Payer: BC Managed Care – PPO | Admitting: *Deleted

## 2015-07-21 DIAGNOSIS — E538 Deficiency of other specified B group vitamins: Secondary | ICD-10-CM | POA: Diagnosis not present

## 2015-07-21 MED ORDER — CYANOCOBALAMIN 1000 MCG/ML IJ SOLN
1000.0000 ug | Freq: Once | INTRAMUSCULAR | Status: AC
  Administered 2015-07-21: 1000 ug via INTRAMUSCULAR

## 2015-07-28 ENCOUNTER — Other Ambulatory Visit: Payer: Self-pay | Admitting: Family Medicine

## 2015-07-28 NOTE — Telephone Encounter (Signed)
Please schedule PE when due and refill until then  thanks

## 2015-07-28 NOTE — Telephone Encounter (Signed)
Pt had CPE on 12/22/14, last refilled on 01/29/15 #90 with 1 additional refill, please advise

## 2015-08-01 ENCOUNTER — Other Ambulatory Visit: Payer: Self-pay | Admitting: Family Medicine

## 2015-08-14 ENCOUNTER — Other Ambulatory Visit: Payer: Self-pay | Admitting: Family Medicine

## 2015-08-20 LAB — HM DIABETES EYE EXAM

## 2015-08-25 ENCOUNTER — Ambulatory Visit (INDEPENDENT_AMBULATORY_CARE_PROVIDER_SITE_OTHER): Payer: BC Managed Care – PPO | Admitting: *Deleted

## 2015-08-25 DIAGNOSIS — E538 Deficiency of other specified B group vitamins: Secondary | ICD-10-CM

## 2015-08-25 MED ORDER — CYANOCOBALAMIN 1000 MCG/ML IJ SOLN
1000.0000 ug | Freq: Once | INTRAMUSCULAR | Status: AC
  Administered 2015-08-25: 1000 ug via INTRAMUSCULAR

## 2015-09-15 ENCOUNTER — Other Ambulatory Visit: Payer: Self-pay | Admitting: Family Medicine

## 2015-09-23 ENCOUNTER — Other Ambulatory Visit: Payer: BC Managed Care – PPO

## 2015-09-24 ENCOUNTER — Ambulatory Visit: Payer: BC Managed Care – PPO

## 2015-09-24 ENCOUNTER — Encounter: Payer: Self-pay | Admitting: *Deleted

## 2015-09-24 ENCOUNTER — Inpatient Hospital Stay: Payer: BC Managed Care – PPO | Attending: Internal Medicine

## 2015-09-24 ENCOUNTER — Ambulatory Visit: Payer: BC Managed Care – PPO | Admitting: Internal Medicine

## 2015-09-24 DIAGNOSIS — Z7982 Long term (current) use of aspirin: Secondary | ICD-10-CM | POA: Diagnosis not present

## 2015-09-24 DIAGNOSIS — D509 Iron deficiency anemia, unspecified: Secondary | ICD-10-CM

## 2015-09-24 DIAGNOSIS — E785 Hyperlipidemia, unspecified: Secondary | ICD-10-CM | POA: Insufficient documentation

## 2015-09-24 DIAGNOSIS — E039 Hypothyroidism, unspecified: Secondary | ICD-10-CM | POA: Diagnosis not present

## 2015-09-24 DIAGNOSIS — M1990 Unspecified osteoarthritis, unspecified site: Secondary | ICD-10-CM | POA: Insufficient documentation

## 2015-09-24 DIAGNOSIS — I10 Essential (primary) hypertension: Secondary | ICD-10-CM | POA: Insufficient documentation

## 2015-09-24 DIAGNOSIS — E119 Type 2 diabetes mellitus without complications: Secondary | ICD-10-CM | POA: Diagnosis not present

## 2015-09-24 DIAGNOSIS — Z7984 Long term (current) use of oral hypoglycemic drugs: Secondary | ICD-10-CM | POA: Insufficient documentation

## 2015-09-24 DIAGNOSIS — F418 Other specified anxiety disorders: Secondary | ICD-10-CM | POA: Diagnosis not present

## 2015-09-24 DIAGNOSIS — K219 Gastro-esophageal reflux disease without esophagitis: Secondary | ICD-10-CM | POA: Diagnosis not present

## 2015-09-24 DIAGNOSIS — Z79899 Other long term (current) drug therapy: Secondary | ICD-10-CM | POA: Insufficient documentation

## 2015-09-24 DIAGNOSIS — E669 Obesity, unspecified: Secondary | ICD-10-CM | POA: Diagnosis not present

## 2015-09-24 DIAGNOSIS — E538 Deficiency of other specified B group vitamins: Secondary | ICD-10-CM | POA: Diagnosis not present

## 2015-09-24 DIAGNOSIS — G4733 Obstructive sleep apnea (adult) (pediatric): Secondary | ICD-10-CM | POA: Insufficient documentation

## 2015-09-24 DIAGNOSIS — Z8719 Personal history of other diseases of the digestive system: Secondary | ICD-10-CM | POA: Diagnosis not present

## 2015-09-24 LAB — CBC WITH DIFFERENTIAL/PLATELET
BASOS ABS: 0 10*3/uL (ref 0–0.1)
Basophils Relative: 0 %
EOS ABS: 0.1 10*3/uL (ref 0–0.7)
EOS PCT: 2 %
HCT: 42.2 % (ref 35.0–47.0)
Hemoglobin: 14.3 g/dL (ref 12.0–16.0)
LYMPHS ABS: 1.5 10*3/uL (ref 1.0–3.6)
LYMPHS PCT: 28 %
MCH: 28.7 pg (ref 26.0–34.0)
MCHC: 33.8 g/dL (ref 32.0–36.0)
MCV: 85 fL (ref 80.0–100.0)
MONO ABS: 0.5 10*3/uL (ref 0.2–0.9)
Monocytes Relative: 9 %
Neutro Abs: 3.2 10*3/uL (ref 1.4–6.5)
Neutrophils Relative %: 61 %
PLATELETS: 244 10*3/uL (ref 150–440)
RBC: 4.96 MIL/uL (ref 3.80–5.20)
RDW: 12.4 % (ref 11.5–14.5)
WBC: 5.3 10*3/uL (ref 3.6–11.0)

## 2015-09-24 LAB — IRON AND TIBC
Iron: 45 ug/dL (ref 28–170)
Saturation Ratios: 16 % (ref 10.4–31.8)
TIBC: 275 ug/dL (ref 250–450)
UIBC: 230 ug/dL

## 2015-09-24 LAB — FERRITIN: FERRITIN: 65 ng/mL (ref 11–307)

## 2015-09-25 ENCOUNTER — Ambulatory Visit (INDEPENDENT_AMBULATORY_CARE_PROVIDER_SITE_OTHER): Payer: BC Managed Care – PPO | Admitting: *Deleted

## 2015-09-25 ENCOUNTER — Inpatient Hospital Stay: Payer: BC Managed Care – PPO

## 2015-09-25 ENCOUNTER — Inpatient Hospital Stay (HOSPITAL_BASED_OUTPATIENT_CLINIC_OR_DEPARTMENT_OTHER): Payer: BC Managed Care – PPO | Admitting: Internal Medicine

## 2015-09-25 VITALS — BP 164/82 | HR 91 | Temp 97.8°F | Resp 20 | Ht 64.5 in | Wt 208.3 lb

## 2015-09-25 DIAGNOSIS — D509 Iron deficiency anemia, unspecified: Secondary | ICD-10-CM

## 2015-09-25 DIAGNOSIS — Z7984 Long term (current) use of oral hypoglycemic drugs: Secondary | ICD-10-CM

## 2015-09-25 DIAGNOSIS — E785 Hyperlipidemia, unspecified: Secondary | ICD-10-CM

## 2015-09-25 DIAGNOSIS — G4733 Obstructive sleep apnea (adult) (pediatric): Secondary | ICD-10-CM

## 2015-09-25 DIAGNOSIS — E538 Deficiency of other specified B group vitamins: Secondary | ICD-10-CM | POA: Diagnosis not present

## 2015-09-25 DIAGNOSIS — I10 Essential (primary) hypertension: Secondary | ICD-10-CM | POA: Diagnosis not present

## 2015-09-25 DIAGNOSIS — M1990 Unspecified osteoarthritis, unspecified site: Secondary | ICD-10-CM

## 2015-09-25 DIAGNOSIS — E119 Type 2 diabetes mellitus without complications: Secondary | ICD-10-CM

## 2015-09-25 DIAGNOSIS — Z8719 Personal history of other diseases of the digestive system: Secondary | ICD-10-CM | POA: Diagnosis not present

## 2015-09-25 DIAGNOSIS — E039 Hypothyroidism, unspecified: Secondary | ICD-10-CM

## 2015-09-25 DIAGNOSIS — E669 Obesity, unspecified: Secondary | ICD-10-CM

## 2015-09-25 DIAGNOSIS — K219 Gastro-esophageal reflux disease without esophagitis: Secondary | ICD-10-CM

## 2015-09-25 MED ORDER — CYANOCOBALAMIN 1000 MCG/ML IJ SOLN
1000.0000 ug | Freq: Once | INTRAMUSCULAR | Status: AC
  Administered 2015-09-25: 1000 ug via INTRAMUSCULAR

## 2015-09-25 NOTE — Progress Notes (Signed)
[place] OFFICE PROGRESS NOTE  Patient Care Team: Abner Greenspan, MD as PCP - General Kathee Delton, MD as Attending Physician (Pulmonary Disease)   SUMMARY OF HEMATOLOGY-ONCOLOGIC HISTORY:  # 2014 [atleast] Iron deficiency Anemia [? Malabsorption; 2016- EGD/[facility]-neg for bleeding; IV Venofer x2 [id number]; on PO iron TID  # March 2016- B12 def- on B12 injections [neg- intrinsic antibodies]  INTERVAL HISTORY:  This is my first interaction with the patient since I joined the practice September 2016. I reviewed the patient's prior charts/pertinent labs/endoscopies in detail; findings are summarized above.   A very pleasant 65 year old female patient with above history of iron deficiency anemia is here for follow-up. She is on by mouth iron. She denies any constipation or diarrhea. Her appetite is good. No weight loss. No difficulty swallowing. No blood in stools. Her energy levels are adequate.   REVIEW OF SYSTEMS:  A complete 10 point review of system is done which is negative except mentioned above/history of present illness.   PAST MEDICAL HISTORY :  Past Medical History  Diagnosis Date  . Allergic rhinitis   . Asthma     As a child   . GERD (gastroesophageal reflux disease)   . Hyperlipidemia   . Hypertension   . Hypothyroidism   . Diabetes mellitus     Type II (2/06 elevated microalbumin)  . Obesity   . OSA (obstructive sleep apnea)     CPAP  . Frequent UTI     with coital prohylaxis   . IDA (iron deficiency anemia)   . Allergy   . Sleep apnea     wears c-pap  . Anxiety   . Arthritis     osteoarthritis  . Depression     PAST SURGICAL HISTORY :   Past Surgical History  Procedure Laterality Date  . Foot surgery    . Endometrial biopsy    . Dilation and curettage of uterus    . Colonoscopy    . Upper gastrointestinal endoscopy      FAMILY HISTORY :   Family History  Problem Relation Age of Onset  . Diabetes Mother   . Coronary artery disease  Mother   . Hypothyroidism Mother   . Irritable bowel syndrome Mother   . Alzheimer's disease Father   . Nephrolithiasis Father   . Hypothyroidism Brother   . Colon cancer Neg Hx   . Esophageal cancer Neg Hx   . Rectal cancer Neg Hx   . Stomach cancer Neg Hx     SOCIAL HISTORY:   Social History  Substance Use Topics  . Smoking status: Never Smoker   . Smokeless tobacco: Never Used  . Alcohol Use: 0.0 oz/week    0 Standard drinks or equivalent per week     Comment: occasional    ALLERGIES:  has No Known Allergies.  MEDICATIONS:  Current Outpatient Prescriptions  Medication Sig Dispense Refill  . acetaminophen (TYLENOL) 325 MG tablet Take 650 mg by mouth as needed for pain.    Marland Kitchen ADVAIR DISKUS 100-50 MCG/DOSE AEPB USE 1 PUFF TWICE DAILY DURING ALLERGY SEASON 60 each 1  . albuterol (PROVENTIL,VENTOLIN) 90 MCG/ACT inhaler Inhale 2 puffs into the lungs every 4 (four) hours as needed.      . Ascorbic Acid (VITAMIN C) 100 MG tablet Take 100 mg by mouth daily.    Marland Kitchen atorvastatin (LIPITOR) 20 MG tablet TAKE 1/2 TABLET (10 MG TOTAL) BY MOUTH DAILY. 15 tablet 1  . calcium carbonate (CALCIUM  600) 600 MG TABS Take 600 mg by mouth 2 (two) times daily with a meal.      . Cholecalciferol 4000 UNITS CAPS Take 4,000 Units by mouth daily. Pt takes 2 tablets of 2,000 units per day = 4,000 units a day    . cyanocobalamin (,VITAMIN B-12,) 1000 MCG/ML injection Inject 1,000 mcg into the muscle. Once a month    . Cyanocobalamin (VITAMIN B-12 PO) Take 6 mcg by mouth daily.    Marland Kitchen FLUoxetine (PROZAC) 20 MG capsule TAKE 1 CAPSULE BY MOUTH DAILY. 90 capsule 3  . fosinopril-hydrochlorothiazide (MONOPRIL-HCT) 10-12.5 MG tablet TAKE 1 TABLET BY MOUTH DAILY. 30 tablet 1  . GLIPIZIDE XL 10 MG 24 hr tablet TAKE 1 TABLET (10 MG TOTAL) BY MOUTH DAILY. 30 tablet 1  . hyoscyamine (LEVSIN SL) 0.125 MG SL tablet Place one tablet under tongue once a day as needed. 30 tablet 1  . IRON, FERROUS GLUCONATE, PO Take 1 tablet  by mouth 3 (three) times daily.     Marland Kitchen loratadine (CLARITIN) 10 MG tablet Take 10 mg by mouth daily.      . meloxicam (MOBIC) 15 MG tablet TAKE 1 TABLET BY MOUTH DAILY AS NEEDED WITH FOOD, *NEEDS TO SCHEDULE PHYSICAL APPT. BEFORE FUTURE REFILLS ARE GIVEN* 90 tablet 0  . metFORMIN (GLUCOPHAGE) 1000 MG tablet TAKE 1 TABLET (1,000 MG TOTAL) BY MOUTH 2 (TWO) TIMES DAILY WITH A MEAL. 60 tablet 2  . multivitamin (THERAGRAN) per tablet Take 1 tablet by mouth daily.      . ONE TOUCH ULTRA TEST test strip CHECK SUGAR TWICE A DAY AS INSTRUCTED (DM 250.0) THAT IS NOT OPTIMALLYCONTROLLED 100 each 0  . PROAIR HFA 108 (90 BASE) MCG/ACT inhaler USE 2 PUFFS UPTO EVERY 4 HOURS AS NEEDED FOR WHEEZING 8.5 g 1  . SYNTHROID 50 MCG tablet TAKE 1 TABLET (50 MCG TOTAL) BY MOUTH DAILY. (Patient taking differently: TAKE half of a TABLET (50 MCG TOTAL) BY MOUTH DAILY.) 30 tablet 5  . aspirin 81 MG tablet Take 81 mg by mouth daily.       No current facility-administered medications for this visit.    PHYSICAL EXAMINATION:   BP 164/82 mmHg  Pulse 91  Temp(Src) 97.8 F (36.6 C)  Resp 20  Ht 5' 4.5" (1.638 m)  Wt 208 lb 5.4 oz (94.5 kg)  BMI 35.22 kg/m2  Filed Weights   09/25/15 1054  Weight: 208 lb 5.4 oz (94.5 kg)    GENERAL: Well-nourished well-developed; Alert, no distress and comfortable.   EYES: no pallor or icterus OROPHARYNX: no thrush or ulceration; good dentition  NECK: supple, no masses felt LYMPH:  no palpable lymphadenopathy in the cervical, axillary or inguinal regions LUNGS: clear to auscultation and  No wheeze or crackles HEART/CVS: regular rate & rhythm and no murmurs; No lower extremity edema ABDOMEN:abdomen soft, non-tender and normal bowel sounds Musculoskeletal:no cyanosis of digits and no clubbing  PSYCH: alert & oriented x 3 with fluent speech NEURO: no focal motor/sensory deficits SKIN:  no rashes or significant lesions  LABORATORY DATA:  I have reviewed the data as listed     Component Value Date/Time   NA 138 11/26/2014 1704   K 4.0 11/26/2014 1704   CL 103 11/26/2014 1704   CO2 27 11/26/2014 1704   GLUCOSE 103* 11/26/2014 1704   BUN 14 11/26/2014 1704   CREATININE 0.77 11/26/2014 1704   CREATININE 0.78 02/08/2013 1429   CALCIUM 9.4 11/26/2014 1704   PROT 7.2 11/26/2014  1704   ALBUMIN 4.0 11/26/2014 1704   AST 19 11/26/2014 1704   ALT 13 11/26/2014 1704   ALKPHOS 53 11/26/2014 1704   BILITOT 0.4 11/26/2014 1704   GFRNONAA 84.03 03/16/2010 0818   GFRAA 95 09/15/2008 0846    No results found for: SPEP, UPEP  Lab Results  Component Value Date   WBC 5.3 09/24/2015   NEUTROABS 3.2 09/24/2015   HGB 14.3 09/24/2015   HCT 42.2 09/24/2015   MCV 85.0 09/24/2015   PLT 244 09/24/2015      Chemistry      Component Value Date/Time   NA 138 11/26/2014 1704   K 4.0 11/26/2014 1704   CL 103 11/26/2014 1704   CO2 27 11/26/2014 1704   BUN 14 11/26/2014 1704   CREATININE 0.77 11/26/2014 1704   CREATININE 0.78 02/08/2013 1429      Component Value Date/Time   CALCIUM 9.4 11/26/2014 1704   ALKPHOS 53 11/26/2014 1704   AST 19 11/26/2014 1704   ALT 13 11/26/2014 1704   BILITOT 0.4 11/26/2014 1704       RADIOGRAPHIC STUDIES: I have personally reviewed the radiological images as listed and agreed with the findings in the report. No results found.   ASSESSMENT & PLAN:   # Iron deficiency anemia- chronic question etiology; possible malabsorption. Continue iron 3 times a day with twice a day vitamin C. Today hemoglobin is 14 ferritin was 65. No IV iron transfusion required.  # Follow-up in 6 months/with possible IV iron infusion/CBC/ferritin day prior. Patient was given a copy of her blood counts from today.  # B12 deficiency- continue IM B12 injections.  # 15 minutes face-to-face with the patient discussing the above plan of care; more than 50% of time spent on prognosis/ natural history; counseling and coordination.      Cammie Sickle,  MD 09/25/2015 11:16 AM

## 2015-09-30 ENCOUNTER — Other Ambulatory Visit: Payer: Self-pay | Admitting: Family Medicine

## 2015-10-01 ENCOUNTER — Other Ambulatory Visit: Payer: Self-pay | Admitting: Family Medicine

## 2015-10-23 ENCOUNTER — Other Ambulatory Visit: Payer: Self-pay | Admitting: Family Medicine

## 2015-10-26 NOTE — Telephone Encounter (Signed)
Electronic refill request, pt has CPE scheduled on 12/23/15, last refilled on 07/29/15 #90 with 0 refills, please advise

## 2015-10-26 NOTE — Telephone Encounter (Signed)
done

## 2015-10-26 NOTE — Telephone Encounter (Signed)
Please refill until her appt  Thanks

## 2015-10-27 ENCOUNTER — Ambulatory Visit (INDEPENDENT_AMBULATORY_CARE_PROVIDER_SITE_OTHER): Payer: BC Managed Care – PPO | Admitting: *Deleted

## 2015-10-27 DIAGNOSIS — E538 Deficiency of other specified B group vitamins: Secondary | ICD-10-CM

## 2015-10-27 MED ORDER — CYANOCOBALAMIN 1000 MCG/ML IJ SOLN
1000.0000 ug | Freq: Once | INTRAMUSCULAR | Status: AC
  Administered 2015-10-27: 1000 ug via INTRAMUSCULAR

## 2015-11-02 ENCOUNTER — Other Ambulatory Visit: Payer: Self-pay | Admitting: Family Medicine

## 2015-11-11 ENCOUNTER — Ambulatory Visit (INDEPENDENT_AMBULATORY_CARE_PROVIDER_SITE_OTHER): Payer: BC Managed Care – PPO | Admitting: Pulmonary Disease

## 2015-11-11 ENCOUNTER — Other Ambulatory Visit: Payer: Self-pay | Admitting: Family Medicine

## 2015-11-11 ENCOUNTER — Encounter: Payer: Self-pay | Admitting: Pulmonary Disease

## 2015-11-11 VITALS — BP 142/90 | HR 102 | Ht 64.5 in | Wt 210.4 lb

## 2015-11-11 DIAGNOSIS — D509 Iron deficiency anemia, unspecified: Secondary | ICD-10-CM | POA: Diagnosis not present

## 2015-11-11 DIAGNOSIS — G4730 Sleep apnea, unspecified: Secondary | ICD-10-CM

## 2015-11-11 NOTE — Patient Instructions (Signed)
New humidifier & FF mask Take prozac in daytime CPAP supplies will be renewed x 1 year

## 2015-11-11 NOTE — Assessment & Plan Note (Signed)
Leg symptoms related to fe def - now resolved

## 2015-11-11 NOTE — Addendum Note (Signed)
Addended by: Mathis Dad on: 11/11/2015 05:16 PM   Modules accepted: Orders

## 2015-11-11 NOTE — Assessment & Plan Note (Signed)
New humidifier & FF mask Take prozac in daytime CPAP supplies will be renewed x 1 year  Weight loss encouraged, compliance with goal of at least 4-6 hrs every night is the expectation. Advised against medications with sedative side effects Cautioned against driving when sleepy - understanding that sleepiness will vary on a day to day basis

## 2015-11-11 NOTE — Progress Notes (Signed)
   Subjective:    Patient ID: Brooke Mitchell, female    DOB: Mar 12, 1951, 65 y.o.   MRN: [id number]  HPI  65 year old with OSA for FU of OSA & non-refreshing sleep Since 2006 she has been maintained on CPAP with a full face mask, she obtained a new machine in 2012- set at 17 cm.  She has a family history of restless leg syndrome in her brother and father  Her weight has been constant over the past 10 years She is a retired [employer]   11/11/2015  Chief Complaint  Patient presents with  . Follow-up    No download available. Doing well on CPAP.  No concerns, just wants new upgraded machine current machine is 65 years old.   She  reported non-refreshing sleep and fatigue- evaluation showed low iron, low ferritin, low B-12 and vitamin D levels. Colonoscopy and EGD did not show any lesions. She is on iron supplementation - ferritin has improved to 65 C/o dryness  compliant with cpap - pr ok, FF mask ok No DME issues   Significant tests/ events PSG 05/2005 showed severe OSA, AHI 66 per hour, lowest desat 78% corrected by CPAP 19 cm.  Download 02/2015 On 17cm, No residuals, Good usage AHI: 4.1  Ferritin 09/2015 65  Review of Systems Patient denies significant dyspnea,cough, hemoptysis,  chest pain, palpitations, pedal edema, orthopnea, paroxysmal nocturnal dyspnea, lightheadedness, nausea, vomiting, abdominal or  leg pains      Objective:   Physical Exam  Gen. Pleasant, obese, in no distress ENT - no lesions, no post nasal drip Neck: No JVD, no thyromegaly, no carotid bruits Lungs: no use of accessory muscles, no dullness to percussion, decreased without rales or rhonchi  Cardiovascular: Rhythm regular, heart sounds  normal, no murmurs or gallops, no peripheral edema Musculoskeletal: No deformities, no cyanosis or clubbing , no tremors       Assessment & Plan:

## 2015-12-01 ENCOUNTER — Ambulatory Visit (INDEPENDENT_AMBULATORY_CARE_PROVIDER_SITE_OTHER): Payer: BC Managed Care – PPO

## 2015-12-01 DIAGNOSIS — E538 Deficiency of other specified B group vitamins: Secondary | ICD-10-CM

## 2015-12-01 MED ORDER — CYANOCOBALAMIN 1000 MCG/ML IJ SOLN
1000.0000 ug | Freq: Once | INTRAMUSCULAR | Status: AC
  Administered 2015-12-01: 1000 ug via INTRAMUSCULAR

## 2015-12-12 ENCOUNTER — Telehealth: Payer: Self-pay | Admitting: Family Medicine

## 2015-12-12 DIAGNOSIS — E538 Deficiency of other specified B group vitamins: Secondary | ICD-10-CM

## 2015-12-12 DIAGNOSIS — E119 Type 2 diabetes mellitus without complications: Secondary | ICD-10-CM

## 2015-12-12 DIAGNOSIS — Z0000 Encounter for general adult medical examination without abnormal findings: Secondary | ICD-10-CM

## 2015-12-12 NOTE — Telephone Encounter (Signed)
-----   Message from Marchia Bond sent at 12/10/2015  2:40 PM EDT ----- Regarding: Cpx labs Wed 4/12, need orders. Thanks! :-) Please order  future cpx labs for pt's upcoming lab appt. Thanks Aniceto Boss

## 2015-12-14 ENCOUNTER — Other Ambulatory Visit: Payer: Self-pay | Admitting: Family Medicine

## 2015-12-16 ENCOUNTER — Other Ambulatory Visit (INDEPENDENT_AMBULATORY_CARE_PROVIDER_SITE_OTHER): Payer: BC Managed Care – PPO

## 2015-12-16 DIAGNOSIS — Z0000 Encounter for general adult medical examination without abnormal findings: Secondary | ICD-10-CM

## 2015-12-16 DIAGNOSIS — E119 Type 2 diabetes mellitus without complications: Secondary | ICD-10-CM | POA: Diagnosis not present

## 2015-12-16 DIAGNOSIS — D509 Iron deficiency anemia, unspecified: Secondary | ICD-10-CM | POA: Diagnosis not present

## 2015-12-16 DIAGNOSIS — E538 Deficiency of other specified B group vitamins: Secondary | ICD-10-CM | POA: Diagnosis not present

## 2015-12-16 LAB — COMPREHENSIVE METABOLIC PANEL
ALT: 19 U/L (ref 0–35)
AST: 17 U/L (ref 0–37)
Albumin: 4.2 g/dL (ref 3.5–5.2)
Alkaline Phosphatase: 52 U/L (ref 39–117)
BUN: 21 mg/dL (ref 6–23)
CALCIUM: 9.8 mg/dL (ref 8.4–10.5)
CHLORIDE: 104 meq/L (ref 96–112)
CO2: 31 meq/L (ref 19–32)
CREATININE: 0.82 mg/dL (ref 0.40–1.20)
GFR: 74.39 mL/min (ref >60.00)
Glucose, Bld: 83 mg/dL (ref 70–99)
Potassium: 4.1 mEq/L (ref 3.5–5.1)
Sodium: 142 mEq/L (ref 135–145)
Total Bilirubin: 0.7 mg/dL (ref 0.2–1.2)
Total Protein: 6.9 g/dL (ref 6.0–8.3)

## 2015-12-16 LAB — TSH: TSH: 0.74 u[IU]/mL (ref 0.35–4.50)

## 2015-12-16 LAB — CBC WITH DIFFERENTIAL/PLATELET
BASOS PCT: 0.2 % (ref 0.0–3.0)
Basophils Absolute: 0 10*3/uL (ref 0.0–0.1)
EOS ABS: 0.1 10*3/uL (ref 0.0–0.7)
Eosinophils Relative: 1.8 % (ref 0.0–5.0)
HEMATOCRIT: 42.1 % (ref 36.0–46.0)
Hemoglobin: 14.1 g/dL (ref 12.0–15.0)
LYMPHS ABS: 2.3 10*3/uL (ref 0.7–4.0)
LYMPHS PCT: 30.8 % (ref 12.0–46.0)
MCHC: 33.6 g/dL (ref 30.0–36.0)
MCV: 87.4 fl (ref 78.0–100.0)
MONO ABS: 0.5 10*3/uL (ref 0.1–1.0)
Monocytes Relative: 7 % (ref 3.0–12.0)
NEUTROS ABS: 4.4 10*3/uL (ref 1.4–7.7)
Neutrophils Relative %: 60.2 % (ref 43.0–77.0)
PLATELETS: 292 10*3/uL (ref 150.0–400.0)
RBC: 4.82 Mil/uL (ref 3.87–5.11)
RDW: 12.8 % (ref 11.5–15.5)
WBC: 7.4 10*3/uL (ref 4.0–10.5)

## 2015-12-16 LAB — LIPID PANEL
CHOLESTEROL: 163 mg/dL (ref 0–200)
HDL: 44.2 mg/dL (ref >39.00)
LDL CALC: 80 mg/dL (ref 0–99)
NonHDL: 118.54
TRIGLYCERIDES: 194 mg/dL — AB (ref 0.0–149.0)
Total CHOL/HDL Ratio: 4
VLDL: 38.8 mg/dL (ref 0.0–40.0)

## 2015-12-16 LAB — VITAMIN B12: Vitamin B-12: >1500 pg/mL — ABNORMAL HIGH (ref 211–911)

## 2015-12-16 LAB — HEMOGLOBIN A1C: Hgb A1c MFr Bld: 6.1 % (ref 4.6–6.5)

## 2015-12-23 ENCOUNTER — Encounter: Payer: Self-pay | Admitting: Family Medicine

## 2015-12-23 ENCOUNTER — Ambulatory Visit (INDEPENDENT_AMBULATORY_CARE_PROVIDER_SITE_OTHER): Payer: BC Managed Care – PPO | Admitting: Family Medicine

## 2015-12-23 VITALS — BP 130/80 | HR 76 | Temp 97.9°F | Ht 64.5 in | Wt 209.2 lb

## 2015-12-23 DIAGNOSIS — N3941 Urge incontinence: Secondary | ICD-10-CM | POA: Insufficient documentation

## 2015-12-23 DIAGNOSIS — E538 Deficiency of other specified B group vitamins: Secondary | ICD-10-CM | POA: Diagnosis not present

## 2015-12-23 DIAGNOSIS — E785 Hyperlipidemia, unspecified: Secondary | ICD-10-CM

## 2015-12-23 DIAGNOSIS — E039 Hypothyroidism, unspecified: Secondary | ICD-10-CM

## 2015-12-23 DIAGNOSIS — E119 Type 2 diabetes mellitus without complications: Secondary | ICD-10-CM

## 2015-12-23 DIAGNOSIS — M79672 Pain in left foot: Secondary | ICD-10-CM | POA: Insufficient documentation

## 2015-12-23 DIAGNOSIS — I10 Essential (primary) hypertension: Secondary | ICD-10-CM | POA: Diagnosis not present

## 2015-12-23 DIAGNOSIS — Z0000 Encounter for general adult medical examination without abnormal findings: Secondary | ICD-10-CM | POA: Diagnosis not present

## 2015-12-23 DIAGNOSIS — E669 Obesity, unspecified: Secondary | ICD-10-CM

## 2015-12-23 MED ORDER — FLUOXETINE HCL 20 MG PO CAPS: 20.0000 mg | ORAL_CAPSULE | Freq: Every day | ORAL | Status: DC

## 2015-12-23 NOTE — Progress Notes (Signed)
Subjective:    Patient ID: Brooke Mitchell, female    DOB: 07-21-1951, 65 y.o.   MRN: [id number]  HPI Here for health maintenance exam and to review chronic medical problems    Doing ok overall   Wt is down 1 lb with bmi of 35 Obese range  Trying to eat better/healthy  Walks for exercise  Plans to do the silver sneakers program   Gyn exam 6/14  Nl pap  No gyn problems  Does not see a gyn  Does not want a pap   Flu shot 10/16 Mm 11/16 nl  Self breast exam-no lumps   Td 7/11   Colonoscopy 5/16 - 10 year recall   Zoster vaccine 2012   bp is stable today  No cp or palpitations or headaches or edema  No side effects to medicines  BP Readings from Last 3 Encounters:  12/23/15 138/78  11/11/15 142/90  09/25/15 164/82     Results for orders placed or performed in visit on 12/16/15  CBC with Differential/Platelet  Result Value Ref Range   WBC 7.4 4.0 - 10.5 K/uL   RBC 4.82 3.87 - 5.11 Mil/uL   Hemoglobin 14.1 12.0 - 15.0 g/dL   HCT 42.1 36.0 - 46.0 %   MCV 87.4 78.0 - 100.0 fl   MCHC 33.6 30.0 - 36.0 g/dL   RDW 12.8 11.5 - 15.5 %   Platelets 292.0 150.0 - 400.0 K/uL   Neutrophils Relative % 60.2 43.0 - 77.0 %   Lymphocytes Relative 30.8 12.0 - 46.0 %   Monocytes Relative 7.0 3.0 - 12.0 %   Eosinophils Relative 1.8 0.0 - 5.0 %   Basophils Relative 0.2 0.0 - 3.0 %   Neutro Abs 4.4 1.4 - 7.7 K/uL   Lymphs Abs 2.3 0.7 - 4.0 K/uL   Monocytes Absolute 0.5 0.1 - 1.0 K/uL   Eosinophils Absolute 0.1 0.0 - 0.7 K/uL   Basophils Absolute 0.0 0.0 - 0.1 K/uL  Comprehensive metabolic panel  Result Value Ref Range   Sodium 142 135 - 145 mEq/L   Potassium 4.1 3.5 - 5.1 mEq/L   Chloride 104 96 - 112 mEq/L   CO2 31 19 - 32 mEq/L   Glucose, Bld 83 70 - 99 mg/dL   BUN 21 6 - 23 mg/dL   Creatinine, Ser 0.82 0.40 - 1.20 mg/dL   Total Bilirubin 0.7 0.2 - 1.2 mg/dL   Alkaline Phosphatase 52 39 - 117 U/L   AST 17 0 - 37 U/L   ALT 19 0 - 35 U/L   Total Protein 6.9 6.0 - 8.3  g/dL   Albumin 4.2 3.5 - 5.2 g/dL   Calcium 9.8 8.4 - 10.5 mg/dL   GFR 74.39 >60.00 mL/min  Hemoglobin A1c  Result Value Ref Range   Hgb A1c MFr Bld 6.1 4.6 - 6.5 %  Lipid panel  Result Value Ref Range   Cholesterol 163 0 - 200 mg/dL   Triglycerides 194.0 (H) 0.0 - 149.0 mg/dL   HDL 44.20 >39.00 mg/dL   VLDL 38.8 0.0 - 40.0 mg/dL   LDL Cholesterol 80 0 - 99 mg/dL   Total CHOL/HDL Ratio 4    NonHDL 118.54   TSH  Result Value Ref Range   TSH 0.74 0.35 - 4.50 uIU/mL  Vitamin B12  Result Value Ref Range   Vitamin B-12 >1500 (H) 211 - 911 pg/mL    Getting B12 shots monthly  Also taking oral B12   Hypothyroidism  Pt has no clinical changes No change in energy level/ hair or skin/ edema and no tremor Lab Results  Component Value Date   TSH 0.74 12/16/2015       Glucose is improved  A1C is 6.1 -down from 6.6  Metformin 1000 and glipizide   Patient Active Problem List   Diagnosis Date Noted  . Anemia, iron deficiency 02/19/2015  . Vitamin B 12 deficiency 12/05/2014  . Fatigue 11/26/2014  . Left shoulder pain 11/26/2014  . Viral URI with cough 08/26/2014  . Cellulitis of finger 05/30/2014  . Grief reaction 01/22/2014  . Colon cancer screening 10/24/2012  . Anemia 10/24/2012  . Trochanteric bursitis of left hip 11/14/2011  . Obesity 10/19/2011  . Other screening mammogram 04/13/2011  . Routine general medical examination at a health care facility 04/07/2011  . UTI'S, RECURRENT 02/03/2010  . POSTMENOPAUSAL STATUS 03/28/2008  . Hypothyroidism 11/24/2006  . Diabetes type 2, controlled ([place]) 11/24/2006  . Hyperlipidemia 11/24/2006  . RESTLESS LEG SYNDROME 11/24/2006  . Essential hypertension 11/24/2006  . PREMATURE VENTRICULAR CONTRACTIONS 11/24/2006  . HEMORRHOIDS 11/24/2006  . ALLERGIC RHINITIS 11/24/2006  . ASTHMA 11/24/2006  . GERD 11/24/2006  . HIATAL HERNIA 11/24/2006  . OVERACTIVE BLADDER 11/24/2006  . [place] DISEASE, CERVICAL 11/24/2006  . Sleep apnea  11/24/2006   Past Medical History  Diagnosis Date  . Allergic rhinitis   . Asthma     As a child   . GERD (gastroesophageal reflux disease)   . Hyperlipidemia   . Hypertension   . Hypothyroidism   . Diabetes mellitus     Type II (2/06 elevated microalbumin)  . Obesity   . OSA (obstructive sleep apnea)     CPAP  . Frequent UTI     with coital prohylaxis   . IDA (iron deficiency anemia)   . Allergy   . Sleep apnea     wears c-pap  . Anxiety   . Arthritis     osteoarthritis  . Depression    Past Surgical History  Procedure Laterality Date  . Foot surgery    . Endometrial biopsy    . Dilation and curettage of uterus    . Colonoscopy    . Upper gastrointestinal endoscopy     Social History  Substance Use Topics  . Smoking status: Never Smoker   . Smokeless tobacco: Never Used  . Alcohol Use: 0.0 oz/week    0 Standard drinks or equivalent per week     Comment: occasional   Family History  Problem Relation Age of Onset  . Diabetes Mother   . Coronary artery disease Mother   . Hypothyroidism Mother   . Irritable bowel syndrome Mother   . Alzheimer's disease Father   . Nephrolithiasis Father   . Hypothyroidism Brother   . Colon cancer Neg Hx   . Esophageal cancer Neg Hx   . Rectal cancer Neg Hx   . Stomach cancer Neg Hx    No Known Allergies Current Outpatient Prescriptions on File Prior to Visit  Medication Sig Dispense Refill  . acetaminophen (TYLENOL) 325 MG tablet Take 650 mg by mouth as needed for pain.    Marland Kitchen ADVAIR DISKUS 100-50 MCG/DOSE AEPB USE 1 PUFF TWICE DAILY DURING ALLERGY SEASON 60 each 1  . albuterol (PROVENTIL,VENTOLIN) 90 MCG/ACT inhaler Inhale 2 puffs into the lungs every 4 (four) hours as needed.      . Ascorbic Acid (VITAMIN C) 100 MG tablet Take 100 mg by mouth daily.    Marland Kitchen  aspirin 81 MG tablet Take 81 mg by mouth daily.      Marland Kitchen atorvastatin (LIPITOR) 20 MG tablet TAKE 1/2 TABLET (10 MG TOTAL) BY MOUTH DAILY. 15 tablet 5  . calcium carbonate  (CALCIUM 600) 600 MG TABS Take 600 mg by mouth 2 (two) times daily with a meal.      . Cholecalciferol 4000 UNITS CAPS Take 4,000 Units by mouth daily. Pt takes 2 tablets of 2,000 units per day = 4,000 units a day    . cyanocobalamin (,VITAMIN B-12,) 1000 MCG/ML injection Inject 1,000 mcg into the muscle. Once a month    . Cyanocobalamin (VITAMIN B-12 PO) Take 6 mcg by mouth daily.    Marland Kitchen FLUoxetine (PROZAC) 20 MG capsule TAKE 1 CAPSULE BY MOUTH DAILY. 90 capsule 3  . fosinopril-hydrochlorothiazide (MONOPRIL-HCT) 10-12.5 MG tablet TAKE 1 TABLET BY MOUTH DAILY. 30 tablet 2  . GLIPIZIDE XL 10 MG 24 hr tablet TAKE 1 TABLET (10 MG TOTAL) BY MOUTH DAILY. 30 tablet 2  . hyoscyamine (LEVSIN SL) 0.125 MG SL tablet Place one tablet under tongue once a day as needed. 30 tablet 1  . IRON, FERROUS GLUCONATE, PO Take 1 tablet by mouth 3 (three) times daily.     Marland Kitchen loratadine (CLARITIN) 10 MG tablet Take 10 mg by mouth daily.      . meloxicam (MOBIC) 15 MG tablet TAKE 1 TABLET BY MOUTH DAILY AS NEEDED WITH FOOD 90 tablet 0  . metFORMIN (GLUCOPHAGE) 1000 MG tablet TAKE 1 TABLET (1,000 MG TOTAL) BY MOUTH 2 (TWO) TIMES DAILY WITH A MEAL. 60 tablet 1  . multivitamin (THERAGRAN) per tablet Take 1 tablet by mouth daily.      . ONE TOUCH ULTRA TEST test strip CHECK SUGAR TWICE A DAY AS INSTRUCTED (DM 250.0) THAT IS NOT OPTIMALLYCONTROLLED 100 each 0  . PROAIR HFA 108 (90 BASE) MCG/ACT inhaler USE 2 PUFFS UPTO EVERY 4 HOURS AS NEEDED FOR WHEEZING 8.5 g 1  . SYNTHROID 50 MCG tablet TAKE 1 TABLET (50 MCG TOTAL) BY MOUTH DAILY. 30 tablet 2   No current facility-administered medications on file prior to visit.     Review of Systems Review of Systems  Constitutional: Negative for fever, appetite change, fatigue and unexpected weight change.  Eyes: Negative for pain and visual disturbance.  Respiratory: Negative for cough and shortness of breath.   Cardiovascular: Negative for cp or palpitations    Gastrointestinal:  Negative for nausea, diarrhea and constipation.  Genitourinary: Negative for urgency and frequency.  Skin: Negative for pallor or rash   Neurological: Negative for weakness, light-headedness, numbness and headaches.  Hematological: Negative for adenopathy. Does not bruise/bleed easily.  Psychiatric/Behavioral: Negative for dysphoric mood. The patient is not nervous/anxious.         Objective:   Physical Exam  Constitutional: She appears well-developed and well-nourished. No distress.  obese and well appearing   HENT:  Head: Normocephalic and atraumatic.  Right Ear: External ear normal.  Left Ear: External ear normal.  Mouth/Throat: Oropharynx is clear and moist.  Eyes: Conjunctivae and EOM are normal. Pupils are equal, round, and reactive to light. No scleral icterus.  Neck: Normal range of motion. Neck supple. No JVD present. Carotid bruit is not present. No thyromegaly present.  Cardiovascular: Normal rate, regular rhythm, normal heart sounds and intact distal pulses.  Exam reveals no gallop.   Pulmonary/Chest: Effort normal and breath sounds normal. No respiratory distress. She has no wheezes. She exhibits no tenderness.  Abdominal:  Soft. Bowel sounds are normal. She exhibits no distension, no abdominal bruit and no mass. There is no tenderness.  Genitourinary: No breast swelling, tenderness, discharge or bleeding.  Breast exam: No mass, nodules, thickening, tenderness, bulging, retraction, inflamation, nipple discharge or skin changes noted.  No axillary or clavicular LA.      Musculoskeletal: Normal range of motion. She exhibits no tenderness.  Mild medial swelling of L foot  Lymphadenopathy:    She has no cervical adenopathy.  Neurological: She is alert. She has normal reflexes. No cranial nerve deficit. She exhibits normal muscle tone. Coordination normal.  Skin: Skin is warm and dry. No rash noted. No erythema. No pallor.  Psychiatric: She has a normal mood and affect.           Assessment & Plan:   Problem List Items Addressed This Visit      Cardiovascular and Mediastinum   Essential hypertension - Primary    bp in fair control at this time  BP Readings from Last 1 Encounters:  12/23/15 130/80   No changes needed Disc lifstyle change with low sodium diet and exercise  Labs reviewed         Digestive   Vitamin B 12 deficiency    Level is above tx at this time Will extend B12 inj to every 8 weeks  Enc balanced diet         Endocrine   Diabetes type 2, controlled ([place])    Improved with better habits Lab Results  Component Value Date   HGBA1C 6.1 12/16/2015   Enc wt loss and low glycemic diet       Hypothyroidism    Hypothyroidism  Pt has no clinical changes No change in energy level/ hair or skin/ edema and no tremor Lab Results  Component Value Date   TSH 0.74 12/16/2015            Other   Hyperlipidemia    Disc goals for lipids and reasons to control them Rev labs with pt Rev low sat fat diet in detail Continue lipitor and low fat diet       Left foot pain    Pt injured L foot- remotely - still sore  May request xr or pod ref in the future       Obesity    Discussed how this problem influences overall health and the risks it imposes  Reviewed plan for weight loss with lower calorie diet (via better food choices and also portion control or program like weight watchers) and exercise building up to or more than 30 minutes 5 days per week including some aerobic activity         Routine general medical examination at a health care facility    Reviewed health habits including diet and exercise and skin cancer prevention Reviewed appropriate screening tests for age  Also reviewed health mt list, fam hx and immunization status , as well as social and family history   See HPI Labs reviewed Enc further wt loss and better habits        Urge incontinence    Disc "freeze and squeeze" and kegel techniques  Pt  considering urol ref-will let us know

## 2015-12-23 NOTE — Patient Instructions (Addendum)
You can extend the B12 shots to every other month  Work on diet and exercise for weight loss  Take prozac every other day for 2 weeks and then stop it - let me know if you have any problems  Let me know know if or when you want to see a urologist for incontinence  Try the "freeze and squeeze" method for urge incontinence  F/u 6 mo and labs prior

## 2015-12-23 NOTE — Progress Notes (Signed)
Pre visit review using our clinic review tool, if applicable. No additional management support is needed unless otherwise documented below in the visit note. 

## 2015-12-24 NOTE — Assessment & Plan Note (Signed)
Improved with better habits Lab Results  Component Value Date   HGBA1C 6.1 12/16/2015   Enc wt loss and low glycemic diet

## 2015-12-24 NOTE — Assessment & Plan Note (Signed)
Level is above tx at this time Will extend B12 inj to every 8 weeks  Enc balanced diet

## 2015-12-24 NOTE — Assessment & Plan Note (Signed)
Disc "freeze and squeeze" and kegel techniques  Pt considering urol ref-will let us know

## 2015-12-24 NOTE — Assessment & Plan Note (Signed)
Reviewed health habits including diet and exercise and skin cancer prevention Reviewed appropriate screening tests for age  Also reviewed health mt list, fam hx and immunization status , as well as social and family history   See HPI Labs reviewed Enc further wt loss and better habits

## 2015-12-24 NOTE — Assessment & Plan Note (Signed)
Pt injured L foot- remotely - still sore  May request xr or pod ref in the future

## 2015-12-24 NOTE — Assessment & Plan Note (Signed)
Discussed how this problem influences overall health and the risks it imposes  Reviewed plan for weight loss with lower calorie diet (via better food choices and also portion control or program like weight watchers) and exercise building up to or more than 30 minutes 5 days per week including some aerobic activity    

## 2015-12-24 NOTE — Assessment & Plan Note (Signed)
Disc goals for lipids and reasons to control them Rev labs with pt Rev low sat fat diet in detail Continue lipitor and low fat diet

## 2015-12-24 NOTE — Assessment & Plan Note (Signed)
bp in fair control at this time  BP Readings from Last 1 Encounters:  12/23/15 130/80   No changes needed Disc lifstyle change with low sodium diet and exercise  Labs reviewed

## 2015-12-24 NOTE — Assessment & Plan Note (Signed)
Hypothyroidism  Pt has no clinical changes No change in energy level/ hair or skin/ edema and no tremor Lab Results  Component Value Date   TSH 0.74 12/16/2015

## 2015-12-30 ENCOUNTER — Other Ambulatory Visit: Payer: Self-pay | Admitting: Family Medicine

## 2016-01-05 ENCOUNTER — Ambulatory Visit: Payer: BC Managed Care – PPO

## 2016-01-12 ENCOUNTER — Encounter: Payer: Self-pay | Admitting: Podiatry

## 2016-01-12 ENCOUNTER — Ambulatory Visit (INDEPENDENT_AMBULATORY_CARE_PROVIDER_SITE_OTHER): Payer: Medicare Other

## 2016-01-12 ENCOUNTER — Ambulatory Visit (INDEPENDENT_AMBULATORY_CARE_PROVIDER_SITE_OTHER): Payer: Medicare Other | Admitting: Podiatry

## 2016-01-12 VITALS — BP 165/95 | HR 94 | Resp 18

## 2016-01-12 DIAGNOSIS — R52 Pain, unspecified: Secondary | ICD-10-CM

## 2016-01-12 DIAGNOSIS — M779 Enthesopathy, unspecified: Secondary | ICD-10-CM | POA: Diagnosis not present

## 2016-01-12 NOTE — Progress Notes (Signed)
   Subjective:    Patient ID: Brooke Mitchell, female    DOB: 04-Jun-1951, 65 y.o.   MRN: [id number]  HPI  65 year old female presents the office with concerns of left foot pain.  6 months ago she dropped a speaker her left foot and the pain didn't resolve however about 2 months ago she started having reoccurrence of the pain. She states the pains are to come back and she changed her shoes and she has not went supportive shoe gear anymore. She says that she is not having any swelling or redness recently. Areas painful as she walks more. Denies any numbness or tingling. She is diabetic A1c was 6.1. No other complaints.   Review of Systems  All other systems reviewed and are negative.      Objective:   Physical Exam General: AAO x3, NAD  Dermatological: Skin is warm, dry and supple bilateral. Nails x 10 are well manicured; remaining integument appears unremarkable at this time. There are no open sores, no preulcerative lesions, no rash or signs of infection present.  Vascular: Dorsalis Pedis artery and Posterior Tibial artery pedal pulses are 2/4 bilateral with immedate capillary fill time. Pedal hair growth present. No varicosities and no lower extremity edema present bilateral. There is no pain with calf compression, swelling, warmth, erythema.   Neruologic: Grossly intact via light touch bilateral. Vibratory intact via tuning fork bilateral. Protective threshold with Semmes Wienstein monofilament intact to all pedal sites bilateral. Patellar and Achilles deep tendon reflexes 2+ bilateral. No Babinski or clonus noted bilateral.   Musculoskeletal: Is tenderness medially over on the navicular tuberosity. Is also some mild discomfort on the medial aspect of the Lisfranc joint on the left foot. There is no pain vibratory sensation. There is no pain on the substance of the posterior tibial tendon. There is a mild decrease in medial arch height upon weightbearing. There is no specific area of  tenderness. There is no overlying edema, erythema, increase in warmth.  Gait: Unassisted, Nonantalgic.      Assessment & Plan:  65 year old female left Lisfranc joint pain, insertional posterior tibial tendinitis likely due to biomechanical changes, injury -Treatment options discussed including all alternatives, risks, and complications -Etiology of symptoms were discussed -X-rays were obtained and reviewed with the patient. Ends of acute fracture or stress fracture is identified at this time. -And some ankle brace to help with tendinitis. -I discussed shoe gear modifications and supportive shoes/inserts. Over this will help a more long-term. Bleeders symptoms recur due to pain more due to biomechanical changes as opposed to the injury. -Prescribed compound cream. -Follow-up as scheduled or sooner if any problems arise. In the meantime, encouraged to call the office with any questions, concerns, change in symptoms.   Celesta Gentile, DPM

## 2016-01-16 ENCOUNTER — Other Ambulatory Visit: Payer: Self-pay | Admitting: Family Medicine

## 2016-01-27 ENCOUNTER — Telehealth: Payer: Self-pay | Admitting: *Deleted

## 2016-01-27 NOTE — Telephone Encounter (Addendum)
Pt states she was seen 2 weeks ago and a compound was to be called in, but nothing has arrived and no one has called. 01/28/2016-DrJacqualyn Posey ordered [place] antiinflammatory cream.  Orders call to pt and faxed to [facility].

## 2016-01-27 NOTE — Telephone Encounter (Signed)
Not sure what happened. Can you order the anti-inflammatory compound cream?

## 2016-01-28 MED ORDER — NONFORMULARY OR COMPOUNDED ITEM: Status: DC

## 2016-02-09 ENCOUNTER — Encounter: Payer: Self-pay | Admitting: Podiatry

## 2016-02-09 ENCOUNTER — Ambulatory Visit (INDEPENDENT_AMBULATORY_CARE_PROVIDER_SITE_OTHER): Payer: Medicare Other

## 2016-02-09 ENCOUNTER — Ambulatory Visit (INDEPENDENT_AMBULATORY_CARE_PROVIDER_SITE_OTHER): Payer: Medicare Other | Admitting: Podiatry

## 2016-02-09 ENCOUNTER — Ambulatory Visit (INDEPENDENT_AMBULATORY_CARE_PROVIDER_SITE_OTHER): Payer: Medicare Other | Admitting: *Deleted

## 2016-02-09 DIAGNOSIS — E538 Deficiency of other specified B group vitamins: Secondary | ICD-10-CM | POA: Diagnosis not present

## 2016-02-09 DIAGNOSIS — R52 Pain, unspecified: Secondary | ICD-10-CM | POA: Diagnosis not present

## 2016-02-09 DIAGNOSIS — M779 Enthesopathy, unspecified: Secondary | ICD-10-CM

## 2016-02-09 MED ORDER — CYANOCOBALAMIN 1000 MCG/ML IJ SOLN
1000.0000 ug | Freq: Once | INTRAMUSCULAR | Status: AC
  Administered 2016-02-09: 1000 ug via INTRAMUSCULAR

## 2016-02-09 NOTE — Progress Notes (Signed)
Patient ID: Brooke Mitchell, female   DOB: 1951-02-26, 65 y.o.   MRN: [id number]  Subjective: 65 year old female presents the office today for follow-up evaluation of left foot pain. She states that overall she has improved since last appointment although she still gets some tenderness the top of her foot for which she points the medial Lisfranc joint. No overlying erythema or increase in warmth. She does that she notices difference when she wears a supportive shoe. She states the tendinitis area which she points the medial foot is improving as well. Denies any systemic complaints such as fevers, chills, nausea, vomiting. No acute changes since last appointment, and no other complaints at this time.   Objective: AAO x3, NAD DP/PT pulses palpable bilaterally, CRT less than 3 seconds There is small bony exostosis off the dorsal medial midfoot along the Lisfranc joint. There is tenderness overlying this area. There is no overlying edema, erythema. There is tenderness along the navicular tuberosity however this is decrease. No edema. Tendon appears intact. No areas of pinpoint bony tenderness or pain with vibratory sensation. MMT 5/5, ROM WNL. No edema, erythema, increase in warmth to bilateral lower extremities.  No open lesions or pre-ulcerative lesions.  No pain with calf compression, swelling, warmth, erythema  Assessment: Capsulitis, tendinitis  Plan: -All treatment options discussed with the patient including all alternatives, risks, complications.  -Discussed steroid injection over the area of maximal tenderness the left dorsal medial midfoot. She wishes to proceed. Under sterile conditions a mixture Kenalog localized was infiltrated to the area of maximal tenderness and post-injection care was discussed. -Discussed shoe gear modifications and orthotics. She will purchase an over-the-counter insert. -Patient encouraged to call the office with any questions, concerns, change in symptoms.    Celesta Gentile, DPM

## 2016-02-12 ENCOUNTER — Other Ambulatory Visit: Payer: Self-pay | Admitting: Family Medicine

## 2016-03-22 ENCOUNTER — Ambulatory Visit: Payer: Medicare Other | Admitting: Podiatry

## 2016-03-23 ENCOUNTER — Inpatient Hospital Stay: Payer: Medicare Other | Attending: Internal Medicine

## 2016-03-23 DIAGNOSIS — I10 Essential (primary) hypertension: Secondary | ICD-10-CM | POA: Insufficient documentation

## 2016-03-23 DIAGNOSIS — M1990 Unspecified osteoarthritis, unspecified site: Secondary | ICD-10-CM | POA: Insufficient documentation

## 2016-03-23 DIAGNOSIS — Z9989 Dependence on other enabling machines and devices: Secondary | ICD-10-CM | POA: Insufficient documentation

## 2016-03-23 DIAGNOSIS — E785 Hyperlipidemia, unspecified: Secondary | ICD-10-CM | POA: Insufficient documentation

## 2016-03-23 DIAGNOSIS — G4733 Obstructive sleep apnea (adult) (pediatric): Secondary | ICD-10-CM | POA: Insufficient documentation

## 2016-03-23 DIAGNOSIS — Z7982 Long term (current) use of aspirin: Secondary | ICD-10-CM | POA: Insufficient documentation

## 2016-03-23 DIAGNOSIS — E669 Obesity, unspecified: Secondary | ICD-10-CM | POA: Insufficient documentation

## 2016-03-23 DIAGNOSIS — Z79899 Other long term (current) drug therapy: Secondary | ICD-10-CM | POA: Insufficient documentation

## 2016-03-23 DIAGNOSIS — Z7984 Long term (current) use of oral hypoglycemic drugs: Secondary | ICD-10-CM | POA: Insufficient documentation

## 2016-03-23 DIAGNOSIS — D509 Iron deficiency anemia, unspecified: Secondary | ICD-10-CM | POA: Insufficient documentation

## 2016-03-23 DIAGNOSIS — E039 Hypothyroidism, unspecified: Secondary | ICD-10-CM | POA: Diagnosis not present

## 2016-03-23 DIAGNOSIS — E538 Deficiency of other specified B group vitamins: Secondary | ICD-10-CM | POA: Diagnosis not present

## 2016-03-23 DIAGNOSIS — R5383 Other fatigue: Secondary | ICD-10-CM | POA: Diagnosis not present

## 2016-03-23 DIAGNOSIS — E119 Type 2 diabetes mellitus without complications: Secondary | ICD-10-CM | POA: Insufficient documentation

## 2016-03-23 DIAGNOSIS — G4730 Sleep apnea, unspecified: Secondary | ICD-10-CM | POA: Insufficient documentation

## 2016-03-23 LAB — FERRITIN: Ferritin: 55 ng/mL (ref 11–307)

## 2016-03-23 LAB — CBC WITH DIFFERENTIAL/PLATELET
BASOS PCT: 1 %
Basophils Absolute: 0 10*3/uL (ref 0–0.1)
Eosinophils Absolute: 0.2 10*3/uL (ref 0–0.7)
Eosinophils Relative: 2 %
HEMATOCRIT: 41 % (ref 35.0–47.0)
HEMOGLOBIN: 14.2 g/dL (ref 12.0–16.0)
LYMPHS ABS: 1.8 10*3/uL (ref 1.0–3.6)
LYMPHS PCT: 24 %
MCH: 29.9 pg (ref 26.0–34.0)
MCHC: 34.7 g/dL (ref 32.0–36.0)
MCV: 86.2 fL (ref 80.0–100.0)
MONO ABS: 0.5 10*3/uL (ref 0.2–0.9)
MONOS PCT: 6 %
NEUTROS ABS: 5.1 10*3/uL (ref 1.4–6.5)
NEUTROS PCT: 67 %
Platelets: 241 10*3/uL (ref 150–440)
RBC: 4.76 MIL/uL (ref 3.80–5.20)
RDW: 12.9 % (ref 11.5–14.5)
WBC: 7.5 10*3/uL (ref 3.6–11.0)

## 2016-03-24 ENCOUNTER — Other Ambulatory Visit: Payer: Self-pay | Admitting: Family Medicine

## 2016-03-24 ENCOUNTER — Inpatient Hospital Stay (HOSPITAL_BASED_OUTPATIENT_CLINIC_OR_DEPARTMENT_OTHER): Payer: Medicare Other | Admitting: Internal Medicine

## 2016-03-24 ENCOUNTER — Inpatient Hospital Stay: Payer: Medicare Other

## 2016-03-24 ENCOUNTER — Other Ambulatory Visit: Payer: Self-pay | Admitting: *Deleted

## 2016-03-24 VITALS — BP 137/81 | HR 92 | Temp 98.3°F | Resp 18 | Wt 215.4 lb

## 2016-03-24 VITALS — BP 136/83 | HR 78

## 2016-03-24 DIAGNOSIS — I10 Essential (primary) hypertension: Secondary | ICD-10-CM | POA: Diagnosis not present

## 2016-03-24 DIAGNOSIS — R5383 Other fatigue: Secondary | ICD-10-CM | POA: Diagnosis not present

## 2016-03-24 DIAGNOSIS — G4733 Obstructive sleep apnea (adult) (pediatric): Secondary | ICD-10-CM

## 2016-03-24 DIAGNOSIS — E119 Type 2 diabetes mellitus without complications: Secondary | ICD-10-CM

## 2016-03-24 DIAGNOSIS — Z7984 Long term (current) use of oral hypoglycemic drugs: Secondary | ICD-10-CM

## 2016-03-24 DIAGNOSIS — Z7982 Long term (current) use of aspirin: Secondary | ICD-10-CM

## 2016-03-24 DIAGNOSIS — E785 Hyperlipidemia, unspecified: Secondary | ICD-10-CM

## 2016-03-24 DIAGNOSIS — D509 Iron deficiency anemia, unspecified: Secondary | ICD-10-CM

## 2016-03-24 DIAGNOSIS — M1990 Unspecified osteoarthritis, unspecified site: Secondary | ICD-10-CM

## 2016-03-24 DIAGNOSIS — D538 Other specified nutritional anemias: Secondary | ICD-10-CM | POA: Diagnosis not present

## 2016-03-24 DIAGNOSIS — Z9989 Dependence on other enabling machines and devices: Secondary | ICD-10-CM

## 2016-03-24 DIAGNOSIS — E039 Hypothyroidism, unspecified: Secondary | ICD-10-CM

## 2016-03-24 DIAGNOSIS — Z79899 Other long term (current) drug therapy: Secondary | ICD-10-CM

## 2016-03-24 MED ORDER — SODIUM CHLORIDE 0.9 % IV SOLN
INTRAVENOUS | Status: DC
  Administered 2016-03-24: 15:00:00 via INTRAVENOUS
  Filled 2016-03-24: qty 1000

## 2016-03-24 MED ORDER — SODIUM CHLORIDE 0.9 % IV SOLN
200.0000 mg | INTRAVENOUS | Status: DC
  Administered 2016-03-24: 200 mg via INTRAVENOUS
  Filled 2016-03-24: qty 10

## 2016-03-24 NOTE — Telephone Encounter (Signed)
Last filled on 10/26/2015 #90 with 0 refills, pt has a f/u appt with you scheduled on 06/24/16, please advise

## 2016-03-24 NOTE — Progress Notes (Signed)
[facility] OFFICE PROGRESS NOTE  Patient Care Team: Abner Greenspan, MD as PCP - General Kathee Delton, MD as Attending Physician (Pulmonary Disease)   SUMMARY OF HEMATOLOGY-ONCOLOGIC HISTORY:  # 2014 [atleast] Iron deficiency Anemia [? Malabsorption; 2016- EGD/[place]-neg for bleeding; IV Venofer x2 [2016]; on PO iron TID  # March 2016- B12 def- on B12 injections [neg- intrinsic antibodies]  INTERVAL HISTORY:  A very pleasant 65 year old female patient with above history of iron deficiency anemia is here for follow-up. She complains of fatigue. She is on by mouth iron. She denies any constipation or diarrhea. No difficulty swallowing. No blood in stools.    REVIEW OF SYSTEMS:  A complete 10 point review of system is done which is negative except mentioned above/history of present illness.   PAST MEDICAL HISTORY :  Past Medical History  Diagnosis Date  . Allergic rhinitis   . Asthma     As a child   . GERD (gastroesophageal reflux disease)   . Hyperlipidemia   . Hypertension   . Hypothyroidism   . Diabetes mellitus     Type II (2/06 elevated microalbumin)  . Obesity   . OSA (obstructive sleep apnea)     CPAP  . Frequent UTI     with coital prohylaxis   . IDA (iron deficiency anemia)   . Allergy   . Sleep apnea     wears c-pap  . Anxiety   . Arthritis     osteoarthritis  . Depression     PAST SURGICAL HISTORY :   Past Surgical History  Procedure Laterality Date  . Foot surgery    . Endometrial biopsy    . Dilation and curettage of uterus    . Colonoscopy    . Upper gastrointestinal endoscopy      FAMILY HISTORY :   Family History  Problem Relation Age of Onset  . Diabetes Mother   . Coronary artery disease Mother   . Hypothyroidism Mother   . Irritable bowel syndrome Mother   . Alzheimer's disease Father   . Nephrolithiasis Father   . Hypothyroidism Brother   . Colon cancer Neg Hx   . Esophageal cancer Neg Hx   . Rectal cancer Neg Hx    . Stomach cancer Neg Hx     SOCIAL HISTORY:   Social History  Substance Use Topics  . Smoking status: Never Smoker   . Smokeless tobacco: Never Used  . Alcohol Use: 0.0 oz/week    0 Standard drinks or equivalent per week     Comment: occasional    ALLERGIES:  has No Known Allergies.  MEDICATIONS:  Current Outpatient Prescriptions  Medication Sig Dispense Refill  . acetaminophen (TYLENOL) 325 MG tablet Take 650 mg by mouth as needed for pain.    [place] ADVAIR DISKUS 100-50 MCG/DOSE AEPB USE 1 PUFF TWICE DAILY DURING ALLERGY SEASON 60 each 1  . albuterol (PROVENTIL,VENTOLIN) 90 MCG/ACT inhaler Inhale 2 puffs into the lungs every 4 (four) hours as needed.      . Ascorbic Acid (VITAMIN C) 100 MG tablet Take 100 mg by mouth daily.    Marland Kitchen aspirin 81 MG tablet Take 81 mg by mouth daily.      Marland Kitchen atorvastatin (LIPITOR) 20 MG tablet TAKE 1/2 TABLET (10 MG TOTAL) BY MOUTH DAILY. 15 tablet 5  . calcium carbonate (CALCIUM 600) 600 MG TABS Take 600 mg by mouth 2 (two) times daily with a meal.      .  Cholecalciferol 4000 UNITS CAPS Take 4,000 Units by mouth daily. Pt takes 2 tablets of 2,000 units per day = 4,000 units a day    . cyanocobalamin (,VITAMIN B-12,) 1000 MCG/ML injection Inject 1,000 mcg into the muscle. Once every 2 months    . Cyanocobalamin (VITAMIN B-12 PO) Take 6 mcg by mouth daily.    Marland Kitchen FLUoxetine (PROZAC) 20 MG capsule Take 1 capsule (20 mg total) by mouth daily. 30 capsule 3  . fosinopril-hydrochlorothiazide (MONOPRIL-HCT) 10-12.5 MG tablet TAKE 1 TABLET BY MOUTH DAILY. 30 tablet 5  . GLIPIZIDE XL 10 MG 24 hr tablet TAKE 1 TABLET (10 MG TOTAL) BY MOUTH DAILY. 30 tablet 5  . hyoscyamine (LEVSIN SL) 0.125 MG SL tablet Place one tablet under tongue once a day as needed. 30 tablet 1  . IRON, FERROUS GLUCONATE, PO Take 1 tablet by mouth 3 (three) times daily.     Marland Kitchen loratadine (CLARITIN) 10 MG tablet Take 10 mg by mouth daily.      . meloxicam (MOBIC) 15 MG tablet TAKE 1 TABLET BY MOUTH  DAILY AS NEEDED WITH FOOD 90 tablet 0  . metFORMIN (GLUCOPHAGE) 1000 MG tablet TAKE 1 TABLET (1,000 MG TOTAL) BY MOUTH 2 (TWO) TIMES DAILY WITH A MEAL. 60 tablet 5  . multivitamin (THERAGRAN) per tablet Take 1 tablet by mouth daily.      . NONFORMULARY OR COMPOUNDED [place] compound:  Antiinflammatory cream - Diclofenac 3%, Baclofen 2%, Cyclobenzaprine 2%, Lidocaine 2%, dispense 120 grams, apply 1-2 grams to affected area 3-4 times daily, +3refills. 120 each 3  . ONE TOUCH ULTRA TEST test strip CHECK SUGAR TWICE A DAY AS INSTRUCTED (DM 250.0) THAT IS NOT OPTIMALLYCONTROLLED 100 each 0  . PROAIR HFA 108 (90 BASE) MCG/ACT inhaler USE 2 PUFFS UPTO EVERY 4 HOURS AS NEEDED FOR WHEEZING 8.5 g 1  . SYNTHROID 50 MCG tablet TAKE 1 TABLET (50 MCG TOTAL) BY MOUTH DAILY. 30 tablet 5   No current facility-administered medications for this visit.   Facility-Administered Medications Ordered in Other Visits  Medication Dose Route Frequency Provider Last Rate Last Dose  . 0.9 %  sodium chloride infusion   Intravenous Continuous Cammie Sickle, MD   Stopped at 03/24/16 1620  . iron sucrose (VENOFER) 200 mg in sodium chloride 0.9 % 100 mL IVPB  200 mg Intravenous Q6 months Cammie Sickle, MD   Stopped at 03/24/16 1600    PHYSICAL EXAMINATION:   BP 137/81 mmHg  Pulse 92  Temp(Src) 98.3 F (36.8 C) (Tympanic)  Resp 18  Wt 215 lb 6 oz (97.693 kg)  Filed Weights   03/24/16 1426  Weight: 215 lb 6 oz (97.693 kg)    GENERAL: Well-nourished well-developed; Alert, no distress and comfortable.   EYES: no pallor or icterus OROPHARYNX: no thrush or ulceration; good dentition  NECK: supple, no masses felt LYMPH:  no palpable lymphadenopathy in the cervical, axillary or inguinal regions LUNGS: clear to auscultation and  No wheeze or crackles HEART/CVS: regular rate & rhythm and no murmurs; No lower extremity edema ABDOMEN:abdomen soft, non-tender and normal bowel  sounds Musculoskeletal:no cyanosis of digits and no clubbing  PSYCH: alert & oriented x 3 with fluent speech NEURO: no focal motor/sensory deficits SKIN:  no rashes or significant lesions  LABORATORY DATA:  I have reviewed the data as listed    Component Value Date/Time   NA 142 12/16/2015 0905   K 4.1 12/16/2015 0905   CL 104 12/16/2015 0905  CO2 31 12/16/2015 0905   GLUCOSE 83 12/16/2015 0905   BUN 21 12/16/2015 0905   CREATININE 0.82 12/16/2015 0905   CREATININE 0.78 02/08/2013 1429   CALCIUM 9.8 12/16/2015 0905   PROT 6.9 12/16/2015 0905   ALBUMIN 4.2 12/16/2015 0905   AST 17 12/16/2015 0905   ALT 19 12/16/2015 0905   ALKPHOS 52 12/16/2015 0905   BILITOT 0.7 12/16/2015 0905   GFRNONAA 84.03 03/16/2010 0818   GFRAA 95 09/15/2008 0846    No results found for: SPEP, UPEP  Lab Results  Component Value Date   WBC 7.5 03/23/2016   NEUTROABS 5.1 03/23/2016   HGB 14.2 03/23/2016   HCT 41.0 03/23/2016   MCV 86.2 03/23/2016   PLT 241 03/23/2016      Chemistry      Component Value Date/Time   NA 142 12/16/2015 0905   K 4.1 12/16/2015 0905   CL 104 12/16/2015 0905   CO2 31 12/16/2015 0905   BUN 21 12/16/2015 0905   CREATININE 0.82 12/16/2015 0905   CREATININE 0.78 02/08/2013 1429      Component Value Date/Time   CALCIUM 9.8 12/16/2015 0905   ALKPHOS 52 12/16/2015 0905   AST 17 12/16/2015 0905   ALT 19 12/16/2015 0905   BILITOT 0.7 12/16/2015 0905       RADIOGRAPHIC STUDIES: I have personally reviewed the radiological images as listed and agreed with the findings in the report. No results found.   ASSESSMENT & PLAN:  Anemia, iron deficiency # Iron deficiency anemia- chronic question etiology; possible malabsorption. Continue iron 3 times a day with twice a day vitamin C. Today hemoglobin is 14 ferritin-50s trending down. Patient's fatigue. Recommend IV iron today.  # Follow-up in 6 months/with possible IV iron infusion/CBC/ferritin day prior. Patient was  given a copy of her blood counts from today.  # B12 deficiency- continue IM B12 injections.      Cammie Sickle, MD 03/24/2016 4:33 PM

## 2016-03-24 NOTE — Assessment & Plan Note (Addendum)
#   Iron deficiency anemia- chronic question etiology; possible malabsorption. Continue iron 3 times a day with twice a day vitamin C. Today hemoglobin is 14 ferritin-50s trending down. Patient's fatigue. Recommend IV iron today.  # Follow-up in 6 months/with possible IV iron infusion/CBC/ferritin day prior. Patient was given a copy of her blood counts from today.  # B12 deficiency- continue IM B12 injections.

## 2016-03-25 NOTE — Telephone Encounter (Signed)
done

## 2016-03-25 NOTE — Telephone Encounter (Signed)
Please refill times 2 

## 2016-04-12 ENCOUNTER — Ambulatory Visit (INDEPENDENT_AMBULATORY_CARE_PROVIDER_SITE_OTHER): Payer: Medicare Other

## 2016-04-12 ENCOUNTER — Encounter: Payer: Self-pay | Admitting: Family Medicine

## 2016-04-12 ENCOUNTER — Ambulatory Visit (INDEPENDENT_AMBULATORY_CARE_PROVIDER_SITE_OTHER): Payer: Medicare Other | Admitting: Family Medicine

## 2016-04-12 DIAGNOSIS — E538 Deficiency of other specified B group vitamins: Secondary | ICD-10-CM | POA: Diagnosis not present

## 2016-04-12 DIAGNOSIS — M545 Low back pain, unspecified: Secondary | ICD-10-CM | POA: Insufficient documentation

## 2016-04-12 MED ORDER — CYANOCOBALAMIN 1000 MCG/ML IJ SOLN
1000.0000 ug | Freq: Once | INTRAMUSCULAR | Status: AC
  Administered 2016-04-12: 1000 ug via INTRAMUSCULAR

## 2016-04-12 MED ORDER — METHOCARBAMOL 500 MG PO TABS: 500.0000 mg | ORAL_TABLET | Freq: Three times a day (TID) | ORAL | 0 refills | Status: DC | PRN

## 2016-04-12 NOTE — Assessment & Plan Note (Signed)
L lower back pain without radiation. Exam consistent predominantly with L sacroiliitis as well as L bursitis and L lumbar strain. Pt already taking mobic 15mg  daily. Will add robaxin muscle relaxant (discussed sedation precautions) and sacroiliac pain SM handout for exercises.  Continue tylenol and heating pad. Update if not improving with treatment.

## 2016-04-12 NOTE — Patient Instructions (Addendum)
You do have inflammation of sacroiliac joint on left as well as strain of lumbar spine and bursitis of hip. Treat with continued mobic, start robaxin muscle relaxant 500mg  three times daily as needed - caution with sedation - and do stretching exercises provided today for sacroiliitis.  May continue tylenol and heating pad Let us know if not improving with muscle relaxant. Stay as active as able.

## 2016-04-12 NOTE — Progress Notes (Signed)
BP 126/80 (BP Location: Left Arm)   Pulse 88   Temp 98.2 F (36.8 C) (Oral)   Ht 5' 4.5" (1.638 m)   Wt 215 lb 4 oz (97.6 kg)   BMI 36.38 kg/m    CC: back pain Subjective:    Patient ID: Darci Current, female    DOB: 16-Sep-1950, 65 y.o.   MRN: [id number]  HPI: MAIGAN BREMNER is a 65 y.o. female presenting on 04/12/2016 for Hip Pain (left hip, started Sunday morning)   3d h/o L lower ack pain when she awoke, worse with walking. No radiation of pain. No groin or lateral hip pain. Denies inciting trauma/injury.   Denies fevers, numbness, weakness, bowel or bladder incontinence.   Tried tramadol which has helped some but knocked her out - fell asleep for several hours. Also using heating pad and cold, using tylenol.  She regularly takes meloxicam 15mg  daily for osteoarthritis.  Lab Results  Component Value Date   CREATININE 0.82 12/16/2015    About to restart school as Pharmacist, hospital.   Relevant past medical, surgical, family and social history reviewed and updated as indicated. Interim medical history since our last visit reviewed. Allergies and medications reviewed and updated. Current Outpatient Prescriptions on File Prior to Visit  Medication Sig  . acetaminophen (TYLENOL) 325 MG tablet Take 650 mg by mouth as needed for pain.  Marland Kitchen ADVAIR DISKUS 100-50 MCG/DOSE AEPB USE 1 PUFF TWICE DAILY DURING ALLERGY SEASON  . albuterol (PROVENTIL,VENTOLIN) 90 MCG/ACT inhaler Inhale 2 puffs into the lungs every 4 (four) hours as needed.    . Ascorbic Acid (VITAMIN C) 100 MG tablet Take 100 mg by mouth daily.  Marland Kitchen aspirin 81 MG tablet Take 81 mg by mouth daily.    Marland Kitchen atorvastatin (LIPITOR) 20 MG tablet TAKE 1/2 TABLET (10 MG TOTAL) BY MOUTH DAILY.  . calcium carbonate (CALCIUM 600) 600 MG TABS Take 600 mg by mouth 2 (two) times daily with a meal.    . Cholecalciferol 4000 UNITS CAPS Take 4,000 Units by mouth daily. Pt takes 2 tablets of 2,000 units per day = 4,000 units a day  . cyanocobalamin  (,VITAMIN B-12,) 1000 MCG/ML injection Inject 1,000 mcg into the muscle. Once every 2 months  . Cyanocobalamin (VITAMIN B-12 PO) Take 6 mcg by mouth daily.  Marland Kitchen FLUoxetine (PROZAC) 20 MG capsule Take 1 capsule (20 mg total) by mouth daily.  . fosinopril-hydrochlorothiazide (MONOPRIL-HCT) 10-12.5 MG tablet TAKE 1 TABLET BY MOUTH DAILY.  Marland Kitchen GLIPIZIDE XL 10 MG 24 hr tablet TAKE 1 TABLET (10 MG TOTAL) BY MOUTH DAILY.  . hyoscyamine (LEVSIN SL) 0.125 MG SL tablet Place one tablet under tongue once a day as needed.  . IRON, FERROUS GLUCONATE, PO Take 1 tablet by mouth 3 (three) times daily.   Marland Kitchen loratadine (CLARITIN) 10 MG tablet Take 10 mg by mouth daily.    . meloxicam (MOBIC) 15 MG tablet TAKE 1 TABLET BY MOUTH DAILY AS NEEDED WITH FOOD  . metFORMIN (GLUCOPHAGE) 1000 MG tablet TAKE 1 TABLET (1,000 MG TOTAL) BY MOUTH 2 (TWO) TIMES DAILY WITH A MEAL.  . multivitamin (THERAGRAN) per tablet Take 1 tablet by mouth daily.    . NONFORMULARY OR COMPOUNDED [place] compound:  Antiinflammatory cream - Diclofenac 3%, Baclofen 2%, Cyclobenzaprine 2%, Lidocaine 2%, dispense 120 grams, apply 1-2 grams to affected area 3-4 times daily, +3refills.  . ONE TOUCH ULTRA TEST test strip CHECK SUGAR TWICE A DAY AS INSTRUCTED (DM 250.0) THAT  IS NOT OPTIMALLYCONTROLLED  . PROAIR HFA 108 (90 BASE) MCG/ACT inhaler USE 2 PUFFS UPTO EVERY 4 HOURS AS NEEDED FOR WHEEZING  . SYNTHROID 50 MCG tablet TAKE 1 TABLET (50 MCG TOTAL) BY MOUTH DAILY.   No current facility-administered medications on file prior to visit.     Review of Systems Per HPI unless specifically indicated in ROS section     Objective:    BP 126/80 (BP Location: Left Arm)   Pulse 88   Temp 98.2 F (36.8 C) (Oral)   Ht 5' 4.5" (1.638 m)   Wt 215 lb 4 oz (97.6 kg)   BMI 36.38 kg/m   Wt Readings from Last 3 Encounters:  04/12/16 215 lb 4 oz (97.6 kg)  03/24/16 215 lb 6 oz (97.7 kg)  12/23/15 209 lb 4 oz (94.9 kg)    Physical Exam    Constitutional: She appears well-developed and well-nourished. No distress.  Musculoskeletal: Normal range of motion. She exhibits no edema.  ++ pain midline lumbar spine +  L lumbar paraspinous mm tenderness Neg SLR bilaterally. No pain with int/ext rotation at hip. Neg FABER. + pain at L SIJ, GTB and sciatic notch.   Neurological:  5/5 strength Sensation intact  Skin: Skin is warm and dry. No rash noted.  Nursing note and vitals reviewed.     Assessment & Plan:   Problem List Items Addressed This Visit    Left low back pain    L lower back pain without radiation. Exam consistent predominantly with L sacroiliitis as well as L bursitis and L lumbar strain. Pt already taking mobic 15mg  daily. Will add robaxin muscle relaxant (discussed sedation precautions) and sacroiliac pain SM handout for exercises.  Continue tylenol and heating pad. Update if not improving with treatment.       Relevant Medications   methocarbamol (ROBAXIN) 500 MG tablet    Other Visit Diagnoses   None.      Follow up plan: Return if symptoms worsen or fail to improve.  Ria Bush, MD

## 2016-04-12 NOTE — Progress Notes (Signed)
Pre visit review using our clinic review tool, if applicable. No additional management support is needed unless otherwise documented below in the visit note. 

## 2016-04-22 ENCOUNTER — Other Ambulatory Visit: Payer: Self-pay | Admitting: Family Medicine

## 2016-06-14 ENCOUNTER — Ambulatory Visit (INDEPENDENT_AMBULATORY_CARE_PROVIDER_SITE_OTHER): Payer: Medicare Other | Admitting: *Deleted

## 2016-06-14 ENCOUNTER — Ambulatory Visit (INDEPENDENT_AMBULATORY_CARE_PROVIDER_SITE_OTHER): Payer: Medicare Other

## 2016-06-14 DIAGNOSIS — E538 Deficiency of other specified B group vitamins: Secondary | ICD-10-CM | POA: Diagnosis not present

## 2016-06-14 DIAGNOSIS — Z23 Encounter for immunization: Secondary | ICD-10-CM

## 2016-06-14 MED ORDER — CYANOCOBALAMIN 1000 MCG/ML IJ SOLN
1000.0000 ug | INTRAMUSCULAR | Status: DC
  Administered 2016-06-14 – 2016-08-24 (×2): 1000 ug via INTRAMUSCULAR

## 2016-06-21 ENCOUNTER — Other Ambulatory Visit: Payer: BC Managed Care – PPO

## 2016-06-23 ENCOUNTER — Other Ambulatory Visit (INDEPENDENT_AMBULATORY_CARE_PROVIDER_SITE_OTHER): Payer: Medicare Other

## 2016-06-23 DIAGNOSIS — I10 Essential (primary) hypertension: Secondary | ICD-10-CM | POA: Diagnosis not present

## 2016-06-23 DIAGNOSIS — E119 Type 2 diabetes mellitus without complications: Secondary | ICD-10-CM | POA: Diagnosis not present

## 2016-06-23 DIAGNOSIS — E785 Hyperlipidemia, unspecified: Secondary | ICD-10-CM

## 2016-06-23 LAB — COMPREHENSIVE METABOLIC PANEL
ALBUMIN: 4.1 g/dL (ref 3.5–5.2)
ALT: 25 U/L (ref 0–35)
AST: 19 U/L (ref 0–37)
Alkaline Phosphatase: 52 U/L (ref 39–117)
BILIRUBIN TOTAL: 0.6 mg/dL (ref 0.2–1.2)
BUN: 19 mg/dL (ref 6–23)
CALCIUM: 9.6 mg/dL (ref 8.4–10.5)
CHLORIDE: 103 meq/L (ref 96–112)
CO2: 30 mEq/L (ref 19–32)
CREATININE: 0.79 mg/dL (ref 0.40–1.20)
GFR: 77.54 mL/min (ref >60.00)
Glucose, Bld: 111 mg/dL — ABNORMAL HIGH (ref 70–99)
Potassium: 4 mEq/L (ref 3.5–5.1)
SODIUM: 141 meq/L (ref 135–145)
TOTAL PROTEIN: 6.7 g/dL (ref 6.0–8.3)

## 2016-06-23 LAB — LIPID PANEL
CHOL/HDL RATIO: 4
CHOLESTEROL: 156 mg/dL (ref 0–200)
HDL: 43.8 mg/dL (ref >39.00)
NonHDL: 111.91
TRIGLYCERIDES: 209 mg/dL — AB (ref 0.0–149.0)
VLDL: 41.8 mg/dL — ABNORMAL HIGH (ref 0.0–40.0)

## 2016-06-23 LAB — HEMOGLOBIN A1C: Hgb A1c MFr Bld: 6.7 % — ABNORMAL HIGH (ref 4.6–6.5)

## 2016-06-23 LAB — LDL CHOLESTEROL, DIRECT: LDL DIRECT: 92 mg/dL

## 2016-06-24 ENCOUNTER — Encounter: Payer: Self-pay | Admitting: Family Medicine

## 2016-06-24 ENCOUNTER — Ambulatory Visit (INDEPENDENT_AMBULATORY_CARE_PROVIDER_SITE_OTHER): Payer: Medicare Other | Admitting: Family Medicine

## 2016-06-24 VITALS — BP 106/62 | HR 82 | Temp 98.2°F | Ht 64.5 in | Wt 221.5 lb

## 2016-06-24 DIAGNOSIS — I10 Essential (primary) hypertension: Secondary | ICD-10-CM | POA: Diagnosis not present

## 2016-06-24 DIAGNOSIS — E119 Type 2 diabetes mellitus without complications: Secondary | ICD-10-CM | POA: Diagnosis not present

## 2016-06-24 DIAGNOSIS — Z6837 Body mass index (BMI) 37.0-37.9, adult: Secondary | ICD-10-CM

## 2016-06-24 DIAGNOSIS — E6609 Other obesity due to excess calories: Secondary | ICD-10-CM | POA: Diagnosis not present

## 2016-06-24 DIAGNOSIS — E7800 Pure hypercholesterolemia, unspecified: Secondary | ICD-10-CM

## 2016-06-24 DIAGNOSIS — IMO0001 Reserved for inherently not codable concepts without codable children: Secondary | ICD-10-CM

## 2016-06-24 DIAGNOSIS — M79672 Pain in left foot: Secondary | ICD-10-CM

## 2016-06-24 NOTE — Assessment & Plan Note (Signed)
Discussed how this problem influences overall health and the risks it imposes  Reviewed plan for weight loss with lower calorie diet (via better food choices and also portion control or program like weight watchers) and exercise building up to or more than 30 minutes 5 days per week including some aerobic activity   Plan made

## 2016-06-24 NOTE — Assessment & Plan Note (Signed)
bp in fair control at this time  BP Readings from Last 1 Encounters:  06/24/16 106/62   No changes needed Disc lifstyle change with low sodium diet and exercise  Enc wt loss  Reviewed labs

## 2016-06-24 NOTE — Assessment & Plan Note (Signed)
Lab Results  Component Value Date   HGBA1C 6.7 (H) 06/23/2016   This is up due to worse diet and wt gain [facility]  Also adv to cut the carbs/ processed foods and sweets, also reduce portions for wt loss  F/u 6 mo  Continue metformin and glipizide

## 2016-06-24 NOTE — Assessment & Plan Note (Signed)
Disc goals for lipids and reasons to control them Rev labs with pt Rev low sat fat diet in detail Triglycerides are up likely due to inc blood glucose  Plan made for diet change Continue atorvastatin  Re check 6 mo

## 2016-06-24 NOTE — Progress Notes (Signed)
Subjective:    Patient ID: Brooke Mitchell, female    DOB: April 17, 1951, 65 y.o.   MRN: [id number]  HPI Here for f/u of chronic medical problems  Doing ok/feeling ok   Wt Readings from Last 3 Encounters:  06/24/16 221 lb 8 oz (100.5 kg)  04/12/16 215 lb 4 oz (97.6 kg)  03/24/16 215 lb 6 oz (97.7 kg)  wt is up 6 lb -she has been eating more lately - not paying attention  Exercise - [facility] , [facility]  She loves to swim as well , will be able to next semester  bmi is 37.4  bp is stable today  No cp or palpitations or headaches or edema  No side effects to medicines  BP Readings from Last 3 Encounters:  06/24/16 106/62  04/12/16 126/80  03/24/16 136/83     Diabetes Home sugar results - not checking lately  DM diet -carbs and sweets more lately - thinks she can dial it back  [facility] [employer] Symptoms-none  A1C last  Lab Results  Component Value Date   HGBA1C 6.7 (H) 06/23/2016  this is up from 6.1 On metformin and glipizide No problems with medications  Renal protection- ace  Last eye exam  12/16- she already has next one scheduled   Had the flu shot last week with her B12 shot   Off prozac/doing fine w/o it   Hx of hyperlipidemia  On lipitor and diet Lab Results  Component Value Date   CHOL 156 06/23/2016   CHOL 163 12/16/2015   CHOL 167 11/26/2014   Lab Results  Component Value Date   HDL 43.80 06/23/2016   HDL 44.20 12/16/2015   HDL 43.90 11/26/2014   Lab Results  Component Value Date   LDLCALC 80 12/16/2015   LDLCALC 77 01/20/2014   LDLCALC 98 07/08/2013   Lab Results  Component Value Date   TRIG 209.0 (H) 06/23/2016   TRIG 194.0 (H) 12/16/2015   TRIG 221.0 (H) 11/26/2014   Lab Results  Component Value Date   CHOLHDL 4 06/23/2016   CHOLHDL 4 12/16/2015   CHOLHDL 4 11/26/2014   Lab Results  Component Value Date   LDLDIRECT 92.0 06/23/2016   LDLDIRECT 98.0 11/26/2014    LDLDIRECT 78.4 03/16/2010  trig likely up due to glucose being up   Her L foot still hurts  Had a cortisone shot from podiatry  Hurts over first metatarsal  Had xrays  Wearing otc orthotics   Patient Active Problem List   Diagnosis Date Noted  . Foot pain, left 12/23/2015  . Urge incontinence 12/23/2015  . Anemia, iron deficiency 02/19/2015  . Vitamin B 12 deficiency 12/05/2014  . Fatigue 11/26/2014  . Grief reaction 01/22/2014  . Colon cancer screening 10/24/2012  . Anemia 10/24/2012  . Trochanteric bursitis of left hip 11/14/2011  . Obesity 10/19/2011  . Other screening mammogram 04/13/2011  . Routine general medical examination at a health care facility 04/07/2011  . UTI'S, RECURRENT 02/03/2010  . POSTMENOPAUSAL STATUS 03/28/2008  . Hypothyroidism 11/24/2006  . Diabetes type 2, controlled ([place]) 11/24/2006  . Hyperlipidemia 11/24/2006  . RESTLESS LEG SYNDROME 11/24/2006  . Essential hypertension 11/24/2006  . PREMATURE VENTRICULAR CONTRACTIONS 11/24/2006  . HEMORRHOIDS 11/24/2006  . ALLERGIC RHINITIS 11/24/2006  . ASTHMA 11/24/2006  . GERD 11/24/2006  . HIATAL HERNIA 11/24/2006  . OVERACTIVE BLADDER 11/24/2006  . [place] DISEASE, CERVICAL 11/24/2006  .  Sleep apnea 11/24/2006   Past Medical History:  Diagnosis Date  . Allergic rhinitis   . Allergy   . Anxiety   . Arthritis    osteoarthritis  . Asthma    As a child   . Depression   . Diabetes mellitus    Type II (2/06 elevated microalbumin)  . Frequent UTI    with coital prohylaxis   . GERD (gastroesophageal reflux disease)   . Hyperlipidemia   . Hypertension   . Hypothyroidism   . IDA (iron deficiency anemia)   . Obesity   . OSA (obstructive sleep apnea)    CPAP  . Sleep apnea    wears c-pap   Past Surgical History:  Procedure Laterality Date  . COLONOSCOPY    . DILATION AND CURETTAGE OF UTERUS    . ENDOMETRIAL BIOPSY    . FOOT SURGERY    . UPPER GASTROINTESTINAL ENDOSCOPY     Social History    Substance Use Topics  . Smoking status: Never Smoker  . Smokeless tobacco: Never Used  . Alcohol use 0.0 oz/week     Comment: occasional   Family History  Problem Relation Age of Onset  . Diabetes Mother   . Coronary artery disease Mother   . Hypothyroidism Mother   . Irritable bowel syndrome Mother   . Alzheimer's disease Father   . Nephrolithiasis Father   . Hypothyroidism Brother   . Colon cancer Neg Hx   . Esophageal cancer Neg Hx   . Rectal cancer Neg Hx   . Stomach cancer Neg Hx    No Known Allergies Current Outpatient Prescriptions on File Prior to Visit  Medication Sig Dispense Refill  . acetaminophen (TYLENOL) 325 MG tablet Take 650 mg by mouth as needed for pain.    Marland Kitchen ADVAIR DISKUS 100-50 MCG/DOSE AEPB USE 1 PUFF TWICE DAILY DURING ALLERGY SEASON 60 each 1  . albuterol (PROVENTIL,VENTOLIN) 90 MCG/ACT inhaler Inhale 2 puffs into the lungs every 4 (four) hours as needed.      . Ascorbic Acid (VITAMIN C) 100 MG tablet Take 100 mg by mouth daily.    Marland Kitchen aspirin 81 MG tablet Take 81 mg by mouth daily.      Marland Kitchen atorvastatin (LIPITOR) 20 MG tablet TAKE 1/2 TABLET (10 MG TOTAL) BY MOUTH DAILY. 15 tablet 5  . calcium carbonate (CALCIUM 600) 600 MG TABS Take 600 mg by mouth 2 (two) times daily with a meal.      . Cholecalciferol 4000 UNITS CAPS Take 4,000 Units by mouth daily. Pt takes 2 tablets of 2,000 units per day = 4,000 units a day    . cyanocobalamin (,VITAMIN B-12,) 1000 MCG/ML injection Inject 1,000 mcg into the muscle. Once every 2 months    . Cyanocobalamin (VITAMIN B-12 PO) Take 6 mcg by mouth daily.    . fosinopril-hydrochlorothiazide (MONOPRIL-HCT) 10-12.5 MG tablet TAKE 1 TABLET BY MOUTH DAILY. 30 tablet 5  . GLIPIZIDE XL 10 MG 24 hr tablet TAKE 1 TABLET (10 MG TOTAL) BY MOUTH DAILY. 30 tablet 5  . hyoscyamine (LEVSIN SL) 0.125 MG SL tablet Place one tablet under tongue once a day as needed. 30 tablet 1  . IRON, FERROUS GLUCONATE, PO Take 1 tablet by mouth 3 (three)  times daily.     Marland Kitchen loratadine (CLARITIN) 10 MG tablet Take 10 mg by mouth daily.      . meloxicam (MOBIC) 15 MG tablet TAKE 1 TABLET BY MOUTH DAILY AS NEEDED WITH FOOD 90  tablet 1  . metFORMIN (GLUCOPHAGE) 1000 MG tablet TAKE 1 TABLET (1,000 MG TOTAL) BY MOUTH 2 (TWO) TIMES DAILY WITH A MEAL. 60 tablet 5  . methocarbamol (ROBAXIN) 500 MG tablet Take 1 tablet (500 mg total) by mouth 3 (three) times daily as needed for muscle spasms. 40 tablet 0  . multivitamin (THERAGRAN) per tablet Take 1 tablet by mouth daily.      . NONFORMULARY OR COMPOUNDED [place] compound:  Antiinflammatory cream - Diclofenac 3%, Baclofen 2%, Cyclobenzaprine 2%, Lidocaine 2%, dispense 120 grams, apply 1-2 grams to affected area 3-4 times daily, +3refills. 120 each 3  . ONE TOUCH ULTRA TEST test strip CHECK SUGAR TWICE A DAY AS INSTRUCTED (DM 250.0) THAT IS NOT OPTIMALLYCONTROLLED 100 each 0  . PROAIR HFA 108 (90 BASE) MCG/ACT inhaler USE 2 PUFFS UPTO EVERY 4 HOURS AS NEEDED FOR WHEEZING 8.5 g 1  . SYNTHROID 50 MCG tablet TAKE 1 TABLET (50 MCG TOTAL) BY MOUTH DAILY. (Patient taking differently: TAKE 1/2 TABLET (25 MCG TOTAL) BY MOUTH DAILY.) 30 tablet 5   Current Facility-Administered Medications on File Prior to Visit  Medication Dose Route Frequency Provider Last Rate Last Dose  . cyanocobalamin ((VITAMIN B-12)) injection 1,000 mcg  1,000 mcg Intramuscular Q30 days Abner Greenspan, MD   1,000 mcg at 06/14/16 1544     Review of Systems Review of Systems  Constitutional: Negative for fever, appetite change,  and unexpected weight change. pos for fatigue that is some improved with better anemia  Eyes: Negative for pain and visual disturbance.  Respiratory: Negative for cough and shortness of breath.   Cardiovascular: Negative for cp or palpitations    Gastrointestinal: Negative for nausea, diarrhea and constipation.  Genitourinary: Negative for urgency and frequency.  Skin: Negative for pallor or rash     Neurological: Negative for weakness, light-headedness, numbness and headaches.  Hematological: Negative for adenopathy. Does not bruise/bleed easily.  Psychiatric/Behavioral: Negative for dysphoric mood. The patient is not nervous/anxious.         Objective:   Physical Exam  Constitutional: She appears well-developed and well-nourished. No distress.  obese and well appearing   HENT:  Head: Normocephalic and atraumatic.  Mouth/Throat: Oropharynx is clear and moist.  Eyes: Conjunctivae and EOM are normal. Pupils are equal, round, and reactive to light.  Neck: Normal range of motion. Neck supple. No JVD present. Carotid bruit is not present. No thyromegaly present.  Cardiovascular: Normal rate, regular rhythm, normal heart sounds and intact distal pulses.  Exam reveals no gallop.   Pulmonary/Chest: Effort normal and breath sounds normal. No respiratory distress. She has no wheezes. She has no rales.  No crackles  Abdominal: Soft. Bowel sounds are normal. She exhibits no distension, no abdominal bruit and no mass. There is no tenderness.  Musculoskeletal: She exhibits tenderness. She exhibits no edema.  Tender over proximal first MTP today  Small amt of swelling in dorsal foot bilat where shoe edge was Nl perf/sens  Lymphadenopathy:    She has no cervical adenopathy.  Neurological: She is alert. She has normal reflexes. No cranial nerve deficit. She exhibits normal muscle tone. Coordination normal.  Skin: Skin is warm and dry. No rash noted.  Psychiatric: She has a normal mood and affect.          Assessment & Plan:   Problem List Items Addressed This Visit      Cardiovascular and Mediastinum   Essential hypertension - Primary    bp in fair  control at this time  BP Readings from Last 1 Encounters:  06/24/16 106/62   No changes needed Disc lifstyle change with low sodium diet and exercise  Enc wt loss  Reviewed labs         Endocrine   Diabetes type 2, controlled ([place])     Lab Results  Component Value Date   HGBA1C 6.7 (H) 06/23/2016   This is up due to worse diet and wt gain [facility]  Also adv to cut the carbs/ processed foods and sweets, also reduce portions for wt loss  F/u 6 mo  Continue metformin and glipizide         Other   Foot pain, left    Ongoing - pain is over proximal first MTP Wearing otc orthotics Has seen podiatry  Not improving  Will ref to sport med for further eval       Hyperlipidemia    Disc goals for lipids and reasons to control them Rev labs with pt Rev low sat fat diet in detail Triglycerides are up likely due to inc blood glucose  Plan made for diet change Continue atorvastatin  Re check 6 mo      Obesity    Discussed how this problem influences overall health and the risks it imposes  Reviewed plan for weight loss with lower calorie diet (via better food choices and also portion control or program like weight watchers) and exercise building up to or more than 30 minutes 5 days per week including some aerobic activity   Plan made        Other Visit Diagnoses   None.

## 2016-06-24 NOTE — Patient Instructions (Addendum)
Work up to exercise 30 minutes 5 days per week or more  Start checking some random glucose readings as you change your diet  Try to get away from carbs and sweets / eat more lean protein  Make an appointment with Dr Lorelei Pont for foot pain  Continue current medicines   Follow up in 6 months with lab prior for annual exam

## 2016-06-24 NOTE — Progress Notes (Signed)
Pre visit review using our clinic review tool, if applicable. No additional management support is needed unless otherwise documented below in the visit note. 

## 2016-06-24 NOTE — Assessment & Plan Note (Signed)
Ongoing - pain is over proximal first MTP Wearing otc orthotics Has seen podiatry  Not improving  Will ref to sport med for further eval

## 2016-06-30 ENCOUNTER — Ambulatory Visit: Payer: Medicare Other | Admitting: Family Medicine

## 2016-07-24 ENCOUNTER — Other Ambulatory Visit: Payer: Self-pay | Admitting: Family Medicine

## 2016-08-05 ENCOUNTER — Encounter: Payer: Self-pay | Admitting: Family Medicine

## 2016-08-06 ENCOUNTER — Other Ambulatory Visit: Payer: Self-pay | Admitting: Family Medicine

## 2016-08-10 ENCOUNTER — Encounter: Payer: Self-pay | Admitting: Family Medicine

## 2016-08-10 ENCOUNTER — Ambulatory Visit (INDEPENDENT_AMBULATORY_CARE_PROVIDER_SITE_OTHER): Payer: Medicare Other | Admitting: Family Medicine

## 2016-08-10 VITALS — BP 130/72 | HR 88 | Temp 98.6°F | Ht 64.5 in | Wt 221.5 lb

## 2016-08-10 DIAGNOSIS — J019 Acute sinusitis, unspecified: Secondary | ICD-10-CM | POA: Insufficient documentation

## 2016-08-10 DIAGNOSIS — J0100 Acute maxillary sinusitis, unspecified: Secondary | ICD-10-CM

## 2016-08-10 MED ORDER — FLUTICASONE-SALMETEROL 100-50 MCG/DOSE IN AEPB: INHALATION_SPRAY | RESPIRATORY_TRACT | 11 refills | Status: DC

## 2016-08-10 MED ORDER — HYDROCODONE-HOMATROPINE 5-1.5 MG/5ML PO SYRP: 5.0000 mL | ORAL_SOLUTION | Freq: Four times a day (QID) | ORAL | 0 refills | Status: DC | PRN

## 2016-08-10 MED ORDER — ALBUTEROL SULFATE HFA 108 (90 BASE) MCG/ACT IN AERS: INHALATION_SPRAY | RESPIRATORY_TRACT | 11 refills | Status: DC

## 2016-08-10 MED ORDER — AMOXICILLIN-POT CLAVULANATE 875-125 MG PO TABS: 1.0000 | ORAL_TABLET | Freq: Two times a day (BID) | ORAL | 0 refills | Status: DC

## 2016-08-10 NOTE — Progress Notes (Signed)
Pre visit review using our clinic review tool, if applicable. No additional management support is needed unless otherwise documented below in the visit note. 

## 2016-08-10 NOTE — Assessment & Plan Note (Signed)
Cover with augmentin Disc symptomatic care - see instructions on AVS  Hycodan for cough with caution of sedation  Try nasal saline  Refilled inhalers Update if not starting to improve in a week or if worsening   If worse wheezing-alert , may need prednisone

## 2016-08-10 NOTE — Progress Notes (Signed)
Subjective:    Patient ID: Brooke Mitchell, female    DOB: [birth year], 65 y.o.   MRN: [id number]  HPI Here for cough and congestion   1 week of uri symptoms -not improving  This am more resp symptoms A lot of coughing - prod / ? What color of phlegm  This am tight chest but not wheezing  Hoarse   No nasal d/c Some facial pain  Uses bipap   Scratchy throat Ears ok   No temp   No GI symptoms   Otc; - guifenesin 12 hour  Tylenol prn   Patient Active Problem List   Diagnosis Date Noted  . Acute sinusitis 08/10/2016  . Foot pain, left 12/23/2015  . Urge incontinence 12/23/2015  . Anemia, iron deficiency 02/19/2015  . Vitamin B 12 deficiency 12/05/2014  . Fatigue 11/26/2014  . Grief reaction 01/22/2014  . Colon cancer screening 10/24/2012  . Anemia 10/24/2012  . Trochanteric bursitis of left hip 11/14/2011  . Obesity 10/19/2011  . Other screening mammogram 04/13/2011  . Routine general medical examination at a health care facility 04/07/2011  . UTI'S, RECURRENT 02/03/2010  . POSTMENOPAUSAL STATUS 03/28/2008  . Hypothyroidism 11/24/2006  . Diabetes type 2, controlled ([place]) 11/24/2006  . Hyperlipidemia 11/24/2006  . RESTLESS LEG SYNDROME 11/24/2006  . Essential hypertension 11/24/2006  . PREMATURE VENTRICULAR CONTRACTIONS 11/24/2006  . HEMORRHOIDS 11/24/2006  . ALLERGIC RHINITIS 11/24/2006  . ASTHMA 11/24/2006  . GERD 11/24/2006  . HIATAL HERNIA 11/24/2006  . OVERACTIVE BLADDER 11/24/2006  . [place] DISEASE, CERVICAL 11/24/2006  . Sleep apnea 11/24/2006   Past Medical History:  Diagnosis Date  . Allergic rhinitis   . Allergy   . Anxiety   . Arthritis    osteoarthritis  . Asthma    As a child   . Depression   . Diabetes mellitus    Type II (2/06 elevated microalbumin)  . Frequent UTI    with coital prohylaxis   . GERD (gastroesophageal reflux disease)   . Hyperlipidemia   . Hypertension   . Hypothyroidism   . IDA (iron deficiency anemia)   .  Obesity   . OSA (obstructive sleep apnea)    CPAP  . Sleep apnea    wears c-pap   Past Surgical History:  Procedure Laterality Date  . COLONOSCOPY    . DILATION AND CURETTAGE OF UTERUS    . ENDOMETRIAL BIOPSY    . FOOT SURGERY    . UPPER GASTROINTESTINAL ENDOSCOPY     Social History  Substance Use Topics  . Smoking status: Never Smoker  . Smokeless tobacco: Never Used  . Alcohol use 0.0 oz/week     Comment: occasional   Family History  Problem Relation Age of Onset  . Diabetes Mother   . Coronary artery disease Mother   . Hypothyroidism Mother   . Irritable bowel syndrome Mother   . Alzheimer's disease Father   . Nephrolithiasis Father   . Hypothyroidism Brother   . Colon cancer Neg Hx   . Esophageal cancer Neg Hx   . Rectal cancer Neg Hx   . Stomach cancer Neg Hx    No Known Allergies Current Outpatient Prescriptions on File Prior to Visit  Medication Sig Dispense Refill  . acetaminophen (TYLENOL) 325 MG tablet Take 650 mg by mouth as needed for pain.    Marland Kitchen albuterol (PROVENTIL,VENTOLIN) 90 MCG/ACT inhaler Inhale 2 puffs into the lungs every 4 (four) hours as needed.      Marland Kitchen  Ascorbic Acid (VITAMIN C) 100 MG tablet Take 100 mg by mouth daily.    Marland Kitchen aspirin 81 MG tablet Take 81 mg by mouth daily.      Marland Kitchen atorvastatin (LIPITOR) 20 MG tablet TAKE 1/2 TABLET (10 MG TOTAL) BY MOUTH DAILY. 15 tablet 5  . calcium carbonate (CALCIUM 600) 600 MG TABS Take 600 mg by mouth 2 (two) times daily with a meal.      . Cholecalciferol 4000 UNITS CAPS Take 4,000 Units by mouth daily. Pt takes 2 tablets of 2,000 units per day = 4,000 units a day    . cyanocobalamin (,VITAMIN B-12,) 1000 MCG/ML injection Inject 1,000 mcg into the muscle. Once every 2 months    . Cyanocobalamin (VITAMIN B-12 PO) Take 6 mcg by mouth daily.    . fosinopril-hydrochlorothiazide (MONOPRIL-HCT) 10-12.5 MG tablet TAKE 1 TABLET BY MOUTH DAILY. 90 tablet 1  . glipiZIDE (GLUCOTROL XL) 10 MG 24 hr tablet TAKE 1 TABLET (10  MG TOTAL) BY MOUTH DAILY. 90 tablet 1  . hyoscyamine (LEVSIN SL) 0.125 MG SL tablet Place one tablet under tongue once a day as needed. 30 tablet 1  . IRON, FERROUS GLUCONATE, PO Take 1 tablet by mouth 3 (three) times daily.     Marland Kitchen loratadine (CLARITIN) 10 MG tablet Take 10 mg by mouth daily.      . meloxicam (MOBIC) 15 MG tablet TAKE 1 TABLET BY MOUTH DAILY AS NEEDED WITH FOOD 90 tablet 1  . metFORMIN (GLUCOPHAGE) 1000 MG tablet TAKE 1 TABLET (1,000 MG TOTAL) BY MOUTH 2 (TWO) TIMES DAILY WITH A MEAL. 180 tablet 1  . methocarbamol (ROBAXIN) 500 MG tablet Take 1 tablet (500 mg total) by mouth 3 (three) times daily as needed for muscle spasms. 40 tablet 0  . multivitamin (THERAGRAN) per tablet Take 1 tablet by mouth daily.      . NONFORMULARY OR COMPOUNDED [place] compound:  Antiinflammatory cream - Diclofenac 3%, Baclofen 2%, Cyclobenzaprine 2%, Lidocaine 2%, dispense 120 grams, apply 1-2 grams to affected area 3-4 times daily, +3refills. 120 each 3  . ONE TOUCH ULTRA TEST test strip CHECK SUGAR TWICE A DAY AS INSTRUCTED (DM 250.0) THAT IS NOT OPTIMALLYCONTROLLED 100 each 0  . SYNTHROID 50 MCG tablet TAKE 1 TABLET (50 MCG TOTAL) BY MOUTH DAILY. (Patient taking differently: TAKE 1/2 TABLET (25 MCG TOTAL) BY MOUTH DAILY.) 30 tablet 5   Current Facility-Administered Medications on File Prior to Visit  Medication Dose Route Frequency Provider Last Rate Last Dose  . cyanocobalamin ((VITAMIN B-12)) injection 1,000 mcg  1,000 mcg Intramuscular Q30 days Abner Greenspan, MD   1,000 mcg at 06/14/16 1544    Review of Systems  Constitutional: Positive for appetite change. Negative for fatigue and fever.  HENT: Positive for congestion, ear pain, postnasal drip, rhinorrhea, sinus pressure and sore throat. Negative for nosebleeds.   Eyes: Negative for pain, redness and itching.  Respiratory: Positive for cough and wheezing. Negative for shortness of breath.   Cardiovascular: Negative for chest pain.   Gastrointestinal: Negative for abdominal pain, diarrhea, nausea and vomiting.  Endocrine: Negative for polyuria.  Genitourinary: Negative for dysuria, frequency and urgency.  Musculoskeletal: Negative for arthralgias and myalgias.  Allergic/Immunologic: Negative for immunocompromised state.  Neurological: Positive for headaches. Negative for dizziness, tremors, syncope, weakness and numbness.  Hematological: Negative for adenopathy. Does not bruise/bleed easily.  Psychiatric/Behavioral: Negative for dysphoric mood. The patient is not nervous/anxious.        Objective:  Physical Exam  Constitutional: She appears well-developed and well-nourished. No distress.  obese and well appearing    HENT:  Head: Normocephalic and atraumatic.  Right Ear: External ear normal.  Left Ear: External ear normal.  Mouth/Throat: Oropharynx is clear and moist. No oropharyngeal exudate.  Nares are injected and congested  Bilateral maxillary sinus tenderness -worse on the L side Post nasal drip   Eyes: Conjunctivae and EOM are normal. Pupils are equal, round, and reactive to light. Right eye exhibits no discharge. Left eye exhibits no discharge.  Neck: Normal range of motion. Neck supple.  Cardiovascular: Normal rate and regular rhythm.   Pulmonary/Chest: Effort normal and breath sounds normal. No respiratory distress. She has no wheezes. She has no rales.  Harsh bs occ rhonchi  No wheeze  Lymphadenopathy:    She has no cervical adenopathy.  Neurological: She is alert. No cranial nerve deficit.  Skin: Skin is warm and dry. No rash noted.  Psychiatric: She has a normal mood and affect.          Assessment & Plan:   Problem List Items Addressed This Visit      Respiratory   Acute sinusitis    Cover with augmentin Disc symptomatic care - see instructions on AVS  Hycodan for cough with caution of sedation  Try nasal saline  Refilled inhalers Update if not starting to improve in a week or  if worsening   If worse wheezing-alert , may need prednisone       Relevant Medications   amoxicillin-clavulanate (AUGMENTIN) 875-125 MG tablet   HYDROcodone-homatropine (HYCODAN) 5-1.5 MG/5ML syrup

## 2016-08-10 NOTE — Patient Instructions (Addendum)
Take the augmentin for sinus infection  Drink lots of fluids Try nasal saline spray as often as you want Breathe steam Continue inhalers  Hycodan for cough when not working or driving   Update if not starting to improve in a week or if worsening

## 2016-08-16 ENCOUNTER — Ambulatory Visit: Payer: Medicare Other

## 2016-08-23 ENCOUNTER — Encounter: Payer: Self-pay | Admitting: Emergency Medicine

## 2016-08-23 ENCOUNTER — Emergency Department
Admission: EM | Admit: 2016-08-23 | Discharge: 2016-08-23 | Disposition: A | Discharge status: Home / Self Care | Payer: Medicare Other | Attending: Emergency Medicine | Admitting: Emergency Medicine

## 2016-08-23 DIAGNOSIS — Z7984 Long term (current) use of oral hypoglycemic drugs: Secondary | ICD-10-CM | POA: Insufficient documentation

## 2016-08-23 DIAGNOSIS — Z23 Encounter for immunization: Secondary | ICD-10-CM | POA: Diagnosis not present

## 2016-08-23 DIAGNOSIS — Y999 Unspecified external cause status: Secondary | ICD-10-CM | POA: Insufficient documentation

## 2016-08-23 DIAGNOSIS — Z7982 Long term (current) use of aspirin: Secondary | ICD-10-CM | POA: Diagnosis not present

## 2016-08-23 DIAGNOSIS — Y939 Activity, unspecified: Secondary | ICD-10-CM | POA: Diagnosis not present

## 2016-08-23 DIAGNOSIS — I10 Essential (primary) hypertension: Secondary | ICD-10-CM | POA: Insufficient documentation

## 2016-08-23 DIAGNOSIS — S61512A Laceration without foreign body of left wrist, initial encounter: Secondary | ICD-10-CM | POA: Diagnosis not present

## 2016-08-23 DIAGNOSIS — S6992XA Unspecified injury of left wrist, hand and finger(s), initial encounter: Secondary | ICD-10-CM | POA: Diagnosis present

## 2016-08-23 DIAGNOSIS — S61412A Laceration without foreign body of left hand, initial encounter: Secondary | ICD-10-CM

## 2016-08-23 DIAGNOSIS — J45909 Unspecified asthma, uncomplicated: Secondary | ICD-10-CM | POA: Diagnosis not present

## 2016-08-23 DIAGNOSIS — Z79899 Other long term (current) drug therapy: Secondary | ICD-10-CM | POA: Diagnosis not present

## 2016-08-23 DIAGNOSIS — E119 Type 2 diabetes mellitus without complications: Secondary | ICD-10-CM | POA: Insufficient documentation

## 2016-08-23 DIAGNOSIS — E039 Hypothyroidism, unspecified: Secondary | ICD-10-CM | POA: Insufficient documentation

## 2016-08-23 DIAGNOSIS — Y92410 Unspecified street and highway as the place of occurrence of the external cause: Secondary | ICD-10-CM | POA: Diagnosis not present

## 2016-08-23 DIAGNOSIS — S161XXA Strain of muscle, fascia and tendon at neck level, initial encounter: Secondary | ICD-10-CM | POA: Insufficient documentation

## 2016-08-23 MED ORDER — METHOCARBAMOL 500 MG PO TABS: 500.0000 mg | ORAL_TABLET | Freq: Four times a day (QID) | ORAL | 0 refills | Status: DC

## 2016-08-23 MED ORDER — TETANUS-DIPHTH-ACELL PERTUSSIS 5-2.5-18.5 LF-MCG/0.5 IM SUSP
0.5000 mL | Freq: Once | INTRAMUSCULAR | Status: AC
  Administered 2016-08-23: 0.5 mL via INTRAMUSCULAR
  Filled 2016-08-23: qty 0.5
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
## 2016-08-24 ENCOUNTER — Ambulatory Visit (INDEPENDENT_AMBULATORY_CARE_PROVIDER_SITE_OTHER): Payer: Medicare Other | Admitting: *Deleted

## 2016-08-24 DIAGNOSIS — E538 Deficiency of other specified B group vitamins: Secondary | ICD-10-CM

## 2016-08-25 ENCOUNTER — Telehealth: Payer: Self-pay

## 2016-08-25 MED ORDER — FLUCONAZOLE 150 MG PO TABS: 150.0000 mg | ORAL_TABLET | Freq: Once | ORAL | 0 refills | Status: AC

## 2016-08-25 NOTE — Telephone Encounter (Signed)
Pt notified Rx sent and to update [place] if no improvement

## 2016-08-25 NOTE — Telephone Encounter (Signed)
I sent diflucan to her pharmacy F/u if not helpful

## 2016-08-25 NOTE — Telephone Encounter (Signed)
Pt left v/m; pt was seen 08/10/16; pt had taken an abx and now pt has yeast infection with burning and itching. Pt has tried OTC creams without relief. Pt request Dr Glori Bickers to give med to take for yeast. Pt already has appt on 08/30/16. [employer]. Pt request cb.

## 2016-08-30 ENCOUNTER — Encounter: Payer: Self-pay | Admitting: Family Medicine

## 2016-08-30 ENCOUNTER — Ambulatory Visit (INDEPENDENT_AMBULATORY_CARE_PROVIDER_SITE_OTHER): Payer: Medicare Other | Admitting: Family Medicine

## 2016-08-30 DIAGNOSIS — M542 Cervicalgia: Secondary | ICD-10-CM | POA: Insufficient documentation

## 2016-08-30 DIAGNOSIS — S60519A Abrasion of unspecified hand, initial encounter: Secondary | ICD-10-CM

## 2016-08-30 NOTE — Patient Instructions (Signed)
Keep abrasions on hands clean with soap and water  You can use over the counter antibiotic ointment as needed and cover lightly  If you develop increased redness or swelling let me know  Use heat on neck and shoulders to relax muscles  Use muscle relaxer if needed

## 2016-08-30 NOTE — Progress Notes (Signed)
Pre visit review using our clinic review tool, if applicable. No additional management support is needed unless otherwise documented below in the visit note. 
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
## 2016-09-02 LAB — HM DIABETES EYE EXAM

## 2016-09-22 ENCOUNTER — Ambulatory Visit: Payer: Medicare Other

## 2016-09-22 ENCOUNTER — Ambulatory Visit: Payer: Medicare Other | Admitting: Internal Medicine

## 2016-09-22 ENCOUNTER — Other Ambulatory Visit: Payer: Medicare Other

## 2016-09-23 ENCOUNTER — Inpatient Hospital Stay: Payer: Medicare Other

## 2016-09-23 ENCOUNTER — Inpatient Hospital Stay: Payer: Medicare Other | Admitting: Internal Medicine

## 2016-09-30 ENCOUNTER — Other Ambulatory Visit: Payer: Self-pay | Admitting: Family Medicine

## 2016-10-07 ENCOUNTER — Ambulatory Visit: Payer: Medicare Other

## 2016-10-07 ENCOUNTER — Other Ambulatory Visit: Payer: Medicare Other

## 2016-10-07 ENCOUNTER — Ambulatory Visit: Payer: Medicare Other | Admitting: Internal Medicine

## 2016-10-20 ENCOUNTER — Inpatient Hospital Stay: Payer: Medicare Other | Attending: Oncology | Admitting: Oncology

## 2016-10-20 ENCOUNTER — Inpatient Hospital Stay: Payer: Medicare Other

## 2016-10-20 ENCOUNTER — Other Ambulatory Visit: Payer: Self-pay | Admitting: Family Medicine

## 2016-10-20 VITALS — BP 158/92 | HR 96 | Temp 97.1°F | Resp 18 | Wt 224.8 lb

## 2016-10-20 DIAGNOSIS — I10 Essential (primary) hypertension: Secondary | ICD-10-CM | POA: Diagnosis not present

## 2016-10-20 DIAGNOSIS — G4733 Obstructive sleep apnea (adult) (pediatric): Secondary | ICD-10-CM

## 2016-10-20 DIAGNOSIS — D509 Iron deficiency anemia, unspecified: Secondary | ICD-10-CM

## 2016-10-20 DIAGNOSIS — M1990 Unspecified osteoarthritis, unspecified site: Secondary | ICD-10-CM | POA: Diagnosis not present

## 2016-10-20 DIAGNOSIS — D039 Melanoma in situ, unspecified: Secondary | ICD-10-CM

## 2016-10-20 DIAGNOSIS — E039 Hypothyroidism, unspecified: Secondary | ICD-10-CM | POA: Insufficient documentation

## 2016-10-20 DIAGNOSIS — Z79899 Other long term (current) drug therapy: Secondary | ICD-10-CM

## 2016-10-20 DIAGNOSIS — E119 Type 2 diabetes mellitus without complications: Secondary | ICD-10-CM | POA: Diagnosis not present

## 2016-10-20 DIAGNOSIS — K219 Gastro-esophageal reflux disease without esophagitis: Secondary | ICD-10-CM | POA: Diagnosis not present

## 2016-10-20 DIAGNOSIS — Z7984 Long term (current) use of oral hypoglycemic drugs: Secondary | ICD-10-CM | POA: Insufficient documentation

## 2016-10-20 DIAGNOSIS — E785 Hyperlipidemia, unspecified: Secondary | ICD-10-CM | POA: Insufficient documentation

## 2016-10-20 DIAGNOSIS — Z7982 Long term (current) use of aspirin: Secondary | ICD-10-CM | POA: Diagnosis not present

## 2016-10-20 DIAGNOSIS — Z9989 Dependence on other enabling machines and devices: Secondary | ICD-10-CM | POA: Insufficient documentation

## 2016-10-20 DIAGNOSIS — E538 Deficiency of other specified B group vitamins: Secondary | ICD-10-CM | POA: Diagnosis not present

## 2016-10-20 LAB — CBC WITH DIFFERENTIAL/PLATELET
BASOS PCT: 1 %
Basophils Absolute: 0 10*3/uL (ref 0–0.1)
EOS ABS: 0.1 10*3/uL (ref 0–0.7)
Eosinophils Relative: 2 %
HEMATOCRIT: 40 % (ref 35.0–47.0)
HEMOGLOBIN: 13.9 g/dL (ref 12.0–16.0)
LYMPHS ABS: 1.8 10*3/uL (ref 1.0–3.6)
Lymphocytes Relative: 24 %
MCH: 29.9 pg (ref 26.0–34.0)
MCHC: 34.6 g/dL (ref 32.0–36.0)
MCV: 86.5 fL (ref 80.0–100.0)
MONOS PCT: 6 %
Monocytes Absolute: 0.5 10*3/uL (ref 0.2–0.9)
NEUTROS ABS: 5.3 10*3/uL (ref 1.4–6.5)
NEUTROS PCT: 69 %
Platelets: 268 10*3/uL (ref 150–440)
RBC: 4.63 MIL/uL (ref 3.80–5.20)
RDW: 12.5 % (ref 11.5–14.5)
WBC: 7.7 10*3/uL (ref 3.6–11.0)

## 2016-10-20 LAB — IRON AND TIBC
Iron: 48 ug/dL (ref 28–170)
Saturation Ratios: 17 % (ref 10.4–31.8)
TIBC: 283 ug/dL (ref 250–450)
UIBC: 235 ug/dL

## 2016-10-20 LAB — FERRITIN: Ferritin: 109 ng/mL (ref 11–307)

## 2016-10-20 NOTE — Progress Notes (Signed)
Here for follow up. States she is doing well

## 2016-10-20 NOTE — Progress Notes (Signed)
Hematology/Oncology Consult note [facility]  Telephone:([phone] [phone]  Patient Care Team: Abner Greenspan, MD as PCP - General Kathee Delton, MD as Attending Physician (Pulmonary Disease)   Name of the patient: Brooke Mitchell  FK:[id number]  [birth year]   Date of visit: 10/20/16  Diagnosis- iron deficiency and B12 deficiency anemia  Chief complaint/ Reason for visit- routine follow-up of anemia  Heme/Onc history: # 2014 [atleast] Iron deficiency Anemia [? Malabsorption; 2016- EGD/[place]-neg for bleeding; IV Venofer x2 [id number]; on PO iron TID  # March 2016- B12 def- on B12 injections [neg- intrinsic antibodies]   Interval history- doing well. Denies any complaints    Review of systems- Review of Systems  Constitutional: Negative for chills, fever, malaise/fatigue and weight loss.  HENT: Negative for congestion, ear discharge and nosebleeds.   Eyes: Negative for blurred vision.  Respiratory: Negative for cough, hemoptysis, sputum production, shortness of breath and wheezing.   Cardiovascular: Negative for chest pain, palpitations, orthopnea and claudication.  Gastrointestinal: Negative for abdominal pain, blood in stool, constipation, diarrhea, heartburn, melena, nausea and vomiting.  Genitourinary: Negative for dysuria, flank pain, frequency, hematuria and urgency.  Musculoskeletal: Negative for back pain, joint pain and myalgias.  Skin: Negative for rash.  Neurological: Negative for dizziness, tingling, focal weakness, seizures, weakness and headaches.  Endo/Heme/Allergies: Does not bruise/bleed easily.  Psychiatric/Behavioral: Negative for depression and suicidal ideas. The patient does not have insomnia.      Current treatment- oral iron  No Known Allergies   Past Medical History:  Diagnosis Date  . Allergic rhinitis   . Allergy   . Anxiety   . Arthritis    osteoarthritis  . Asthma    As a child   . Depression   .  Diabetes mellitus    Type II (2/06 elevated microalbumin)  . Frequent UTI    with coital prohylaxis   . GERD (gastroesophageal reflux disease)   . Hyperlipidemia   . Hypertension   . Hypothyroidism   . IDA (iron deficiency anemia)   . Obesity   . OSA (obstructive sleep apnea)    CPAP  . Sleep apnea    wears c-pap     Past Surgical History:  Procedure Laterality Date  . COLONOSCOPY    . DILATION AND CURETTAGE OF UTERUS    . ENDOMETRIAL BIOPSY    . FOOT SURGERY    . UPPER GASTROINTESTINAL ENDOSCOPY      Social History   Social History  . Marital status: Married    Spouse name: N/A  . Number of children: 2  . Years of education: N/A   Occupational History  . 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 Smoking status: Never Smoker  . Smokeless tobacco: Never Used  . Alcohol use 0.0 oz/week     Comment: occasional  . Drug use: No  . Sexual activity: Not on file   Other Topics Concern  . Not on file   Social History Narrative   Some walking for exercise   Widow 11/2013- Cared for husband full time with ALS at home     Family History  Problem Relation Age of Onset  . Diabetes Mother   . Coronary artery disease Mother   . Hypothyroidism Mother   . Irritable bowel syndrome Mother   . Alzheimer's disease Father   . Nephrolithiasis Father   . Hypothyroidism Brother   . Colon cancer Neg  Hx   . Esophageal cancer Neg Hx   . Rectal cancer Neg Hx   . Stomach cancer Neg Hx      Current Outpatient Prescriptions:  .  acetaminophen (TYLENOL) 325 MG tablet, Take 650 mg by mouth as needed for pain., Disp: , Rfl:  .  Ascorbic Acid (VITAMIN C) 100 MG tablet, Take 100 mg by mouth daily., Disp: , Rfl:  .  aspirin 81 MG tablet, Take 81 mg by mouth daily.  , Disp: , Rfl:  .  atorvastatin (LIPITOR) 20 MG tablet, TAKE 1/2 TABLET (10 MG TOTAL) BY MOUTH DAILY., Disp: 45 tablet, Rfl: 0 .  calcium carbonate (CALCIUM 600) 600 MG TABS, Take 600 mg  by mouth 2 (two) times daily with a meal.  , Disp: , Rfl:  .  Cholecalciferol 4000 UNITS CAPS, Take 4,000 Units by mouth daily. Pt takes 2 tablets of 2,000 units per day = 4,000 units a day, Disp: , Rfl:  .  cyanocobalamin (,VITAMIN B-12,) 1000 MCG/ML injection, Inject 1,000 mcg into the muscle. Once every 2 months, Disp: , Rfl:  .  Cyanocobalamin (VITAMIN B-12 PO), Take 6 mcg by mouth daily., Disp: , Rfl:  .  fosinopril-hydrochlorothiazide (MONOPRIL-HCT) 10-12.5 MG tablet, TAKE 1 TABLET BY MOUTH DAILY., Disp: 30 tablet, Rfl: 3 .  glipiZIDE (GLUCOTROL XL) 10 MG 24 hr tablet, TAKE 1 TABLET (10 MG TOTAL) BY MOUTH DAILY., Disp: 90 tablet, Rfl: 1 .  IRON, FERROUS GLUCONATE, PO, Take 1 tablet by mouth 3 (three) times daily. , Disp: , Rfl:  .  loratadine (CLARITIN) 10 MG tablet, Take 10 mg by mouth daily.  , Disp: , Rfl:  .  meloxicam (MOBIC) 15 MG tablet, TAKE 1 TABLET BY MOUTH DAILY AS NEEDED WITH FOOD, Disp: 90 tablet, Rfl: 1 .  metFORMIN (GLUCOPHAGE) 1000 MG tablet, TAKE 1 TABLET (1,000 MG TOTAL) BY MOUTH 2 (TWO) TIMES DAILY WITH A MEAL., Disp: 180 tablet, Rfl: 1 .  multivitamin (THERAGRAN) per tablet, Take 1 tablet by mouth daily.  , Disp: , Rfl:  .  ONE TOUCH ULTRA TEST test strip, CHECK SUGAR TWICE A DAY AS INSTRUCTED (DM 250.0) THAT IS NOT OPTIMALLYCONTROLLED, Disp: 100 each, Rfl: 0 .  SYNTHROID 50 MCG tablet, TAKE 1 TABLET (50 MCG TOTAL) BY MOUTH DAILY. (Patient taking differently: TAKE 1/2 TABLET (25 MCG TOTAL) BY MOUTH DAILY.), Disp: 30 tablet, Rfl: 5 .  albuterol (PROAIR HFA) 108 (90 Base) MCG/ACT inhaler, USE 2 PUFFS UPTO EVERY 4 HOURS AS NEEDED FOR WHEEZING (Patient not taking: Reported on 10/20/2016), Disp: 8.5 g, Rfl: 11 .  albuterol (PROVENTIL,VENTOLIN) 90 MCG/ACT inhaler, Inhale 2 puffs into the lungs every 4 (four) hours as needed.  , Disp: , Rfl:  .  Fluticasone-Salmeterol (ADVAIR DISKUS) 100-50 MCG/DOSE AEPB, USE 1 PUFF TWICE DAILY DURING ALLERGY SEASON (Patient not taking: Reported on  10/20/2016), Disp: 60 each, Rfl: 11 .  HYDROcodone-homatropine (HYCODAN) 5-1.5 MG/5ML syrup, Take 5 mLs by mouth every 6 (six) hours as needed for cough. When not working or driving (Patient not taking: Reported on 10/20/2016), Disp: 120 mL, Rfl: 0 .  hyoscyamine (LEVSIN SL) 0.125 MG SL tablet, Place one tablet under tongue once a day as needed. (Patient not taking: Reported on 10/20/2016), Disp: 30 tablet, Rfl: 1 .  methocarbamol (ROBAXIN) 500 MG tablet, Take 1 tablet (500 mg total) by mouth 3 (three) times daily as needed for muscle spasms. (Patient not taking: Reported on 08/30/2016), Disp: 40 tablet, Rfl: 0 .  NONFORMULARY  OR COMPOUNDED ITEM, [facility] compound:  Antiinflammatory cream - Diclofenac 3%, Baclofen 2%, Cyclobenzaprine 2%, Lidocaine 2%, dispense 120 grams, apply 1-2 grams to affected area 3-4 times daily, +3refills. (Patient not taking: Reported on 10/20/2016), Disp: 120 each, Rfl: 3  Current Facility-Administered Medications:  .  cyanocobalamin ((VITAMIN B-12)) injection 1,000 mcg, 1,000 mcg, Intramuscular, Q30 days, Abner Greenspan, MD, 1,000 mcg at 08/24/16 1519  Physical exam:  Vitals:   10/20/16 1409  BP: (!) 158/92  Pulse: 96  Resp: 18  Temp: 97.1 F (36.2 C)  TempSrc: Tympanic  Weight: 224 lb 12.1 oz (101.9 kg)   Physical Exam  Constitutional: She is oriented to person, place, and time and well-developed, well-nourished, and in no distress.  HENT:  Head: Normocephalic and atraumatic.  Eyes: EOM are normal. Pupils are equal, round, and reactive to light.  Neck: Normal range of motion.  Cardiovascular: Normal rate, regular rhythm and normal heart sounds.   Pulmonary/Chest: Effort normal and breath sounds normal.  Abdominal: Soft. Bowel sounds are normal.  Neurological: She is alert and oriented to person, place, and time.  Skin: Skin is warm and dry.     CMP Latest Ref Rng & Units 06/23/2016  Glucose 70 - 99 mg/dL 111(H)  BUN 6 - 23 mg/dL 19  Creatinine 0.40  - 1.20 mg/dL 0.79  Sodium 135 - 145 mEq/L 141  Potassium 3.5 - 5.1 mEq/L 4.0  Chloride 96 - 112 mEq/L 103  CO2 19 - 32 mEq/L 30  Calcium 8.4 - 10.5 mg/dL 9.6  Total Protein 6.0 - 8.3 g/dL 6.7  Total Bilirubin 0.2 - 1.2 mg/dL 0.6  Alkaline Phos 39 - 117 U/L 52  AST 0 - 37 U/L 19  ALT 0 - 35 U/L 25   CBC Latest Ref Rng & Units 10/20/2016  WBC 3.6 - 11.0 K/uL 7.7  Hemoglobin 12.0 - 16.0 g/dL 13.9  Hematocrit 35.0 - 47.0 % 40.0  Platelets 150 - 440 K/uL 268      Assessment and plan- Patient is a 66 y.o. female who sees [place] for iron and B12 deficiency  H/H today is normal at 13.9/40. I will see her back in 6 months with repeat CBC and iron studies.  She will continue to get B12 injections every other month with Dr. Glori Bickers.    Visit Diagnosis 1. Iron deficiency anemia, unspecified iron deficiency anemia type      Dr. Randa Evens, MD, MPH [place] [facility] Pager- [phone] 10/20/2016 2:12 PM

## 2016-10-27 ENCOUNTER — Ambulatory Visit (INDEPENDENT_AMBULATORY_CARE_PROVIDER_SITE_OTHER): Payer: Medicare Other

## 2016-10-27 DIAGNOSIS — E538 Deficiency of other specified B group vitamins: Secondary | ICD-10-CM

## 2016-10-27 MED ORDER — CYANOCOBALAMIN 1000 MCG/ML IJ SOLN
1000.0000 ug | Freq: Once | INTRAMUSCULAR | Status: DC
Start: 2016-10-27 — End: 2016-10-27

## 2016-10-27 MED ORDER — CYANOCOBALAMIN 1000 MCG/ML IJ SOLN
1000.0000 ug | Freq: Once | INTRAMUSCULAR | Status: AC
  Administered 2016-10-27: 1000 ug via INTRAMUSCULAR

## 2016-12-04 ENCOUNTER — Telehealth: Payer: Self-pay | Admitting: Family Medicine

## 2016-12-04 DIAGNOSIS — E538 Deficiency of other specified B group vitamins: Secondary | ICD-10-CM

## 2016-12-04 DIAGNOSIS — E119 Type 2 diabetes mellitus without complications: Secondary | ICD-10-CM

## 2016-12-04 DIAGNOSIS — Z0000 Encounter for general adult medical examination without abnormal findings: Secondary | ICD-10-CM

## 2016-12-04 NOTE — Telephone Encounter (Signed)
-----   Message from Ellamae Sia sent at 11/28/2016  4:16 PM EDT ----- Regarding: Lab orders for Friday, 4.13.18 Patient is scheduled for CPX labs, please order future labs, Thanks , Karna Christmas

## 2016-12-12 ENCOUNTER — Other Ambulatory Visit: Payer: Self-pay | Admitting: Family Medicine

## 2016-12-12 NOTE — Telephone Encounter (Signed)
Will refill electronically  

## 2016-12-12 NOTE — Telephone Encounter (Signed)
Pt has CPE scheduled 12/23/16, last filled on 03/25/16 #90 tabs with 1 additional refill, please advise

## 2016-12-16 ENCOUNTER — Other Ambulatory Visit: Payer: Medicare Other

## 2016-12-16 ENCOUNTER — Other Ambulatory Visit (INDEPENDENT_AMBULATORY_CARE_PROVIDER_SITE_OTHER): Payer: Medicare Other

## 2016-12-16 DIAGNOSIS — R7989 Other specified abnormal findings of blood chemistry: Secondary | ICD-10-CM

## 2016-12-16 DIAGNOSIS — E538 Deficiency of other specified B group vitamins: Secondary | ICD-10-CM

## 2016-12-16 DIAGNOSIS — E119 Type 2 diabetes mellitus without complications: Secondary | ICD-10-CM | POA: Diagnosis not present

## 2016-12-16 DIAGNOSIS — Z0000 Encounter for general adult medical examination without abnormal findings: Secondary | ICD-10-CM

## 2016-12-16 LAB — CBC WITH DIFFERENTIAL/PLATELET
BASOS ABS: 0 10*3/uL (ref 0.0–0.1)
Basophils Relative: 0.5 % (ref 0.0–3.0)
EOS PCT: 2.3 % (ref 0.0–5.0)
Eosinophils Absolute: 0.2 10*3/uL (ref 0.0–0.7)
HEMATOCRIT: 42.4 % (ref 36.0–46.0)
Hemoglobin: 14.3 g/dL (ref 12.0–15.0)
LYMPHS PCT: 28.6 % (ref 12.0–46.0)
Lymphs Abs: 1.9 10*3/uL (ref 0.7–4.0)
MCHC: 33.7 g/dL (ref 30.0–36.0)
MCV: 88.6 fl (ref 78.0–100.0)
MONOS PCT: 6 % (ref 3.0–12.0)
Monocytes Absolute: 0.4 10*3/uL (ref 0.1–1.0)
NEUTROS ABS: 4.1 10*3/uL (ref 1.4–7.7)
NEUTROS PCT: 62.6 % (ref 43.0–77.0)
PLATELETS: 275 10*3/uL (ref 150.0–400.0)
RBC: 4.79 Mil/uL (ref 3.87–5.11)
RDW: 12.2 % (ref 11.5–15.5)
WBC: 6.6 10*3/uL (ref 4.0–10.5)

## 2016-12-16 LAB — COMPREHENSIVE METABOLIC PANEL
ALT: 37 U/L — ABNORMAL HIGH (ref 0–35)
AST: 28 U/L (ref 0–37)
Albumin: 4.3 g/dL (ref 3.5–5.2)
Alkaline Phosphatase: 54 U/L (ref 39–117)
BILIRUBIN TOTAL: 0.7 mg/dL (ref 0.2–1.2)
BUN: 16 mg/dL (ref 6–23)
CALCIUM: 9.5 mg/dL (ref 8.4–10.5)
CHLORIDE: 102 meq/L (ref 96–112)
CO2: 30 meq/L (ref 19–32)
Creatinine, Ser: 0.85 mg/dL (ref 0.40–1.20)
GFR: 71.15 mL/min (ref >60.00)
GLUCOSE: 191 mg/dL — AB (ref 70–99)
POTASSIUM: 3.9 meq/L (ref 3.5–5.1)
Sodium: 141 mEq/L (ref 135–145)
Total Protein: 7.1 g/dL (ref 6.0–8.3)

## 2016-12-16 LAB — LIPID PANEL
CHOL/HDL RATIO: 4
CHOLESTEROL: 169 mg/dL (ref 0–200)
HDL: 42.8 mg/dL (ref >39.00)
NonHDL: 126.27
TRIGLYCERIDES: 239 mg/dL — AB (ref 0.0–149.0)
VLDL: 47.8 mg/dL — AB (ref 0.0–40.0)

## 2016-12-16 LAB — LDL CHOLESTEROL, DIRECT: Direct LDL: 97 mg/dL

## 2016-12-16 LAB — HEMOGLOBIN A1C: Hgb A1c MFr Bld: 7.6 % — ABNORMAL HIGH (ref 4.6–6.5)

## 2016-12-16 LAB — TSH: TSH: 3.92 u[IU]/mL (ref 0.35–4.50)

## 2016-12-16 LAB — VITAMIN B12: VITAMIN B 12: 937 pg/mL — AB (ref 211–911)

## 2016-12-23 ENCOUNTER — Encounter: Payer: Self-pay | Admitting: Family Medicine

## 2016-12-23 ENCOUNTER — Ambulatory Visit (INDEPENDENT_AMBULATORY_CARE_PROVIDER_SITE_OTHER): Payer: Medicare Other | Admitting: Family Medicine

## 2016-12-23 VITALS — BP 118/76 | HR 88 | Temp 98.6°F | Ht 64.5 in | Wt 223.8 lb

## 2016-12-23 DIAGNOSIS — Z0000 Encounter for general adult medical examination without abnormal findings: Secondary | ICD-10-CM | POA: Diagnosis not present

## 2016-12-23 DIAGNOSIS — E538 Deficiency of other specified B group vitamins: Secondary | ICD-10-CM | POA: Diagnosis not present

## 2016-12-23 DIAGNOSIS — Z1231 Encounter for screening mammogram for malignant neoplasm of breast: Secondary | ICD-10-CM | POA: Diagnosis not present

## 2016-12-23 DIAGNOSIS — E119 Type 2 diabetes mellitus without complications: Secondary | ICD-10-CM

## 2016-12-23 DIAGNOSIS — E2839 Other primary ovarian failure: Secondary | ICD-10-CM

## 2016-12-23 DIAGNOSIS — I10 Essential (primary) hypertension: Secondary | ICD-10-CM | POA: Diagnosis not present

## 2016-12-23 DIAGNOSIS — E66812 Obesity, class 2: Secondary | ICD-10-CM

## 2016-12-23 DIAGNOSIS — E7800 Pure hypercholesterolemia, unspecified: Secondary | ICD-10-CM | POA: Diagnosis not present

## 2016-12-23 DIAGNOSIS — Z6837 Body mass index (BMI) 37.0-37.9, adult: Secondary | ICD-10-CM

## 2016-12-23 DIAGNOSIS — Z23 Encounter for immunization: Secondary | ICD-10-CM

## 2016-12-23 DIAGNOSIS — E039 Hypothyroidism, unspecified: Secondary | ICD-10-CM

## 2016-12-23 MED ORDER — GLUCOSE BLOOD VI STRP: ORAL_STRIP | 3 refills | Status: DC

## 2016-12-23 MED ORDER — PNEUMOCOCCAL 13-VAL CONJ VACC IM SUSP
0.5000 mL | Freq: Once | INTRAMUSCULAR | Status: AC
  Administered 2016-12-23: 0.5 mL via INTRAMUSCULAR

## 2016-12-23 NOTE — Progress Notes (Signed)
Pre visit review using our clinic review tool, if applicable. No additional management support is needed unless otherwise documented below in the visit note. 

## 2016-12-23 NOTE — Progress Notes (Signed)
Subjective:    Patient ID: Brooke Mitchell, female    DOB: 01-20-1951, 66 y.o.   MRN: [id number]  HPI Here for welcome to medicare visit   Wt Readings from Last 3 Encounters:  12/23/16 223 lb 12 oz (101.5 kg)  10/20/16 224 lb 12.1 oz (101.9 kg)  08/30/16 221 lb 12 oz (100.6 kg)  stable  Taking fair care of herself - but not optimal diet and exercise  bmi 37.8  I have personally reviewed the Medicare Annual Wellness questionnaire and have noted 1. The patient's medical and social history 2. Their use of alcohol, tobacco or illicit drugs 3. Their current medications and supplements 4. The patient's functional ability including ADL's, fall risks, home safety risks and hearing or visual             impairment. 5. Diet and physical activities 6. Evidence for depression or mood disorders  The patients weight, height, BMI have been recorded in the chart and visual acuity is per eye clinic.  I have made referrals, counseling and provided education to the patient based review of the above and I have provided the pt with a written personalized care plan for preventive services. Reviewed and updated provider list, see scanned forms.  See scanned forms.  Routine anticipatory guidance given to patient.  See health maintenance. Colon cancer screening 5/16-10 y recall / will decide then if she wants to do it  Breast cancer screening 1/16- has not had one since then /overdue  Self breast exam-no lumps  Flu vaccine 10/17 Tetanus vaccine 12/17 Pneumovax- due for prevnar  dexa--ordered today -no falls  Pap 6/14 - no abn paps / no problems and does not see gyn/ no new partner  HEP C and HIV screening declined  Zoster vaccine- had it [place] be interested in Shingrix  Vit D level 22.6- low /only taking 2000 iu daily    Advance directive- she has living will and POA Cognitive function addressed- see scanned forms- and if abnormal then additional documentation follows. (once in a while she  forgets a name for a minute-no more than that)  No one is concerned about her memory    Hearing Screening   125Hz  250Hz  500Hz  1000Hz  2000Hz  3000Hz  4000Hz  6000Hz  8000Hz   Right ear:   40 40 40  40    Left ear:   40 40 40  40    Vision Screening Comments: Pt had eye exam on 12/17 with Dr. Ellin Mayhew  EKG today NSR with rate of 88 and no acute changes   PMH and SH reviewed  Meds, vitals, and allergies reviewed.   ROS: See HPI.  Otherwise negative.     bp is stable today  No cp or palpitations or headaches or edema  No side effects to medicines  BP Readings from Last 3 Encounters:  12/23/16 118/76  10/20/16 (!) 158/92  08/30/16 122/78     Hypothyroidism  Pt has no clinical changes No change in energy level/ hair or skin/ edema and no tremor Lab Results  Component Value Date   TSH 3.92 12/16/2016      DM2 Lab Results  Component Value Date   HGBA1C 7.6 (H) 12/16/2016  not good  Diet is not optimal and not enough exercise  This is up from 6.7  Hx of B12 def Lab Results  Component Value Date   VITAMINB12 937 (H) 12/16/2016   Hx of hyperlipidemia Lab Results  Component Value Date   CHOL 169 12/16/2016  CHOL 156 06/23/2016   CHOL 163 12/16/2015   Lab Results  Component Value Date   HDL 42.80 12/16/2016   HDL 43.80 06/23/2016   HDL 44.20 12/16/2015   Lab Results  Component Value Date   LDLCALC 80 12/16/2015   LDLCALC 77 01/20/2014   LDLCALC 98 07/08/2013   Lab Results  Component Value Date   TRIG 239.0 (H) 12/16/2016   TRIG 209.0 (H) 06/23/2016   TRIG 194.0 (H) 12/16/2015   Lab Results  Component Value Date   CHOLHDL 4 12/16/2016   CHOLHDL 4 06/23/2016   CHOLHDL 4 12/16/2015   Lab Results  Component Value Date   LDLDIRECT 97.0 12/16/2016   LDLDIRECT 92.0 06/23/2016   LDLDIRECT 98.0 11/26/2014  atorvastatin and diet   Lab Results  Component Value Date   WBC 6.6 12/16/2016   HGB 14.3 12/16/2016   HCT 42.4 12/16/2016   MCV 88.6 12/16/2016    PLT 275.0 12/16/2016     Review of Systems Review of Systems  Constitutional: Negative for fever, appetite change, fatigue and unexpected weight change. pos for recent lack of motivation lately for health habits  Eyes: Negative for pain and visual disturbance.  Respiratory: Negative for cough and shortness of breath.   Cardiovascular: Negative for cp or palpitations    Gastrointestinal: Negative for nausea, diarrhea and constipation.  Genitourinary: Negative for urgency and frequency.  Skin: Negative for pallor or rash   Neurological: Negative for weakness, light-headedness, numbness and headaches.  Hematological: Negative for adenopathy. Does not bruise/bleed easily.  Psychiatric/Behavioral: Negative for dysphoric mood. The patient is not nervous/anxious.         Objective:   Physical Exam  Constitutional: She appears well-developed and well-nourished. No distress.  obese and well appearing   HENT:  Head: Normocephalic and atraumatic.  Right Ear: External ear normal.  Left Ear: External ear normal.  Mouth/Throat: Oropharynx is clear and moist.  Eyes: Conjunctivae and EOM are normal. Pupils are equal, round, and reactive to light. No scleral icterus.  Neck: Normal range of motion. Neck supple. No JVD present. Carotid bruit is not present. No thyromegaly present.  Cardiovascular: Normal rate, regular rhythm, normal heart sounds and intact distal pulses.  Exam reveals no gallop.   Pulmonary/Chest: Effort normal and breath sounds normal. No respiratory distress. She has no wheezes. She exhibits no tenderness.  Abdominal: Soft. Bowel sounds are normal. She exhibits no distension, no abdominal bruit and no mass. There is no tenderness.  Genitourinary: No breast swelling, tenderness, discharge or bleeding.  Genitourinary Comments: Breast exam: No mass, nodules, thickening, tenderness, bulging, retraction, inflamation, nipple discharge or skin changes noted.  No axillary or clavicular LA.       Musculoskeletal: Normal range of motion. She exhibits no edema or tenderness.  Lymphadenopathy:    She has no cervical adenopathy.  Neurological: She is alert. She has normal reflexes. No cranial nerve deficit. She exhibits normal muscle tone. Coordination normal.  Skin: Skin is warm and dry. No rash noted. No erythema. No pallor.  Lentigines diffusely  Psychiatric: She has a normal mood and affect.          Assessment & Plan:   Problem List Items Addressed This Visit      Cardiovascular and Mediastinum   Essential hypertension    bp in fair control at this time  BP Readings from Last 1 Encounters:  12/23/16 118/76   No changes needed Disc lifstyle change with low sodium diet and exercise  Labs  reviewed  Wt loss enc         Endocrine   Diabetes type 2, controlled ([place])    Lab Results  Component Value Date   HGBA1C 7.6 (H) 12/16/2016   This is up significantly  Pt states due to poor diet and exercise  She declines any med changes-will work harder on lifestyle changes  Disc carb counting  Disc eye and foot care  f/u 3 mo with lab prior  EKG today re assuring       Hypothyroidism    Hypothyroidism  Pt has no clinical changes No change in energy level/ hair or skin/ edema and no tremor Lab Results  Component Value Date   TSH 3.92 12/16/2016             Other   Estrogen deficiency    Ref for first screen dexa Disc need for calcium/ vitamin D/ wt bearing exercise and bone density test every 2 y to monitor Disc safety/ fracture risk in detail        Relevant Orders   DG Bone Density   Hyperlipidemia    Disc goals for lipids and reasons to control them Rev labs with pt Rev low sat fat diet in detail Fairly stable with diet and statin Opt LDL under 70 Trig up due to worsened dm       Obesity    Discussed how this problem influences overall health and the risks it imposes  Reviewed plan for weight loss with lower calorie diet (via better food  choices and also portion control or program like weight watchers) and exercise building up to or more than 30 minutes 5 days per week including some aerobic activity         Screening mammogram, encounter for    Scheduled annual screening mammogram Nl breast exam today  Encouraged monthly self exams        Relevant Orders   MM DIGITAL SCREENING BILATERAL   Vitamin B 12 deficiency    Lab Results  Component Value Date   VITAMINB12 937 (H) 12/16/2016   Will skip her shot this month      Welcome to Medicare preventive visit - Primary    Reviewed health habits including diet and exercise and skin cancer prevention Reviewed appropriate screening tests for age  Also reviewed health mt list, fam hx and immunization status , as well as social and family history   See HPI Labs reviewed Hearing /vision/cog status/adv directives rev Mm and dexa ref done  Re assuring ekg  Need for wt loss discussed  She will look into zoster vaccine and let us know if she wants one  prevnar today      Relevant Orders   EKG 12-Lead (Completed)    Other Visit Diagnoses    Need for vaccination with 13-polyvalent pneumococcal conjugate vaccine       Relevant Medications   pneumococcal 13-valent conjugate vaccine (PREVNAR 13) injection 0.5 mL (Completed)

## 2016-12-23 NOTE — Patient Instructions (Addendum)
Stop at check out for referrals  prevnar vaccine today   There is a new shingles vaccine called Shigrix  If you are interested in a shingles/zoster vaccine - call your insurance to check on coverage,( you should not get it within 1 month of other vaccines) , then call us for a prescription  for it to take to a pharmacy that gives the shot , or make a nurse visit to get it here depending on your coverage  Your vitamin D level is low  Increase you D (in addition to the calcium) - to 4000 iu daily   For diabetic diet - get your carbs from produce  Avoid bread/rice/pasta/crackers and other snack foods  Avoid sweets and sugar drinks 30 min or more of exercise per day  Work on weight loss   Follow up 3 months

## 2016-12-25 NOTE — Assessment & Plan Note (Signed)
Discussed how this problem influences overall health and the risks it imposes  Reviewed plan for weight loss with lower calorie diet (via better food choices and also portion control or program like weight watchers) and exercise building up to or more than 30 minutes 5 days per week including some aerobic activity    

## 2016-12-25 NOTE — Assessment & Plan Note (Signed)
Lab Results  Component Value Date   [id number] 364 (H) 12/16/2016   Will skip her shot this month

## 2016-12-25 NOTE — Assessment & Plan Note (Signed)
Reviewed health habits including diet and exercise and skin cancer prevention Reviewed appropriate screening tests for age  Also reviewed health mt list, fam hx and immunization status , as well as social and family history   See HPI Labs reviewed Hearing /vision/cog status/adv directives rev Mm and dexa ref done  Re assuring ekg  Need for wt loss discussed  She will look into zoster vaccine and let us know if she wants one  prevnar today

## 2016-12-25 NOTE — Assessment & Plan Note (Signed)
Scheduled annual screening mammogram Nl breast exam today  Encouraged monthly self exams   

## 2016-12-25 NOTE — Assessment & Plan Note (Signed)
Hypothyroidism  Pt has no clinical changes No change in energy level/ hair or skin/ edema and no tremor Lab Results  Component Value Date   TSH 3.92 12/16/2016

## 2016-12-25 NOTE — Assessment & Plan Note (Signed)
bp in fair control at this time  BP Readings from Last 1 Encounters:  12/23/16 118/76   No changes needed Disc lifstyle change with low sodium diet and exercise  Labs reviewed  Wt loss enc

## 2016-12-25 NOTE — Assessment & Plan Note (Signed)
Ref for first screen dexa Disc need for calcium/ vitamin D/ wt bearing exercise and bone density test every 2 y to monitor Disc safety/ fracture risk in detail

## 2016-12-25 NOTE — Assessment & Plan Note (Signed)
Lab Results  Component Value Date   HGBA1C 7.6 (H) 12/16/2016   This is up significantly  Pt states due to poor diet and exercise  She declines any med changes-will work harder on lifestyle changes  Disc carb counting  Disc eye and foot care  f/u 3 mo with lab prior  EKG today re assuring

## 2016-12-25 NOTE — Assessment & Plan Note (Signed)
Disc goals for lipids and reasons to control them Rev labs with pt Rev low sat fat diet in detail Fairly stable with diet and statin Opt LDL under 70 Trig up due to worsened dm

## 2016-12-27 ENCOUNTER — Ambulatory Visit: Payer: Medicare Other

## 2016-12-29 ENCOUNTER — Ambulatory Visit: Admission: RE | Admit: 2016-12-29 | Payer: Medicare Other | Source: Ambulatory Visit

## 2016-12-29 ENCOUNTER — Other Ambulatory Visit: Payer: Medicare Other

## 2016-12-29 ENCOUNTER — Other Ambulatory Visit: Payer: Self-pay | Admitting: Family Medicine

## 2016-12-30 ENCOUNTER — Other Ambulatory Visit: Payer: Self-pay | Admitting: Family Medicine

## 2017-01-02 ENCOUNTER — Ambulatory Visit
Admission: RE | Admit: 2017-01-02 | Discharge: 2017-01-02 | Disposition: A | Discharge status: Home / Self Care | Payer: Medicare Other | Source: Ambulatory Visit | Attending: Family Medicine | Admitting: Family Medicine

## 2017-01-02 DIAGNOSIS — E2839 Other primary ovarian failure: Secondary | ICD-10-CM | POA: Diagnosis present

## 2017-01-02 DIAGNOSIS — Z1231 Encounter for screening mammogram for malignant neoplasm of breast: Secondary | ICD-10-CM | POA: Diagnosis present

## 2017-01-19 ENCOUNTER — Other Ambulatory Visit: Payer: Self-pay | Admitting: Family Medicine

## 2017-02-11 ENCOUNTER — Other Ambulatory Visit: Payer: Self-pay | Admitting: Family Medicine

## 2017-02-28 ENCOUNTER — Ambulatory Visit (INDEPENDENT_AMBULATORY_CARE_PROVIDER_SITE_OTHER): Payer: Medicare Other | Admitting: *Deleted

## 2017-02-28 DIAGNOSIS — E538 Deficiency of other specified B group vitamins: Secondary | ICD-10-CM | POA: Diagnosis not present

## 2017-02-28 MED ORDER — CYANOCOBALAMIN 1000 MCG/ML IJ SOLN
1000.0000 ug | Freq: Once | INTRAMUSCULAR | Status: AC
  Administered 2017-02-28: 1000 ug via INTRAMUSCULAR

## 2017-03-12 ENCOUNTER — Other Ambulatory Visit: Payer: Self-pay | Admitting: Family Medicine

## 2017-03-13 NOTE — Telephone Encounter (Signed)
F/u scheduled on 03/27/17, also CPE for next year is scheduled. Last filled on 12/12/16 #90 tabs with 0 refills

## 2017-03-13 NOTE — Telephone Encounter (Signed)
Will refill electronically  

## 2017-03-27 ENCOUNTER — Encounter: Payer: Self-pay | Admitting: Family Medicine

## 2017-03-27 ENCOUNTER — Ambulatory Visit (INDEPENDENT_AMBULATORY_CARE_PROVIDER_SITE_OTHER): Payer: Medicare Other | Admitting: Family Medicine

## 2017-03-27 ENCOUNTER — Other Ambulatory Visit: Payer: Self-pay | Admitting: Family Medicine

## 2017-03-27 VITALS — BP 118/72 | HR 83 | Temp 98.4°F | Ht 64.5 in | Wt 217.5 lb

## 2017-03-27 DIAGNOSIS — D509 Iron deficiency anemia, unspecified: Secondary | ICD-10-CM | POA: Diagnosis not present

## 2017-03-27 DIAGNOSIS — E7800 Pure hypercholesterolemia, unspecified: Secondary | ICD-10-CM

## 2017-03-27 DIAGNOSIS — E119 Type 2 diabetes mellitus without complications: Secondary | ICD-10-CM

## 2017-03-27 DIAGNOSIS — I10 Essential (primary) hypertension: Secondary | ICD-10-CM

## 2017-03-27 DIAGNOSIS — E538 Deficiency of other specified B group vitamins: Secondary | ICD-10-CM | POA: Diagnosis not present

## 2017-03-27 LAB — COMPREHENSIVE METABOLIC PANEL
ALBUMIN: 3.9 g/dL (ref 3.5–5.2)
ALT: 23 U/L (ref 0–35)
AST: 17 U/L (ref 0–37)
Alkaline Phosphatase: 48 U/L (ref 39–117)
BUN: 13 mg/dL (ref 6–23)
CHLORIDE: 103 meq/L (ref 96–112)
CO2: 27 meq/L (ref 19–32)
Calcium: 9.4 mg/dL (ref 8.4–10.5)
Creatinine, Ser: 0.78 mg/dL (ref 0.40–1.20)
GFR: 78.5 mL/min (ref >60.00)
GLUCOSE: 194 mg/dL — AB (ref 70–99)
POTASSIUM: 4 meq/L (ref 3.5–5.1)
SODIUM: 140 meq/L (ref 135–145)
Total Bilirubin: 0.5 mg/dL (ref 0.2–1.2)
Total Protein: 6.5 g/dL (ref 6.0–8.3)

## 2017-03-27 LAB — LIPID PANEL
CHOL/HDL RATIO: 3
CHOLESTEROL: 150 mg/dL (ref 0–200)
HDL: 44.3 mg/dL (ref >39.00)
NONHDL: 105.68
Triglycerides: 219 mg/dL — ABNORMAL HIGH (ref 0.0–149.0)
VLDL: 43.8 mg/dL — ABNORMAL HIGH (ref 0.0–40.0)

## 2017-03-27 LAB — VITAMIN B12: VITAMIN B 12: 1421 pg/mL — AB (ref 211–911)

## 2017-03-27 LAB — HEMOGLOBIN A1C: Hgb A1c MFr Bld: 6.6 % — ABNORMAL HIGH (ref 4.6–6.5)

## 2017-03-27 LAB — LDL CHOLESTEROL, DIRECT: Direct LDL: 84 mg/dL

## 2017-03-27 MED ORDER — LISINOPRIL-HYDROCHLOROTHIAZIDE 10-12.5 MG PO TABS: 1.0000 | ORAL_TABLET | Freq: Every day | ORAL | 3 refills | Status: DC
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
## 2017-03-27 NOTE — Assessment & Plan Note (Signed)
B12 level today  No shot in 2 mo (usually gets every 2 mo)  If level is nl she would like to stop

## 2017-03-27 NOTE — Assessment & Plan Note (Signed)
Expect improvement with diet/exercise and wt loss Commended  utd eye and foot care  A1C and cmet today  F/u will be decided after lab results

## 2017-03-27 NOTE — Patient Instructions (Signed)
Labs today for A1C and lipid and B12  Continue current medicines but change the monopril hct to lisinopril hct (there should not be a difference) but if any issues let us know  Keep up the diet and exercise and weight loss!

## 2017-03-27 NOTE — Assessment & Plan Note (Signed)
Lipid panel today  Hope for imp with better diet  Continues atorvastatin  Ultimate goal for LDL is 70 or below

## 2017-03-27 NOTE — Assessment & Plan Note (Signed)
Sees hematology Doing better/may sign off soon

## 2017-03-27 NOTE — Assessment & Plan Note (Addendum)
bp in fair control at this time  BP Readings from Last 1 Encounters:  03/27/17 118/72   Will change from monopril hct to lisinopril hct based on availability  Disc lifstyle change with low sodium diet and exercise  Labs today

## 2017-04-07 ENCOUNTER — Other Ambulatory Visit: Payer: Self-pay | Admitting: *Deleted

## 2017-04-07 MED ORDER — LISINOPRIL-HYDROCHLOROTHIAZIDE 10-12.5 MG PO TABS: 1.0000 | ORAL_TABLET | Freq: Every day | ORAL | 3 refills | Status: DC

## 2017-04-07 MED ORDER — LEVOTHYROXINE SODIUM 50 MCG PO TABS: ORAL_TABLET | ORAL | 5 refills | Status: DC

## 2017-04-21 ENCOUNTER — Other Ambulatory Visit: Payer: Medicare Other

## 2017-04-21 ENCOUNTER — Ambulatory Visit: Payer: Medicare Other | Admitting: Oncology

## 2017-05-05 ENCOUNTER — Inpatient Hospital Stay (HOSPITAL_BASED_OUTPATIENT_CLINIC_OR_DEPARTMENT_OTHER): Payer: Medicare Other | Admitting: Oncology

## 2017-05-05 ENCOUNTER — Inpatient Hospital Stay: Payer: Medicare Other | Attending: Oncology

## 2017-05-05 VITALS — BP 141/75 | HR 87 | Temp 97.9°F | Resp 18 | Wt 218.3 lb

## 2017-05-05 DIAGNOSIS — E785 Hyperlipidemia, unspecified: Secondary | ICD-10-CM | POA: Diagnosis not present

## 2017-05-05 DIAGNOSIS — I10 Essential (primary) hypertension: Secondary | ICD-10-CM | POA: Diagnosis not present

## 2017-05-05 DIAGNOSIS — Z803 Family history of malignant neoplasm of breast: Secondary | ICD-10-CM

## 2017-05-05 DIAGNOSIS — Z9989 Dependence on other enabling machines and devices: Secondary | ICD-10-CM | POA: Insufficient documentation

## 2017-05-05 DIAGNOSIS — M1990 Unspecified osteoarthritis, unspecified site: Secondary | ICD-10-CM | POA: Insufficient documentation

## 2017-05-05 DIAGNOSIS — D509 Iron deficiency anemia, unspecified: Secondary | ICD-10-CM | POA: Insufficient documentation

## 2017-05-05 DIAGNOSIS — E119 Type 2 diabetes mellitus without complications: Secondary | ICD-10-CM | POA: Diagnosis not present

## 2017-05-05 DIAGNOSIS — G4733 Obstructive sleep apnea (adult) (pediatric): Secondary | ICD-10-CM | POA: Insufficient documentation

## 2017-05-05 DIAGNOSIS — Z7982 Long term (current) use of aspirin: Secondary | ICD-10-CM | POA: Insufficient documentation

## 2017-05-05 DIAGNOSIS — Z7984 Long term (current) use of oral hypoglycemic drugs: Secondary | ICD-10-CM

## 2017-05-05 DIAGNOSIS — E039 Hypothyroidism, unspecified: Secondary | ICD-10-CM | POA: Diagnosis not present

## 2017-05-05 DIAGNOSIS — K219 Gastro-esophageal reflux disease without esophagitis: Secondary | ICD-10-CM | POA: Diagnosis not present

## 2017-05-05 DIAGNOSIS — E538 Deficiency of other specified B group vitamins: Secondary | ICD-10-CM | POA: Diagnosis not present

## 2017-05-05 DIAGNOSIS — Z79899 Other long term (current) drug therapy: Secondary | ICD-10-CM | POA: Diagnosis not present

## 2017-05-05 LAB — FERRITIN: FERRITIN: 86 ng/mL (ref 11–307)

## 2017-05-05 LAB — CBC
HCT: 40.8 % (ref 35.0–47.0)
Hemoglobin: 14 g/dL (ref 12.0–16.0)
MCH: 29.9 pg (ref 26.0–34.0)
MCHC: 34.5 g/dL (ref 32.0–36.0)
MCV: 86.8 fL (ref 80.0–100.0)
Platelets: 238 10*3/uL (ref 150–440)
RBC: 4.7 MIL/uL (ref 3.80–5.20)
RDW: 13 % (ref 11.5–14.5)
WBC: 7.2 10*3/uL (ref 3.6–11.0)

## 2017-05-05 LAB — IRON AND TIBC
IRON: 44 ug/dL (ref 28–170)
Saturation Ratios: 15 % (ref 10.4–31.8)
TIBC: 289 ug/dL (ref 250–450)
UIBC: 245 ug/dL

## 2017-05-05 NOTE — Progress Notes (Signed)
Hematology/Oncology Consult note [facility]  [phone] [phone]  Patient Care Team: Tower, Wynelle Fanny, MD as PCP - General Clance, Armando Reichert, MD as Attending Physician (Pulmonary Disease)   Name of the patient: Brooke Mitchell  [id number]  October 30, 1950   Date of visit: 05/05/17  Diagnosis- iron deficiency and B12 deficiency anemia  Chief complaint/ Reason for visit- routine follow-up of anemia  Heme/Onc history: # 2014 [atleast] Iron deficiency Anemia [? Malabsorption; 2016- EGD/Colo-neg for bleeding; IV Venofer x2 [9528]; on PO iron TID  # March 2016- B12 def- on B12 injections [neg- intrinsic antibodies]  Interval history- she is currently taking 2 iron tab BID. Denies any blood loss in stools. Denies any complaints  ECOG PS- 0 Pain scale- 0  Review of systems- Review of Systems  Constitutional: Negative for chills, fever, malaise/fatigue and weight loss.  HENT: Negative for congestion, ear discharge and nosebleeds.   Eyes: Negative for blurred vision.  Respiratory: Negative for cough, hemoptysis, sputum production, shortness of breath and wheezing.   Cardiovascular: Negative for chest pain, palpitations, orthopnea and claudication.  Gastrointestinal: Negative for abdominal pain, blood in stool, constipation, diarrhea, heartburn, melena, nausea and vomiting.  Genitourinary: Negative for dysuria, flank pain, frequency, hematuria and urgency.  Musculoskeletal: Negative for back pain, joint pain and myalgias.  Skin: Negative for rash.  Neurological: Negative for dizziness, tingling, focal weakness, seizures, weakness and headaches.  Endo/Heme/Allergies: Does not bruise/bleed easily.  Psychiatric/Behavioral: Negative for depression and suicidal ideas. The patient does not have insomnia.       No Known Allergies   Past Medical History:  Diagnosis Date  . Allergic rhinitis   . Allergy   . Anxiety   . Arthritis    osteoarthritis  . Asthma    As a child   . Depression   . Diabetes mellitus    Type II (2/06 elevated microalbumin)  . Frequent UTI    with coital prohylaxis   . GERD (gastroesophageal reflux disease)   . Hyperlipidemia   . Hypertension   . Hypothyroidism   . IDA (iron deficiency anemia)   . Obesity   . OSA (obstructive sleep apnea)    CPAP  . Sleep apnea    wears c-pap     Past Surgical History:  Procedure Laterality Date  . COLONOSCOPY    . DILATION AND CURETTAGE OF UTERUS    . ENDOMETRIAL BIOPSY    . FOOT SURGERY    . UPPER GASTROINTESTINAL ENDOSCOPY      Social History   Social History  . Marital status: Married    Spouse name: N/A  . Number of children: 2  . Years of education: N/A   Occupational History  . 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 Smoking status: Never Smoker  . Smokeless tobacco: Never Used  . Alcohol use 0.0 oz/week     Comment: occasional  . Drug use: No  . Sexual activity: Not on file   Other Topics Concern  . Not on file   Social History Narrative   Some walking for exercise   Widow 11/2013- Cared for husband full time with ALS at home     Family History  Problem Relation Age of Onset  . Diabetes Mother   . Coronary artery disease Mother   . Hypothyroidism Mother   . Irritable bowel syndrome Mother   . Alzheimer's disease Father   . Nephrolithiasis Father   .  Hypothyroidism Brother   . Breast cancer Paternal Grandmother 5  . Colon cancer Neg Hx   . Esophageal cancer Neg Hx   . Rectal cancer Neg Hx   . Stomach cancer Neg Hx      Current Outpatient Prescriptions:  .  acetaminophen (TYLENOL) 325 MG tablet, Take 650 mg by mouth as needed for pain., Disp: , Rfl:  .  albuterol (PROAIR HFA) 108 (90 Base) MCG/ACT inhaler, USE 2 PUFFS UPTO EVERY 4 HOURS AS NEEDED FOR WHEEZING, Disp: 8.5 g, Rfl: 11 .  albuterol (PROVENTIL,VENTOLIN) 90 MCG/ACT inhaler, Inhale 2 puffs into the lungs every 4  (four) hours as needed.  , Disp: , Rfl:  .  Ascorbic Acid (VITAMIN C) 100 MG tablet, Take 100 mg by mouth daily., Disp: , Rfl:  .  aspirin 81 MG tablet, Take 81 mg by mouth daily.  , Disp: , Rfl:  .  atorvastatin (LIPITOR) 20 MG tablet, TAKE ONE-HALF TABLET BY MOUTH DAILY, Disp: 45 tablet, Rfl: 3 .  calcium carbonate (CALCIUM 600) 600 MG TABS, Take 600 mg by mouth 2 (two) times daily with a meal.  , Disp: , Rfl:  .  Cholecalciferol 4000 UNITS CAPS, Take 4,000 Units by mouth daily. Pt takes 2 tablets of 2,000 units per day = 4,000 units a day, Disp: , Rfl:  .  Cyanocobalamin (VITAMIN B-12 PO), Take 6 mcg by mouth daily., Disp: , Rfl:  .  Fluticasone-Salmeterol (ADVAIR DISKUS) 100-50 MCG/DOSE AEPB, USE 1 PUFF TWICE DAILY DURING ALLERGY SEASON, Disp: 60 each, Rfl: 11 .  glipiZIDE (GLUCOTROL XL) 10 MG 24 hr tablet, TAKE ONE TABLET BY MOUTH DAILY, Disp: 90 tablet, Rfl: 3 .  glucose blood (ONE TOUCH ULTRA TEST) test strip, CHECK SUGAR TWICE A DAY AS INSTRUCTED (DM 250.0) THAT IS NOT OPTIMALLYCONTROLLED, Disp: 100 each, Rfl: 3 .  HYDROcodone-homatropine (HYCODAN) 5-1.5 MG/5ML syrup, Take 5 mLs by mouth every 6 (six) hours as needed for cough. When not working or driving, Disp: 222 mL, Rfl: 0 .  hyoscyamine (LEVSIN SL) 0.125 MG SL tablet, Place one tablet under tongue once a day as needed., Disp: 30 tablet, Rfl: 1 .  IRON, FERROUS GLUCONATE, PO, Take 1 tablet by mouth 2 (two) times daily. , Disp: , Rfl:  .  levothyroxine (SYNTHROID) 50 MCG tablet, TAKE 1 TABLET (50 MCG TOTAL) BY MOUTH DAILY., Disp: 30 tablet, Rfl: 5 .  lisinopril-hydrochlorothiazide (PRINZIDE,ZESTORETIC) 10-12.5 MG tablet, Take 1 tablet by mouth daily., Disp: 90 tablet, Rfl: 3 .  loratadine (CLARITIN) 10 MG tablet, Take 10 mg by mouth daily.  , Disp: , Rfl:  .  meloxicam (MOBIC) 15 MG tablet, TAKE 1 TABLET BY MOUTH DAILY AS NEEDED WITH FOOD, Disp: 90 tablet, Rfl: 1 .  metFORMIN (GLUCOPHAGE) 1000 MG tablet, TAKE 1 TABLET (1,000 MG TOTAL) BY  MOUTH 2 (TWO) TIMES DAILY WITH A MEAL., Disp: 180 tablet, Rfl: 3 .  methocarbamol (ROBAXIN) 500 MG tablet, Take 1 tablet (500 mg total) by mouth 3 (three) times daily as needed for muscle spasms., Disp: 40 tablet, Rfl: 0 .  multivitamin (THERAGRAN) per tablet, Take 1 tablet by mouth daily.  , Disp: , Rfl:  .  NONFORMULARY OR COMPOUNDED ITEM, [facility] compound:  Antiinflammatory cream - Diclofenac 3%, Baclofen 2%, Cyclobenzaprine 2%, Lidocaine 2%, dispense 120 grams, apply 1-2 grams to affected area 3-4 times daily, +3refills., Disp: 120 each, Rfl: 3  Physical exam:  Vitals:   05/05/17 1342  BP: (!) 141/75  Pulse: 87  Resp: 18  Temp: 97.9 F (36.6 C)  TempSrc: Tympanic  Weight: 218 lb 4.8 oz (99 kg)   Physical Exam  Constitutional: She is oriented to person, place, and time and well-developed, well-nourished, and in no distress.  HENT:  Head: Normocephalic and atraumatic.  Eyes: Pupils are equal, round, and reactive to light. EOM are normal.  Neck: Normal range of motion.  Cardiovascular: Normal rate, regular rhythm and normal heart sounds.   Pulmonary/Chest: Effort normal and breath sounds normal.  Abdominal: Soft. Bowel sounds are normal.  Neurological: She is alert and oriented to person, place, and time.  Skin: Skin is warm and dry.     CMP Latest Ref Rng & Units 03/27/2017  Glucose 70 - 99 mg/dL 194(H)  BUN 6 - 23 mg/dL 13  Creatinine 0.40 - 1.20 mg/dL 0.78  Sodium 135 - 145 mEq/L 140  Potassium 3.5 - 5.1 mEq/L 4.0  Chloride 96 - 112 mEq/L 103  CO2 19 - 32 mEq/L 27  Calcium 8.4 - 10.5 mg/dL 9.4  Total Protein 6.0 - 8.3 g/dL 6.5  Total Bilirubin 0.2 - 1.2 mg/dL 0.5  Alkaline Phos 39 - 117 U/L 48  AST 0 - 37 U/L 17  ALT 0 - 35 U/L 23   CBC Latest Ref Rng & Units 05/05/2017  WBC 3.6 - 11.0 K/uL 7.2  Hemoglobin 12.0 - 16.0 g/dL 14.0  Hematocrit 35.0 - 47.0 % 40.8  Platelets 150 - 440 K/uL 238      Assessment and plan- Patient is a 66 y.o. female who sees [place]  for iron and B12 deficiency  irond eficiency anemia- H/H stable over 2 years. I have asked her to cut down PO iron to 1 tab BID. She can get cbc and ironstudies checked with pcp 3 months from now. If she is no longer iron deficient, she can eventually come off PO iron. If she has iron deficiency upon stopping PO iron- etiology would be either obscure GI bleeding which can be evaluated by capsule endoscopy or inadequate PO intake. Since her iron studies have been normal with PO iron, she does not have malabsorption  B12 deficiency- continue B12 shots with pcp  No need for hematology follow up at this time. She can be referred to [place] in the future if she needs IV iron   Visit Diagnosis 1. Iron deficiency anemia, unspecified iron deficiency anemia type   2. Vitamin B 12 deficiency      Dr. Randa Evens, MD, MPH [facility] Pager- [phone] 05/05/2017 2:04 PM

## 2017-05-05 NOTE — Progress Notes (Signed)
Here for follow up

## 2017-06-14 ENCOUNTER — Ambulatory Visit (INDEPENDENT_AMBULATORY_CARE_PROVIDER_SITE_OTHER): Payer: Medicare Other

## 2017-06-14 DIAGNOSIS — Z23 Encounter for immunization: Secondary | ICD-10-CM | POA: Diagnosis not present

## 2017-09-17 ENCOUNTER — Other Ambulatory Visit: Payer: Self-pay | Admitting: Family Medicine

## 2017-09-18 NOTE — Telephone Encounter (Signed)
CPE scheduled on 01/05/18, last filled on 03/13/17 #90 tabs with 1 additional refills, please advise

## 2017-09-26 LAB — HM DIABETES EYE EXAM

## 2017-10-17 ENCOUNTER — Encounter: Payer: Self-pay | Admitting: Family Medicine

## 2017-11-21 ENCOUNTER — Ambulatory Visit: Payer: Medicare Other | Admitting: Family Medicine

## 2017-11-21 ENCOUNTER — Encounter: Payer: Self-pay | Admitting: Family Medicine

## 2017-11-21 VITALS — BP 130/76 | HR 90 | Temp 98.4°F | Ht 64.5 in | Wt 220.5 lb

## 2017-11-21 DIAGNOSIS — E119 Type 2 diabetes mellitus without complications: Secondary | ICD-10-CM | POA: Diagnosis not present

## 2017-11-21 DIAGNOSIS — E7800 Pure hypercholesterolemia, unspecified: Secondary | ICD-10-CM | POA: Diagnosis not present

## 2017-11-21 DIAGNOSIS — E538 Deficiency of other specified B group vitamins: Secondary | ICD-10-CM | POA: Diagnosis not present

## 2017-11-21 DIAGNOSIS — Z6837 Body mass index (BMI) 37.0-37.9, adult: Secondary | ICD-10-CM | POA: Diagnosis not present

## 2017-11-21 DIAGNOSIS — E039 Hypothyroidism, unspecified: Secondary | ICD-10-CM

## 2017-11-21 DIAGNOSIS — I10 Essential (primary) hypertension: Secondary | ICD-10-CM

## 2017-11-21 LAB — CBC WITH DIFFERENTIAL/PLATELET
BASOS ABS: 0 10*3/uL (ref 0.0–0.1)
Basophils Relative: 0.5 % (ref 0.0–3.0)
EOS PCT: 1.8 % (ref 0.0–5.0)
Eosinophils Absolute: 0.1 10*3/uL (ref 0.0–0.7)
HEMATOCRIT: 40.4 % (ref 36.0–46.0)
Hemoglobin: 13.8 g/dL (ref 12.0–15.0)
LYMPHS ABS: 1.8 10*3/uL (ref 0.7–4.0)
LYMPHS PCT: 25.3 % (ref 12.0–46.0)
MCHC: 34.2 g/dL (ref 30.0–36.0)
MCV: 88.3 fl (ref 78.0–100.0)
MONOS PCT: 5.6 % (ref 3.0–12.0)
Monocytes Absolute: 0.4 10*3/uL (ref 0.1–1.0)
NEUTROS ABS: 4.9 10*3/uL (ref 1.4–7.7)
NEUTROS PCT: 66.8 % (ref 43.0–77.0)
PLATELETS: 262 10*3/uL (ref 150.0–400.0)
RBC: 4.58 Mil/uL (ref 3.87–5.11)
RDW: 12.5 % (ref 11.5–15.5)
WBC: 7.3 10*3/uL (ref 4.0–10.5)

## 2017-11-21 LAB — COMPREHENSIVE METABOLIC PANEL
ALK PHOS: 50 U/L (ref 39–117)
ALT: 27 U/L (ref 0–35)
AST: 19 U/L (ref 0–37)
Albumin: 4.2 g/dL (ref 3.5–5.2)
BILIRUBIN TOTAL: 0.5 mg/dL (ref 0.2–1.2)
BUN: 18 mg/dL (ref 6–23)
CALCIUM: 9.6 mg/dL (ref 8.4–10.5)
CO2: 29 mEq/L (ref 19–32)
Chloride: 101 mEq/L (ref 96–112)
Creatinine, Ser: 0.87 mg/dL (ref 0.40–1.20)
GFR: 69.07 mL/min (ref >60.00)
Glucose, Bld: 126 mg/dL — ABNORMAL HIGH (ref 70–99)
POTASSIUM: 3.8 meq/L (ref 3.5–5.1)
Sodium: 139 mEq/L (ref 135–145)
TOTAL PROTEIN: 6.7 g/dL (ref 6.0–8.3)

## 2017-11-21 LAB — LIPID PANEL
Cholesterol: 151 mg/dL (ref 0–200)
HDL: 45.5 mg/dL (ref >39.00)
NonHDL: 105.61
TRIGLYCERIDES: 231 mg/dL — AB (ref 0.0–149.0)
Total CHOL/HDL Ratio: 3
VLDL: 46.2 mg/dL — AB (ref 0.0–40.0)

## 2017-11-21 LAB — TSH: TSH: 3.03 u[IU]/mL (ref 0.35–4.50)

## 2017-11-21 LAB — HEMOGLOBIN A1C: Hgb A1c MFr Bld: 7.1 % — ABNORMAL HIGH (ref 4.6–6.5)

## 2017-11-21 LAB — VITAMIN B12: Vitamin B-12: >1500 pg/mL — ABNORMAL HIGH (ref 211–911)

## 2017-11-21 LAB — LDL CHOLESTEROL, DIRECT: LDL DIRECT: 97 mg/dL

## 2017-11-21 NOTE — Assessment & Plan Note (Addendum)
Level today with labs  Oral supplemented

## 2017-11-21 NOTE — Assessment & Plan Note (Signed)
Discussed how this problem influences overall health and the risks it imposes  Reviewed plan for weight loss with lower calorie diet (via better food choices and also portion control or program like weight watchers) and exercise building up to or more than 30 minutes 5 days per week including some aerobic activity    
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
## 2017-11-21 NOTE — Assessment & Plan Note (Signed)
TSH today 

## 2017-11-21 NOTE — Patient Instructions (Addendum)
Try stopping sweets/added sugar for at least 2 weeks - see if you crave it less    Keep exercising - aim for 5 days per weeks  Labs today

## 2017-11-21 NOTE — Assessment & Plan Note (Signed)
A1C today  Expect up due to indiscriminate eating /less exercise  Pt plans to get back on track  Disc eye and foot care

## 2017-11-21 NOTE — Assessment & Plan Note (Signed)
Lab today  Statin and diet  Trig may be up due to poor eating

## 2017-11-21 NOTE — Assessment & Plan Note (Signed)
bp in fair control at this time  BP Readings from Last 1 Encounters:  11/21/17 130/76   No changes needed Disc lifstyle change with low sodium diet and exercise  Lab today

## 2017-12-29 ENCOUNTER — Ambulatory Visit (INDEPENDENT_AMBULATORY_CARE_PROVIDER_SITE_OTHER): Payer: Medicare Other

## 2017-12-29 VITALS — BP 126/84 | HR 93 | Temp 98.6°F | Ht 64.25 in | Wt 219.8 lb

## 2017-12-29 DIAGNOSIS — Z0000 Encounter for general adult medical examination without abnormal findings: Secondary | ICD-10-CM

## 2017-12-29 DIAGNOSIS — Z1159 Encounter for screening for other viral diseases: Secondary | ICD-10-CM | POA: Diagnosis not present

## 2017-12-29 DIAGNOSIS — Z23 Encounter for immunization: Secondary | ICD-10-CM | POA: Diagnosis not present

## 2017-12-29 NOTE — Progress Notes (Signed)
Subjective:   Brooke Mitchell is a 67 y.o. female who presents for Medicare Annual (Subsequent) preventive examination.  Review of Systems:  N/A Cardiac Risk Factors include: advanced age (>41men, >68 women);diabetes mellitus;obesity (BMI >30kg/m2);dyslipidemia;hypertension     Objective:     Vitals: BP 126/84 (BP Location: Right Arm, Patient Position: Sitting, Cuff Size: Normal)   Pulse 93   Temp 98.6 F (37 C) (Oral)   Ht 5' 4.25" (1.632 m) Comment: no shoes  Wt 219 lb 12 oz (99.7 kg)   SpO2 95%   BMI 37.43 kg/m   Body mass index is 37.43 kg/m.  Advanced Directives 12/29/2017 05/05/2017 10/20/2016 08/23/2016 03/24/2016 09/25/2015 06/03/2015  Does Patient Have a Medical Advance Directive? Yes Yes No;Yes No Yes No Yes  Type of Advance Directive Living will;Healthcare Power of Attorney Living will Living will;Healthcare Power of [place];Living will  Does patient want to make changes to medical advance directive? - - - - - - No - Patient declined  Copy of [place] in Chart? No - copy requested - No - copy requested - - - No - copy requested  Would patient like information on creating a medical advance directive? - - - No - Patient declined - Yes - Educational materials given -    Tobacco Social History   Tobacco Use  Smoking Status Never Smoker  Smokeless Tobacco Never Used     Counseling given: No   Clinical Intake:  Pre-visit preparation completed: Yes  Pain : No/denies pain Pain Score: 0-No pain     Nutritional Status: BMI > 30  Obese Nutritional Risks: None Diabetes: Yes CBG done?: No Did pt. bring in CBG monitor from home?: No  How often do you need to have someone help you when you read instructions, pamphlets, or other written materials from your doctor or pharmacy?: 1 - Never What is the last grade level you completed in school?: Masters degree  Interpreter Needed?: No  Comments: pt is a widow and  lives alone Information entered by :: LPinson, LPN  Past Medical History:  Diagnosis Date  . Allergic rhinitis   . Allergy   . Anxiety   . Arthritis    osteoarthritis  . Asthma    As a child   . Depression   . Diabetes mellitus    Type II (2/06 elevated microalbumin)  . Frequent UTI    with coital prohylaxis   . GERD (gastroesophageal reflux disease)   . Hyperlipidemia   . Hypertension   . Hypothyroidism   . IDA (iron deficiency anemia)   . Obesity   . OSA (obstructive sleep apnea)    CPAP  . Sleep apnea    wears c-pap   Past Surgical History:  Procedure Laterality Date  . BELPHAROPTOSIS REPAIR    . COLONOSCOPY    . DILATION AND CURETTAGE OF UTERUS    . ENDOMETRIAL BIOPSY    . FOOT SURGERY    . UPPER GASTROINTESTINAL ENDOSCOPY     Family History  Problem Relation Age of Onset  . Diabetes Mother   . Coronary artery disease Mother   . Hypothyroidism Mother   . Irritable bowel syndrome Mother   . Alzheimer's disease Father   . Nephrolithiasis Father   . Hypothyroidism Brother   . Breast cancer Paternal Grandmother 53  . Colon cancer Neg Hx   . Esophageal cancer Neg Hx   . Rectal cancer Neg Hx   .  Stomach cancer Neg Hx    Social History   Socioeconomic History  . Marital status: Married    Spouse name: Not on file  . Number of children: 2  . Years of education: Not on file  . Highest education level: Not on file  Occupational History  . Occupation: Product manager: [employer]    Employer: [employer]  . Financial resource strain: Not on file  . Food insecurity:    Worry: Not on file    Inability: Not on file  . Transportation needs:    Medical: Not on file    Non-medical: Not on file  Tobacco Use  . Smoking status: Never Smoker  . Smokeless tobacco: Never Used  Substance and Sexual Activity  . Alcohol use: Yes    Alcohol/week: 0.0 oz    Comment: occasional  . Drug use: No  . Sexual activity: Not on file  Lifestyle    . Physical activity:    Days per week: Not on file    Minutes per session: Not on file  . Stress: Not on file  Relationships  . Social connections:    Talks on phone: Not on file    Gets together: Not on file    Attends religious service: Not on file    Active member of club or organization: Not on file    Attends meetings of clubs or organizations: Not on file    Relationship status: Not on file  Other Topics Concern  . Not on file  Social History Narrative   Some walking for exercise   Widow 11/2013- Cared for husband full time with ALS at home     Outpatient Encounter Medications as of 12/29/2017  Medication Sig  . acetaminophen (TYLENOL) 325 MG tablet Take 650 mg by mouth as needed for pain.  Marland Kitchen albuterol (PROAIR HFA) 108 (90 Base) MCG/ACT inhaler USE 2 PUFFS UPTO EVERY 4 HOURS AS NEEDED FOR WHEEZING  . Ascorbic Acid (VITAMIN C) 100 MG tablet Take 100 mg by mouth daily.  Marland Kitchen aspirin 81 MG tablet Take 81 mg by mouth daily.    Marland Kitchen atorvastatin (LIPITOR) 20 MG tablet TAKE ONE-HALF TABLET BY MOUTH DAILY  . calcium carbonate (CALCIUM 600) 600 MG TABS Take 600 mg by mouth 2 (two) times daily with a meal.    . Cholecalciferol 4000 UNITS CAPS Take 4,000 Units by mouth daily. Pt takes 2 tablets of 2,000 units per day = 4,000 units a day  . Cyanocobalamin (VITAMIN B-12 PO) Take 6 mcg by mouth daily.  . Fluticasone-Salmeterol (ADVAIR DISKUS) 100-50 MCG/DOSE AEPB USE 1 PUFF TWICE DAILY DURING ALLERGY SEASON  . glipiZIDE (GLUCOTROL XL) 10 MG 24 hr tablet TAKE ONE TABLET BY MOUTH DAILY  . glucose blood (ONE TOUCH ULTRA TEST) test strip CHECK SUGAR TWICE A DAY AS INSTRUCTED (DM 250.0) THAT IS NOT OPTIMALLYCONTROLLED  . hyoscyamine (LEVSIN SL) 0.125 MG SL tablet Place one tablet under tongue once a day as needed.  . IRON, FERROUS GLUCONATE, PO Take 1 tablet by mouth 2 (two) times daily.   Marland Kitchen levothyroxine (SYNTHROID) 50 MCG tablet TAKE 1 TABLET (50 MCG TOTAL) BY MOUTH DAILY.  Marland Kitchen  lisinopril-hydrochlorothiazide (PRINZIDE,ZESTORETIC) 10-12.5 MG tablet Take 1 tablet by mouth daily.  Marland Kitchen loratadine (CLARITIN) 10 MG tablet Take 10 mg by mouth daily.    . meloxicam (MOBIC) 15 MG tablet TAKE 1 TABLET BY MOUTH DAILY AS NEEDED WITH FOOD  . metFORMIN (GLUCOPHAGE) 1000  MG tablet TAKE 1 TABLET (1,000 MG TOTAL) BY MOUTH 2 (TWO) TIMES DAILY WITH A MEAL.  . multivitamin (THERAGRAN) per tablet Take 1 tablet by mouth daily.     No facility-administered encounter medications on file as of 12/29/2017.     Activities of Daily Living In your present state of health, do you have any difficulty performing the following activities: 12/29/2017  Hearing? N  Vision? N  Difficulty concentrating or making decisions? N  Walking or climbing stairs? N  Dressing or bathing? N  Doing errands, shopping? N  Preparing Food and eating ? N  Using the Toilet? N  In the past six months, have you accidently leaked urine? N  Do you have problems with loss of bowel control? N  Managing your Medications? N  Managing your Finances? N  Housekeeping or managing your Housekeeping? N  Some recent data might be hidden    Patient Care Team: Tower, Wynelle Fanny, MD as PCP - General Clance, Armando Reichert, MD as Attending Physician (Pulmonary Disease)    Assessment:   This is a routine wellness examination for [place].   Hearing Screening   125Hz  250Hz  500Hz  1000Hz  2000Hz  3000Hz  4000Hz  6000Hz  8000Hz   Right ear:   40 40 40  40    Left ear:   40 40 40  40    Vision Screening Comments: Last vision exam in Jul 18 with Dr. Ellin Mayhew   Exercise Activities and Dietary recommendations Current Exercise Habits: Home exercise routine, Type of exercise: walking, Time (Minutes): 30, Frequency (Times/Week): 4, Weekly Exercise (Minutes/Week): 120, Intensity: Mild, Exercise limited by: None identified  Goals    . Patient Stated     Starting 12/29/2017,  I will continue to walk at least 30 minutes 3-4 days per week.        Fall  Risk Fall Risk  12/29/2017 12/23/2016 12/22/2014  Falls in the past year? Yes No No  Comment tripped while shopping; tripped on sidewalk - -  Number falls in past yr: 2 or more - -  Injury with Fall? No - -   Depression Screen PHQ 2/9 Scores 12/29/2017 12/23/2016  PHQ - 2 Score 0 0  PHQ- 9 Score 0 -     Cognitive Function MMSE - Mini Mental State Exam 12/29/2017  Orientation to time 5  Orientation to Place 5  Registration 3  Attention/ Calculation 0  Recall 2  Recall-comments unable to recall 1 of 3 words  Language- name 2 objects 0  Language- repeat 1  Language- follow 3 step command 3  Language- read & follow direction 0  Write a sentence 0  Copy design 0  Total score 19     PLEASE NOTE: A Mini-Cog screen was completed. Maximum score is 20. A value of 0 denotes this part of [place] MMSE was not completed or the patient failed this part of the Mini-Cog screening.   Mini-Cog Screening Orientation to Time - Max 5 pts Orientation to Place - Max 5 pts Registration - Max 3 pts Recall - Max 3 pts Language Repeat - Max 1 pts Language Follow 3 Step Command - Max 3 pts    Immunization History  Administered Date(s) Administered  . H1N1 09/22/2008  . Influenza Split 05/31/2012  . Influenza Whole 06/05/2006, 06/29/2007, 07/09/2009, 08/04/2010  . Influenza,inj,Quad PF,6+ Mos 06/19/2013, 06/25/2014, 06/16/2015, 06/14/2016, 06/14/2017  . Pneumococcal Conjugate-13 12/23/2016  . Pneumococcal Polysaccharide-23 04/03/2009, 12/29/2017  . Td 01/04/2004, 03/31/2010  . Tdap 08/23/2016  . Zoster 04/29/2011  .  Zoster Recombinat (Shingrix) 07/20/2017    Screening Tests Health Maintenance  Topic Date Due  . INFLUENZA VACCINE  04/05/2018  . HEMOGLOBIN A1C  05/24/2018  . OPHTHALMOLOGY EXAM  09/26/2018  . FOOT EXAM  11/22/2018  . MAMMOGRAM  01/03/2019  . COLONOSCOPY  01/18/2025  . TETANUS/TDAP  08/23/2026  . DEXA SCAN  Completed  . Hepatitis C Screening  Completed  . PNA vac Low Risk  Adult  Completed         Plan:     I have personally reviewed, addressed, and noted the following in the patient's chart:  A. Medical and social history B. Use of alcohol, tobacco or illicit drugs  C. Current medications and supplements D. Functional ability and status E.  Nutritional status F.  Physical activity G. Advance directives H. List of other physicians I.  Hospitalizations, surgeries, and ER visits in previous 12 months J.  [address] to include hearing, vision, cognitive, depression L. Referrals and appointments - none  In addition, I have reviewed and discussed with patient certain preventive protocols, quality metrics, and best practice recommendations. A written personalized care plan for preventive services as well as general preventive health recommendations were provided to patient.  See attached scanned questionnaire for additional information.   Signed,   Lindell Noe, MHA, BS, LPN Health Coach

## 2017-12-29 NOTE — Progress Notes (Signed)
PCP notes:   Health maintenance:  PPSV23 - administered Hep C screening - completed  Abnormal screenings:   Mini-Cog score: 19/20 MMSE - Mini Mental State Exam 12/29/2017  Orientation to time 5  Orientation to Place 5  Registration 3  Attention/ Calculation 0  Recall 2  Recall-comments unable to recall 1 of 3 words  Language- name 2 objects 0  Language- repeat 1  Language- follow 3 step command 3  Language- read & follow direction 0  Write a sentence 0  Copy design 0  Total score 19    Fall risk - hx of multiple falls Fall Risk  12/29/2017 12/23/2016 12/22/2014  Falls in the past year? Yes No No  Comment tripped while shopping; tripped on sidewalk - -  Number falls in past yr: 2 or more - -  Injury with Fall? No - -    Patient concerns:   None  Nurse concerns:  None  Next PCP appt:   01/05/18 @ 2023  I reviewed health advisor's note, was available for consultation, and agree with documentation and plan. Loura Pardon MD

## 2017-12-29 NOTE — Patient Instructions (Addendum)
Brooke Mitchell , Thank you for taking time to come for your Medicare Wellness Visit. I appreciate your ongoing commitment to your health goals. Please review the following plan we discussed and let me know if I can assist you in the future.   These are the goals we discussed: Goals    . Patient Stated     Starting 12/29/2017,  I will continue to walk at least 30 minutes 3-4 days per week.        This is a list of the screening recommended for you and due dates:  Health Maintenance  Topic Date Due  . Flu Shot  04/05/2018  . Hemoglobin A1C  05/24/2018  . Eye exam for diabetics  09/26/2018  . Complete foot exam   11/22/2018  . Mammogram  01/03/2019  . Colon Cancer Screening  01/18/2025  . Tetanus Vaccine  08/23/2026  . DEXA scan (bone density measurement)  Completed  .  Hepatitis C: One time screening is recommended by Center for Disease Control  (CDC) for  adults born from [birth year] through 1965.   Completed  . Pneumonia vaccines  Completed   Preventive Care for Adults  A healthy lifestyle and preventive care can promote health and wellness. Preventive health guidelines for adults include the following key practices.  . A routine yearly physical is a good way to check with your health care provider about your health and preventive screening. It is a chance to share any concerns and updates on your health and to receive a thorough exam.  . Visit your dentist for a routine exam and preventive care every 6 months. Brush your teeth twice a day and floss once a day. Good oral hygiene prevents tooth decay and gum disease.  . The frequency of eye exams is based on your age, health, family medical history, use  of contact lenses, and other factors. Follow your health care provider's recommendations for frequency of eye exams.  . Eat a healthy diet. Foods like vegetables, fruits, whole grains, low-fat dairy products, and lean protein foods contain the nutrients you need without too many calories.  Decrease your intake of foods high in solid fats, added sugars, and salt. Eat the right amount of calories for you. Get information about a proper diet from your health care provider, if necessary.  . Regular physical exercise is one of the most important things you can do for your health. Most adults should get at least 150 minutes of moderate-intensity exercise (any activity that increases your heart rate and causes you to sweat) each week. In addition, most adults need muscle-strengthening exercises on 2 or more days a week.  Silver Sneakers may be a benefit available to you. To determine eligibility, you may visit the website: www.silversneakers.com or contact program at (239)504-5945 Mon-Fri between 8AM-8PM.   . Maintain a healthy weight. The body mass index (BMI) is a screening tool to identify possible weight problems. It provides an estimate of body fat based on height and weight. Your health care provider can find your BMI and can help you achieve or maintain a healthy weight.   For adults 20 years and older: ? A BMI below 18.5 is considered underweight. ? A BMI of 18.5 to 24.9 is normal. ? A BMI of 25 to 29.9 is considered overweight. ? A BMI of 30 and above is considered obese.   . Maintain normal blood lipids and cholesterol levels by exercising and minimizing your intake of saturated fat. Eat a balanced diet  with plenty of fruit and vegetables. Blood tests for lipids and cholesterol should begin at age 65 and be repeated every 5 years. If your lipid or cholesterol levels are high, you are over 50, or you are at high risk for heart disease, you may need your cholesterol levels checked more frequently. Ongoing high lipid and cholesterol levels should be treated with medicines if diet and exercise are not working.  . If you smoke, find out from your health care provider how to quit. If you do not use tobacco, please do not start.  . If you choose to drink alcohol, please do not consume  more than 2 drinks per day. One drink is considered to be 12 ounces (355 mL) of beer, 5 ounces (148 mL) of wine, or 1.5 ounces (44 mL) of liquor.  . If you are 101-35 years old, ask your health care provider if you should take aspirin to prevent strokes.  . Use sunscreen. Apply sunscreen liberally and repeatedly throughout the day. You should seek shade when your shadow is shorter than you. Protect yourself by wearing long sleeves, pants, a wide-brimmed hat, and sunglasses year round, whenever you are outdoors.  . Once a month, do a whole body skin exam, using a mirror to look at the skin on your back. Tell your health care provider of new moles, moles that have irregular borders, moles that are larger than a pencil eraser, or moles that have changed in shape or color.

## 2017-12-31 LAB — HEPATITIS C ANTIBODY
HEP C AB: NONREACTIVE
SIGNAL TO CUT-OFF: 0.01 (ref <1.00)

## 2018-01-05 ENCOUNTER — Ambulatory Visit (INDEPENDENT_AMBULATORY_CARE_PROVIDER_SITE_OTHER): Payer: Medicare Other | Admitting: Family Medicine

## 2018-01-05 ENCOUNTER — Encounter: Payer: Self-pay | Admitting: Family Medicine

## 2018-01-05 VITALS — BP 128/80 | HR 93 | Temp 98.3°F | Ht 64.25 in | Wt 223.2 lb

## 2018-01-05 DIAGNOSIS — Z0000 Encounter for general adult medical examination without abnormal findings: Secondary | ICD-10-CM

## 2018-01-05 DIAGNOSIS — E7800 Pure hypercholesterolemia, unspecified: Secondary | ICD-10-CM

## 2018-01-05 DIAGNOSIS — Z1231 Encounter for screening mammogram for malignant neoplasm of breast: Secondary | ICD-10-CM | POA: Diagnosis not present

## 2018-01-05 DIAGNOSIS — E039 Hypothyroidism, unspecified: Secondary | ICD-10-CM | POA: Diagnosis not present

## 2018-01-05 DIAGNOSIS — E119 Type 2 diabetes mellitus without complications: Secondary | ICD-10-CM | POA: Diagnosis not present

## 2018-01-05 DIAGNOSIS — I10 Essential (primary) hypertension: Secondary | ICD-10-CM | POA: Diagnosis not present

## 2018-01-05 DIAGNOSIS — Z6837 Body mass index (BMI) 37.0-37.9, adult: Secondary | ICD-10-CM

## 2018-01-05 DIAGNOSIS — E538 Deficiency of other specified B group vitamins: Secondary | ICD-10-CM | POA: Diagnosis not present

## 2018-01-05 DIAGNOSIS — D039 Melanoma in situ, unspecified: Secondary | ICD-10-CM

## 2018-01-05 MED ORDER — GLIPIZIDE ER 10 MG PO TB24: 10.0000 mg | ORAL_TABLET | Freq: Every day | ORAL | 3 refills | Status: DC

## 2018-01-05 MED ORDER — FLUTICASONE-SALMETEROL 100-50 MCG/DOSE IN AEPB: INHALATION_SPRAY | RESPIRATORY_TRACT | 11 refills | Status: DC

## 2018-01-05 MED ORDER — LISINOPRIL-HYDROCHLOROTHIAZIDE 10-12.5 MG PO TABS: 1.0000 | ORAL_TABLET | Freq: Every day | ORAL | 3 refills | Status: DC

## 2018-01-05 MED ORDER — LEVOTHYROXINE SODIUM 50 MCG PO TABS: ORAL_TABLET | ORAL | 3 refills | Status: DC

## 2018-01-05 MED ORDER — METFORMIN HCL 1000 MG PO TABS: ORAL_TABLET | ORAL | 3 refills | Status: DC

## 2018-01-05 MED ORDER — ATORVASTATIN CALCIUM 20 MG PO TABS: 10.0000 mg | ORAL_TABLET | Freq: Every day | ORAL | 3 refills | Status: DC

## 2018-01-05 NOTE — Patient Instructions (Addendum)
Let's refer you for a mammogram   B12 is high- cut your dose in 1/2 or take it every other day   Keep working on lifestyle change (diet and exercise)   Let's check A1C after 6/16   Follow up in 6 months with labs prior
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
## 2018-01-07 DIAGNOSIS — Z86006 Personal history of melanoma in-situ: Secondary | ICD-10-CM | POA: Insufficient documentation

## 2018-01-07 DIAGNOSIS — D039 Melanoma in situ, unspecified: Secondary | ICD-10-CM | POA: Insufficient documentation

## 2018-01-07 NOTE — Assessment & Plan Note (Signed)
Discussed how this problem influences overall health and the risks it imposes  Reviewed plan for weight loss with lower calorie diet (via better food choices and also portion control or program like weight watchers) and exercise building up to or more than 30 minutes 5 days per week including some aerobic activity    

## 2018-01-07 NOTE — Assessment & Plan Note (Signed)
Continue derm f/u for h/o this

## 2018-01-07 NOTE — Assessment & Plan Note (Signed)
Disc goals for lipids and reasons to control them Rev labs with pt Rev low sat fat diet in detail Continue atorvastatin  rec less carbs to reduce trig

## 2018-01-07 NOTE — Assessment & Plan Note (Signed)
Hypothyroidism  Pt has no clinical changes No change in energy level/ hair or skin/ edema and no tremor Lab Results  Component Value Date   TSH 3.03 11/21/2017

## 2018-01-07 NOTE — Assessment & Plan Note (Signed)
Scheduled annual screening mammogram Nl breast exam today  Encouraged monthly self exams   

## 2018-01-07 NOTE — Assessment & Plan Note (Signed)
bp in fair control at this time  BP Readings from Last 1 Encounters:  01/05/18 128/80   No changes needed Disc lifstyle change with low sodium diet and exercise  Labs reviewed  Wt loss enc

## 2018-01-07 NOTE — Assessment & Plan Note (Signed)
Reviewed health habits including diet and exercise and skin cancer prevention Reviewed appropriate screening tests for age  Also reviewed health mt list, fam hx and immunization status , as well as social and family history   See HPI Labs reviewed  amw reviewed  Needs 2nd shingrix shot at pharmacy when available  Ref for mammogram  Cut B12 to every other day

## 2018-01-07 NOTE — Assessment & Plan Note (Signed)
Lab Results  Component Value Date   HGBA1C 7.1 (H) 11/21/2017   Will re check mid to late June Better health habits now  Enc wt loss  Foot and eye care discussed

## 2018-01-07 NOTE — Assessment & Plan Note (Signed)
Level is high  Will reduce oral dose to every other day

## 2018-02-06 ENCOUNTER — Ambulatory Visit
Admission: RE | Admit: 2018-02-06 | Discharge: 2018-02-06 | Disposition: A | Discharge status: Home / Self Care | Payer: Medicare Other | Source: Ambulatory Visit | Attending: Family Medicine | Admitting: Family Medicine

## 2018-02-06 DIAGNOSIS — Z1231 Encounter for screening mammogram for malignant neoplasm of breast: Secondary | ICD-10-CM | POA: Diagnosis not present

## 2018-02-18 ENCOUNTER — Telehealth: Payer: Self-pay | Admitting: Family Medicine

## 2018-02-18 DIAGNOSIS — E119 Type 2 diabetes mellitus without complications: Secondary | ICD-10-CM

## 2018-02-18 NOTE — Telephone Encounter (Signed)
-----   Message from Lendon Collar, RT sent at 02/13/2018 10:44 AM EDT ----- Regarding: Lab orders for Tuesday 02/20/18 Please enter lab orders for 02/20/18. The schedule says that she is coming for an A1C. Thanks-Lauren

## 2018-02-19 ENCOUNTER — Encounter: Payer: Self-pay | Admitting: Family Medicine

## 2018-02-20 ENCOUNTER — Other Ambulatory Visit (INDEPENDENT_AMBULATORY_CARE_PROVIDER_SITE_OTHER): Payer: Medicare Other

## 2018-02-20 ENCOUNTER — Encounter: Payer: Self-pay | Admitting: Family Medicine

## 2018-02-20 ENCOUNTER — Ambulatory Visit (INDEPENDENT_AMBULATORY_CARE_PROVIDER_SITE_OTHER): Payer: Medicare Other | Admitting: Family Medicine

## 2018-02-20 VITALS — BP 126/80 | HR 95 | Temp 98.3°F | Ht 64.25 in | Wt 222.5 lb

## 2018-02-20 DIAGNOSIS — E119 Type 2 diabetes mellitus without complications: Secondary | ICD-10-CM

## 2018-02-20 DIAGNOSIS — Z6837 Body mass index (BMI) 37.0-37.9, adult: Secondary | ICD-10-CM

## 2018-02-20 DIAGNOSIS — R300 Dysuria: Secondary | ICD-10-CM

## 2018-02-20 DIAGNOSIS — N3000 Acute cystitis without hematuria: Secondary | ICD-10-CM

## 2018-02-20 DIAGNOSIS — N390 Urinary tract infection, site not specified: Secondary | ICD-10-CM | POA: Insufficient documentation

## 2018-02-20 LAB — POC URINALSYSI DIPSTICK (AUTOMATED)
GLUCOSE UA: NEGATIVE
KETONES UA: 5
Nitrite, UA: POSITIVE
PROTEIN UA: POSITIVE — AB
UROBILINOGEN UA: 2 U/dL — AB
pH, UA: 5 (ref 5.0–8.0)

## 2018-02-20 LAB — POCT GLYCOSYLATED HEMOGLOBIN (HGB A1C): HEMOGLOBIN A1C: 7.8 % — AB (ref 4.0–5.6)

## 2018-02-20 MED ORDER — SULFAMETHOXAZOLE-TRIMETHOPRIM 800-160 MG PO TABS: 1.0000 | ORAL_TABLET | Freq: Two times a day (BID) | ORAL | 0 refills | Status: DC

## 2018-02-20 NOTE — Assessment & Plan Note (Signed)
Lab Results  Component Value Date   HGBA1C 7.8 (A) 02/20/2018   This is up  Disc imp of exercise and DM diet Considering weight watchers program  Ref to dm teaching  Continue glipizide/metformin  Add tx if needed if not imp in 3 mo
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
## 2018-02-20 NOTE — Assessment & Plan Note (Signed)
ua pos Per symptoms-fairly certain she passed stone last night Reassuring exam cx pend  tx with septra and update  Enc inc fluid intake also

## 2018-02-20 NOTE — Assessment & Plan Note (Signed)
With worsening DM Disc weight watchers program-she plans to look into it  Also DM teaching Discussed how this problem influences overall health and the risks it imposes  Reviewed plan for weight loss with lower calorie diet (via better food choices and also portion control or program like weight watchers) and exercise building up to or more than 30 minutes 5 days per week including some aerobic activity

## 2018-02-20 NOTE — Patient Instructions (Addendum)
Aim for 64 oz of fluid per day -mostly water  Lemon is ok in it as well   Take the septra as directed  We will notify when culture returns   Find exercise that does not hurt knees  Bike/water   Make a weight loss effort  Try to get most of your carbohydrates from produce (with the exception of white potatoes)  Eat less bread/pasta/rice/snack foods/cereals/sweets and other items from the middle of the grocery store (processed carbs)  Consider weight watchers!   We will refer for diabetic teaching
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
## 2018-02-21 ENCOUNTER — Ambulatory Visit: Payer: Medicare Other | Admitting: Family Medicine

## 2018-02-21 ENCOUNTER — Ambulatory Visit (INDEPENDENT_AMBULATORY_CARE_PROVIDER_SITE_OTHER)
Admission: RE | Admit: 2018-02-21 | Discharge: 2018-02-21 | Disposition: A | Discharge status: Home / Self Care | Payer: Medicare Other | Source: Ambulatory Visit | Attending: Family Medicine | Admitting: Family Medicine

## 2018-02-21 ENCOUNTER — Encounter: Payer: Self-pay | Admitting: Family Medicine

## 2018-02-21 VITALS — BP 130/74 | HR 89 | Temp 98.5°F | Ht 64.25 in | Wt 221.5 lb

## 2018-02-21 DIAGNOSIS — M25561 Pain in right knee: Secondary | ICD-10-CM

## 2018-02-21 DIAGNOSIS — M25562 Pain in left knee: Secondary | ICD-10-CM | POA: Diagnosis not present

## 2018-02-21 DIAGNOSIS — G8929 Other chronic pain: Secondary | ICD-10-CM

## 2018-02-21 NOTE — Telephone Encounter (Signed)
Pt scheduled and seen 6/19

## 2018-02-22 ENCOUNTER — Encounter: Payer: Self-pay | Admitting: Family Medicine

## 2018-02-22 LAB — URINE CULTURE
MICRO NUMBER:: 90728414
SPECIMEN QUALITY:: ADEQUATE

## 2018-03-19 ENCOUNTER — Other Ambulatory Visit: Payer: Self-pay | Admitting: Family Medicine

## 2018-04-07 ENCOUNTER — Other Ambulatory Visit: Payer: Self-pay | Admitting: Family Medicine

## 2018-04-10 ENCOUNTER — Encounter: Payer: Medicare Other | Attending: Family Medicine | Admitting: Dietician

## 2018-04-10 ENCOUNTER — Encounter: Payer: Self-pay | Admitting: Dietician

## 2018-04-10 VITALS — Ht 64.5 in | Wt 208.7 lb

## 2018-04-10 DIAGNOSIS — Z713 Dietary counseling and surveillance: Secondary | ICD-10-CM | POA: Diagnosis not present

## 2018-04-10 DIAGNOSIS — E119 Type 2 diabetes mellitus without complications: Secondary | ICD-10-CM | POA: Insufficient documentation

## 2018-04-10 NOTE — Progress Notes (Signed)
Medical Nutrition Therapy: Visit start time: 0900  end time: 1000  Assessment:  Diagnosis: Type 2 diabetes Past medical history: HTN, HLD, sleep apnea Psychosocial issues/ stress concerns: none  Preferred learning method:  Nicki [place] . Hands-on   Current weight: 208.7lbs Height: 5'4.5" Medications, supplements: reconciled list in medical record  Progress and evaluation: Patient reports elevated BGs, with recent HbA1C of 7.8%, which is the highest since her diagnosis 17 years ago. BGs have recently been  in 120s - 180s. She reports a strong family history of diabetes, both Type 1 and Type 2, mother had brittle diabetes and complications. This makes patient especially concerned with controlling BGs. She has been working on weight loss, and has lost almost 20lbs so far with diet changes and increased exercise.     Physical activity: recumbent bike 40-45 minutes, 3-5 times a week  Dietary Intake:  Usual eating pattern includes 3 meals and 2 snacks per day. Dining out frequency: 5-6 meals per week.  Breakfast: peanut butter (powder) smoothie; scrambled eggs with cheese; cheese or peanut butter toast; trying to avoid cereal, waffles.  Snack: none Lunch: chicken salad with 5 crackers; cottage cheese; 1/2 sandwich occasionally with chips Snack: popcorn (35 cal per cup); peanut butter crackers Supper: meat and 2 vegetables; veg plate; eats 1/2 of restaurant meals Snack: same as pm Beverages: 64oz water + 1-2 diet soda (Coke zero), occaisonal decaf tea or coffee no sugar  Nutrition Care Education: Topics covered: diabetes, weight control Basic nutrition: basic food groups, appropriate nutrient balance, appropriate meal and snack schedule, general nutrition guidelines (for diabetes)  Weight control: benefits of weight control, identifying healthy weight, determining reasonable weight goal, importance of low fat and low sugar food choices, allowing for occasional treats, appropriate food portions,  role of physical activity. Advanced nutrition: food label reading Diabetes:  goals for HbA1C, appropriate meal and snack schedule, appropriate carb intake and balance, basic meal planning using plate method and food models, factors that affect BGs including sleep quality and quantity, stress, physical activity, pain; (per patient question) diabetes medication classes and actions  Nutritional Diagnosis:  [place]-2.2 Altered nutrition-related laboratory As related to Type 2 diabetes.  As evidenced by patient with recent HbA1C of 7.8% . [place]-3.3 Overweight/obesity As related to history of excess calories and inactivity.  As evidenced by patient with BMI of 35, working on lifestyle changes for weight loss.  Intervention: Instruction and discussion as noted above.   Commended patient for positive changes made and successful weight loss thus far.    Set goals to continue with current eating pattern, while aiming for further improved consistency of carbohydrate.      Education Materials given:  . General diet guidelines for Diabetes . Plate Planner with food lists . Sample meal pattern/ menus . Goals/ instructions   Learner/ who was taught:  . Patient    Level of understanding: Marland Kitchen Verbalizes/ demonstrates competency   Demonstrated degree of understanding via:   Teach back Learning barriers: . None   Willingness to learn/ readiness for change: . Eager, change in progress  Monitoring and Evaluation:  Dietary intake, exercise, BG control, and body weight      follow up: 05/22/18

## 2018-04-10 NOTE — Patient Instructions (Signed)
   Continue to make healthy food choices and control portions, great job!  Aim for consistent carb intake, 2-3 servings, or 30-45 grams with each meal.   Keep up regular exercise, another great job!

## 2018-05-17 ENCOUNTER — Telehealth: Payer: Self-pay | Admitting: Family Medicine

## 2018-05-17 DIAGNOSIS — E119 Type 2 diabetes mellitus without complications: Secondary | ICD-10-CM

## 2018-05-17 DIAGNOSIS — I10 Essential (primary) hypertension: Secondary | ICD-10-CM

## 2018-05-17 DIAGNOSIS — E7800 Pure hypercholesterolemia, unspecified: Secondary | ICD-10-CM

## 2018-05-17 NOTE — Telephone Encounter (Signed)
-----   Message from Ellamae Sia sent at 05/14/2018 11:18 AM EDT ----- Regarding: Lab orders for Tuesday, 9.17.19 Lab orders for a 3 month follow up appt.

## 2018-05-22 ENCOUNTER — Other Ambulatory Visit (INDEPENDENT_AMBULATORY_CARE_PROVIDER_SITE_OTHER): Payer: Medicare Other

## 2018-05-22 ENCOUNTER — Encounter: Payer: Self-pay | Admitting: Dietician

## 2018-05-22 ENCOUNTER — Encounter: Payer: Medicare Other | Attending: Family Medicine | Admitting: Dietician

## 2018-05-22 VITALS — Ht 64.5 in | Wt 212.6 lb

## 2018-05-22 DIAGNOSIS — Z713 Dietary counseling and surveillance: Secondary | ICD-10-CM | POA: Diagnosis not present

## 2018-05-22 DIAGNOSIS — E7800 Pure hypercholesterolemia, unspecified: Secondary | ICD-10-CM

## 2018-05-22 DIAGNOSIS — I10 Essential (primary) hypertension: Secondary | ICD-10-CM

## 2018-05-22 DIAGNOSIS — E119 Type 2 diabetes mellitus without complications: Secondary | ICD-10-CM | POA: Insufficient documentation

## 2018-05-22 LAB — COMPREHENSIVE METABOLIC PANEL
ALT: 18 U/L (ref 0–35)
AST: 15 U/L (ref 0–37)
Albumin: 3.9 g/dL (ref 3.5–5.2)
Alkaline Phosphatase: 47 U/L (ref 39–117)
BILIRUBIN TOTAL: 0.5 mg/dL (ref 0.2–1.2)
BUN: 19 mg/dL (ref 6–23)
CO2: 25 meq/L (ref 19–32)
CREATININE: 0.82 mg/dL (ref 0.40–1.20)
Calcium: 9.4 mg/dL (ref 8.4–10.5)
Chloride: 106 mEq/L (ref 96–112)
GFR: 73.84 mL/min (ref >60.00)
GLUCOSE: 163 mg/dL — AB (ref 70–99)
POTASSIUM: 4.1 meq/L (ref 3.5–5.1)
Sodium: 142 mEq/L (ref 135–145)
Total Protein: 6.5 g/dL (ref 6.0–8.3)

## 2018-05-22 LAB — LIPID PANEL
Cholesterol: 145 mg/dL (ref 0–200)
HDL: 39.5 mg/dL (ref >39.00)
NONHDL: 105.58
Total CHOL/HDL Ratio: 4
Triglycerides: 214 mg/dL — ABNORMAL HIGH (ref 0.0–149.0)
VLDL: 42.8 mg/dL — AB (ref 0.0–40.0)

## 2018-05-22 LAB — LDL CHOLESTEROL, DIRECT: Direct LDL: 85 mg/dL

## 2018-05-22 LAB — HEMOGLOBIN A1C: HEMOGLOBIN A1C: 6.5 % (ref 4.6–6.5)

## 2018-05-22 NOTE — Patient Instructions (Signed)
   Work on plans to resume regular exercise by obtaining bicycle for home use or planning schedule for gym use.   Begin bringing lunches to work to eat during office hours.   Continue with good healthy food choices!

## 2018-05-22 NOTE — Progress Notes (Signed)
Medical Nutrition Therapy: Visit start time: 0905 end time: 0935  Assessment:  Diagnosis: diabetes, obesity Medical history changes: no changes Psychosocial issues/ stress concerns: some increased stress  Current weight: 212.6lbs  Height: 5'4.5" Medications, supplement changes: no changes per patient  Progress and evaluation: Weight has increased 3.9lbs since 04/10/18. 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 She has had to take on an extra class due to staffing issues and therefore has not been able to keep up regular exercise. She reports doing well with continuing to make healthy food choices. Patient has also experienced an increase in blood sugars since resuming work hours.   Physical activity: sporadic recently, planning to resume and reestablish regular schedule  Dietary Intake:  Usual eating pattern includes 3 meals and 1 snacks per day. Dining out frequency: 5-6 meals per week.  Breakfast: yogurt with fruit, or eggs with cheese, toast with cheese or peanut butter, or smoothie with peanut butter powder. Snack: one Lunch: chicken salad and crackers, 1/2 sandwich occasionally with chips, sometimes just cottage cheese, has been incorporating some vegetables with lunch. Snack: low-cal popcorn or peanut butter crackers Supper: lean meat and vegetables, or vegetable plate, 1/2-portions of restaurant meals Snack: sometimes same as pm choices. Beverages: water, diet soda, occasional decaf tea or coffee no sugar  Nutrition Care Education: Topics covered: weight control, diabetes     Weight control: dealing with weight loss plateaus, effects of stress and decreased activity, possibility of detailed food and activity tracking if needed to help with kcal control and resuming weight loss. Diabetes: appropriate meal and snack schedule-- plans to avoid lunch delays with work schedule, effects of stress and inactivity on  BGs   Nutritional Diagnosis:  [place]-2.2 Altered nutrition-related laboratory As related to Type 2 diabetes.  As evidenced by patient with recent HbA1C of 7.8%. [place]-3.3 Overweight/obesity As related to history of excess calories, recent inactivity and increased stress.  As evidenced by patient with BMI of 35.9 and patient report of dietary and lifestyle changes.  Intervention: Discussion as noted above.   Patient is making concrete plans to readjust schedule for exercise and regular meals to resume weight loss and further improve BGs.    Updated goals with direction from patient.    No additional follow-up scheduled at this time; patient will schedule later if needed.  Education Materials given:  Marland Kitchen Goals/ instructions   Learner/ who was taught:  . Patient   Level of understanding: Marland Kitchen Verbalizes/ demonstrates competency  Demonstrated degree of understanding via:   Teach back Learning barriers: . None   Willingness to learn/ readiness for change: . Eager, change in progress   Monitoring and Evaluation:  Dietary intake, exercise, BG control, and body weight      follow up: prn

## 2018-05-25 ENCOUNTER — Encounter: Payer: Self-pay | Admitting: Family Medicine

## 2018-05-25 ENCOUNTER — Ambulatory Visit: Payer: Medicare Other | Admitting: Family Medicine

## 2018-05-25 VITALS — BP 124/76 | HR 86 | Temp 98.7°F | Ht 64.25 in | Wt 211.5 lb

## 2018-05-25 DIAGNOSIS — I10 Essential (primary) hypertension: Secondary | ICD-10-CM | POA: Diagnosis not present

## 2018-05-25 DIAGNOSIS — Z23 Encounter for immunization: Secondary | ICD-10-CM | POA: Diagnosis not present

## 2018-05-25 DIAGNOSIS — E119 Type 2 diabetes mellitus without complications: Secondary | ICD-10-CM | POA: Diagnosis not present

## 2018-05-25 DIAGNOSIS — E7800 Pure hypercholesterolemia, unspecified: Secondary | ICD-10-CM

## 2018-05-25 NOTE — Assessment & Plan Note (Signed)
Disc goals for lipids and reasons to control them Rev last labs with pt Rev low sat fat diet in detail Fairly stable with atorvastatin and diet  Trig down a bit with better glucose control

## 2018-05-25 NOTE — Patient Instructions (Signed)
Your numbers are improving   Keep up the good work with diet/exercise and weight loss   No change in medicines   Follow up in 6 months

## 2018-05-25 NOTE — Assessment & Plan Note (Signed)
Improved with DM teaching and lifestyle change and wt loss Lab Results  Component Value Date   HGBA1C 6.5 05/22/2018   Trouble shooting today- packing lunch Continue exercise bike Disc foot and eye care  F/u 6 mo

## 2018-05-25 NOTE — Assessment & Plan Note (Signed)
bp in fair control at this time  BP Readings from Last 1 Encounters:  05/25/18 124/76   No changes needed Most recent labs reviewed  Disc lifstyle change with low sodium diet and exercise
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
## 2018-06-13 ENCOUNTER — Encounter: Payer: Self-pay | Admitting: Family Medicine

## 2018-06-20 NOTE — Telephone Encounter (Signed)
Patient dropped off a delivery ticket for an RX for a CPAP machine. Placed in [place] tower.

## 2018-06-20 NOTE — Telephone Encounter (Signed)
See mychart message and order placed in your inbox

## 2018-06-22 NOTE — Telephone Encounter (Signed)
Order faxed.

## 2018-06-25 NOTE — Telephone Encounter (Signed)
Brooke Mitchell is calling from [place] in regards to this order and states the patient has to be seen in the office to discuss this before orders can be sent. CB# [phone] ext [phone].

## 2018-07-03 ENCOUNTER — Encounter

## 2018-07-03 ENCOUNTER — Encounter: Payer: Self-pay | Admitting: Family Medicine

## 2018-07-03 ENCOUNTER — Ambulatory Visit: Payer: Medicare Other | Admitting: Family Medicine

## 2018-07-03 VITALS — BP 132/80 | HR 84 | Temp 98.0°F | Ht 64.25 in | Wt 211.5 lb

## 2018-07-03 DIAGNOSIS — Z6836 Body mass index (BMI) 36.0-36.9, adult: Secondary | ICD-10-CM

## 2018-07-03 DIAGNOSIS — I10 Essential (primary) hypertension: Secondary | ICD-10-CM | POA: Diagnosis not present

## 2018-07-03 DIAGNOSIS — G4733 Obstructive sleep apnea (adult) (pediatric): Secondary | ICD-10-CM

## 2018-07-03 NOTE — Assessment & Plan Note (Signed)
bp in fair control at this time  BP Readings from Last 1 Encounters:  07/03/18 132/80   No changes needed Most recent labs reviewed  Disc lifstyle change with low sodium diet and exercise

## 2018-07-03 NOTE — Assessment & Plan Note (Signed)
Discussed how this problem influences overall health and the risks it imposes  Reviewed plan for weight loss with lower calorie diet (via better food choices and also portion control or program like weight watchers) and exercise building up to or more than 30 minutes 5 days per week including some aerobic activity    
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
## 2018-07-03 NOTE — Assessment & Plan Note (Signed)
Face to face visit for OSA today  Doing well with current cpap at 17 cm pressure Needs px for machine/full face mask and equip to [facility]  No change in tx  Much clinical improvement on cpap  Enc wt loss

## 2018-07-03 NOTE — Patient Instructions (Signed)
Keep exercising and take care of yourself   We will continue your cpap at 17 cm - and I will send px for machine and equipment to [facility] along with this progress note   I 'm glad the CPAP is still greatly helping your symptoms

## 2018-07-12 ENCOUNTER — Other Ambulatory Visit: Payer: Medicare Other

## 2018-07-17 ENCOUNTER — Ambulatory Visit: Payer: Medicare Other | Admitting: Family Medicine

## 2018-09-06 ENCOUNTER — Other Ambulatory Visit: Payer: Self-pay

## 2018-09-06 ENCOUNTER — Encounter: Payer: Self-pay | Admitting: Family Medicine

## 2018-09-06 MED ORDER — ALBUTEROL SULFATE HFA 108 (90 BASE) MCG/ACT IN AERS: INHALATION_SPRAY | RESPIRATORY_TRACT | 5 refills | Status: DC

## 2018-09-06 NOTE — Telephone Encounter (Signed)
Pt called for refill albuterol inhaler;pt starting with cough and will cb for appt if needed. Refilled per protocol to [facility].

## 2018-09-18 ENCOUNTER — Ambulatory Visit: Payer: Medicare Other | Admitting: Family Medicine

## 2018-09-19 ENCOUNTER — Ambulatory Visit: Payer: Medicare Other | Admitting: Family Medicine

## 2018-09-25 ENCOUNTER — Ambulatory Visit: Payer: Medicare Other | Admitting: Family Medicine

## 2018-09-25 ENCOUNTER — Encounter: Payer: Self-pay | Admitting: Family Medicine

## 2018-09-25 VITALS — BP 124/68 | HR 94 | Temp 98.2°F | Ht 64.25 in | Wt 213.0 lb

## 2018-09-25 DIAGNOSIS — G4733 Obstructive sleep apnea (adult) (pediatric): Secondary | ICD-10-CM

## 2018-09-25 DIAGNOSIS — D039 Melanoma in situ, unspecified: Secondary | ICD-10-CM | POA: Diagnosis not present

## 2018-09-25 DIAGNOSIS — E119 Type 2 diabetes mellitus without complications: Secondary | ICD-10-CM | POA: Diagnosis not present

## 2018-09-25 DIAGNOSIS — Z6836 Body mass index (BMI) 36.0-36.9, adult: Secondary | ICD-10-CM

## 2018-09-25 NOTE — Assessment & Plan Note (Signed)
Here for a check in visit Doing very well with current cpap and happy with new machine Sleeping well  Using full face mask  No day time somnolence or excessive fatigue Again stressed imp of wt loss
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
## 2018-09-25 NOTE — Assessment & Plan Note (Signed)
Discussed how this problem influences overall health and the risks it imposes  Reviewed plan for weight loss with lower calorie diet (via better food choices and also portion control or program like weight watchers) and exercise building up to or more than 30 minutes 5 days per week including some aerobic activity   Aware wt loss would help sleep apnea  Disc use of her exercise bike - on sun porch- she plans to use small heater when very cold and get out on it daily

## 2018-09-25 NOTE — Assessment & Plan Note (Signed)
Lab Results  Component Value Date   HGBA1C 6.5 05/22/2018   Enc strongly to use her exercise bike daily

## 2018-09-25 NOTE — Patient Instructions (Signed)
Get your heater out and use your bike daily  You will feel better doing that !  This will help weight loss also   I'm glad the cpap is working well !  No changes needed

## 2018-09-25 NOTE — Assessment & Plan Note (Signed)
She continues derm f/u for surveillance

## 2018-10-08 ENCOUNTER — Other Ambulatory Visit: Payer: Self-pay | Admitting: Family Medicine

## 2018-10-08 NOTE — Telephone Encounter (Signed)
F/u scheduled on 11/26/18, last filled on 04/07/18 #90 tabs with 1 refill, please advise

## 2018-10-16 ENCOUNTER — Telehealth: Payer: Self-pay | Admitting: Family Medicine

## 2018-10-16 NOTE — Telephone Encounter (Signed)
I left a message on patient's voice mail to return my call.  Patient has appointment with Dr.Tower on 01/11/19. Dr. Glori Bickers will be out of the office that day. If patient returns my call, please reschedule patient's cpx to another day.

## 2018-10-17 NOTE — Telephone Encounter (Signed)
I spoke to patient and she rescheduled appointment to 01/16/19.

## 2018-11-23 ENCOUNTER — Other Ambulatory Visit: Payer: Medicare Other

## 2018-11-26 ENCOUNTER — Ambulatory Visit: Payer: Medicare Other | Admitting: Family Medicine

## 2018-12-02 ENCOUNTER — Encounter: Payer: Self-pay | Admitting: Family Medicine

## 2019-01-04 ENCOUNTER — Ambulatory Visit (INDEPENDENT_AMBULATORY_CARE_PROVIDER_SITE_OTHER): Payer: Medicare Other

## 2019-01-04 DIAGNOSIS — Z0000 Encounter for general adult medical examination without abnormal findings: Secondary | ICD-10-CM | POA: Diagnosis not present

## 2019-01-04 NOTE — Patient Instructions (Signed)
Ms. Llorente , Thank you for taking time to come for your Medicare Wellness Visit. I appreciate your ongoing commitment to your health goals. Please review the following plan we discussed and let me know if I can assist you in the future.   These are the goals we discussed: Goals    . Patient Stated     Starting 01/04/19,  I will continue to walk at least 30 minutes 3-4 days per week.        This is a list of the screening recommended for you and due dates:  Health Maintenance  Topic Date Due  . Hemoglobin A1C  01/09/2019*  . Eye exam for diabetics  09/05/2019*  . Flu Shot  04/06/2019  . Complete foot exam   05/26/2019  . Mammogram  02/07/2020  . Colon Cancer Screening  01/18/2025  . Tetanus Vaccine  08/23/2026  . DEXA scan (bone density measurement)  Completed  .  Hepatitis C: One time screening is recommended by Center for Disease Control  (CDC) for  adults born from [birth year] through 1965.   Completed  . Pneumonia vaccines  Completed  *Topic was postponed. The date shown is not the original due date.   Preventive Care for Adults  A healthy lifestyle and preventive care can promote health and wellness. Preventive health guidelines for adults include the following key practices.  . A routine yearly physical is a good way to check with your health care provider about your health and preventive screening. It is a chance to share any concerns and updates on your health and to receive a thorough exam.  . Visit your dentist for a routine exam and preventive care every 6 months. Brush your teeth twice a day and floss once a day. Good oral hygiene prevents tooth decay and gum disease.  . The frequency of eye exams is based on your age, health, family medical history, use  of contact lenses, and other factors. Follow your health care provider's recommendations for frequency of eye exams.  . Eat a healthy diet. Foods like vegetables, fruits, whole grains, low-fat dairy products, and lean  protein foods contain the nutrients you need without too many calories. Decrease your intake of foods high in solid fats, added sugars, and salt. Eat the right amount of calories for you. Get information about a proper diet from your health care provider, if necessary.  . Regular physical exercise is one of the most important things you can do for your health. Most adults should get at least 150 minutes of moderate-intensity exercise (any activity that increases your heart rate and causes you to sweat) each week. In addition, most adults need muscle-strengthening exercises on 2 or more days a week.  Silver Sneakers may be a benefit available to you. To determine eligibility, you may visit the website: www.silversneakers.com or contact program at 662-300-8518 Mon-Fri between 8AM-8PM.   . Maintain a healthy weight. The body mass index (BMI) is a screening tool to identify possible weight problems. It provides an estimate of body fat based on height and weight. Your health care provider can find your BMI and can help you achieve or maintain a healthy weight.   For adults 20 years and older: ? A BMI below 18.5 is considered underweight. ? A BMI of 18.5 to 24.9 is normal. ? A BMI of 25 to 29.9 is considered overweight. ? A BMI of 30 and above is considered obese.   . Maintain normal blood lipids and cholesterol levels  by exercising and minimizing your intake of saturated fat. Eat a balanced diet with plenty of fruit and vegetables. Blood tests for lipids and cholesterol should begin at age 72 and be repeated every 5 years. If your lipid or cholesterol levels are high, you are over 50, or you are at high risk for heart disease, you may need your cholesterol levels checked more frequently. Ongoing high lipid and cholesterol levels should be treated with medicines if diet and exercise are not working.  . If you smoke, find out from your health care provider how to quit. If you do not use tobacco, please  do not start.  . If you choose to drink alcohol, please do not consume more than 2 drinks per day. One drink is considered to be 12 ounces (355 mL) of beer, 5 ounces (148 mL) of wine, or 1.5 ounces (44 mL) of liquor.  . If you are 79-60 years old, ask your health care provider if you should take aspirin to prevent strokes.  . Use sunscreen. Apply sunscreen liberally and repeatedly throughout the day. You should seek shade when your shadow is shorter than you. Protect yourself by wearing long sleeves, pants, a wide-brimmed hat, and sunglasses year round, whenever you are outdoors.  . Once a month, do a whole body skin exam, using a mirror to look at the skin on your back. Tell your health care provider of new moles, moles that have irregular borders, moles that are larger than a pencil eraser, or moles that have changed in shape or color.

## 2019-01-04 NOTE — Progress Notes (Signed)
Subjective:   Brooke Mitchell is a 68 y.o. female who presents for Medicare Annual (Subsequent) preventive examination.  Review of Systems:  N/A Cardiac Risk Factors include: advanced age (>21men, >27 women);diabetes mellitus;dyslipidemia;hypertension;obesity (BMI >30kg/m2)     Objective:     Vitals: There were no vitals taken for this visit.  There is no height or weight on file to calculate BMI.  Advanced Directives 01/04/2019 04/10/2018 12/29/2017 05/05/2017 10/20/2016 08/23/2016 03/24/2016  Does Patient Have a Medical Advance Directive? Yes Yes Yes Yes No;Yes No Yes  Type of Paramedic of [place];Living will [place];Living will Living will;Healthcare Power of Attorney Living will Living will;Healthcare Power of Attorney - -  Does patient want to make changes to medical advance directive? No - Patient declined No - Patient declined - - - - -  Copy of [place] in Chart? No - copy requested No - copy requested No - copy requested - No - copy requested - -  Would patient like information on creating a medical advance directive? - - - - - No - Patient declined -    Tobacco Social History   Tobacco Use  Smoking Status Never Smoker  Smokeless Tobacco Never Used     Counseling given: No   Clinical Intake:  Pre-visit preparation completed: Yes  Pain : No/denies pain Pain Score: 0-No pain     Nutritional Status: BMI > 30  Obese Nutritional Risks: None Diabetes: Yes CBG done?: No Did pt. bring in CBG monitor from home?: No  How often do you need to have someone help you when you read instructions, pamphlets, or other written materials from your doctor or pharmacy?: 1 - Never What is the last grade level you completed in school?: Master degree   Interpreter Needed?: No  Comments: pt is a widow and lives alone Information entered by :: LPinson, LPN  Past Medical History:  Diagnosis Date  . Allergic rhinitis    . Allergy   . Anxiety   . Arthritis    osteoarthritis  . Asthma    As a child   . Depression   . Diabetes mellitus    Type II (2/06 elevated microalbumin)  . Frequent UTI    with coital prohylaxis   . GERD (gastroesophageal reflux disease)   . Hyperlipidemia   . Hypertension   . Hypothyroidism   . IDA (iron deficiency anemia)   . Obesity   . OSA (obstructive sleep apnea)    CPAP  . Sleep apnea    wears c-pap   Past Surgical History:  Procedure Laterality Date  . BELPHAROPTOSIS REPAIR    . COLONOSCOPY    . DILATION AND CURETTAGE OF UTERUS    . ENDOMETRIAL BIOPSY    . FOOT SURGERY    . UPPER GASTROINTESTINAL ENDOSCOPY     Family History  Problem Relation Age of Onset  . Diabetes Mother   . Coronary artery disease Mother   . Hypothyroidism Mother   . Irritable bowel syndrome Mother   . Alzheimer's disease Father   . Nephrolithiasis Father   . Hypothyroidism Brother   . Breast cancer Paternal Grandmother 70  . Colon cancer Neg Hx   . Esophageal cancer Neg Hx   . Rectal cancer Neg Hx   . Stomach cancer Neg Hx    Social History   Socioeconomic History  . Marital status: Widowed    Spouse name: Not on file  . Number of  children: 2  . Years of education: Not on file  . Highest education level: Not on file  Occupational History  . Occupation: Product manager: [employer]    Employer: [employer]  . Financial resource strain: Not on file  . Food insecurity:    Worry: Not on file    Inability: Not on file  . Transportation needs:    Medical: Not on file    Non-medical: Not on file  Tobacco Use  . Smoking status: Never Smoker  . Smokeless tobacco: Never Used  Substance and Sexual Activity  . Alcohol use: Yes    Alcohol/week: 0.0 standard drinks    Comment: occasional  . Drug use: No  . Sexual activity: Not on file  Lifestyle  . Physical activity:    Days per week: Not on file    Minutes per session: Not on file  . Stress:  Not on file  Relationships  . Social connections:    Talks on phone: Not on file    Gets together: Not on file    Attends religious service: Not on file    Active member of club or organization: Not on file    Attends meetings of clubs or organizations: Not on file    Relationship status: Not on file  Other Topics Concern  . Not on file  Social History Narrative   Some walking for exercise   Widow 11/2013- Cared for husband full time with ALS at home     Outpatient Encounter Medications as of 01/04/2019  Medication Sig  . acetaminophen (TYLENOL) 325 MG tablet Take 650 mg by mouth as needed for pain.  Marland Kitchen albuterol (PROAIR HFA) 108 (90 Base) MCG/ACT inhaler USE 2 PUFFS UPTO EVERY 4 HOURS AS NEEDED FOR WHEEZING  . Ascorbic Acid (VITAMIN C) 100 MG tablet Take 100 mg by mouth daily.  Marland Kitchen aspirin 81 MG tablet Take 81 mg by mouth daily.    Marland Kitchen atorvastatin (LIPITOR) 20 MG tablet Take 0.5 tablets (10 mg total) by mouth daily.  . calcium carbonate (CALCIUM 600) 600 MG TABS Take 600 mg by mouth 2 (two) times daily with a meal.    . Cholecalciferol 4000 UNITS CAPS Take 4,000 Units by mouth daily. Pt takes 2 tablets of 2,000 units per day = 4,000 units a day  . Cyanocobalamin (VITAMIN B-12 PO) Take 6 mcg by mouth daily.  . Fluticasone-Salmeterol (ADVAIR DISKUS) 100-50 MCG/DOSE AEPB USE 1 PUFF TWICE DAILY DURING ALLERGY SEASON  . glipiZIDE (GLUCOTROL XL) 10 MG 24 hr tablet Take 1 tablet (10 mg total) by mouth daily.  Marland Kitchen glucose blood (ONE TOUCH ULTRA TEST) test strip CHECK BLOOD SUGAR TWICE A DAY (DX. E11.9)  . hyoscyamine (LEVSIN SL) 0.125 MG SL tablet Place one tablet under tongue once a day as needed.  . IRON, FERROUS GLUCONATE, PO Take 1 tablet by mouth 2 (two) times daily.   Marland Kitchen levothyroxine (SYNTHROID) 50 MCG tablet TAKE 1 TABLET (50 MCG TOTAL) BY MOUTH DAILY.  Marland Kitchen lisinopril-hydrochlorothiazide (PRINZIDE,ZESTORETIC) 10-12.5 MG tablet Take 1 tablet by mouth daily.  Marland Kitchen loratadine (CLARITIN) 10 MG tablet  Take 10 mg by mouth daily.    . meloxicam (MOBIC) 15 MG tablet TAKE 1 TABLET BY MOUTH DAILY AS NEEDED WITH FOOD  . metFORMIN (GLUCOPHAGE) 1000 MG tablet TAKE 1 TABLET (1,000 MG TOTAL) BY MOUTH 2 (TWO) TIMES DAILY WITH A MEAL.  . multivitamin (THERAGRAN) per tablet Take 1 tablet by mouth daily.  No facility-administered encounter medications on file as of 01/04/2019.     Activities of Daily Living In your present state of health, do you have any difficulty performing the following activities: 01/04/2019  Hearing? N  Vision? N  Difficulty concentrating or making decisions? N  Walking or climbing stairs? N  Dressing or bathing? N  Doing errands, shopping? N  Preparing Food and eating ? N  Using the Toilet? N  In the past six months, have you accidently leaked urine? N  Do you have problems with loss of bowel control? N  Managing your Medications? N  Managing your Finances? N  Housekeeping or managing your Housekeeping? N  Some recent data might be hidden    Patient Care Team: Tower, Wynelle Fanny, MD as PCP - General Clance, Armando Reichert, MD as Attending Physician (Pulmonary Disease)    Assessment:   This is a routine wellness examination for [place].  Vision Screening Comments: Vision exam in Fall 2019 with Dr. Ellin Mayhew  Exercise Activities and Dietary recommendations Current Exercise Habits: Home exercise routine, Type of exercise: walking, Time (Minutes): 30, Frequency (Times/Week): 4, Weekly Exercise (Minutes/Week): 120, Intensity: Mild, Exercise limited by: None identified  Goals    . Patient Stated     Starting 01/04/19,  I will continue to walk at least 30 minutes 3-4 days per week.        Fall Risk Fall Risk  01/04/2019 05/22/2018 04/10/2018 12/29/2017 12/23/2016  Falls in the past year? 1 No No Yes No  Comment slipped on wet floor; hit head - - tripped while shopping; tripped on sidewalk -  Number falls in past yr: 0 - - 2 or more -  Injury with Fall? 1 - - No -   Depression Screen  PHQ 2/9 Scores 01/04/2019 04/10/2018 12/29/2017 12/23/2016  PHQ - 2 Score 0 0 0 0  PHQ- 9 Score 0 - 0 -     Cognitive Function MMSE - Mini Mental State Exam 01/04/2019 12/29/2017  Orientation to time 5 5  Orientation to Place 5 5  Registration 3 3  Attention/ Calculation 0 0  Recall 3 2  Recall-comments - unable to recall 1 of 3 words  Language- name 2 objects 0 0  Language- repeat 1 1  Language- follow 3 step command 0 3  Language- read & follow direction 0 0  Write a sentence 0 0  Copy design 0 0  Total score 17 19     PLEASE NOTE: A Mini-Cog screen was completed. Maximum score is 17. A value of 0 denotes this part of [place] MMSE was not completed or the patient failed this part of the Mini-Cog screening.   Mini-Cog Screening Orientation to Time - Max 5 pts Orientation to Place - Max 5 pts Registration - Max 3 pts Recall - Max 3 pts Language Repeat - Max 1 pts      Immunization History  Administered Date(s) Administered  . H1N1 09/22/2008  . Influenza Split 05/31/2012  . Influenza Whole 06/05/2006, 06/29/2007, 07/09/2009, 08/04/2010  . Influenza,inj,Quad PF,6+ Mos 06/19/2013, 06/25/2014, 06/16/2015, 06/14/2016, 06/14/2017, 05/25/2018  . Pneumococcal Conjugate-13 12/23/2016  . Pneumococcal Polysaccharide-23 04/03/2009, 12/29/2017  . Td 01/04/2004, 03/31/2010  . Tdap 08/23/2016  . Zoster 04/29/2011  . Zoster Recombinat (Shingrix) 07/28/2017, 03/24/2018    Screening Tests Health Maintenance  Topic Date Due  . HEMOGLOBIN A1C  01/09/2019 (Originally 11/20/2018)  . OPHTHALMOLOGY EXAM  09/05/2019 (Originally 09/26/2018)  . INFLUENZA VACCINE  04/06/2019  . FOOT EXAM  05/26/2019  . MAMMOGRAM  02/07/2020  . COLONOSCOPY  01/18/2025  . TETANUS/TDAP  08/23/2026  . DEXA SCAN  Completed  . Hepatitis C Screening  Completed  . PNA vac Low Risk Adult  Completed      Plan:     I have personally reviewed, addressed, and noted the following in the patient's chart:  A. Medical  and social history B. Use of alcohol, tobacco or illicit drugs  C. Current medications and supplements D. Functional ability and status E.  Nutritional status F.  Physical activity G. Advance directives H. List of other physicians I.  Hospitalizations, surgeries, and ER visits in previous 12 months J.  Vitals (unless it is a telemedicine encounter) K. Screenings to include cognitive, depression, hearing, vision (NOTE: hearing and vision screenings not completed in telemedicine encounter) L. Referrals and appointments   In addition, I have reviewed and discussed with patient certain preventive protocols, quality metrics, and best practice recommendations. A written personalized care plan for preventive services and recommendations were provided to patient.  With patient's permission, we connected on 01/04/19 at 10:30 AM EDT by a video enabled telemedicine application. Two patient identifiers were used to ensure the encounter occurred with the correct person.    Patient was in home and writer was in office.   Signed,   Lindell Noe, MHA, BS, LPN Health Coach

## 2019-01-04 NOTE — Progress Notes (Signed)
PCP notes:   Health maintenance:  Eye exam - postponed A1C - PCP follow-up needed  Abnormal screenings:   Fall risk - hx of single fall Fall Risk  01/04/2019 05/22/2018 04/10/2018 12/29/2017 12/23/2016  Falls in the past year? 1 No No Yes No  Comment slipped on wet floor; hit head - - tripped while shopping; tripped on sidewalk -  Number falls in past yr: 0 - - 2 or more -  Injury with Fall? 1 - - No -   Patient concerns:   None  Nurse concerns:  None  Next PCP appt:   01/16/19 @ 1430  I reviewed health advisor's note, was available for consultation, and agree with documentation and plan. Loura Pardon MD

## 2019-01-07 ENCOUNTER — Other Ambulatory Visit: Payer: Self-pay | Admitting: Family Medicine

## 2019-01-07 ENCOUNTER — Telehealth: Payer: Self-pay | Admitting: Family Medicine

## 2019-01-07 DIAGNOSIS — I10 Essential (primary) hypertension: Secondary | ICD-10-CM

## 2019-01-07 DIAGNOSIS — E538 Deficiency of other specified B group vitamins: Secondary | ICD-10-CM

## 2019-01-07 DIAGNOSIS — E7800 Pure hypercholesterolemia, unspecified: Secondary | ICD-10-CM

## 2019-01-07 DIAGNOSIS — E039 Hypothyroidism, unspecified: Secondary | ICD-10-CM

## 2019-01-07 DIAGNOSIS — E119 Type 2 diabetes mellitus without complications: Secondary | ICD-10-CM

## 2019-01-07 NOTE — Telephone Encounter (Signed)
-----   Message from Eustace Pen, LPN sent at 09/13/3788 11:00 AM EDT ----- Regarding: Labs 5/6 Lab orders needed. Thank you.    NEG COVID screen. Curbside instructions given

## 2019-01-09 ENCOUNTER — Other Ambulatory Visit: Payer: Self-pay

## 2019-01-09 ENCOUNTER — Other Ambulatory Visit (INDEPENDENT_AMBULATORY_CARE_PROVIDER_SITE_OTHER): Payer: Medicare Other

## 2019-01-09 DIAGNOSIS — E119 Type 2 diabetes mellitus without complications: Secondary | ICD-10-CM

## 2019-01-09 DIAGNOSIS — E538 Deficiency of other specified B group vitamins: Secondary | ICD-10-CM

## 2019-01-09 DIAGNOSIS — E7800 Pure hypercholesterolemia, unspecified: Secondary | ICD-10-CM | POA: Diagnosis not present

## 2019-01-09 DIAGNOSIS — E039 Hypothyroidism, unspecified: Secondary | ICD-10-CM

## 2019-01-09 DIAGNOSIS — I10 Essential (primary) hypertension: Secondary | ICD-10-CM

## 2019-01-09 LAB — COMPREHENSIVE METABOLIC PANEL
ALT: 19 U/L (ref 0–35)
AST: 15 U/L (ref 0–37)
Albumin: 4 g/dL (ref 3.5–5.2)
Alkaline Phosphatase: 53 U/L (ref 39–117)
BUN: 16 mg/dL (ref 6–23)
CO2: 28 mEq/L (ref 19–32)
Calcium: 9.1 mg/dL (ref 8.4–10.5)
Chloride: 102 mEq/L (ref 96–112)
Creatinine, Ser: 0.93 mg/dL (ref 0.40–1.20)
GFR: 59.97 mL/min — ABNORMAL LOW (ref >60.00)
Glucose, Bld: 171 mg/dL — ABNORMAL HIGH (ref 70–99)
Potassium: 3.9 mEq/L (ref 3.5–5.1)
Sodium: 141 mEq/L (ref 135–145)
Total Bilirubin: 0.6 mg/dL (ref 0.2–1.2)
Total Protein: 6.6 g/dL (ref 6.0–8.3)

## 2019-01-09 LAB — CBC WITH DIFFERENTIAL/PLATELET
Basophils Absolute: 0 10*3/uL (ref 0.0–0.1)
Basophils Relative: 0.6 % (ref 0.0–3.0)
Eosinophils Absolute: 0.1 10*3/uL (ref 0.0–0.7)
Eosinophils Relative: 2.3 % (ref 0.0–5.0)
HCT: 40.6 % (ref 36.0–46.0)
Hemoglobin: 13.9 g/dL (ref 12.0–15.0)
Lymphocytes Relative: 29.5 % (ref 12.0–46.0)
Lymphs Abs: 1.9 10*3/uL (ref 0.7–4.0)
MCHC: 34.2 g/dL (ref 30.0–36.0)
MCV: 88.1 fl (ref 78.0–100.0)
Monocytes Absolute: 0.4 10*3/uL (ref 0.1–1.0)
Monocytes Relative: 6.3 % (ref 3.0–12.0)
Neutro Abs: 3.9 10*3/uL (ref 1.4–7.7)
Neutrophils Relative %: 61.3 % (ref 43.0–77.0)
Platelets: 267 10*3/uL (ref 150.0–400.0)
RBC: 4.6 Mil/uL (ref 3.87–5.11)
RDW: 12.4 % (ref 11.5–15.5)
WBC: 6.3 10*3/uL (ref 4.0–10.5)

## 2019-01-09 LAB — LIPID PANEL
Cholesterol: 146 mg/dL (ref 0–200)
HDL: 43.4 mg/dL (ref >39.00)
LDL Cholesterol: 63 mg/dL (ref 0–99)
NonHDL: 102.29
Total CHOL/HDL Ratio: 3
Triglycerides: 196 mg/dL — ABNORMAL HIGH (ref 0.0–149.0)
VLDL: 39.2 mg/dL (ref 0.0–40.0)

## 2019-01-09 LAB — HEMOGLOBIN A1C: Hgb A1c MFr Bld: 7.5 % — ABNORMAL HIGH (ref 4.6–6.5)

## 2019-01-09 LAB — TSH: TSH: 7.2 u[IU]/mL — ABNORMAL HIGH (ref 0.35–4.50)

## 2019-01-09 LAB — VITAMIN B12: Vitamin B-12: 611 pg/mL (ref 211–911)

## 2019-01-11 ENCOUNTER — Encounter: Payer: Medicare Other | Admitting: Family Medicine

## 2019-01-16 ENCOUNTER — Ambulatory Visit (INDEPENDENT_AMBULATORY_CARE_PROVIDER_SITE_OTHER): Payer: Medicare Other | Admitting: Family Medicine

## 2019-01-16 ENCOUNTER — Encounter: Payer: Self-pay | Admitting: Family Medicine

## 2019-01-16 VITALS — Wt 215.0 lb

## 2019-01-16 DIAGNOSIS — I10 Essential (primary) hypertension: Secondary | ICD-10-CM

## 2019-01-16 DIAGNOSIS — E039 Hypothyroidism, unspecified: Secondary | ICD-10-CM | POA: Diagnosis not present

## 2019-01-16 DIAGNOSIS — E7800 Pure hypercholesterolemia, unspecified: Secondary | ICD-10-CM

## 2019-01-16 DIAGNOSIS — D039 Melanoma in situ, unspecified: Secondary | ICD-10-CM

## 2019-01-16 DIAGNOSIS — E119 Type 2 diabetes mellitus without complications: Secondary | ICD-10-CM | POA: Diagnosis not present

## 2019-01-16 DIAGNOSIS — Z6836 Body mass index (BMI) 36.0-36.9, adult: Secondary | ICD-10-CM

## 2019-01-16 DIAGNOSIS — D509 Iron deficiency anemia, unspecified: Secondary | ICD-10-CM

## 2019-01-16 DIAGNOSIS — E538 Deficiency of other specified B group vitamins: Secondary | ICD-10-CM

## 2019-01-16 MED ORDER — GLIPIZIDE ER 10 MG PO TB24: 10.0000 mg | ORAL_TABLET | Freq: Every day | ORAL | 3 refills | Status: DC

## 2019-01-16 MED ORDER — METFORMIN HCL 1000 MG PO TABS
ORAL_TABLET | ORAL | 3 refills | Status: DC
Start: 2019-01-16 — End: 2019-11-11

## 2019-01-16 MED ORDER — FLUTICASONE-SALMETEROL 100-50 MCG/DOSE IN AEPB: INHALATION_SPRAY | RESPIRATORY_TRACT | 11 refills | Status: DC

## 2019-01-16 MED ORDER — ATORVASTATIN CALCIUM 20 MG PO TABS: 10.0000 mg | ORAL_TABLET | Freq: Every day | ORAL | 3 refills | Status: DC

## 2019-01-16 MED ORDER — LEVOTHYROXINE SODIUM 50 MCG PO TABS: ORAL_TABLET | ORAL | 3 refills | Status: DC

## 2019-01-16 MED ORDER — LISINOPRIL-HYDROCHLOROTHIAZIDE 10-12.5 MG PO TABS: 1.0000 | ORAL_TABLET | Freq: Every day | ORAL | 3 refills | Status: DC

## 2019-01-16 NOTE — Assessment & Plan Note (Signed)
Nl cbc with current iron intake

## 2019-01-16 NOTE — Assessment & Plan Note (Signed)
Pt plans to re schedule her annual derm appt  No new lesions per her report  Wears sun protection

## 2019-01-16 NOTE — Assessment & Plan Note (Signed)
Discussed how this problem influences overall health and the risks it imposes  Reviewed plan for weight loss with lower calorie diet (via better food choices and also portion control or program like weight watchers) and exercise building up to or more than 30 minutes 5 days per week including some aerobic activity    

## 2019-01-16 NOTE — Progress Notes (Signed)
Virtual Visit via Video Note  I connected with Brooke Mitchell on 01/16/19 at  2:30 PM EDT by a video enabled telemedicine application and verified that I am speaking with the correct person using two identifiers.  Location: Patient: home Provider: office    I discussed the limitations of evaluation and management by telemedicine and the availability of in person appointments. The patient expressed understanding and agreed to proceed.  History of Present Illness: Here for f/u of chronic health problems   Has been feeling ok in general   amw was done on 5/1 Noted one slip and fall this year No other concerns or gaps  No more falls /being careful   Eye exam 1/19  Has one scheduled in June  No vision changes   Mammogram 6/19 - she does not have it scheduled yet  Self breast exam - no breast lumps   dexa 4/18 - BMD in the normal range  No fractures  Taking ca and D  Exercise - walking the dog and also eliptical bike   She has had the shingrix vaccine   Wt Readings from Last 3 Encounters:  09/25/18 213 lb (96.6 kg)  07/03/18 211 lb 8 oz (95.9 kg)  05/25/18 211 lb 8 oz (95.9 kg)  she gained about 3 lb   215 lb  Same activity level    bp is stable today  No cp or palpitations or headaches or edema  No side effects to medicines  BP Readings from Last 3 Encounters:  09/25/18 124/68  07/03/18 132/80  05/25/18 124/76    Has not felt like it is high or low   Lab Results  Component Value Date   CREATININE 0.93 01/09/2019   BUN 16 01/09/2019   NA 141 01/09/2019   K 3.9 01/09/2019   CL 102 01/09/2019   CO2 28 01/09/2019   Lab Results  Component Value Date   ALT 19 01/09/2019   AST 15 01/09/2019   ALKPHOS 53 01/09/2019   BILITOT 0.6 01/09/2019   Lab Results  Component Value Date   WBC 6.3 01/09/2019   HGB 13.9 01/09/2019   HCT 40.6 01/09/2019   MCV 88.1 01/09/2019   PLT 267.0 01/09/2019  no anemia   Hypothyroidism  Pt has no clinical changes No change in  energy level/ hair or skin/ edema and no tremor Lab Results  Component Value Date   TSH 7.20 (H) 01/09/2019    She has not missed any doses in the past 2 weeks Need to increase dose from 25 mcg and will go up to 50 mcg   DM2 Lab Results  Component Value Date   HGBA1C 7.5 (H) 01/09/2019   this is up from 6.5 in the fall  She has been eating more carbs -perhaps more bread Some pasta  Some white potato-french fries  No rice  No sweets   Just started checking- 180-190 at night and first thing in the am    Glipizide- takes in am / wonders if she can take later  Metformin  No problems  Ace for renal protection   on statin   Hyperlipidemia Lab Results  Component Value Date   CHOL 146 01/09/2019   CHOL 145 05/22/2018   CHOL 151 11/21/2017   Lab Results  Component Value Date   HDL 43.40 01/09/2019   HDL 39.50 05/22/2018   HDL 45.50 11/21/2017   Lab Results  Component Value Date   LDLCALC 63 01/09/2019   [place] 80  12/16/2015   LDLCALC 77 01/20/2014   Lab Results  Component Value Date   TRIG 196.0 (H) 01/09/2019   TRIG 214.0 (H) 05/22/2018   TRIG 231.0 (H) 11/21/2017   Lab Results  Component Value Date   CHOLHDL 3 01/09/2019   CHOLHDL 4 05/22/2018   CHOLHDL 3 11/21/2017   Lab Results  Component Value Date   LDLDIRECT 85.0 05/22/2018   LDLDIRECT 97.0 11/21/2017   LDLDIRECT 84.0 03/27/2017   Atorvastatin - no problems  Red meat once per week  Too much sausage   Hx of melanoma in situ No follow up recently - had appt early in the pandemic for yearly check -will re schedule   B12 def Lab Results  Component Value Date   VITAMINB12 611 01/09/2019  takes orally every other day  Hematologist oversees   Review of Systems  Constitutional: Negative for chills, fever, malaise/fatigue and weight loss.  HENT: Negative for hearing loss and sore throat.   Eyes: Negative for blurred vision.  Respiratory: Negative for cough and shortness of breath.    Cardiovascular: Negative for chest pain, palpitations, claudication and leg swelling.  Gastrointestinal: Negative for abdominal pain, blood in stool, heartburn, nausea and vomiting.  Genitourinary: Negative for dysuria.  Musculoskeletal: Negative for myalgias.       One fall this year   Skin: Negative for itching and rash.  Neurological: Negative for dizziness, focal weakness and headaches.  Endo/Heme/Allergies: Positive for environmental allergies. Negative for polydipsia.  Psychiatric/Behavioral: Negative for depression. The patient is not nervous/anxious.     Patient Active Problem List   Diagnosis Date Noted  . Melanoma in situ ([place]) 01/07/2018  . Welcome to Medicare preventive visit 12/23/2016  . Screening mammogram, encounter for 12/23/2016  . Estrogen deficiency 12/23/2016  . Neck pain 08/30/2016  . Foot pain, left 12/23/2015  . Urge incontinence 12/23/2015  . Anemia, iron deficiency 02/19/2015  . Vitamin B 12 deficiency 12/05/2014  . Fatigue 11/26/2014  . Grief reaction 01/22/2014  . Colon cancer screening 10/24/2012  . Class 2 severe obesity due to excess calories with serious comorbidity and body mass index (BMI) of 36.0 to 36.9 in adult ([place]) 10/19/2011  . Other screening mammogram 04/13/2011  . Routine general medical examination at a health care facility 04/07/2011  . UTI'S, RECURRENT 02/03/2010  . POSTMENOPAUSAL STATUS 03/28/2008  . Hypothyroidism 11/24/2006  . Diabetes type 2, controlled ([place]) 11/24/2006  . Hyperlipidemia 11/24/2006  . RESTLESS LEG SYNDROME 11/24/2006  . Essential hypertension 11/24/2006  . PREMATURE VENTRICULAR CONTRACTIONS 11/24/2006  . HEMORRHOIDS 11/24/2006  . ALLERGIC RHINITIS 11/24/2006  . GERD 11/24/2006  . HIATAL HERNIA 11/24/2006  . OVERACTIVE BLADDER 11/24/2006  . [place] DISEASE, CERVICAL 11/24/2006  . Sleep apnea 11/24/2006   Past Medical History:  Diagnosis Date  . Allergic rhinitis   . Allergy   . Anxiety   . Arthritis     osteoarthritis  . Asthma    As a child   . Depression   . Diabetes mellitus    Type II (2/06 elevated microalbumin)  . Frequent UTI    with coital prohylaxis   . GERD (gastroesophageal reflux disease)   . Hyperlipidemia   . Hypertension   . Hypothyroidism   . IDA (iron deficiency anemia)   . Obesity   . OSA (obstructive sleep apnea)    CPAP  . Sleep apnea    wears c-pap   Past Surgical History:  Procedure Laterality Date  . BELPHAROPTOSIS REPAIR    .  COLONOSCOPY    . DILATION AND CURETTAGE OF UTERUS    . ENDOMETRIAL BIOPSY    . FOOT SURGERY    . UPPER GASTROINTESTINAL ENDOSCOPY     Social History   Tobacco Use  . Smoking status: Never Smoker  . Smokeless tobacco: Never Used  Substance Use Topics  . Alcohol use: Yes    Alcohol/week: 0.0 standard drinks    Comment: occasional  . Drug use: No   Family History  Problem Relation Age of Onset  . Diabetes Mother   . Coronary artery disease Mother   . Hypothyroidism Mother   . Irritable bowel syndrome Mother   . Alzheimer's disease Father   . Nephrolithiasis Father   . Hypothyroidism Brother   . Breast cancer Paternal Grandmother 37  . Colon cancer Neg Hx   . Esophageal cancer Neg Hx   . Rectal cancer Neg Hx   . Stomach cancer Neg Hx    No Known Allergies [facility] Medications on File Prior to Visit  Medication Sig Dispense Refill  . acetaminophen (TYLENOL) 325 MG tablet Take 650 mg by mouth as needed for pain.    Marland Kitchen albuterol (PROAIR HFA) 108 (90 Base) MCG/ACT inhaler USE 2 PUFFS UPTO EVERY 4 HOURS AS NEEDED FOR WHEEZING 8.5 g 5  . Ascorbic Acid (VITAMIN C) 100 MG tablet Take 100 mg by mouth daily.    Marland Kitchen aspirin 81 MG tablet Take 81 mg by mouth daily.      . calcium carbonate (CALCIUM 600) 600 MG TABS Take 600 mg by mouth 2 (two) times daily with a meal.      . Cholecalciferol 4000 UNITS CAPS Take 4,000 Units by mouth daily. Pt takes 2 tablets of 2,000 units per day = 4,000 units a day    .  Cyanocobalamin (VITAMIN B-12 PO) Take 6 mcg by mouth daily.    Marland Kitchen glucose blood (ONE TOUCH ULTRA TEST) test strip CHECK BLOOD SUGAR TWICE A DAY (DX. E11.9) 100 each 2  . hyoscyamine (LEVSIN SL) 0.125 MG SL tablet Place one tablet under tongue once a day as needed. 30 tablet 1  . IRON, FERROUS GLUCONATE, PO Take 1 tablet by mouth 2 (two) times daily.     Marland Kitchen loratadine (CLARITIN) 10 MG tablet Take 10 mg by mouth daily.      . meloxicam (MOBIC) 15 MG tablet TAKE 1 TABLET BY MOUTH DAILY AS NEEDED WITH FOOD 90 tablet 1  . multivitamin (THERAGRAN) per tablet Take 1 tablet by mouth daily.       No [facility] facility-administered medications on file prior to visit.     Observations/Objective: Patient appears well, in no distress Weight is baseline (obese) No facial swelling or asymmetry Normal voice-not hoarse and no slurred speech No obvious tremor or mobility impairment Moving neck and UEs normally Able to hear the call well  Talkative and mentally sharp with no cognitive changes No skin changes on face or neck , no rash or pallor Affect is normal    Assessment and Plan: Problem List Items Addressed This Visit      Cardiovascular and Mediastinum   Essential hypertension    bp in fair control at this time  BP Readings from Last 1 Encounters:  09/25/18 124/68   No changes needed Most recent labs reviewed  Disc lifstyle change with low sodium diet and exercise        Relevant Medications   atorvastatin (LIPITOR) 20 MG tablet   lisinopril-hydrochlorothiazide (ZESTORETIC) 10-12.5  MG tablet     Endocrine   Hypothyroidism    Lab Results  Component Value Date   TSH 7.20 (H) 01/09/2019   Will inc levothyroxine to 50 mcg daily (was prev taking qod) Lab in 4-6 weeks       Relevant Medications   levothyroxine (SYNTHROID) 50 MCG tablet   Other Relevant Orders   TSH   Diabetes type 2, controlled ([place]) - Primary    Lab Results  Component Value Date   HGBA1C 7.5 (H) 01/09/2019    This is up from 6.5 (from poor eating) Disc plan to cut processed carbs  Continue regular exercise and work on wt loss Continue glipizide (may move to afternoon based on high sugar in pm and early am) Continue ace and statin        Relevant Medications   atorvastatin (LIPITOR) 20 MG tablet   lisinopril-hydrochlorothiazide (ZESTORETIC) 10-12.5 MG tablet   glipiZIDE (GLUCOTROL XL) 10 MG 24 hr tablet   metFORMIN (GLUCOPHAGE) 1000 MG tablet     Other   Hyperlipidemia    Disc goals for lipids and reasons to control them Rev last labs with pt Rev low sat fat diet in detail  Controlled with atorvastatin       Relevant Medications   atorvastatin (LIPITOR) 20 MG tablet   lisinopril-hydrochlorothiazide (ZESTORETIC) 10-12.5 MG tablet   Class 2 severe obesity due to excess calories with serious comorbidity and body mass index (BMI) of 36.0 to 36.9 in adult [facility])    Discussed how this problem influences overall health and the risks it imposes  Reviewed plan for weight loss with lower calorie diet (via better food choices and also portion control or program like weight watchers) and exercise building up to or more than 30 minutes 5 days per week including some aerobic activity         Relevant Medications   glipiZIDE (GLUCOTROL XL) 10 MG 24 hr tablet   metFORMIN (GLUCOPHAGE) 1000 MG tablet   Vitamin B 12 deficiency    Lab Results  Component Value Date   VITAMINB12 611 01/09/2019   Adv to continue [facility] supplementation       Anemia, iron deficiency    Nl cbc with [facility] iron intake       Melanoma in situ ([place])    Pt plans to re schedule her annual derm appt  No new lesions per her report  Wears sun protection           Follow Up Instructions: Diabetes is worse so try to eat better Try to get most of your carbohydrates from produce (with the exception of white potatoes)  Eat less bread/pasta/rice/snack foods/cereals/sweets and other items from the middle of the  grocery store (processed carbs)  For cholesterol Avoid red meat/ fried foods/ egg yolks/ fatty breakfast meats/ butter, cheese and high fat dairy/ and shellfish   Also keep up exercise - at least 30 minutes of extra exercise per day Weight loss would help also   Thyroid number is off so you need more thyroid medicine Increase your levothyroxine from 25 to 50 mcg once daily  Take in am at least 4 hours apart from any supplements with calcium   Our office will call you to schedule thyroid labs in 4-6 weeks and also a follow up with me (we will do A1C that day) in 3 months   Don't forget to get caught up with dermatology and mammogram when you are able   Take care  of yourself!   I discussed the assessment and treatment plan with the patient. The patient was provided an opportunity to ask questions and all were answered. The patient agreed with the plan and demonstrated an understanding of the instructions.   The patient was advised to call back or seek an in-person evaluation if the symptoms worsen or if the condition fails to improve as anticipated.  Loura Pardon, MD

## 2019-01-16 NOTE — Assessment & Plan Note (Signed)
Disc goals for lipids and reasons to control them Rev last labs with pt Rev low sat fat diet in detail  Controlled with atorvastatin

## 2019-01-16 NOTE — Assessment & Plan Note (Signed)
Lab Results  Component Value Date   TSH 7.20 (H) 01/09/2019   Will inc levothyroxine to 50 mcg daily (was prev taking qod) Lab in 4-6 weeks

## 2019-01-16 NOTE — Assessment & Plan Note (Signed)
Lab Results  Component Value Date   [id number] 01/09/2019   Adv to continue current supplementation

## 2019-01-16 NOTE — Patient Instructions (Addendum)
Diabetes is worse so try to eat better Try to get most of your carbohydrates from produce (with the exception of white potatoes)  Eat less bread/pasta/rice/snack foods/cereals/sweets and other items from the middle of the grocery store (processed carbs)  For cholesterol Avoid red meat/ fried foods/ egg yolks/ fatty breakfast meats/ butter, cheese and high fat dairy/ and shellfish   Also keep up exercise - at least 30 minutes of extra exercise per day Weight loss would help also   Thyroid number is off so you need more thyroid medicine Increase your levothyroxine from 25 to 50 mcg once daily  Take in am at least 4 hours apart from any supplements with calcium   Our office will call you to schedule thyroid labs in 4-6 weeks and also a follow up with me (we will do A1C that day) in 3 months   Don't forget to get caught up with dermatology and mammogram when you are able   Take care of yourself!

## 2019-01-16 NOTE — Assessment & Plan Note (Signed)
bp in fair control at this time  BP Readings from Last 1 Encounters:  09/25/18 124/68   No changes needed Most recent labs reviewed  Disc lifstyle change with low sodium diet and exercise

## 2019-01-16 NOTE — Assessment & Plan Note (Signed)
Lab Results  Component Value Date   HGBA1C 7.5 (H) 01/09/2019   This is up from 6.5 (from poor eating) Disc plan to cut processed carbs  Continue regular exercise and work on wt loss Continue glipizide (may move to afternoon based on high sugar in pm and early am) Continue ace and statin

## 2019-02-19 ENCOUNTER — Other Ambulatory Visit (INDEPENDENT_AMBULATORY_CARE_PROVIDER_SITE_OTHER): Payer: Medicare Other

## 2019-02-19 DIAGNOSIS — E039 Hypothyroidism, unspecified: Secondary | ICD-10-CM | POA: Diagnosis not present

## 2019-02-19 DIAGNOSIS — E7800 Pure hypercholesterolemia, unspecified: Secondary | ICD-10-CM | POA: Diagnosis not present

## 2019-02-19 DIAGNOSIS — I10 Essential (primary) hypertension: Secondary | ICD-10-CM

## 2019-02-19 DIAGNOSIS — E119 Type 2 diabetes mellitus without complications: Secondary | ICD-10-CM

## 2019-02-19 LAB — COMPREHENSIVE METABOLIC PANEL
ALT: 13 U/L (ref 0–35)
AST: 13 U/L (ref 0–37)
Albumin: 3.9 g/dL (ref 3.5–5.2)
Alkaline Phosphatase: 41 U/L (ref 39–117)
BUN: 20 mg/dL (ref 6–23)
CO2: 28 mEq/L (ref 19–32)
Calcium: 9 mg/dL (ref 8.4–10.5)
Chloride: 104 mEq/L (ref 96–112)
Creatinine, Ser: 0.78 mg/dL (ref 0.40–1.20)
GFR: 73.44 mL/min (ref >60.00)
Glucose, Bld: 179 mg/dL — ABNORMAL HIGH (ref 70–99)
Potassium: 3.7 mEq/L (ref 3.5–5.1)
Sodium: 140 mEq/L (ref 135–145)
Total Bilirubin: 0.5 mg/dL (ref 0.2–1.2)
Total Protein: 6.3 g/dL (ref 6.0–8.3)

## 2019-02-19 LAB — LIPID PANEL
Cholesterol: 131 mg/dL (ref 0–200)
HDL: 39.9 mg/dL (ref >39.00)
LDL Cholesterol: 51 mg/dL (ref 0–99)
NonHDL: 90.63
Total CHOL/HDL Ratio: 3
Triglycerides: 200 mg/dL — ABNORMAL HIGH (ref 0.0–149.0)
VLDL: 40 mg/dL (ref 0.0–40.0)

## 2019-02-19 LAB — TSH: TSH: 1.45 u[IU]/mL (ref 0.35–4.50)

## 2019-02-19 LAB — HEMOGLOBIN A1C: Hgb A1c MFr Bld: 7.2 % — ABNORMAL HIGH (ref 4.6–6.5)

## 2019-02-20 ENCOUNTER — Other Ambulatory Visit: Payer: Medicare Other

## 2019-04-16 ENCOUNTER — Ambulatory Visit: Payer: Medicare Other | Admitting: Family Medicine

## 2019-04-16 ENCOUNTER — Encounter: Payer: Self-pay | Admitting: Family Medicine

## 2019-04-16 ENCOUNTER — Other Ambulatory Visit: Payer: Self-pay

## 2019-04-16 VITALS — BP 126/78 | HR 86 | Temp 98.1°F | Ht 64.25 in | Wt 208.2 lb

## 2019-04-16 DIAGNOSIS — E785 Hyperlipidemia, unspecified: Secondary | ICD-10-CM

## 2019-04-16 DIAGNOSIS — E039 Hypothyroidism, unspecified: Secondary | ICD-10-CM | POA: Diagnosis not present

## 2019-04-16 DIAGNOSIS — E119 Type 2 diabetes mellitus without complications: Secondary | ICD-10-CM

## 2019-04-16 DIAGNOSIS — E1169 Type 2 diabetes mellitus with other specified complication: Secondary | ICD-10-CM

## 2019-04-16 DIAGNOSIS — I10 Essential (primary) hypertension: Secondary | ICD-10-CM

## 2019-04-16 DIAGNOSIS — Z6836 Body mass index (BMI) 36.0-36.9, adult: Secondary | ICD-10-CM

## 2019-04-16 MED ORDER — SITAGLIPTIN PHOSPHATE 100 MG PO TABS: 100.0000 mg | ORAL_TABLET | Freq: Every day | ORAL | 3 refills | Status: DC

## 2019-04-16 NOTE — Progress Notes (Signed)
Subjective:    Patient ID: Brooke Mitchell, female    DOB: 1950/09/17, 68 y.o.   MRN: [id number]  HPI Here for f/u of chronic medical problems   Wt Readings from Last 3 Encounters:  04/16/19 208 lb 4 oz (94.5 kg)  01/16/19 215 lb (97.5 kg)  09/25/18 213 lb (96.6 kg)  down 7 lb !  Eating better  Exercising some - lots of long walks (plans on recumbent bike)  35.47 kg/m   bp is stable today  No cp or palpitations or headaches or edema  No side effects to medicines  BP Readings from Last 3 Encounters:  04/16/19 126/78  09/25/18 124/68  07/03/18 132/80     DM2 Lab Results  Component Value Date   HGBA1C 7.2 (H) 02/19/2019  down from 7.5 prior  Is having high readings in the am  150 to 200s  During the day -  120s-130s  Metformin 1000 bid  Glipizide xl 10 mg  Tried taking it later- it did not make a difference  Eye exam 02/20/19- no retinopathy    If she exercises -helps some  Pretty close to a diabetic diet      Hypothyroidism  Pt has no clinical changes No change in energy level/ hair or skin/ edema and no tremor Lab Results  Component Value Date   TSH 1.45 02/19/2019    After dose adjustment-better   Hyperlipidemia Lab Results  Component Value Date   CHOL 131 02/19/2019   CHOL 146 01/09/2019   CHOL 145 05/22/2018   Lab Results  Component Value Date   HDL 39.90 02/19/2019   HDL 43.40 01/09/2019   HDL 39.50 05/22/2018   Lab Results  Component Value Date   LDLCALC 51 02/19/2019   LDLCALC 63 01/09/2019   [place] 80 12/16/2015   Lab Results  Component Value Date   TRIG 200.0 (H) 02/19/2019   TRIG 196.0 (H) 01/09/2019   TRIG 214.0 (H) 05/22/2018   Lab Results  Component Value Date   CHOLHDL 3 02/19/2019   CHOLHDL 3 01/09/2019   CHOLHDL 4 05/22/2018   Lab Results  Component Value Date   LDLDIRECT 85.0 05/22/2018   LDLDIRECT 97.0 11/21/2017   LDLDIRECT 84.0 03/27/2017  well controlled LDL with statin and diet   Patient Active Problem  List   Diagnosis Date Noted  . Melanoma in situ ([place]) 01/07/2018  . Welcome to Medicare preventive visit 12/23/2016  . Screening mammogram, encounter for 12/23/2016  . Estrogen deficiency 12/23/2016  . Neck pain 08/30/2016  . Foot pain, left 12/23/2015  . Urge incontinence 12/23/2015  . Anemia, iron deficiency 02/19/2015  . Vitamin B 12 deficiency 12/05/2014  . Fatigue 11/26/2014  . Grief reaction 01/22/2014  . Colon cancer screening 10/24/2012  . Class 2 severe obesity due to excess calories with serious comorbidity and body mass index (BMI) of 36.0 to 36.9 in adult ([place]) 10/19/2011  . Other screening mammogram 04/13/2011  . Routine general medical examination at a health care facility 04/07/2011  . UTI'S, RECURRENT 02/03/2010  . POSTMENOPAUSAL STATUS 03/28/2008  . Hypothyroidism 11/24/2006  . Diabetes type 2, controlled ([place]) 11/24/2006  . Hyperlipidemia associated with type 2 diabetes mellitus ([place]) 11/24/2006  . RESTLESS LEG SYNDROME 11/24/2006  . Essential hypertension 11/24/2006  . PREMATURE VENTRICULAR CONTRACTIONS 11/24/2006  . HEMORRHOIDS 11/24/2006  . ALLERGIC RHINITIS 11/24/2006  . GERD 11/24/2006  . HIATAL HERNIA 11/24/2006  . OVERACTIVE BLADDER 11/24/2006  . [place] DISEASE, CERVICAL 11/24/2006  .  Sleep apnea 11/24/2006   Past Medical History:  Diagnosis Date  . Allergic rhinitis   . Allergy   . Anxiety   . Arthritis    osteoarthritis  . Asthma    As a child   . Depression   . Diabetes mellitus    Type II (2/06 elevated microalbumin)  . Frequent UTI    with coital prohylaxis   . GERD (gastroesophageal reflux disease)   . Hyperlipidemia   . Hypertension   . Hypothyroidism   . IDA (iron deficiency anemia)   . Obesity   . OSA (obstructive sleep apnea)    CPAP  . Sleep apnea    wears c-pap   Past Surgical History:  Procedure Laterality Date  . BELPHAROPTOSIS REPAIR    . COLONOSCOPY    . DILATION AND CURETTAGE OF UTERUS    . ENDOMETRIAL BIOPSY    .  FOOT SURGERY    . UPPER GASTROINTESTINAL ENDOSCOPY     Social History   Tobacco Use  . Smoking status: Never Smoker  . Smokeless tobacco: Never Used  Substance Use Topics  . Alcohol use: Yes    Alcohol/week: 0.0 standard drinks    Comment: occasional  . Drug use: No   Family History  Problem Relation Age of Onset  . Diabetes Mother   . Coronary artery disease Mother   . Hypothyroidism Mother   . Irritable bowel syndrome Mother   . Alzheimer's disease Father   . Nephrolithiasis Father   . Hypothyroidism Brother   . Breast cancer Paternal Grandmother 28  . Colon cancer Neg Hx   . Esophageal cancer Neg Hx   . Rectal cancer Neg Hx   . Stomach cancer Neg Hx    No Known Allergies Current Outpatient Medications on File Prior to Visit  Medication Sig Dispense Refill  . acetaminophen (TYLENOL) 325 MG tablet Take 650 mg by mouth as needed for pain.    Marland Kitchen albuterol (PROAIR HFA) 108 (90 Base) MCG/ACT inhaler USE 2 PUFFS UPTO EVERY 4 HOURS AS NEEDED FOR WHEEZING 8.5 g 5  . Ascorbic Acid (VITAMIN C) 100 MG tablet Take 100 mg by mouth daily.    Marland Kitchen aspirin 81 MG tablet Take 81 mg by mouth daily.      Marland Kitchen atorvastatin (LIPITOR) 20 MG tablet Take 0.5 tablets (10 mg total) by mouth daily. 45 tablet 3  . calcium carbonate (CALCIUM 600) 600 MG TABS Take 600 mg by mouth 2 (two) times daily with a meal.      . Cholecalciferol 4000 UNITS CAPS Take 4,000 Units by mouth daily. Pt takes 2 tablets of 2,000 units per day = 4,000 units a day    . Cyanocobalamin (VITAMIN B-12 PO) Take 6 mcg by mouth daily.    . Fluticasone-Salmeterol (ADVAIR DISKUS) 100-50 MCG/DOSE AEPB USE 1 PUFF TWICE DAILY DURING ALLERGY SEASON 60 each 11  . glipiZIDE (GLUCOTROL XL) 10 MG 24 hr tablet Take 1 tablet (10 mg total) by mouth daily. 90 tablet 3  . glucose blood (ONE TOUCH ULTRA TEST) test strip CHECK BLOOD SUGAR TWICE A DAY (DX. E11.9) 100 each 2  . hyoscyamine (LEVSIN SL) 0.125 MG SL tablet Place one tablet under tongue once  a day as needed. 30 tablet 1  . IRON, FERROUS GLUCONATE, PO Take 1 tablet by mouth 2 (two) times daily.     Marland Kitchen levothyroxine (SYNTHROID) 50 MCG tablet TAKE 1 TABLET (50 MCG TOTAL) BY MOUTH DAILY. 90 tablet 3  .  lisinopril-hydrochlorothiazide (ZESTORETIC) 10-12.5 MG tablet Take 1 tablet by mouth daily. 90 tablet 3  . loratadine (CLARITIN) 10 MG tablet Take 10 mg by mouth daily.      . meloxicam (MOBIC) 15 MG tablet TAKE 1 TABLET BY MOUTH DAILY AS NEEDED WITH FOOD 90 tablet 1  . metFORMIN (GLUCOPHAGE) 1000 MG tablet TAKE 1 TABLET (1,000 MG TOTAL) BY MOUTH 2 (TWO) TIMES DAILY WITH A MEAL. 180 tablet 3  . multivitamin (THERAGRAN) per tablet Take 1 tablet by mouth daily.       No current facility-administered medications on file prior to visit.      Review of Systems  Constitutional: Negative for activity change, appetite change, fatigue, fever and unexpected weight change.  HENT: Negative for congestion, ear pain, rhinorrhea, sinus pressure and sore throat.   Eyes: Negative for pain, redness and visual disturbance.  Respiratory: Negative for cough, shortness of breath and wheezing.   Cardiovascular: Negative for chest pain and palpitations.  Gastrointestinal: Negative for abdominal pain, blood in stool, constipation and diarrhea.  Endocrine: Negative for polydipsia and polyuria.  Genitourinary: Negative for dysuria, frequency and urgency.  Musculoskeletal: Negative for arthralgias, back pain and myalgias.  Skin: Negative for pallor and rash.  Allergic/Immunologic: Negative for environmental allergies.  Neurological: Negative for dizziness, syncope and headaches.  Hematological: Negative for adenopathy. Does not bruise/bleed easily.  Psychiatric/Behavioral: Negative for decreased concentration and dysphoric mood. The patient is not nervous/anxious.        Some stressors including the pandemic       Objective:   Physical Exam Constitutional:      General: She is not in acute distress.     Appearance: Normal appearance. She is well-developed. She is obese. She is not ill-appearing or diaphoretic.  HENT:     Head: Normocephalic and atraumatic.     Mouth/Throat:     Mouth: Mucous membranes are moist.  Eyes:     General: No scleral icterus.    Conjunctiva/sclera: Conjunctivae normal.     Pupils: Pupils are equal, round, and reactive to light.  Neck:     Musculoskeletal: Normal range of motion and neck supple. No neck rigidity or muscular tenderness.     Thyroid: No thyromegaly.     Vascular: No carotid bruit or JVD.  Cardiovascular:     Rate and Rhythm: Normal rate and regular rhythm.     Pulses: Normal pulses.     Heart sounds: Normal heart sounds. No gallop.   Pulmonary:     Effort: Pulmonary effort is normal. No respiratory distress.     Breath sounds: Normal breath sounds. No wheezing or rales.     Comments: Good air exch Abdominal:     General: Bowel sounds are normal. There is no distension or abdominal bruit.     Palpations: Abdomen is soft. There is no mass.     Tenderness: There is no abdominal tenderness.  Musculoskeletal:     Right lower leg: No edema.     Left lower leg: No edema.  Lymphadenopathy:     Cervical: No cervical adenopathy.  Skin:    General: Skin is warm and dry.     Coloration: Skin is not pale.     Findings: No rash.  Neurological:     Mental Status: She is alert. Mental status is at baseline.     Sensory: No sensory deficit.     Coordination: Coordination normal.     Deep Tendon Reflexes: Reflexes are normal and symmetric. Reflexes  normal.  Psychiatric:        Mood and Affect: Mood normal.           Assessment & Plan:   Problem List Items Addressed This Visit      Cardiovascular and Mediastinum   Essential hypertension    bp in fair control at this time  BP Readings from Last 1 Encounters:  04/16/19 126/78   No changes needed Most recent labs reviewed  Disc lifstyle change with low sodium diet and exercise  Enc  further wt loss         Endocrine   Hypothyroidism    Hypothyroidism  Pt has no clinical changes No change in energy level/ hair or skin/ edema and no tremor Lab Results  Component Value Date   TSH 1.45 02/19/2019          Diabetes type 2, controlled ([place]) - Primary    Lab Results  Component Value Date   HGBA1C 7.2 (H) 02/19/2019   Slowly improved Per pt am glucose readings are still quite high  Watching diet and working on wt loss  Will try januvia 100 mg daily  Continue metformin  Hold glipizide the first week of januvia -then add back if needed inst to check glucose bid and prn  Try to exercise more regularly  F/u 3 mo      Relevant Medications   sitaGLIPtin (JANUVIA) 100 MG tablet   Hyperlipidemia associated with type 2 diabetes mellitus ([place])    Disc goals for lipids and reasons to control them Rev last labs with pt Rev low sat fat diet in detail LDL of 51 with statin and diet  Inc trig likely due to blood glucose elevation      Relevant Medications   sitaGLIPtin (JANUVIA) 100 MG tablet     Other   Class 2 severe obesity due to excess calories with serious comorbidity and body mass index (BMI) of 36.0 to 36.9 in adult [facility])    Discussed how this problem influences overall health and the risks it imposes  Reviewed plan for weight loss with lower calorie diet (via better food choices and also portion control or program like weight watchers) and exercise building up to or more than 30 minutes 5 days per week including some aerobic activity   Commended on wt loss so far       Relevant Medications   sitaGLIPtin (JANUVIA) 100 MG tablet

## 2019-04-16 NOTE — Assessment & Plan Note (Signed)
bp in fair control at this time  BP Readings from Last 1 Encounters:  04/16/19 126/78   No changes needed Most recent labs reviewed  Disc lifstyle change with low sodium diet and exercise  Enc further wt loss

## 2019-04-16 NOTE — Assessment & Plan Note (Signed)
Discussed how this problem influences overall health and the risks it imposes  Reviewed plan for weight loss with lower calorie diet (via better food choices and also portion control or program like weight watchers) and exercise building up to or more than 30 minutes 5 days per week including some aerobic activity   Commended on wt loss so far  

## 2019-04-16 NOTE — Assessment & Plan Note (Signed)
Hypothyroidism  Pt has no clinical changes No change in energy level/ hair or skin/ edema and no tremor Lab Results  Component Value Date   TSH 1.45 02/19/2019

## 2019-04-16 NOTE — Assessment & Plan Note (Signed)
Lab Results  Component Value Date   HGBA1C 7.2 (H) 02/19/2019   Slowly improved Per pt am glucose readings are still quite high  Watching diet and working on wt loss  Will try januvia 100 mg daily  Continue metformin  Hold glipizide the first week of januvia -then add back if needed inst to check glucose bid and prn  Try to exercise more regularly  F/u 3 mo

## 2019-04-16 NOTE — Patient Instructions (Addendum)
Continue metformin 1000 mg twice daily  Start januvia 100 mg once daily in am (for the first week hold your glipizide) If glucose is well controlled without glipizide do not start it   If not- start the glipizide back and watch closely for low glucose readings (below 70)  If low- hold it again and let me know   In meantime- exercise daily 30 minutes or more Keep loosing weight  Diabetic diet- best you can   Follow up in 3 months with labs prior

## 2019-04-16 NOTE — Assessment & Plan Note (Signed)
Disc goals for lipids and reasons to control them Rev last labs with pt Rev low sat fat diet in detail LDL of 51 with statin and diet  Inc trig likely due to blood glucose elevation

## 2019-04-27 ENCOUNTER — Other Ambulatory Visit: Payer: Self-pay | Admitting: Family Medicine

## 2019-04-29 ENCOUNTER — Encounter: Payer: Self-pay | Admitting: Family Medicine

## 2019-04-30 NOTE — Telephone Encounter (Signed)
mobic last filled on 10/08/18 #90 tabs with 1 refill, 3 month f/u scheduled on 07/22/19

## 2019-05-07 ENCOUNTER — Ambulatory Visit: Payer: Medicare Other

## 2019-05-08 ENCOUNTER — Ambulatory Visit (INDEPENDENT_AMBULATORY_CARE_PROVIDER_SITE_OTHER): Payer: Medicare Other

## 2019-05-08 DIAGNOSIS — Z23 Encounter for immunization: Secondary | ICD-10-CM | POA: Diagnosis not present

## 2019-06-18 ENCOUNTER — Other Ambulatory Visit: Payer: Self-pay | Admitting: Family Medicine

## 2019-06-18 DIAGNOSIS — Z1231 Encounter for screening mammogram for malignant neoplasm of breast: Secondary | ICD-10-CM

## 2019-07-17 ENCOUNTER — Other Ambulatory Visit: Payer: Self-pay

## 2019-07-17 ENCOUNTER — Ambulatory Visit
Admission: RE | Admit: 2019-07-17 | Discharge: 2019-07-17 | Disposition: A | Discharge status: Home / Self Care | Payer: Medicare Other | Source: Ambulatory Visit | Attending: Family Medicine | Admitting: Family Medicine

## 2019-07-17 DIAGNOSIS — Z1231 Encounter for screening mammogram for malignant neoplasm of breast: Secondary | ICD-10-CM | POA: Insufficient documentation

## 2019-07-18 ENCOUNTER — Other Ambulatory Visit: Payer: Medicare Other

## 2019-07-19 ENCOUNTER — Other Ambulatory Visit (INDEPENDENT_AMBULATORY_CARE_PROVIDER_SITE_OTHER): Payer: Medicare Other

## 2019-07-19 ENCOUNTER — Other Ambulatory Visit: Payer: Self-pay

## 2019-07-19 DIAGNOSIS — E119 Type 2 diabetes mellitus without complications: Secondary | ICD-10-CM

## 2019-07-19 DIAGNOSIS — E785 Hyperlipidemia, unspecified: Secondary | ICD-10-CM | POA: Diagnosis not present

## 2019-07-19 DIAGNOSIS — E1169 Type 2 diabetes mellitus with other specified complication: Secondary | ICD-10-CM

## 2019-07-19 DIAGNOSIS — I10 Essential (primary) hypertension: Secondary | ICD-10-CM | POA: Diagnosis not present

## 2019-07-19 LAB — LIPID PANEL
Cholesterol: 160 mg/dL (ref 0–200)
HDL: 38.4 mg/dL — ABNORMAL LOW (ref >39.00)
LDL Cholesterol: 83 mg/dL (ref 0–99)
NonHDL: 122.01
Total CHOL/HDL Ratio: 4
Triglycerides: 197 mg/dL — ABNORMAL HIGH (ref 0.0–149.0)
VLDL: 39.4 mg/dL (ref 0.0–40.0)

## 2019-07-19 LAB — COMPREHENSIVE METABOLIC PANEL
ALT: 12 U/L (ref 0–35)
AST: 12 U/L (ref 0–37)
Albumin: 4.2 g/dL (ref 3.5–5.2)
Alkaline Phosphatase: 44 U/L (ref 39–117)
BUN: 22 mg/dL (ref 6–23)
CO2: 30 mEq/L (ref 19–32)
Calcium: 9.3 mg/dL (ref 8.4–10.5)
Chloride: 103 mEq/L (ref 96–112)
Creatinine, Ser: 0.85 mg/dL (ref 0.40–1.20)
GFR: 66.42 mL/min (ref >60.00)
Glucose, Bld: 115 mg/dL — ABNORMAL HIGH (ref 70–99)
Potassium: 4.1 mEq/L (ref 3.5–5.1)
Sodium: 141 mEq/L (ref 135–145)
Total Bilirubin: 0.6 mg/dL (ref 0.2–1.2)
Total Protein: 6.4 g/dL (ref 6.0–8.3)

## 2019-07-19 LAB — HEMOGLOBIN A1C: Hgb A1c MFr Bld: 6.2 % (ref 4.6–6.5)

## 2019-07-22 ENCOUNTER — Other Ambulatory Visit: Payer: Self-pay

## 2019-07-22 ENCOUNTER — Encounter: Payer: Self-pay | Admitting: Family Medicine

## 2019-07-22 ENCOUNTER — Ambulatory Visit: Payer: Medicare Other | Admitting: Family Medicine

## 2019-07-22 VITALS — BP 120/72 | HR 85 | Temp 97.4°F | Ht 64.25 in | Wt 202.6 lb

## 2019-07-22 DIAGNOSIS — I10 Essential (primary) hypertension: Secondary | ICD-10-CM

## 2019-07-22 DIAGNOSIS — E1169 Type 2 diabetes mellitus with other specified complication: Secondary | ICD-10-CM

## 2019-07-22 DIAGNOSIS — E119 Type 2 diabetes mellitus without complications: Secondary | ICD-10-CM

## 2019-07-22 DIAGNOSIS — E785 Hyperlipidemia, unspecified: Secondary | ICD-10-CM

## 2019-07-22 NOTE — Progress Notes (Signed)
Subjective:    Patient ID: Brooke Mitchell, female    DOB: 03/02/1951, 68 y.o.   MRN: [id number]  HPI Here for f/u of chronic health problems   Wt Readings from Last 3 Encounters:  07/22/19 202 lb 9 oz (91.9 kg)  04/16/19 208 lb 4 oz (94.5 kg)  01/16/19 215 lb (97.5 kg)  she has lost weight  34.50 kg/m  bp is stable today  No cp or palpitations or headaches or edema  No side effects to medicines  BP Readings from Last 3 Encounters:  07/22/19 120/72  04/16/19 126/78  09/25/18 124/68     Pulse Readings from Last 3 Encounters:  07/22/19 85  04/16/19 86  09/25/18 94    DM2 Lab Results  Component Value Date   HGBA1C 6.2 07/19/2019   This is improved from 7.2  Doing better overall -eating better  Walking for exercise   Taking statin  Taking ace   Metformin  januvia Glipizide  All 3 are doing well  No side effects   Eye exam -had in the spring  Hyperlipidemia Lab Results  Component Value Date   CHOL 160 07/19/2019   CHOL 131 02/19/2019   CHOL 146 01/09/2019   Lab Results  Component Value Date   HDL 38.40 (L) 07/19/2019   HDL 39.90 02/19/2019   HDL 43.40 01/09/2019   Lab Results  Component Value Date   LDLCALC 83 07/19/2019   LDLCALC 51 02/19/2019   LDLCALC 63 01/09/2019   Lab Results  Component Value Date   TRIG 197.0 (H) 07/19/2019   TRIG 200.0 (H) 02/19/2019   TRIG 196.0 (H) 01/09/2019   Lab Results  Component Value Date   CHOLHDL 4 07/19/2019   CHOLHDL 3 02/19/2019   CHOLHDL 3 01/09/2019   Lab Results  Component Value Date   LDLDIRECT 85.0 05/22/2018   LDLDIRECT 97.0 11/21/2017   LDLDIRECT 84.0 03/27/2017   Statin and diet  Eating more nuts lately   Patient Active Problem List   Diagnosis Date Noted  . Melanoma in situ ([place]) 01/07/2018  . Welcome to Medicare preventive visit 12/23/2016  . Screening mammogram, encounter for 12/23/2016  . Estrogen deficiency 12/23/2016  . Neck pain 08/30/2016  . Foot pain, left 12/23/2015   . Urge incontinence 12/23/2015  . Anemia, iron deficiency 02/19/2015  . Vitamin B 12 deficiency 12/05/2014  . Fatigue 11/26/2014  . Grief reaction 01/22/2014  . Colon cancer screening 10/24/2012  . Class 2 severe obesity due to excess calories with serious comorbidity and body mass index (BMI) of 36.0 to 36.9 in adult ([place]) 10/19/2011  . Other screening mammogram 04/13/2011  . Routine general medical examination at a health care facility 04/07/2011  . UTI'S, RECURRENT 02/03/2010  . POSTMENOPAUSAL STATUS 03/28/2008  . Hypothyroidism 11/24/2006  . Diabetes type 2, controlled ([place]) 11/24/2006  . Hyperlipidemia associated with type 2 diabetes mellitus ([place]) 11/24/2006  . RESTLESS LEG SYNDROME 11/24/2006  . Essential hypertension 11/24/2006  . PREMATURE VENTRICULAR CONTRACTIONS 11/24/2006  . HEMORRHOIDS 11/24/2006  . ALLERGIC RHINITIS 11/24/2006  . GERD 11/24/2006  . HIATAL HERNIA 11/24/2006  . OVERACTIVE BLADDER 11/24/2006  . [place] DISEASE, CERVICAL 11/24/2006  . Sleep apnea 11/24/2006   Past Medical History:  Diagnosis Date  . Allergic rhinitis   . Allergy   . Anxiety   . Arthritis    osteoarthritis  . Asthma    As a child   . Depression   . Diabetes mellitus    Type  II (2/06 elevated microalbumin)  . Frequent UTI    with coital prohylaxis   . GERD (gastroesophageal reflux disease)   . Hyperlipidemia   . Hypertension   . Hypothyroidism   . IDA (iron deficiency anemia)   . Obesity   . OSA (obstructive sleep apnea)    CPAP  . Sleep apnea    wears c-pap   Past Surgical History:  Procedure Laterality Date  . BELPHAROPTOSIS REPAIR    . COLONOSCOPY    . DILATION AND CURETTAGE OF UTERUS    . ENDOMETRIAL BIOPSY    . FOOT SURGERY    . UPPER GASTROINTESTINAL ENDOSCOPY     Social History   Tobacco Use  . Smoking status: Never Smoker  . Smokeless tobacco: Never Used  Substance Use Topics  . Alcohol use: Yes    Alcohol/week: 0.0 standard drinks    Comment:  occasional  . Drug use: No   Family History  Problem Relation Age of Onset  . Diabetes Mother   . Coronary artery disease Mother   . Hypothyroidism Mother   . Irritable bowel syndrome Mother   . Alzheimer's disease Father   . Nephrolithiasis Father   . Hypothyroidism Brother   . Breast cancer Paternal Grandmother 2  . Colon cancer Neg Hx   . Esophageal cancer Neg Hx   . Rectal cancer Neg Hx   . Stomach cancer Neg Hx    No Known Allergies Current Outpatient Medications on File Prior to Visit  Medication Sig Dispense Refill  . acetaminophen (TYLENOL) 325 MG tablet Take 650 mg by mouth as needed for pain.    Marland Kitchen albuterol (PROAIR HFA) 108 (90 Base) MCG/ACT inhaler USE 2 PUFFS UPTO EVERY 4 HOURS AS NEEDED FOR WHEEZING 8.5 g 5  . Ascorbic Acid (VITAMIN C) 100 MG tablet Take 100 mg by mouth daily.    Marland Kitchen aspirin 81 MG tablet Take 81 mg by mouth daily.      Marland Kitchen atorvastatin (LIPITOR) 20 MG tablet Take 0.5 tablets (10 mg total) by mouth daily. 45 tablet 3  . calcium carbonate (CALCIUM 600) 600 MG TABS Take 600 mg by mouth 2 (two) times daily with a meal.      . Cholecalciferol 4000 UNITS CAPS Take 4,000 Units by mouth daily. Pt takes 2 tablets of 2,000 units per day = 4,000 units a day    . Cyanocobalamin (VITAMIN B-12 PO) Take 6 mcg by mouth daily.    . Fluticasone-Salmeterol (ADVAIR DISKUS) 100-50 MCG/DOSE AEPB USE 1 PUFF TWICE DAILY DURING ALLERGY SEASON 60 each 11  . glipiZIDE (GLUCOTROL XL) 10 MG 24 hr tablet Take 1 tablet (10 mg total) by mouth daily. 90 tablet 3  . hyoscyamine (LEVSIN SL) 0.125 MG SL tablet Place one tablet under tongue once a day as needed. 30 tablet 1  . IRON, FERROUS GLUCONATE, PO Take 1 tablet by mouth 2 (two) times daily.     Marland Kitchen levothyroxine (SYNTHROID) 50 MCG tablet TAKE 1 TABLET (50 MCG TOTAL) BY MOUTH DAILY. 90 tablet 3  . lisinopril-hydrochlorothiazide (ZESTORETIC) 10-12.5 MG tablet Take 1 tablet by mouth daily. 90 tablet 3  . loratadine (CLARITIN) 10 MG tablet  Take 10 mg by mouth daily.      . meloxicam (MOBIC) 15 MG tablet TAKE ONE TABLET BY MOUTH DAILY WITH FOOD AS NEEDED 90 tablet 1  . metFORMIN (GLUCOPHAGE) 1000 MG tablet TAKE 1 TABLET (1,000 MG TOTAL) BY MOUTH 2 (TWO) TIMES DAILY WITH A MEAL. [place]  tablet 3  . multivitamin (THERAGRAN) per tablet Take 1 tablet by mouth daily.      Glory Rosebush ULTRA test strip USE TO CHECK BLOOD SUGAR TWICE DAILY 100 strip 1  . sitaGLIPtin (JANUVIA) 100 MG tablet Take 1 tablet (100 mg total) by mouth daily. 30 tablet 3   No current facility-administered medications on file prior to visit.     Review of Systems  Constitutional: Negative for activity change, appetite change, fatigue, fever and unexpected weight change.  HENT: Negative for congestion, ear pain, rhinorrhea, sinus pressure and sore throat.   Eyes: Negative for pain, redness and visual disturbance.  Respiratory: Negative for cough, shortness of breath and wheezing.   Cardiovascular: Negative for chest pain and palpitations.  Gastrointestinal: Negative for abdominal pain, blood in stool, constipation and diarrhea.  Endocrine: Negative for polydipsia and polyuria.  Genitourinary: Negative for dysuria, frequency and urgency.  Musculoskeletal: Negative for arthralgias, back pain and myalgias.  Skin: Negative for pallor and rash.  Allergic/Immunologic: Negative for environmental allergies.  Neurological: Negative for dizziness, syncope and headaches.  Hematological: Negative for adenopathy. Does not bruise/bleed easily.  Psychiatric/Behavioral: Negative for decreased concentration and dysphoric mood. The patient is not nervous/anxious.        Stressed at times This makes her glucose level go up       Objective:   Physical Exam Constitutional:      General: She is not in acute distress.    Appearance: Normal appearance. She is well-developed. She is obese. She is not ill-appearing.  HENT:     Head: Normocephalic and atraumatic.     Mouth/Throat:      Mouth: Mucous membranes are moist.  Eyes:     Conjunctiva/sclera: Conjunctivae normal.     Pupils: Pupils are equal, round, and reactive to light.  Neck:     Musculoskeletal: Normal range of motion and neck supple. No muscular tenderness.     Thyroid: No thyromegaly.     Vascular: No carotid bruit or JVD.  Cardiovascular:     Rate and Rhythm: Normal rate and regular rhythm.     Heart sounds: Normal heart sounds. No gallop.   Pulmonary:     Effort: Pulmonary effort is normal. No respiratory distress.     Breath sounds: Normal breath sounds. No wheezing or rales.  Abdominal:     General: Bowel sounds are normal. There is no distension or abdominal bruit.     Palpations: Abdomen is soft. There is no mass.     Tenderness: There is no abdominal tenderness.     Hernia: No hernia is present.  Lymphadenopathy:     Cervical: No cervical adenopathy.  Skin:    General: Skin is warm and dry.     Findings: No rash.  Neurological:     Mental Status: She is alert.     Cranial Nerves: No cranial nerve deficit.     Sensory: No sensory deficit.     Motor: No weakness.     Coordination: Coordination normal.     Deep Tendon Reflexes: Reflexes are normal and symmetric.  Psychiatric:        Mood and Affect: Mood normal.           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

## 2019-07-22 NOTE — Assessment & Plan Note (Signed)
bp in fair control at this time  BP Readings from Last 1 Encounters:  07/22/19 120/72   No changes needed Most recent labs reviewed  Disc lifstyle change with low sodium diet and exercise

## 2019-07-22 NOTE — Assessment & Plan Note (Signed)
Improved control with better diet , wt loss and addn of januvia  Lab Results  Component Value Date   HGBA1C 6.2 07/19/2019   Enc her to keep up the good work  Sent for dm eye exam report  On statin and ace

## 2019-07-22 NOTE — Patient Instructions (Addendum)
For cholesterol  Avoid red meat/ fried foods/ egg yolks/ fatty breakfast meats/ butter, cheese and high fat dairy/ and shellfish    Your A1C is much improved   Keep up the good work   Keep working on [employer] loss /diet/exercise

## 2019-07-22 NOTE — Assessment & Plan Note (Signed)
LDL up slightly but trig down Statin and diet Disc goals for lipids and reasons to control them Rev last labs with pt Rev low sat fat diet in detail Will increase atorvastatin in the future if needed F/u 6 mo for annual exam

## 2019-08-12 ENCOUNTER — Other Ambulatory Visit: Payer: Self-pay | Admitting: Family Medicine

## 2019-09-01 ENCOUNTER — Encounter: Payer: Self-pay | Admitting: Emergency Medicine

## 2019-09-01 ENCOUNTER — Ambulatory Visit
Admission: EM | Admit: 2019-09-01 | Discharge: 2019-09-01 | Disposition: A | Discharge status: Home / Self Care | Payer: Medicare Other | Attending: Emergency Medicine | Admitting: Emergency Medicine

## 2019-09-01 ENCOUNTER — Other Ambulatory Visit: Payer: Self-pay

## 2019-09-01 DIAGNOSIS — X58XXXA Exposure to other specified factors, initial encounter: Secondary | ICD-10-CM

## 2019-09-01 DIAGNOSIS — S46912A Strain of unspecified muscle, fascia and tendon at shoulder and upper arm level, left arm, initial encounter: Secondary | ICD-10-CM

## 2019-09-01 NOTE — ED Provider Notes (Signed)
[facility] - [place], [place]   Name: Brooke Mitchell DOB: 1951/05/27 MRN: [id number] CSN: [id number] PCP: Abner Greenspan, MD  Arrival date and time:  09/01/19 1536  Chief Complaint:  Shoulder Pain (left)   NOTE: Prior to seeing the patient today, I have reviewed the triage nursing documentation and vital signs. Clinical staff has updated patient's PMH/PSHx, current medication list, and drug allergies/intolerances to ensure comprehensive history available to assist in medical decision making.   History:   HPI: Brooke Mitchell is a 68 y.o. female who presents today with complaints of shoulder pain.  She first noticed the pain yesterday.  She denies any acute injury before the pain started.  Today, she was reaching for her dog on a blanket, when she felt a sharp pop to the front of her shoulder and the pain radiated down her arm and up her neck.  She was unable to use her arm and her shoulder immediately after hearing that pop she treated the pain with Tylenol as soon as she could.  A few hours has passed since that injury, and she states she has regained some functionality of her shoulder and arm.  She still notes pain to deep palpation in the front of her shoulder and decreased strength to the arm.  Pain is currently at a 1 out of 10.  She denies any radiation of pain to her neck or down her arm   She's had previous pain to this shoulder which required cortisone shots.  Her last instance of that type of pain was a few years ago.   Past Medical History:  Diagnosis Date  . Allergic rhinitis   . Allergy   . Anxiety   . Arthritis    osteoarthritis  . Asthma    As a child   . Depression   . Diabetes mellitus    Type II (2/06 elevated microalbumin)  . Frequent UTI    with coital prohylaxis   . GERD (gastroesophageal reflux disease)   . Hyperlipidemia   . Hypertension   . Hypothyroidism   . IDA (iron deficiency anemia)   . Obesity   . OSA (obstructive sleep apnea)    CPAP  . Sleep apnea    wears c-pap    Past Surgical History:  Procedure Laterality Date  . BELPHAROPTOSIS REPAIR    . COLONOSCOPY    . DILATION AND CURETTAGE OF UTERUS    . ENDOMETRIAL BIOPSY    . FOOT SURGERY    . UPPER GASTROINTESTINAL ENDOSCOPY      Family History  Problem Relation Age of Onset  . Diabetes Mother   . Coronary artery disease Mother   . Hypothyroidism Mother   . Irritable bowel syndrome Mother   . Alzheimer's disease Father   . Nephrolithiasis Father   . Hypothyroidism Brother   . Breast cancer Paternal Grandmother 61  . Colon cancer Neg Hx   . Esophageal cancer Neg Hx   . Rectal cancer Neg Hx   . Stomach cancer Neg Hx     Social History   Tobacco Use  . Smoking status: Never Smoker  . Smokeless tobacco: Never Used  Substance Use Topics  . Alcohol use: Yes    Alcohol/week: 0.0 standard drinks    Comment: occasional  . Drug use: No    Patient Active Problem List   Diagnosis Date Noted  . Melanoma in situ ([place]) 01/07/2018  . Welcome to Medicare preventive visit 12/23/2016  .  Screening mammogram, encounter for 12/23/2016  . Estrogen deficiency 12/23/2016  . Neck pain 08/30/2016  . Foot pain, left 12/23/2015  . Urge incontinence 12/23/2015  . Anemia, iron deficiency 02/19/2015  . Vitamin B 12 deficiency 12/05/2014  . Fatigue 11/26/2014  . Grief reaction 01/22/2014  . Colon cancer screening 10/24/2012  . Class 2 severe obesity due to excess calories with serious comorbidity and body mass index (BMI) of 36.0 to 36.9 in adult ([place]) 10/19/2011  . Other screening mammogram 04/13/2011  . Routine general medical examination at a health care facility 04/07/2011  . UTI'S, RECURRENT 02/03/2010  . POSTMENOPAUSAL STATUS 03/28/2008  . Hypothyroidism 11/24/2006  . Diabetes type 2, controlled ([place]) 11/24/2006  . Hyperlipidemia associated with type 2 diabetes mellitus ([place]) 11/24/2006  . RESTLESS LEG SYNDROME 11/24/2006  . Essential hypertension  11/24/2006  . PREMATURE VENTRICULAR CONTRACTIONS 11/24/2006  . HEMORRHOIDS 11/24/2006  . ALLERGIC RHINITIS 11/24/2006  . GERD 11/24/2006  . HIATAL HERNIA 11/24/2006  . OVERACTIVE BLADDER 11/24/2006  . [place] DISEASE, CERVICAL 11/24/2006  . Sleep apnea 11/24/2006    Home Medications:    Current Meds  Medication Sig  . albuterol (PROAIR HFA) 108 (90 Base) MCG/ACT inhaler USE 2 PUFFS UPTO EVERY 4 HOURS AS NEEDED FOR WHEEZING  . Ascorbic Acid (VITAMIN C) 100 MG tablet Take 100 mg by mouth daily.  Marland Kitchen aspirin 81 MG tablet Take 81 mg by mouth daily.    Marland Kitchen atorvastatin (LIPITOR) 20 MG tablet Take 0.5 tablets (10 mg total) by mouth daily.  . calcium carbonate (CALCIUM 600) 600 MG TABS Take 600 mg by mouth 2 (two) times daily with a meal.    . Cholecalciferol 4000 UNITS CAPS Take 4,000 Units by mouth daily. Pt takes 2 tablets of 2,000 units per day = 4,000 units a day  . Cyanocobalamin (VITAMIN B-12 PO) Take 6 mcg by mouth daily.  . Fluticasone-Salmeterol (ADVAIR DISKUS) 100-50 MCG/DOSE AEPB USE 1 PUFF TWICE DAILY DURING ALLERGY SEASON  . glipiZIDE (GLUCOTROL XL) 10 MG 24 hr tablet Take 1 tablet (10 mg total) by mouth daily.  . hyoscyamine (LEVSIN SL) 0.125 MG SL tablet Place one tablet under tongue once a day as needed.  . IRON, FERROUS GLUCONATE, PO Take 1 tablet by mouth 2 (two) times daily.   Marland Kitchen JANUVIA 100 MG tablet TAKE ONE TABLET BY MOUTH DAILY  . levothyroxine (SYNTHROID) 50 MCG tablet TAKE 1 TABLET (50 MCG TOTAL) BY MOUTH DAILY.  Marland Kitchen lisinopril-hydrochlorothiazide (ZESTORETIC) 10-12.5 MG tablet Take 1 tablet by mouth daily.  Marland Kitchen loratadine (CLARITIN) 10 MG tablet Take 10 mg by mouth daily.    . meloxicam (MOBIC) 15 MG tablet TAKE ONE TABLET BY MOUTH DAILY WITH FOOD AS NEEDED  . metFORMIN (GLUCOPHAGE) 1000 MG tablet TAKE 1 TABLET (1,000 MG TOTAL) BY MOUTH 2 (TWO) TIMES DAILY WITH A MEAL.  . multivitamin (THERAGRAN) per tablet Take 1 tablet by mouth daily.      Allergies:   Patient has no  known allergies.  Review of Systems (ROS): Review of Systems  Musculoskeletal: Positive for arthralgias and myalgias. Negative for neck pain and neck stiffness.     Vital Signs: Today's Vitals   09/01/19 1551 09/01/19 1552 09/01/19 1559  BP:   (!) 153/72  Pulse:   86  Resp:   16  Temp:   99.1 F (37.3 C)  TempSrc:   Oral  SpO2:   100%  Weight:  206 lb (93.4 kg)   Height:  5' 4.5" (1.638  m)   PainSc: 3       Physical Exam: Physical Exam Vitals and nursing note reviewed.  Constitutional:      Appearance: Normal appearance.  Cardiovascular:     Pulses:          Radial pulses are 2+ on the right side and 2+ on the left side.  Musculoskeletal:     Left shoulder: No swelling. Decreased range of motion. Decreased strength.     Cervical back: Normal range of motion.     Comments: Patient notes pain with supination, extension and flexion under resistance, and rotation.  Decreased strength when grips were assessed.   Neurological:     Mental Status: She is alert.      Urgent Care Treatments / Results:   LABS: PLEASE NOTE: all labs that were ordered this encounter are listed, however only abnormal results are displayed. Labs Reviewed - No data to display  EKG: -None  RADIOLOGY: No results found.  PROCEDURES: Procedures  MEDICATIONS RECEIVED THIS VISIT: Medications - No data to display  PERTINENT CLINICAL COURSE NOTES/UPDATES:   Initial Impression / Assessment and Plan / Urgent Care Course:  Pertinent labs & imaging results that were available during my care of the patient were personally reviewed by me and considered in my medical decision making (see lab/imaging section of note for values and interpretations).  Brooke Mitchell is a 68 y.o. female who presents to [facility] today with complaints of left shoulder pain, diagnosed with shoulder strain, and treated as such with the directions below. NP and patient reviewed discharge instructions below during  visit.   Patient is well appearing overall in clinic today. She does not appear to be in any acute distress. Presenting symptoms (see HPI) and exam as documented above.   I have reviewed the follow up and strict return precautions for any new or worsening symptoms. Patient is aware of symptoms that would be deemed urgent/emergent, and would thus require further evaluation either here or in the emergency department. At the time of discharge, she verbalized understanding and consent with the discharge plan as it was reviewed with her. All questions were fielded by provider and/or clinic staff prior to patient discharge.    Final Clinical Impressions / Urgent Care Diagnoses:   Final diagnoses:  Strain of left shoulder, initial encounter    New Prescriptions:  [place] Controlled Substance Registry consulted? Not Applicable  No orders of the defined types were placed in this encounter.     Discharge Instructions     You are seen today for a left shoulder strain.  Continue treating the pain as you were at home: Tylenol 3 times a day, rotating heat and ice, and movement as tolerated.  Contact your local physical therapist for further evaluation and treatment.    Recommended Follow up Care:  Patient encouraged to follow up with the following provider within the specified time frame, or sooner as dictated by the severity of her symptoms. As always, she was instructed that for any urgent/emergent care needs, she should seek care either here or in the emergency department for more immediate evaluation.   Gertie Baron, DNP, NP-c    Gertie Baron, NP 09/01/19 1712

## 2019-09-01 NOTE — ED Triage Notes (Signed)
Patient c/o pain in her left side of her neck, shoulder and upper arm that started today.  Patient states that when she went to pick up something today she felt a pop in her left upper arm.  Patient denies falls.

## 2019-09-01 NOTE — Discharge Instructions (Addendum)
You are seen today for a left shoulder strain.  Continue treating the pain as you were at home: Tylenol 3 times a day, rotating heat and ice, and movement as tolerated.  Contact your local physical therapist for further evaluation and treatment.

## 2019-09-03 ENCOUNTER — Encounter: Payer: Self-pay | Admitting: Family Medicine

## 2019-09-03 ENCOUNTER — Other Ambulatory Visit: Payer: Self-pay | Admitting: Family Medicine

## 2019-09-24 DIAGNOSIS — H02839 Dermatochalasis of unspecified eye, unspecified eyelid: Secondary | ICD-10-CM | POA: Diagnosis not present

## 2019-09-24 DIAGNOSIS — E119 Type 2 diabetes mellitus without complications: Secondary | ICD-10-CM | POA: Diagnosis not present

## 2019-09-24 DIAGNOSIS — H04129 Dry eye syndrome of unspecified lacrimal gland: Secondary | ICD-10-CM | POA: Diagnosis not present

## 2019-09-24 DIAGNOSIS — H2513 Age-related nuclear cataract, bilateral: Secondary | ICD-10-CM | POA: Diagnosis not present

## 2019-09-24 DIAGNOSIS — H401131 Primary open-angle glaucoma, bilateral, mild stage: Secondary | ICD-10-CM | POA: Diagnosis not present

## 2019-09-24 LAB — HM DIABETES EYE EXAM

## 2019-09-27 ENCOUNTER — Encounter: Payer: Self-pay | Admitting: Family Medicine

## 2019-10-23 ENCOUNTER — Other Ambulatory Visit: Payer: Self-pay | Admitting: Family Medicine

## 2019-10-23 NOTE — Telephone Encounter (Signed)
CPE scheduled 01/20/20, last filled on 04/30/19 #90 tabs with 1 refill, please advise

## 2019-10-23 NOTE — Telephone Encounter (Signed)
I think this is a little early 

## 2019-10-24 ENCOUNTER — Other Ambulatory Visit: Payer: Self-pay | Admitting: Family Medicine

## 2019-10-25 NOTE — Telephone Encounter (Signed)
CPE scheduled for 01/20/20, last filled on 04/30/19 #90 tabs with 1 refill, please advise

## 2019-11-10 ENCOUNTER — Other Ambulatory Visit: Payer: Self-pay | Admitting: Family Medicine

## 2019-12-04 ENCOUNTER — Ambulatory Visit: Payer: Medicare PPO | Admitting: Family Medicine

## 2019-12-04 ENCOUNTER — Encounter: Payer: Self-pay | Admitting: Family Medicine

## 2019-12-04 ENCOUNTER — Other Ambulatory Visit: Payer: Self-pay

## 2019-12-04 VITALS — BP 132/74 | HR 88 | Temp 97.6°F | Resp 22 | Ht 64.25 in | Wt 212.0 lb

## 2019-12-04 DIAGNOSIS — S29012A Strain of muscle and tendon of back wall of thorax, initial encounter: Secondary | ICD-10-CM | POA: Diagnosis not present

## 2019-12-04 NOTE — Assessment & Plan Note (Signed)
Suspect it is related to poor posture given between the shoulder blade area. Hand out for some shoulder stretches and strengthen and encouraged rotation. If not improving she will reach out for PT referral.

## 2019-12-04 NOTE — Progress Notes (Signed)
   Subjective:     Brooke Mitchell is a 69 y.o. female presenting for Back Pain (towards the mid back, burning sensation. started about 1 week ago. No rash noticed. Has tried heat and ice and extra Tylenol.)     Back Pain This is a new problem. The current episode started in the past 7 days. The problem occurs 2 to 4 times per day. The pain is present in the thoracic spine. The quality of the pain is described as burning. The pain does not radiate. The pain is moderate. The pain is worse during the day. Pertinent negatives include no abdominal pain, bladder incontinence, bowel incontinence, fever, headaches, numbness, tingling or weakness. Risk factors include sedentary lifestyle and obesity. She has tried analgesics, heat, NSAIDs and ice for the symptoms. The treatment provided mild relief.   Takes Mobic for arthritic pain - knees/shoulder   Review of Systems  Constitutional: Negative for fever.  Gastrointestinal: Negative for abdominal pain and bowel incontinence.  Genitourinary: Negative for bladder incontinence.  Musculoskeletal: Positive for back pain.  Neurological: Negative for tingling, weakness, numbness and headaches.     Social History   Tobacco Use  Smoking Status Never Smoker  Smokeless Tobacco Never Used        Objective:    BP Readings from Last 3 Encounters:  12/04/19 132/74  09/01/19 (!) 153/72  07/22/19 120/72   Wt Readings from Last 3 Encounters:  12/04/19 212 lb (96.2 kg)  09/01/19 206 lb (93.4 kg)  07/22/19 202 lb 9 oz (91.9 kg)    BP 132/74   Pulse 88   Temp 97.6 F (36.4 C)   Resp (!) 22   Ht 5' 4.25" (1.632 m)   Wt 212 lb (96.2 kg)   SpO2 99%   BMI 36.11 kg/m    Physical Exam Constitutional:      General: She is not in acute distress.    Appearance: She is well-developed. She is not diaphoretic.  HENT:     Right Ear: External ear normal.     Left Ear: External ear normal.     Nose: Nose normal.  Eyes:     Conjunctiva/sclera:  Conjunctivae normal.  Cardiovascular:     Rate and Rhythm: Normal rate.  Pulmonary:     Effort: Pulmonary effort is normal.  Musculoskeletal:     Cervical back: Neck supple.     Comments: Back:  Inspection: no rash, erythema Palpation: TTP along the right and left mid thoracic paraspinous muscles  ROM: normal w/o pain Strength: Normal  Skin:    General: Skin is warm and dry.     Capillary Refill: Capillary refill takes less than 2 seconds.  Neurological:     Mental Status: She is alert. Mental status is at baseline.  Psychiatric:        Mood and Affect: Mood normal.        Behavior: Behavior normal.           Assessment & Plan:   Problem List Items Addressed This Visit      Musculoskeletal and Integument   Strain of thoracic back region - Primary    Suspect it is related to poor posture given between the shoulder blade area. Hand out for some shoulder stretches and strengthen and encouraged rotation. If not improving she will reach out for PT referral.           Return if symptoms worsen or fail to improve.  Lesleigh Noe, MD

## 2020-01-02 ENCOUNTER — Other Ambulatory Visit: Payer: Self-pay | Admitting: Family Medicine

## 2020-01-06 ENCOUNTER — Telehealth: Payer: Self-pay | Admitting: Family Medicine

## 2020-01-06 DIAGNOSIS — E039 Hypothyroidism, unspecified: Secondary | ICD-10-CM

## 2020-01-06 DIAGNOSIS — D509 Iron deficiency anemia, unspecified: Secondary | ICD-10-CM

## 2020-01-06 DIAGNOSIS — I10 Essential (primary) hypertension: Secondary | ICD-10-CM

## 2020-01-06 DIAGNOSIS — E1169 Type 2 diabetes mellitus with other specified complication: Secondary | ICD-10-CM

## 2020-01-06 DIAGNOSIS — E538 Deficiency of other specified B group vitamins: Secondary | ICD-10-CM

## 2020-01-06 DIAGNOSIS — E119 Type 2 diabetes mellitus without complications: Secondary | ICD-10-CM

## 2020-01-06 DIAGNOSIS — E785 Hyperlipidemia, unspecified: Secondary | ICD-10-CM

## 2020-01-06 NOTE — Telephone Encounter (Signed)
-----   Message from Ellamae Sia sent at 12/26/2019 10:41 AM EDT ----- Regarding: Lab orders for Wednesday, 5.5.21 Patient is scheduled for CPX labs, please order future labs, Thanks , Karna Christmas

## 2020-01-08 ENCOUNTER — Ambulatory Visit: Payer: Medicare Other

## 2020-01-08 ENCOUNTER — Other Ambulatory Visit (INDEPENDENT_AMBULATORY_CARE_PROVIDER_SITE_OTHER): Payer: Medicare PPO

## 2020-01-08 ENCOUNTER — Other Ambulatory Visit: Payer: Self-pay

## 2020-01-08 DIAGNOSIS — I10 Essential (primary) hypertension: Secondary | ICD-10-CM

## 2020-01-08 DIAGNOSIS — E538 Deficiency of other specified B group vitamins: Secondary | ICD-10-CM | POA: Diagnosis not present

## 2020-01-08 DIAGNOSIS — D509 Iron deficiency anemia, unspecified: Secondary | ICD-10-CM | POA: Diagnosis not present

## 2020-01-08 DIAGNOSIS — E1169 Type 2 diabetes mellitus with other specified complication: Secondary | ICD-10-CM | POA: Diagnosis not present

## 2020-01-08 DIAGNOSIS — E119 Type 2 diabetes mellitus without complications: Secondary | ICD-10-CM | POA: Diagnosis not present

## 2020-01-08 DIAGNOSIS — E039 Hypothyroidism, unspecified: Secondary | ICD-10-CM | POA: Diagnosis not present

## 2020-01-08 DIAGNOSIS — E785 Hyperlipidemia, unspecified: Secondary | ICD-10-CM | POA: Diagnosis not present

## 2020-01-08 LAB — LIPID PANEL
Cholesterol: 170 mg/dL (ref 0–200)
HDL: 45.6 mg/dL (ref >39.00)
LDL Cholesterol: 98 mg/dL (ref 0–99)
NonHDL: 124.42
Total CHOL/HDL Ratio: 4
Triglycerides: 133 mg/dL (ref 0.0–149.0)
VLDL: 26.6 mg/dL (ref 0.0–40.0)

## 2020-01-08 LAB — CBC WITH DIFFERENTIAL/PLATELET
Basophils Absolute: 0 10*3/uL (ref 0.0–0.1)
Basophils Relative: 0.5 % (ref 0.0–3.0)
Eosinophils Absolute: 0.1 10*3/uL (ref 0.0–0.7)
Eosinophils Relative: 1.9 % (ref 0.0–5.0)
HCT: 38.9 % (ref 36.0–46.0)
Hemoglobin: 13.1 g/dL (ref 12.0–15.0)
Lymphocytes Relative: 25.4 % (ref 12.0–46.0)
Lymphs Abs: 1.5 10*3/uL (ref 0.7–4.0)
MCHC: 33.6 g/dL (ref 30.0–36.0)
MCV: 89.9 fl (ref 78.0–100.0)
Monocytes Absolute: 0.5 10*3/uL (ref 0.1–1.0)
Monocytes Relative: 7.5 % (ref 3.0–12.0)
Neutro Abs: 3.9 10*3/uL (ref 1.4–7.7)
Neutrophils Relative %: 64.7 % (ref 43.0–77.0)
Platelets: 236 10*3/uL (ref 150.0–400.0)
RBC: 4.33 Mil/uL (ref 3.87–5.11)
RDW: 12.4 % (ref 11.5–15.5)
WBC: 6 10*3/uL (ref 4.0–10.5)

## 2020-01-08 LAB — COMPREHENSIVE METABOLIC PANEL
ALT: 14 U/L (ref 0–35)
AST: 14 U/L (ref 0–37)
Albumin: 4 g/dL (ref 3.5–5.2)
Alkaline Phosphatase: 42 U/L (ref 39–117)
BUN: 20 mg/dL (ref 6–23)
CO2: 31 mEq/L (ref 19–32)
Calcium: 9.1 mg/dL (ref 8.4–10.5)
Chloride: 103 mEq/L (ref 96–112)
Creatinine, Ser: 0.85 mg/dL (ref 0.40–1.20)
GFR: 66.33 mL/min (ref >60.00)
Glucose, Bld: 131 mg/dL — ABNORMAL HIGH (ref 70–99)
Potassium: 4 mEq/L (ref 3.5–5.1)
Sodium: 141 mEq/L (ref 135–145)
Total Bilirubin: 0.6 mg/dL (ref 0.2–1.2)
Total Protein: 6.2 g/dL (ref 6.0–8.3)

## 2020-01-08 LAB — TSH: TSH: 3.2 u[IU]/mL (ref 0.35–4.50)

## 2020-01-08 LAB — VITAMIN B12: Vitamin B-12: 323 pg/mL (ref 211–911)

## 2020-01-08 LAB — HEMOGLOBIN A1C: Hgb A1c MFr Bld: 6.4 % (ref 4.6–6.5)

## 2020-01-13 ENCOUNTER — Ambulatory Visit (INDEPENDENT_AMBULATORY_CARE_PROVIDER_SITE_OTHER): Payer: Medicare PPO

## 2020-01-13 DIAGNOSIS — Z0000 Encounter for general adult medical examination without abnormal findings: Secondary | ICD-10-CM | POA: Diagnosis not present

## 2020-01-13 NOTE — Progress Notes (Signed)
Subjective:   Brooke Mitchell is a 69 y.o. female who presents for Medicare Annual (Subsequent) preventive examination.  Review of Systems: N/A   This visit is being conducted through telemedicine via telephone at the nurse health advisor's home address due to the COVID-19 pandemic. This patient has given me verbal consent via doximity to conduct this visit, patient states they are participating from their home address. Patient and myself are on the telephone call. There is no referral for this visit. Some vital signs may be absent or patient reported.    Patient identification: identified by name, DOB, and current address   Cardiac Risk Factors include: advanced age (>81men, >20 women);diabetes mellitus;hypertension;dyslipidemia     Objective:     Vitals: There were no vitals taken for this visit.  There is no height or weight on file to calculate BMI.  Advanced Directives 01/13/2020 09/01/2019 01/04/2019 04/10/2018 12/29/2017 05/05/2017 10/20/2016  Does Patient Have a Medical Advance Directive? Yes Yes Yes Yes Yes Yes No;Yes  Type of Paramedic of [place];Living will [place];Living will [place];Living will [place];Living will Living will;Healthcare Power of Attorney Living will Living will;Healthcare Power of Attorney  Does patient want to make changes to medical advance directive? - - No - Patient declined No - Patient declined - - -  Copy of [place] in Chart? No - copy requested - No - copy requested No - copy requested No - copy requested - No - copy requested  Would patient like information on creating a medical advance directive? - - - - - - -    Tobacco Social History   Tobacco Use  Smoking Status Never Smoker  Smokeless Tobacco Never Used     Counseling given: Not Answered   Clinical Intake:  Pre-visit preparation completed: Yes  Pain : 0-10 Pain Score: 6  Pain  Type: Chronic pain Pain Location: Back Pain Orientation: Lower Pain Descriptors / Indicators: Burning Pain Onset: More than a month ago Pain Frequency: Intermittent     Nutritional Risks: None Diabetes: Yes CBG done?: No Did pt. bring in CBG monitor from home?: No  How often do you need to have someone help you when you read instructions, pamphlets, or other written materials from your doctor or pharmacy?: 1 - Never What is the last grade level you completed in school?: masters  Interpreter Needed?: No  Information entered by :: CJohnson, LPN  Past Medical History:  Diagnosis Date  . Allergic rhinitis   . Allergy   . Anxiety   . Arthritis    osteoarthritis  . Asthma    As a child   . Depression   . Diabetes mellitus    Type II (2/06 elevated microalbumin)  . Frequent UTI    with coital prohylaxis   . GERD (gastroesophageal reflux disease)   . Hyperlipidemia   . Hypertension   . Hypothyroidism   . IDA (iron deficiency anemia)   . Obesity   . OSA (obstructive sleep apnea)    CPAP  . Sleep apnea    wears c-pap   Past Surgical History:  Procedure Laterality Date  . BELPHAROPTOSIS REPAIR    . COLONOSCOPY    . DILATION AND CURETTAGE OF UTERUS    . ENDOMETRIAL BIOPSY    . FOOT SURGERY    . UPPER GASTROINTESTINAL ENDOSCOPY     Family History  Problem Relation Age of Onset  . Diabetes Mother   .  Coronary artery disease Mother   . Hypothyroidism Mother   . Irritable bowel syndrome Mother   . Alzheimer's disease Father   . Nephrolithiasis Father   . Hypothyroidism Brother   . Breast cancer Paternal Grandmother 65  . Colon cancer Neg Hx   . Esophageal cancer Neg Hx   . Rectal cancer Neg Hx   . Stomach cancer Neg Hx    Social History   Socioeconomic History  . Marital status: Widowed    Spouse name: Not on file  . Number of children: 2  . Years of education: Not on file  . Highest education level: Not on file  Occupational History  . Occupation:  Product manager: [employer]    Employer: RETIRED  Tobacco Use  . Smoking status: Never Smoker  . Smokeless tobacco: Never Used  Substance and Sexual Activity  . Alcohol use: Yes    Alcohol/week: 0.0 standard drinks    Comment: occasional  . Drug use: No  . Sexual activity: Not on file  Other Topics Concern  . Not on file  Social History Narrative   Some walking for exercise   Widow 11/2013- Cared for husband full time with ALS at home    Social Determinants of Health   Financial Resource Strain: Low Risk   . Difficulty of Paying Living Expenses: Not hard at all  Food Insecurity: No Food Insecurity  . Worried About Charity fundraiser in the Last Year: Never true  . Ran Out of Food in the Last Year: Never true  Transportation Needs: No Transportation Needs  . Lack of Transportation (Medical): No  . Lack of Transportation (Non-Medical): No  Physical Activity: Inactive  . Days of Exercise per Week: 0 days  . Minutes of Exercise per Session: 0 min  Stress: No Stress Concern Present  . Feeling of Stress : Not at all  Social Connections:   . Frequency of Communication with Friends and Family:   . Frequency of Social Gatherings with Friends and Family:   . Attends Religious Services:   . Active Member of Clubs or Organizations:   . Attends Archivist Meetings:   Marland Kitchen Marital Status:     Outpatient Encounter Medications as of 01/13/2020  Medication Sig  . acetaminophen (TYLENOL) 325 MG tablet Take 650 mg by mouth as needed for pain.  Marland Kitchen albuterol (PROAIR HFA) 108 (90 Base) MCG/ACT inhaler USE 2 PUFFS UPTO EVERY 4 HOURS AS NEEDED FOR WHEEZING  . Ascorbic Acid (VITAMIN C) 100 MG tablet Take 100 mg by mouth daily.  Marland Kitchen aspirin 81 MG tablet Take 81 mg by mouth daily.    Marland Kitchen atorvastatin (LIPITOR) 20 MG tablet Take 0.5 tablets (10 mg total) by mouth daily.  . calcium carbonate (CALCIUM 600) 600 MG TABS Take 600 mg by mouth 2 (two) times daily with a meal.    .  Cholecalciferol 4000 UNITS CAPS Take 4,000 Units by mouth daily. Pt takes 2 tablets of 2,000 units per day = 4,000 units a day  . Cyanocobalamin (VITAMIN B-12 PO) Take 6 mcg by mouth daily.  . Fluticasone-Salmeterol (ADVAIR DISKUS) 100-50 MCG/DOSE AEPB USE 1 PUFF TWICE DAILY DURING ALLERGY SEASON  . glipiZIDE (GLUCOTROL XL) 10 MG 24 hr tablet Take 1 tablet (10 mg total) by mouth daily.  . hyoscyamine (LEVSIN SL) 0.125 MG SL tablet Place one tablet under tongue once a day as needed.  . IRON, FERROUS GLUCONATE, PO Take 1 tablet by  mouth 2 (two) times daily.   Marland Kitchen JANUVIA 100 MG tablet TAKE ONE TABLET BY MOUTH DAILY  . levothyroxine (SYNTHROID) 50 MCG tablet TAKE ONE TABLET BY MOUTH DAILY  . lisinopril-hydrochlorothiazide (ZESTORETIC) 10-12.5 MG tablet Take 1 tablet by mouth daily.  Marland Kitchen loratadine (CLARITIN) 10 MG tablet Take 10 mg by mouth daily.    . meloxicam (MOBIC) 15 MG tablet TAKE ONE TABLET BY MOUTH DAILY WITH FOOD AS NEEDED  . metFORMIN (GLUCOPHAGE) 1000 MG tablet TAKE ONE TABLET BY MOUTH TWICE A DAY WITH A MEAL  . multivitamin (THERAGRAN) per tablet Take 1 tablet by mouth daily.    Glory Rosebush ULTRA test strip USE TO CHECK BLOOD SUGAR TWO TIMES A DAY   No facility-administered encounter medications on file as of 01/13/2020.    Activities of Daily Living In your present state of health, do you have any difficulty performing the following activities: 01/13/2020  Hearing? N  Vision? N  Difficulty concentrating or making decisions? N  Walking or climbing stairs? N  Dressing or bathing? N  Doing errands, shopping? N  Preparing Food and eating ? N  Using the Toilet? N  In the past six months, have you accidently leaked urine? Y  Comment wears panty liner  Do you have problems with loss of bowel control? N  Managing your Medications? N  Managing your Finances? N  Housekeeping or managing your Housekeeping? N  Some recent data might be hidden    Patient Care Team: Tower, Wynelle Fanny, MD as  PCP - General Clance, Armando Reichert, MD as Attending Physician (Pulmonary Disease)    Assessment:   This is a routine wellness examination for [place].  Exercise Activities and Dietary recommendations Current Exercise Habits: The patient does not participate in regular exercise at present, Exercise limited by: None identified  Goals    . Patient Stated     Starting 01/04/19,  I will continue to walk at least 30 minutes 3-4 days per week.     . Patient Stated     01/13/2020, I will maintain and continue medications as prescribed.        Fall Risk Fall Risk  01/13/2020 01/04/2019 05/22/2018 04/10/2018 12/29/2017  Falls in the past year? 1 1 No No Yes  Comment tripped on sidewalk slipped on wet floor; hit head - - tripped while shopping; tripped on sidewalk  Number falls in past yr: 0 0 - - 2 or more  Injury with Fall? 0 1 - - No  Risk for fall due to : Medication side effect - - - -  Follow up Falls evaluation completed;Falls prevention discussed - - - -   Is the patient's home free of loose throw rugs in walkways, pet beds, electrical cords, etc?   yes      Grab bars in the bathroom? yes      Handrails on the stairs?   yes      Adequate lighting?   yes  Timed Get Up and Go performed: N/A  Depression Screen PHQ 2/9 Scores 01/13/2020 01/04/2019 04/10/2018 12/29/2017  PHQ - 2 Score 0 0 0 0  PHQ- 9 Score 0 0 - 0     Cognitive Function MMSE - Mini Mental State Exam 01/13/2020 01/04/2019 12/29/2017  Orientation to time 5 5 5   Orientation to Place 5 5 5   Registration 3 3 3   Attention/ Calculation 5 0 0  Recall 3 3 2   Recall-comments - - unable to recall 1 of 3 words  Language- name 2 objects - 0 0  Language- repeat 1 1 1   Language- follow 3 step command - 0 3  Language- read & follow direction - 0 0  Write a sentence - 0 0  Copy design - 0 0  Total score - 17 19  Mini Cog  Mini-Cog screen was completed. Maximum score is 22. A value of 0 denotes this part of the MMSE was not completed or the  patient failed this part of the Mini-Cog screening.       Immunization History  Administered Date(s) Administered  . Fluad Quad(high Dose 65+) 05/08/2019  . H1N1 09/22/2008  . Influenza Split 05/31/2012  . Influenza Whole 06/05/2006, 06/29/2007, 07/09/2009, 08/04/2010  . Influenza,inj,Quad PF,6+ Mos 06/19/2013, 06/25/2014, 06/16/2015, 06/14/2016, 06/14/2017, 05/25/2018  . PFIZER SARS-COV-2 Vaccination 09/25/2019, 10/16/2019  . Pneumococcal Conjugate-13 12/23/2016  . Pneumococcal Polysaccharide-23 04/03/2009, 12/29/2017  . Td 01/04/2004, 03/31/2010  . Tdap 08/23/2016  . Zoster 04/29/2011  . Zoster Recombinat (Shingrix) 07/28/2017, 03/24/2018    Qualifies for Shingles Vaccine: Completed series  Screening Tests Health Maintenance  Topic Date Due  . INFLUENZA VACCINE  04/05/2020  . HEMOGLOBIN A1C  07/10/2020  . FOOT EXAM  07/21/2020  . OPHTHALMOLOGY EXAM  09/23/2020  . MAMMOGRAM  07/16/2021  . COLONOSCOPY  01/18/2025  . TETANUS/TDAP  08/23/2026  . DEXA SCAN  Completed  . COVID-19 Vaccine  Completed  . Hepatitis C Screening  Completed  . PNA vac Low Risk Adult  Completed    Cancer Screenings: Lung: Low Dose CT Chest recommended if Age 48-80 years, 30 pack-year currently smoking OR have quit w/in 15 years. Patient does not qualify. Breast:  Up to date on Mammogram: Yes, completed 07/17/2019   Bone Density/Dexa: completed 01/02/2017 Colorectal: completed 01/19/2015  Additional Screenings:  Hepatitis C Screening: N/A     Plan:    Patient will maintain and continue medications as prescribed.   I have personally reviewed and noted the following in the patient's chart:   . Medical and social history . Use of alcohol, tobacco or illicit drugs  . Current medications and supplements . Functional ability and status . Nutritional status . Physical activity . Advanced directives . List of other physicians . Hospitalizations, surgeries, and ER visits in previous 12 months .  Vitals . Screenings to include cognitive, depression, and falls . Referrals and appointments  In addition, I have reviewed and discussed with patient certain preventive protocols, quality metrics, and best practice recommendations. A written personalized care plan for preventive services as well as general preventive health recommendations were provided to patient.     Andrez Grime, LPN  [id number]

## 2020-01-13 NOTE — Progress Notes (Signed)
PCP notes:  Health Maintenance: No gaps noted   Abnormal Screenings: none   Patient concerns: Lower back pain onset 1 month ago Discuss possibly starting on medication for memory loss   Nurse concerns: none   Next PCP appt.: 01/24/2020 @ 9:30 am

## 2020-01-13 NOTE — Patient Instructions (Signed)
Brooke Mitchell , Thank you for taking time to come for your Medicare Wellness Visit. I appreciate your ongoing commitment to your health goals. Please review the following plan we discussed and let me know if I can assist you in the future.   Screening recommendations/referrals: Colonoscopy: Up to date, completed 01/19/2015 Mammogram: Up to date, completed 07/17/2019 Bone Density: completed 01/02/2017 Recommended yearly ophthalmology/optometry visit for glaucoma screening and checkup Recommended yearly dental visit for hygiene and checkup  Vaccinations: Influenza vaccine: Up to date, completed 05/08/2019 Pneumococcal vaccine: Completed series Tdap vaccine: Up to date, completed 08/23/2016 Shingles vaccine: Completed series    Advanced directives: Please bring a copy of your POA (Power of [place]) and/or Living Will to your next appointment.   Conditions/risks identified: diabetes, hypertension, hyperlipidemia  Next appointment: 01/24/2020 @ 9:30 am    Preventive Care 69 Years and Older, Female Preventive care refers to lifestyle choices and visits with your health care provider that can promote health and wellness. What does preventive care include?  A yearly physical exam. This is also called an annual well check.  Dental exams once or twice a year.  Routine eye exams. Ask your health care provider how often you should have your eyes checked.  Personal lifestyle choices, including:  Daily care of your teeth and gums.  Regular physical activity.  Eating a healthy diet.  Avoiding tobacco and drug use.  Limiting alcohol use.  Practicing safe sex.  Taking low-dose aspirin every day.  Taking vitamin and mineral supplements as recommended by your health care provider. What happens during an annual well check? The services and screenings done by your health care provider during your annual well check will depend on your age, overall health, lifestyle risk factors, and family  history of disease. Counseling  Your health care provider may ask you questions about your:  Alcohol use.  Tobacco use.  Drug use.  Emotional well-being.  Home and relationship well-being.  Sexual activity.  Eating habits.  History of falls.  Memory and ability to understand (cognition).  Work and work Statistician.  Reproductive health. Screening  You may have the following tests or measurements:  Height, weight, and BMI.  Blood pressure.  Lipid and cholesterol levels. These may be checked every 5 years, or more frequently if you are over 65 years old.  Skin check.  Lung cancer screening. You may have this screening every year starting at age 54 if you have a 30-pack-year history of smoking and currently smoke or have quit within the past 15 years.  Fecal occult blood test (FOBT) of the stool. You may have this test every year starting at age 26.  Flexible sigmoidoscopy or colonoscopy. You may have a sigmoidoscopy every 5 years or a colonoscopy every 10 years starting at age 6.  Hepatitis C blood test.  Hepatitis B blood test.  Sexually transmitted disease (STD) testing.  Diabetes screening. This is done by checking your blood sugar (glucose) after you have not eaten for a while (fasting). You may have this done every 1-3 years.  Bone density scan. This is done to screen for osteoporosis. You may have this done starting at age 4.  Mammogram. This may be done every 1-2 years. Talk to your health care provider about how often you should have regular mammograms. Talk with your health care provider about your test results, treatment options, and if necessary, the need for more tests. Vaccines  Your health care provider may recommend certain vaccines, such as:  Influenza vaccine. This is recommended every year.  Tetanus, diphtheria, and acellular pertussis (Tdap, Td) vaccine. You may need a Td booster every 10 years.  Zoster vaccine. You may need this after  age 76.  Pneumococcal 13-valent conjugate (PCV13) vaccine. One dose is recommended after age 1.  Pneumococcal polysaccharide (PPSV23) vaccine. One dose is recommended after age 64. Talk to your health care provider about which screenings and vaccines you need and how often you need them. This information is not intended to replace advice given to you by your health care provider. Make sure you discuss any questions you have with your health care provider. Document Released: 09/18/2015 Document Revised: 05/11/2016 Document Reviewed: 06/23/2015 Elsevier Interactive Patient Education  2017 [place] Prevention in the Home Falls can cause injuries. They can happen to people of all ages. There are many things you can do to make your home safe and to help prevent falls. What can I do on the outside of my home?  Regularly fix the edges of walkways and driveways and fix any cracks.  Remove anything that might make you trip as you walk through a door, such as a raised step or threshold.  Trim any bushes or trees on the path to your home.  Use bright outdoor lighting.  Clear any walking paths of anything that might make someone trip, such as rocks or tools.  Regularly check to see if handrails are loose or broken. Make sure that both sides of any steps have handrails.  Any raised decks and porches should have guardrails on the edges.  Have any leaves, snow, or ice cleared regularly.  Use sand or salt on walking paths during winter.  Clean up any spills in your garage right away. This includes oil or grease spills. What can I do in the bathroom?  Use night lights.  Install grab bars by the toilet and in the tub and shower. Do not use towel bars as grab bars.  Use non-skid mats or decals in the tub or shower.  If you need to sit down in the shower, use a plastic, non-slip stool.  Keep the floor dry. Clean up any water that spills on the floor as soon as it  happens.  Remove soap buildup in the tub or shower regularly.  Attach bath mats securely with double-sided non-slip rug tape.  Do not have throw rugs and other things on the floor that can make you trip. What can I do in the bedroom?  Use night lights.  Make sure that you have a light by your bed that is easy to reach.  Do not use any sheets or blankets that are too big for your bed. They should not hang down onto the floor.  Have a firm chair that has side arms. You can use this for support while you get dressed.  Do not have throw rugs and other things on the floor that can make you trip. What can I do in the kitchen?  Clean up any spills right away.  Avoid walking on wet floors.  Keep items that you use a lot in easy-to-reach places.  If you need to reach something above you, use a strong step stool that has a grab bar.  Keep electrical cords out of the way.  Do not use floor polish or wax that makes floors slippery. If you must use wax, use non-skid floor wax.  Do not have throw rugs and other things on the floor that  can make you trip. What can I do with my stairs?  Do not leave any items on the stairs.  Make sure that there are handrails on both sides of the stairs and use them. Fix handrails that are broken or loose. Make sure that handrails are as long as the stairways.  Check any carpeting to make sure that it is firmly attached to the stairs. Fix any carpet that is loose or worn.  Avoid having throw rugs at the top or bottom of the stairs. If you do have throw rugs, attach them to the floor with carpet tape.  Make sure that you have a light switch at the top of the stairs and the bottom of the stairs. If you do not have them, ask someone to add them for you. What else can I do to help prevent falls?  Wear shoes that:  Do not have high heels.  Have rubber bottoms.  Are comfortable and fit you well.  Are closed at the toe. Do not wear sandals.  If you  use a stepladder:  Make sure that it is fully opened. Do not climb a closed stepladder.  Make sure that both sides of the stepladder are locked into place.  Ask someone to hold it for you, if possible.  Clearly mark and make sure that you can see:  Any grab bars or handrails.  First and last steps.  Where the edge of each step is.  Use tools that help you move around (mobility aids) if they are needed. These include:  Canes.  Walkers.  Scooters.  Crutches.  Turn on the lights when you go into a dark area. Replace any light bulbs as soon as they burn out.  Set up your furniture so you have a clear path. Avoid moving your furniture around.  If any of your floors are uneven, fix them.  If there are any pets around you, be aware of where they are.  Review your medicines with your doctor. Some medicines can make you feel dizzy. This can increase your chance of falling. Ask your doctor what other things that you can do to help prevent falls. This information is not intended to replace advice given to you by your health care provider. Make sure you discuss any questions you have with your health care provider. Document Released: 06/18/2009 Document Revised: 01/28/2016 Document Reviewed: 09/26/2014 Elsevier Interactive Patient Education  2017 [employer].

## 2020-01-20 ENCOUNTER — Encounter: Payer: Medicare Other | Admitting: Family Medicine

## 2020-01-24 ENCOUNTER — Encounter: Payer: Self-pay | Admitting: Family Medicine

## 2020-01-24 ENCOUNTER — Other Ambulatory Visit: Payer: Self-pay

## 2020-01-24 ENCOUNTER — Ambulatory Visit (INDEPENDENT_AMBULATORY_CARE_PROVIDER_SITE_OTHER): Payer: Medicare PPO | Admitting: Family Medicine

## 2020-01-24 VITALS — BP 128/76 | HR 95 | Temp 97.5°F | Ht 64.0 in | Wt 212.3 lb

## 2020-01-24 DIAGNOSIS — E039 Hypothyroidism, unspecified: Secondary | ICD-10-CM

## 2020-01-24 DIAGNOSIS — Z0000 Encounter for general adult medical examination without abnormal findings: Secondary | ICD-10-CM | POA: Diagnosis not present

## 2020-01-24 DIAGNOSIS — I10 Essential (primary) hypertension: Secondary | ICD-10-CM | POA: Diagnosis not present

## 2020-01-24 DIAGNOSIS — E538 Deficiency of other specified B group vitamins: Secondary | ICD-10-CM | POA: Diagnosis not present

## 2020-01-24 DIAGNOSIS — E785 Hyperlipidemia, unspecified: Secondary | ICD-10-CM

## 2020-01-24 DIAGNOSIS — E119 Type 2 diabetes mellitus without complications: Secondary | ICD-10-CM

## 2020-01-24 DIAGNOSIS — Z6836 Body mass index (BMI) 36.0-36.9, adult: Secondary | ICD-10-CM

## 2020-01-24 DIAGNOSIS — D039 Melanoma in situ, unspecified: Secondary | ICD-10-CM

## 2020-01-24 DIAGNOSIS — E1169 Type 2 diabetes mellitus with other specified complication: Secondary | ICD-10-CM

## 2020-01-24 MED ORDER — SITAGLIPTIN PHOSPHATE 100 MG PO TABS: 100.0000 mg | ORAL_TABLET | Freq: Every day | ORAL | 3 refills | Status: DC

## 2020-01-24 MED ORDER — LEVOTHYROXINE SODIUM 50 MCG PO TABS: ORAL_TABLET | ORAL | 3 refills | Status: DC

## 2020-01-24 MED ORDER — LISINOPRIL-HYDROCHLOROTHIAZIDE 10-12.5 MG PO TABS: 1.0000 | ORAL_TABLET | Freq: Every day | ORAL | 3 refills | Status: DC

## 2020-01-24 MED ORDER — FLUTICASONE-SALMETEROL 100-50 MCG/DOSE IN AEPB: INHALATION_SPRAY | RESPIRATORY_TRACT | 3 refills | Status: DC

## 2020-01-24 MED ORDER — ATORVASTATIN CALCIUM 20 MG PO TABS: 10.0000 mg | ORAL_TABLET | Freq: Every day | ORAL | 3 refills | Status: DC

## 2020-01-24 MED ORDER — GLIPIZIDE ER 10 MG PO TB24: 10.0000 mg | ORAL_TABLET | Freq: Every day | ORAL | 3 refills | Status: DC

## 2020-01-24 MED ORDER — METFORMIN HCL 1000 MG PO TABS: ORAL_TABLET | ORAL | 3 refills | Status: DC

## 2020-01-24 NOTE — Progress Notes (Signed)
Subjective:    Patient ID: Brooke Mitchell, female    DOB: 10-19-1950, 69 y.o.   MRN: [id number]  This visit occurred during the SARS-CoV-2 public health emergency.  Safety protocols were in place, including screening questions prior to the visit, additional usage of staff PPE, and extensive cleaning of exam room while observing appropriate contact time as indicated for disinfecting solutions.    HPI Here for health maintenance exam and to review chronic medical problems    Wt Readings from Last 3 Encounters:  01/24/20 212 lb 5 oz (96.3 kg)  12/04/19 212 lb (96.2 kg)  09/01/19 206 lb (93.4 kg)  stable from march/up from December  36.44 kg/m   Had amw on 5/10  No concerns or gaps   covid status- immunized   Eye exam 1/21  Mammogram 11/20  Self breast exam - no lumps or changes   Colonoscopy 5/16 with 10 y recall   dexa 4/18 -in the normal range for bmd  Falls one - no injuries/mild   (tripped on sidewalk /uneven area)  Fractures -none Supplements -ca and D Exercise -walking reguarly -has kept it up [employer] bike   Pt brought adv directive to rev and scan in her chart   HTN bp is stable today  No cp or palpitations or headaches or edema  No side effects to medicines  BP Readings from Last 3 Encounters:  01/24/20 128/76  12/04/19 132/74  09/01/19 (!) 153/72      Hypothyroidism  Pt has no clinical changes No change in energy level/ hair or skin/ edema and no tremor Lab Results  Component Value Date   TSH 3.20 01/08/2020      DM2 Lab Results  Component Value Date   HGBA1C 6.4 01/08/2020  januvia and glipizide and metoformin This is up from 6.2 (fairly stable)  Statin and  Ace   She has had 2 low glucose episodes  Causes tingling in lips (knows how to recognize)  She is aware of her symptoms  Does not  want to cut back on any medication   Eating well -most of the time  If she cheats -sweets    Hyperlipidemia  Lab Results  Component Value Date    CHOL 170 01/08/2020   CHOL 160 07/19/2019   CHOL 131 02/19/2019   Lab Results  Component Value Date   HDL 45.60 01/08/2020   HDL 38.40 (L) 07/19/2019   HDL 39.90 02/19/2019   Lab Results  Component Value Date   LDLCALC 98 01/08/2020   LDLCALC 83 07/19/2019   LDLCALC 51 02/19/2019   Lab Results  Component Value Date   TRIG 133.0 01/08/2020   TRIG 197.0 (H) 07/19/2019   TRIG 200.0 (H) 02/19/2019   Lab Results  Component Value Date   CHOLHDL 4 01/08/2020   CHOLHDL 4 07/19/2019   CHOLHDL 3 02/19/2019   Lab Results  Component Value Date   LDLDIRECT 85.0 05/22/2018   LDLDIRECT 97.0 11/21/2017   LDLDIRECT 84.0 03/27/2017   statin and diet  Diet is good - avoids fatty food   Vit B12 def Lab Results  Component Value Date   VITAMINB12 323 01/08/2020    H/o melanoma in situ  Needs to follow up with derm   Lab Results  Component Value Date   WBC 6.0 01/08/2020   HGB 13.1 01/08/2020   HCT 38.9 01/08/2020   MCV 89.9 01/08/2020   PLT 236.0 01/08/2020    Lab Results  Component Value  Date   CREATININE 0.85 01/08/2020   BUN 20 01/08/2020   NA 141 01/08/2020   K 4.0 01/08/2020   CL 103 01/08/2020   CO2 31 01/08/2020   Lab Results  Component Value Date   ALT 14 01/08/2020   AST 14 01/08/2020   ALKPHOS 42 01/08/2020   BILITOT 0.6 01/08/2020    Has had some back pain-doing stretching exercises   Patient Active Problem List   Diagnosis Date Noted  . Strain of thoracic back region 12/04/2019  . Melanoma in situ ([place]) 01/07/2018  . Welcome to Medicare preventive visit 12/23/2016  . Screening mammogram, encounter for 12/23/2016  . Estrogen deficiency 12/23/2016  . Neck pain 08/30/2016  . Foot pain, left 12/23/2015  . Urge incontinence 12/23/2015  . Anemia, iron deficiency 02/19/2015  . Vitamin B 12 deficiency 12/05/2014  . Fatigue 11/26/2014  . Grief reaction 01/22/2014  . Colon cancer screening 10/24/2012  . Class 2 severe obesity due to excess calories  with serious comorbidity and body mass index (BMI) of 36.0 to 36.9 in adult ([place]) 10/19/2011  . Other screening mammogram 04/13/2011  . Routine general medical examination at a health care facility 04/07/2011  . UTI'S, RECURRENT 02/03/2010  . POSTMENOPAUSAL STATUS 03/28/2008  . Hypothyroidism 11/24/2006  . Diabetes type 2, controlled ([place]) 11/24/2006  . Hyperlipidemia associated with type 2 diabetes mellitus ([place]) 11/24/2006  . RESTLESS LEG SYNDROME 11/24/2006  . Essential hypertension 11/24/2006  . PREMATURE VENTRICULAR CONTRACTIONS 11/24/2006  . HEMORRHOIDS 11/24/2006  . ALLERGIC RHINITIS 11/24/2006  . GERD 11/24/2006  . HIATAL HERNIA 11/24/2006  . OVERACTIVE BLADDER 11/24/2006  . [place] DISEASE, CERVICAL 11/24/2006  . Sleep apnea 11/24/2006   Past Medical History:  Diagnosis Date  . Allergic rhinitis   . Allergy   . Anxiety   . Arthritis    osteoarthritis  . Asthma    As a child   . Depression   . Diabetes mellitus    Type II (2/06 elevated microalbumin)  . Frequent UTI    with coital prohylaxis   . GERD (gastroesophageal reflux disease)   . Hyperlipidemia   . Hypertension   . Hypothyroidism   . IDA (iron deficiency anemia)   . Obesity   . OSA (obstructive sleep apnea)    CPAP  . Sleep apnea    wears c-pap   Past Surgical History:  Procedure Laterality Date  . BELPHAROPTOSIS REPAIR    . COLONOSCOPY    . DILATION AND CURETTAGE OF UTERUS    . ENDOMETRIAL BIOPSY    . FOOT SURGERY    . UPPER GASTROINTESTINAL ENDOSCOPY     Social History   Tobacco Use  . Smoking status: Never Smoker  . Smokeless tobacco: Never Used  Substance Use Topics  . Alcohol use: Yes    Alcohol/week: 0.0 standard drinks    Comment: occasional  . Drug use: No   Family History  Problem Relation Age of Onset  . Diabetes Mother   . Coronary artery disease Mother   . Hypothyroidism Mother   . Irritable bowel syndrome Mother   . Alzheimer's disease Father   . Nephrolithiasis Father    . Hypothyroidism Brother   . Breast cancer Paternal Grandmother 79  . Colon cancer Neg Hx   . Esophageal cancer Neg Hx   . Rectal cancer Neg Hx   . Stomach cancer Neg Hx    No Known Allergies Current Outpatient Medications on File Prior to Visit  Medication Sig Dispense Refill  .  acetaminophen (TYLENOL) 325 MG tablet Take 650 mg by mouth as needed for pain.    Marland Kitchen albuterol (PROAIR HFA) 108 (90 Base) MCG/ACT inhaler USE 2 PUFFS UPTO EVERY 4 HOURS AS NEEDED FOR WHEEZING 8.5 g 5  . Ascorbic Acid (VITAMIN C) 100 MG tablet Take 100 mg by mouth daily.    Marland Kitchen aspirin 81 MG tablet Take 81 mg by mouth daily.      Marland Kitchen atorvastatin (LIPITOR) 20 MG tablet Take 0.5 tablets (10 mg total) by mouth daily. 45 tablet 3  . calcium carbonate (CALCIUM 600) 600 MG TABS Take 600 mg by mouth 2 (two) times daily with a meal.      . Cholecalciferol 4000 UNITS CAPS Take 4,000 Units by mouth daily. Pt takes 2 tablets of 2,000 units per day = 4,000 units a day    . Cyanocobalamin (VITAMIN B-12 PO) Take 6 mcg by mouth daily.    . Fluticasone-Salmeterol (ADVAIR DISKUS) 100-50 MCG/DOSE AEPB USE 1 PUFF TWICE DAILY DURING ALLERGY SEASON 60 each 11  . glipiZIDE (GLUCOTROL XL) 10 MG 24 hr tablet Take 1 tablet (10 mg total) by mouth daily. 90 tablet 3  . hyoscyamine (LEVSIN SL) 0.125 MG SL tablet Place one tablet under tongue once a day as needed. 30 tablet 1  . IRON, FERROUS GLUCONATE, PO Take 1 tablet by mouth 2 (two) times daily.     Marland Kitchen JANUVIA 100 MG tablet TAKE ONE TABLET BY MOUTH DAILY 30 tablet 5  . levothyroxine (SYNTHROID) 50 MCG tablet TAKE ONE TABLET BY MOUTH DAILY 90 tablet 0  . lisinopril-hydrochlorothiazide (ZESTORETIC) 10-12.5 MG tablet Take 1 tablet by mouth daily. 90 tablet 3  . loratadine (CLARITIN) 10 MG tablet Take 10 mg by mouth daily.      . meloxicam (MOBIC) 15 MG tablet TAKE ONE TABLET BY MOUTH DAILY WITH FOOD AS NEEDED 90 tablet 1  . metFORMIN (GLUCOPHAGE) 1000 MG tablet TAKE ONE TABLET BY MOUTH TWICE A  DAY WITH A MEAL 180 tablet 0  . multivitamin (THERAGRAN) per tablet Take 1 tablet by mouth daily.      Glory Rosebush ULTRA test strip USE TO CHECK BLOOD SUGAR TWO TIMES A DAY 100 strip 1   No current facility-administered medications on file prior to visit.     Review of Systems  Constitutional: Negative for activity change, appetite change, fatigue, fever and unexpected weight change.  HENT: Negative for congestion, ear pain, rhinorrhea, sinus pressure and sore throat.   Eyes: Negative for pain, redness and visual disturbance.  Respiratory: Negative for cough, shortness of breath and wheezing.   Cardiovascular: Negative for chest pain and palpitations.  Gastrointestinal: Negative for abdominal pain, blood in stool, constipation and diarrhea.  Endocrine: Negative for polydipsia and polyuria.  Genitourinary: Negative for dysuria, frequency and urgency.  Musculoskeletal: Negative for arthralgias, back pain and myalgias.  Skin: Negative for pallor and rash.  Allergic/Immunologic: Negative for environmental allergies.  Neurological: Negative for dizziness, syncope and headaches.  Hematological: Negative for adenopathy. Does not bruise/bleed easily.  Psychiatric/Behavioral: Negative for decreased concentration and dysphoric mood. The patient is not nervous/anxious.        Objective:   Physical Exam Constitutional:      General: She is not in acute distress.    Appearance: Normal appearance. She is well-developed. She is obese. She is not ill-appearing or diaphoretic.  HENT:     Head: Normocephalic and atraumatic.     Right Ear: Tympanic membrane, ear canal and external ear  normal.     Left Ear: Tympanic membrane, ear canal and external ear normal.     Nose: Nose normal. No congestion.     Mouth/Throat:     Mouth: Mucous membranes are moist.     Pharynx: Oropharynx is clear. No posterior oropharyngeal erythema.  Eyes:     General: No scleral icterus.    Extraocular Movements:  Extraocular movements intact.     Conjunctiva/sclera: Conjunctivae normal.     Pupils: Pupils are equal, round, and reactive to light.  Neck:     Thyroid: No thyromegaly.     Vascular: No carotid bruit or JVD.  Cardiovascular:     Rate and Rhythm: Normal rate and regular rhythm.     Pulses: Normal pulses.     Heart sounds: Normal heart sounds. No gallop.   Pulmonary:     Effort: Pulmonary effort is normal. No respiratory distress.     Breath sounds: Normal breath sounds. No wheezing.     Comments: Good air exch Chest:     Chest wall: No tenderness.  Abdominal:     General: Bowel sounds are normal. There is no distension or abdominal bruit.     Palpations: Abdomen is soft. There is no mass.     Tenderness: There is no abdominal tenderness.     Hernia: No hernia is present.  Genitourinary:    Comments: Breast exam: No mass, nodules, thickening, tenderness, bulging, retraction, inflamation, nipple discharge or skin changes noted.  No axillary or clavicular LA.     Musculoskeletal:        General: No tenderness. Normal range of motion.     Cervical back: Normal range of motion and neck supple. No rigidity. No muscular tenderness.     Right lower leg: No edema.     Left lower leg: No edema.  Lymphadenopathy:     Cervical: No cervical adenopathy.  Skin:    General: Skin is warm and dry.     Coloration: Skin is not pale.     Findings: No erythema or rash.     Comments: Solar lentigines diffusely Some sks   Neurological:     Mental Status: She is alert. Mental status is at baseline.     Cranial Nerves: No cranial nerve deficit.     Motor: No abnormal muscle tone.     Coordination: Coordination normal.     Gait: Gait normal.     Deep Tendon Reflexes: Reflexes are normal and symmetric. Reflexes normal.  Psychiatric:        Mood and Affect: Mood normal.        Cognition and Memory: Cognition and memory normal.           Assessment & Plan:   Problem List Items Addressed  This Visit      Cardiovascular and Mediastinum   Essential hypertension    bp in fair control at this time  BP Readings from Last 1 Encounters:  01/24/20 128/76   No changes needed Most recent labs reviewed  Disc lifstyle change with low sodium diet and exercise        Relevant Medications   atorvastatin (LIPITOR) 20 MG tablet   lisinopril-hydrochlorothiazide (ZESTORETIC) 10-12.5 MG tablet   Other Relevant Orders   Comprehensive metabolic panel     Endocrine   Hypothyroidism    Hypothyroidism  Pt has no clinical changes No change in energy level/ hair or skin/ edema and no tremor Lab Results  Component Value Date  TSH 3.20 01/08/2020          Relevant Medications   levothyroxine (SYNTHROID) 50 MCG tablet   Diabetes type 2, controlled ([place])    Lab Results  Component Value Date   HGBA1C 6.4 01/08/2020   Stable  Continues glipizide and metformin and januvia Will watch for hypoglycemia  Work on wt loss  On statin and ace Eye exam utd      Relevant Medications   atorvastatin (LIPITOR) 20 MG tablet   glipiZIDE (GLUCOTROL XL) 10 MG 24 hr tablet   lisinopril-hydrochlorothiazide (ZESTORETIC) 10-12.5 MG tablet   metFORMIN (GLUCOPHAGE) 1000 MG tablet   sitaGLIPtin (JANUVIA) 100 MG tablet   Other Relevant Orders   Hemoglobin A1c   Hyperlipidemia associated with type 2 diabetes mellitus ([place])    Disc goals for lipids and reasons to control them Rev last labs with pt Rev low sat fat diet in detail  Fair control with statin and diet  LDL 98 (prefer below 70) Will continue to work on diet       Relevant Medications   atorvastatin (LIPITOR) 20 MG tablet   glipiZIDE (GLUCOTROL XL) 10 MG 24 hr tablet   lisinopril-hydrochlorothiazide (ZESTORETIC) 10-12.5 MG tablet   metFORMIN (GLUCOPHAGE) 1000 MG tablet   sitaGLIPtin (JANUVIA) 100 MG tablet   Other Relevant Orders   Comprehensive metabolic panel   Lipid panel     Other   Routine general medical examination at a  health care facility - Primary    Reviewed health habits including diet and exercise and skin cancer prevention Reviewed appropriate screening tests for age  Also reviewed health mt list, fam hx and immunization status , as well as social and family history   See HPI Labs reviewed Planning derm screening visit  Immunized for covid  Also had shingrix       Class 2 severe obesity due to excess calories with serious comorbidity and body mass index (BMI) of 36.0 to 36.9 in adult [facility])    Discussed how this problem influences overall health and the risks it imposes  Reviewed plan for weight loss with lower calorie diet (via better food choices and also portion control or program like weight watchers) and exercise building up to or more than 30 minutes 5 days per week including some aerobic activity         Relevant Medications   glipiZIDE (GLUCOTROL XL) 10 MG 24 hr tablet   metFORMIN (GLUCOPHAGE) 1000 MG tablet   sitaGLIPtin (JANUVIA) 100 MG tablet   Vitamin B 12 deficiency    Lab Results  Component Value Date   VITAMINB12 323 01/08/2020   Continues current supplementation       Melanoma in situ [facility])    Planning visit to derm for screening  Pt is good about sun protection

## 2020-01-24 NOTE — Patient Instructions (Signed)
Keep exercising   Keep eating a diabetic diet and work on weight loss   Get a derm visit  Wear sunscreen   Follow up in about 6 months

## 2020-01-26 NOTE — Assessment & Plan Note (Signed)
Reviewed health habits including diet and exercise and skin cancer prevention Reviewed appropriate screening tests for age  Also reviewed health mt list, fam hx and immunization status , as well as social and family history   See HPI Labs reviewed Planning derm screening visit  Immunized for covid  Also had shingrix

## 2020-01-26 NOTE — Assessment & Plan Note (Signed)
Disc goals for lipids and reasons to control them Rev last labs with pt Rev low sat fat diet in detail  Fair control with statin and diet  LDL 98 (prefer below 70) Will continue to work on diet

## 2020-01-26 NOTE — Assessment & Plan Note (Signed)
Planning visit to derm for screening  Pt is good about sun protection

## 2020-01-26 NOTE — Assessment & Plan Note (Signed)
Lab Results  Component Value Date   HGBA1C 6.4 01/08/2020   Stable  Continues glipizide and metformin and [place] Will watch for hypoglycemia  Work on wt loss  On statin and ace Eye exam utd

## 2020-01-26 NOTE — Assessment & Plan Note (Signed)
Hypothyroidism  Pt has no clinical changes No change in energy level/ hair or skin/ edema and no tremor Lab Results  Component Value Date   TSH 3.20 01/08/2020

## 2020-01-26 NOTE — Assessment & Plan Note (Signed)
Lab Results  Component Value Date   [id number] 01/08/2020   Continues current supplementation

## 2020-01-26 NOTE — Assessment & Plan Note (Signed)
bp in fair control at this time  BP Readings from Last 1 Encounters:  01/24/20 128/76   No changes needed Most recent labs reviewed  Disc lifstyle change with low sodium diet and exercise

## 2020-01-26 NOTE — Assessment & Plan Note (Signed)
Discussed how this problem influences overall health and the risks it imposes  Reviewed plan for weight loss with lower calorie diet (via better food choices and also portion control or program like weight watchers) and exercise building up to or more than 30 minutes 5 days per week including some aerobic activity    

## 2020-01-30 ENCOUNTER — Telehealth: Payer: Self-pay

## 2020-01-30 MED ORDER — ACCU-CHEK GUIDE VI STRP: ORAL_STRIP | 5 refills | Status: DC

## 2020-01-30 NOTE — Telephone Encounter (Signed)
Pt called triage asking for a rx for Accu-Chek Guide Test Strips be sent to [facility].  Rx sent electronically.

## 2020-03-16 ENCOUNTER — Encounter: Payer: Self-pay | Admitting: Family Medicine

## 2020-03-16 MED ORDER — ACCU-CHEK GUIDE VI STRP: ORAL_STRIP | 5 refills | Status: DC

## 2020-04-02 ENCOUNTER — Other Ambulatory Visit: Payer: Self-pay | Admitting: Family Medicine

## 2020-04-15 DIAGNOSIS — E119 Type 2 diabetes mellitus without complications: Secondary | ICD-10-CM | POA: Diagnosis not present

## 2020-04-15 DIAGNOSIS — H02839 Dermatochalasis of unspecified eye, unspecified eyelid: Secondary | ICD-10-CM | POA: Diagnosis not present

## 2020-04-15 DIAGNOSIS — H04129 Dry eye syndrome of unspecified lacrimal gland: Secondary | ICD-10-CM | POA: Diagnosis not present

## 2020-04-15 DIAGNOSIS — H2513 Age-related nuclear cataract, bilateral: Secondary | ICD-10-CM | POA: Diagnosis not present

## 2020-04-15 DIAGNOSIS — H40013 Open angle with borderline findings, low risk, bilateral: Secondary | ICD-10-CM | POA: Diagnosis not present

## 2020-04-15 LAB — HM DIABETES EYE EXAM

## 2020-04-25 ENCOUNTER — Other Ambulatory Visit: Payer: Self-pay | Admitting: Family Medicine

## 2020-04-27 NOTE — Telephone Encounter (Signed)
CPE was on 01/24/20, and f/u appt scheduled 07/23/20, last filled on 10/25/19 #90 tabs with 1 refill

## 2020-04-30 DIAGNOSIS — G4733 Obstructive sleep apnea (adult) (pediatric): Secondary | ICD-10-CM | POA: Diagnosis not present

## 2020-05-01 ENCOUNTER — Encounter: Payer: Self-pay | Admitting: Family Medicine

## 2020-05-11 ENCOUNTER — Encounter: Payer: Self-pay | Admitting: Family Medicine

## 2020-05-30 ENCOUNTER — Other Ambulatory Visit: Payer: Self-pay

## 2020-05-30 ENCOUNTER — Ambulatory Visit (INDEPENDENT_AMBULATORY_CARE_PROVIDER_SITE_OTHER): Payer: Medicare PPO

## 2020-05-30 DIAGNOSIS — Z23 Encounter for immunization: Secondary | ICD-10-CM

## 2020-06-15 ENCOUNTER — Other Ambulatory Visit: Payer: Self-pay | Admitting: Family Medicine

## 2020-06-15 DIAGNOSIS — Z1231 Encounter for screening mammogram for malignant neoplasm of breast: Secondary | ICD-10-CM

## 2020-07-16 ENCOUNTER — Other Ambulatory Visit (INDEPENDENT_AMBULATORY_CARE_PROVIDER_SITE_OTHER): Payer: Medicare PPO

## 2020-07-16 ENCOUNTER — Other Ambulatory Visit: Payer: Self-pay

## 2020-07-16 DIAGNOSIS — E785 Hyperlipidemia, unspecified: Secondary | ICD-10-CM | POA: Diagnosis not present

## 2020-07-16 DIAGNOSIS — I10 Essential (primary) hypertension: Secondary | ICD-10-CM | POA: Diagnosis not present

## 2020-07-16 DIAGNOSIS — E119 Type 2 diabetes mellitus without complications: Secondary | ICD-10-CM | POA: Diagnosis not present

## 2020-07-16 DIAGNOSIS — E1169 Type 2 diabetes mellitus with other specified complication: Secondary | ICD-10-CM

## 2020-07-16 LAB — LIPID PANEL
Cholesterol: 162 mg/dL (ref 0–200)
HDL: 47.4 mg/dL (ref >39.00)
LDL Cholesterol: 90 mg/dL (ref 0–99)
NonHDL: 114.47
Total CHOL/HDL Ratio: 3
Triglycerides: 121 mg/dL (ref 0.0–149.0)
VLDL: 24.2 mg/dL (ref 0.0–40.0)

## 2020-07-16 LAB — HEMOGLOBIN A1C: Hgb A1c MFr Bld: 6.4 % (ref 4.6–6.5)

## 2020-07-16 LAB — COMPREHENSIVE METABOLIC PANEL
ALT: 13 U/L (ref 0–35)
AST: 15 U/L (ref 0–37)
Albumin: 4.1 g/dL (ref 3.5–5.2)
Alkaline Phosphatase: 41 U/L (ref 39–117)
BUN: 23 mg/dL (ref 6–23)
CO2: 30 mEq/L (ref 19–32)
Calcium: 9.2 mg/dL (ref 8.4–10.5)
Chloride: 102 mEq/L (ref 96–112)
Creatinine, Ser: 1.01 mg/dL (ref 0.40–1.20)
GFR: 56.82 mL/min — ABNORMAL LOW (ref >60.00)
Glucose, Bld: 147 mg/dL — ABNORMAL HIGH (ref 70–99)
Potassium: 4.1 mEq/L (ref 3.5–5.1)
Sodium: 140 mEq/L (ref 135–145)
Total Bilirubin: 0.5 mg/dL (ref 0.2–1.2)
Total Protein: 6.5 g/dL (ref 6.0–8.3)

## 2020-07-17 ENCOUNTER — Other Ambulatory Visit: Payer: Self-pay

## 2020-07-17 ENCOUNTER — Ambulatory Visit
Admission: RE | Admit: 2020-07-17 | Discharge: 2020-07-17 | Disposition: A | Discharge status: Home / Self Care | Payer: Medicare PPO | Source: Ambulatory Visit | Attending: Family Medicine | Admitting: Family Medicine

## 2020-07-17 DIAGNOSIS — Z1231 Encounter for screening mammogram for malignant neoplasm of breast: Secondary | ICD-10-CM | POA: Diagnosis not present

## 2020-07-21 ENCOUNTER — Ambulatory Visit: Payer: Medicare PPO | Admitting: Family Medicine

## 2020-07-21 ENCOUNTER — Encounter: Payer: Self-pay | Admitting: Family Medicine

## 2020-07-21 ENCOUNTER — Other Ambulatory Visit: Payer: Self-pay

## 2020-07-21 VITALS — BP 135/80 | HR 89 | Temp 96.9°F | Ht 64.0 in | Wt 205.5 lb

## 2020-07-21 DIAGNOSIS — E1169 Type 2 diabetes mellitus with other specified complication: Secondary | ICD-10-CM | POA: Diagnosis not present

## 2020-07-21 DIAGNOSIS — I10 Essential (primary) hypertension: Secondary | ICD-10-CM

## 2020-07-21 DIAGNOSIS — E119 Type 2 diabetes mellitus without complications: Secondary | ICD-10-CM

## 2020-07-21 DIAGNOSIS — Z6835 Body mass index (BMI) 35.0-35.9, adult: Secondary | ICD-10-CM

## 2020-07-21 DIAGNOSIS — E785 Hyperlipidemia, unspecified: Secondary | ICD-10-CM | POA: Diagnosis not present

## 2020-07-21 DIAGNOSIS — L989 Disorder of the skin and subcutaneous tissue, unspecified: Secondary | ICD-10-CM | POA: Diagnosis not present

## 2020-07-21 NOTE — Progress Notes (Signed)
Subjective:    Patient ID: Brooke Mitchell, female    DOB: 1951-02-25, 69 y.o.   MRN: [id number]  This visit occurred during the SARS-CoV-2 public health emergency.  Safety protocols were in place, including screening questions prior to the visit, additional usage of staff PPE, and extensive cleaning of exam room while observing appropriate contact time as indicated for disinfecting solutions.    HPI Pt presents for f/u of chronic health problems   Wt Readings from Last 3 Encounters:  07/21/20 205 lb 8 oz (93.2 kg)  01/24/20 212 lb 5 oz (96.3 kg)  12/04/19 212 lb (96.2 kg)   35.27 kg/m  Loosing weight -working on it  Exercising and trying to eat better   Has been feeling ok  Had covid booster- did great with it pfizer    HTN bp is stable today  No cp or palpitations or headaches or edema  No side effects to medicines  BP Readings from Last 3 Encounters:  07/21/20 (!) 146/82  01/24/20 128/76  12/04/19 132/74   taking lisinopril-ht 10-12.5 mg daily  Re check BP: 135/80   Lab Results  Component Value Date   CREATININE 1.01 07/16/2020   BUN 23 07/16/2020   NA 140 07/16/2020   K 4.1 07/16/2020   CL 102 07/16/2020   CO2 30 07/16/2020    Pulse Readings from Last 3 Encounters:  07/21/20 89  01/24/20 95  12/04/19 88     DM2 Lab Results  Component Value Date   HGBA1C 6.4 07/16/2020   Stable  Glucose that day was 140s - unusual for her  Am glucose does fluctuate   (as a rule does not eat after dinner)  Taking glipizide xl 10 mg daily and metformin 1000 mg bid and Januvia 100 mg daily  Eye exam 8/21 On ace and statin  No foot problems   Hyperlipidemia  Lab Results  Component Value Date   CHOL 162 07/16/2020   CHOL 170 01/08/2020   CHOL 160 07/19/2019   Lab Results  Component Value Date   HDL 47.40 07/16/2020   HDL 45.60 01/08/2020   HDL 38.40 (L) 07/19/2019   Lab Results  Component Value Date   LDLCALC 90 07/16/2020   LDLCALC 98 01/08/2020    LDLCALC 83 07/19/2019   Lab Results  Component Value Date   TRIG 121.0 07/16/2020   TRIG 133.0 01/08/2020   TRIG 197.0 (H) 07/19/2019   Lab Results  Component Value Date   CHOLHDL 3 07/16/2020   CHOLHDL 4 01/08/2020   CHOLHDL 4 07/19/2019   Lab Results  Component Value Date   LDLDIRECT 85.0 05/22/2018   LDLDIRECT 97.0 11/21/2017   LDLDIRECT 84.0 03/27/2017   Atorvastatin 10 mg daily   Had hair cut last week  Hair is thinning on top  Hair dresser recommended biotin   Patient Active Problem List   Diagnosis Date Noted  . Skin lesion 07/21/2020  . Strain of thoracic back region 12/04/2019  . Melanoma in situ ([place]) 01/07/2018  . Welcome to Medicare preventive visit 12/23/2016  . Screening mammogram, encounter for 12/23/2016  . Estrogen deficiency 12/23/2016  . Neck pain 08/30/2016  . Foot pain, left 12/23/2015  . Urge incontinence 12/23/2015  . Anemia, iron deficiency 02/19/2015  . Vitamin B 12 deficiency 12/05/2014  . Fatigue 11/26/2014  . Grief reaction 01/22/2014  . Colon cancer screening 10/24/2012  . Severe obesity (BMI 35.0-35.9 with comorbidity) ([place]) 10/19/2011  . Other screening mammogram 04/13/2011  .  Routine general medical examination at a health care facility 04/07/2011  . UTI'S, RECURRENT 02/03/2010  . POSTMENOPAUSAL STATUS 03/28/2008  . Hypothyroidism 11/24/2006  . Diabetes type 2, controlled ([place]) 11/24/2006  . Hyperlipidemia associated with type 2 diabetes mellitus ([place]) 11/24/2006  . RESTLESS LEG SYNDROME 11/24/2006  . Essential hypertension 11/24/2006  . PREMATURE VENTRICULAR CONTRACTIONS 11/24/2006  . HEMORRHOIDS 11/24/2006  . ALLERGIC RHINITIS 11/24/2006  . GERD 11/24/2006  . HIATAL HERNIA 11/24/2006  . OVERACTIVE BLADDER 11/24/2006  . [place] DISEASE, CERVICAL 11/24/2006  . Sleep apnea 11/24/2006   Past Medical History:  Diagnosis Date  . Allergic rhinitis   . Allergy   . Anxiety   . Arthritis    osteoarthritis  . Asthma    As a  child   . Depression   . Diabetes mellitus    Type II (2/06 elevated microalbumin)  . Frequent UTI    with coital prohylaxis   . GERD (gastroesophageal reflux disease)   . Hyperlipidemia   . Hypertension   . Hypothyroidism   . IDA (iron deficiency anemia)   . Obesity   . OSA (obstructive sleep apnea)    CPAP  . Sleep apnea    wears c-pap   Past Surgical History:  Procedure Laterality Date  . BELPHAROPTOSIS REPAIR    . COLONOSCOPY    . DILATION AND CURETTAGE OF UTERUS    . ENDOMETRIAL BIOPSY    . FOOT SURGERY    . UPPER GASTROINTESTINAL ENDOSCOPY     Social History   Tobacco Use  . Smoking status: Never Smoker  . Smokeless tobacco: Never Used  Vaping Use  . Vaping Use: Never used  Substance Use Topics  . Alcohol use: Yes    Alcohol/week: 0.0 standard drinks    Comment: occasional  . Drug use: No   Family History  Problem Relation Age of Onset  . Diabetes Mother   . Coronary artery disease Mother   . Hypothyroidism Mother   . Irritable bowel syndrome Mother   . Alzheimer's disease Father   . Nephrolithiasis Father   . Hypothyroidism Brother   . Breast cancer Paternal Grandmother 36  . Colon cancer Neg Hx   . Esophageal cancer Neg Hx   . Rectal cancer Neg Hx   . Stomach cancer Neg Hx    No Known Allergies Current Outpatient Medications on File Prior to Visit  Medication Sig Dispense Refill  . acetaminophen (TYLENOL) 325 MG tablet Take 650 mg by mouth as needed for pain.    Marland Kitchen albuterol (PROAIR HFA) 108 (90 Base) MCG/ACT inhaler INHALE TWO PUFFS BY MOUTH UP TO EVERY 4 HOURS AS NEEDED FOR WHEEZING 8.5 g 5  . Ascorbic Acid (VITAMIN C) 100 MG tablet Take 100 mg by mouth daily.    Marland Kitchen aspirin 81 MG tablet Take 81 mg by mouth daily.      Marland Kitchen atorvastatin (LIPITOR) 20 MG tablet Take 0.5 tablets (10 mg total) by mouth daily. 45 tablet 3  . calcium carbonate (CALCIUM 600) 600 MG TABS Take 600 mg by mouth 2 (two) times daily with a meal.      . Cholecalciferol 4000 UNITS  CAPS Take 4,000 Units by mouth daily. Pt takes 2 tablets of 2,000 units per day = 4,000 units a day    . Cyanocobalamin (VITAMIN B-12 PO) Take 6 mcg by mouth daily.    . Fluticasone-Salmeterol (ADVAIR DISKUS) 100-50 MCG/DOSE AEPB USE 1 PUFF TWICE DAILY DURING ALLERGY SEASON 180 each 3  .  glipiZIDE (GLUCOTROL XL) 10 MG 24 hr tablet Take 1 tablet (10 mg total) by mouth daily. 90 tablet 3  . glucose blood (ACCU-CHEK GUIDE) test strip Use to check blood sugar twice daily as needed . Dx Code E11.9 100 each 5  . hyoscyamine (LEVSIN SL) 0.125 MG SL tablet Place one tablet under tongue once a day as needed. 30 tablet 1  . IRON, FERROUS GLUCONATE, PO Take 1 tablet by mouth 2 (two) times daily.     Marland Kitchen levothyroxine (SYNTHROID) 50 MCG tablet TAKE ONE TABLET BY MOUTH DAILY 90 tablet 3  . lisinopril-hydrochlorothiazide (ZESTORETIC) 10-12.5 MG tablet Take 1 tablet by mouth daily. 90 tablet 3  . loratadine (CLARITIN) 10 MG tablet Take 10 mg by mouth daily.      . meloxicam (MOBIC) 15 MG tablet TAKE ONE TABLET BY MOUTH DAILY WITH FOOD AS NEEDED 90 tablet 1  . metFORMIN (GLUCOPHAGE) 1000 MG tablet TAKE ONE TABLET BY MOUTH TWICE A DAY WITH A MEAL 180 tablet 3  . multivitamin (THERAGRAN) per tablet Take 1 tablet by mouth daily.      . sitaGLIPtin (JANUVIA) 100 MG tablet Take 1 tablet (100 mg total) by mouth daily. 90 tablet 3   No current facility-administered medications on file prior to visit.     Review of Systems  Constitutional: Negative for activity change, appetite change, fatigue, fever and unexpected weight change.  HENT: Negative for congestion, ear pain, rhinorrhea, sinus pressure and sore throat.   Eyes: Negative for pain, redness and visual disturbance.  Respiratory: Negative for cough, shortness of breath and wheezing.   Cardiovascular: Negative for chest pain and palpitations.  Gastrointestinal: Negative for abdominal pain, blood in stool, constipation and diarrhea.  Endocrine: Negative for  polydipsia and polyuria.       Hair is thinning on top  Genitourinary: Negative for dysuria, frequency and urgency.  Musculoskeletal: Negative for arthralgias, back pain and myalgias.  Skin: Negative for pallor and rash.  Allergic/Immunologic: Negative for environmental allergies.  Neurological: Negative for dizziness, syncope and headaches.  Hematological: Negative for adenopathy. Does not bruise/bleed easily.  Psychiatric/Behavioral: Negative for decreased concentration and dysphoric mood. The patient is not nervous/anxious.        Objective:   Physical Exam Constitutional:      General: She is not in acute distress.    Appearance: She is well-developed.  HENT:     Head: Normocephalic and atraumatic.  Eyes:     Conjunctiva/sclera: Conjunctivae normal.     Pupils: Pupils are equal, round, and reactive to light.  Neck:     Thyroid: No thyromegaly.     Vascular: No carotid bruit or JVD.  Cardiovascular:     Rate and Rhythm: Normal rate and regular rhythm.     Heart sounds: Normal heart sounds. No gallop.   Pulmonary:     Effort: Pulmonary effort is normal. No respiratory distress.     Breath sounds: Normal breath sounds. No wheezing or rales.  Abdominal:     General: Bowel sounds are normal. There is no distension or abdominal bruit.     Palpations: Abdomen is soft. There is no mass.     Tenderness: There is no abdominal tenderness.  Musculoskeletal:     Cervical back: Normal range of motion and neck supple.     Right lower leg: No edema.     Left lower leg: No edema.  Lymphadenopathy:     Cervical: No cervical adenopathy.  Skin:    General:  Skin is warm and dry.     Findings: No rash.     Comments: Keratotic 3 mm lesion on mid chest that is partially debrided   Neurological:     Mental Status: She is alert.     Coordination: Coordination normal.     Deep Tendon Reflexes: Reflexes are normal and symmetric. Reflexes normal.  Psychiatric:        Mood and Affect: Mood  normal.           Assessment & Plan:   Problem List Items Addressed This Visit      Cardiovascular and Mediastinum   Essential hypertension    bp in fair control at this time  BP Readings from Last 1 Encounters:  07/21/20 135/80   No changes needed Most recent labs reviewed  Disc lifstyle change with low sodium diet and exercise  Continues lisinopril 10-12.5 once daily        Endocrine   Diabetes type 2, controlled ([place]) - Primary    Lab Results  Component Value Date   HGBA1C 6.4 07/16/2020   Plan to continue glipizide 20 mg xl daily , januvia 100 mg daily and metformin 1000 mg bid  On ace and statin  Eye exam utd       Hyperlipidemia associated with type 2 diabetes mellitus ([place])    Disc goals for lipids and reasons to control them Rev last labs with pt Rev low sat fat diet in detail LDL of 90  Watching diet more  Continues atorvastatin 10 mg daily          Musculoskeletal and Integument   Skin lesion    Keratotic lesion mid chest with some peeling/breakdown  Per pt not healing  Suspicious for possible squamous cell lesion  Ref to dermatology made       Relevant Orders   Ambulatory referral to Dermatology     Other   Severe obesity (BMI 35.0-35.9 with comorbidity) ([place])    Discussed how this problem influences overall health and the risks it imposes  Reviewed plan for weight loss with lower calorie diet (via better food choices and also portion control or program like weight watchers) and exercise building up to or more than 30 minutes 5 days per week including some aerobic activity   Commended on wt loss so far

## 2020-07-21 NOTE — Assessment & Plan Note (Signed)
bp in fair control at this time  BP Readings from Last 1 Encounters:  07/21/20 135/80   No changes needed Most recent labs reviewed  Disc lifstyle change with low sodium diet and exercise  Continues lisinopril 10-12.5 once daily

## 2020-07-21 NOTE — Patient Instructions (Addendum)
Keep working on weight loss  Be mindful of diet and keep up the exercise   No change in medications I placed a dermatology referral for the spot on your chest  The office will call    If you try biotin for hair we will keep that in mind for thyroid tests   Follow up for annual mammogram in 6 months

## 2020-07-21 NOTE — Assessment & Plan Note (Signed)
Lab Results  Component Value Date   HGBA1C 6.4 07/16/2020   Plan to continue glipizide 20 mg xl daily , januvia 100 mg daily and metformin 1000 mg bid  On ace and statin  Eye exam utd

## 2020-07-21 NOTE — Assessment & Plan Note (Signed)
Disc goals for lipids and reasons to control them Rev last labs with pt Rev low sat fat diet in detail LDL of 90  Watching diet more  Continues atorvastatin 10 mg daily

## 2020-07-21 NOTE — Assessment & Plan Note (Signed)
Discussed how this problem influences overall health and the risks it imposes  Reviewed plan for weight loss with lower calorie diet (via better food choices and also portion control or program like weight watchers) and exercise building up to or more than 30 minutes 5 days per week including some aerobic activity   Commended on wt loss so far  

## 2020-07-21 NOTE — Assessment & Plan Note (Signed)
Keratotic lesion mid chest with some peeling/breakdown  Per pt not healing  Suspicious for possible squamous cell lesion  Ref to dermatology made

## 2020-07-23 ENCOUNTER — Ambulatory Visit: Payer: Medicare PPO | Admitting: Family Medicine

## 2020-08-03 DIAGNOSIS — G4733 Obstructive sleep apnea (adult) (pediatric): Secondary | ICD-10-CM | POA: Diagnosis not present

## 2020-10-06 ENCOUNTER — Encounter: Payer: Self-pay | Admitting: Family Medicine

## 2020-10-08 ENCOUNTER — Ambulatory Visit: Payer: Medicare PPO | Admitting: Family Medicine

## 2020-10-08 ENCOUNTER — Other Ambulatory Visit: Payer: Self-pay

## 2020-10-08 ENCOUNTER — Encounter: Payer: Self-pay | Admitting: Family Medicine

## 2020-10-08 VITALS — BP 122/68 | HR 85 | Temp 96.9°F | Ht 64.0 in | Wt 207.5 lb

## 2020-10-08 DIAGNOSIS — E039 Hypothyroidism, unspecified: Secondary | ICD-10-CM | POA: Diagnosis not present

## 2020-10-08 DIAGNOSIS — R209 Unspecified disturbances of skin sensation: Secondary | ICD-10-CM | POA: Diagnosis not present

## 2020-10-08 LAB — CBC WITH DIFFERENTIAL/PLATELET
Basophils Absolute: 0 10*3/uL (ref 0.0–0.1)
Basophils Relative: 0.4 % (ref 0.0–3.0)
Eosinophils Absolute: 0.1 10*3/uL (ref 0.0–0.7)
Eosinophils Relative: 1.9 % (ref 0.0–5.0)
HCT: 39.4 % (ref 36.0–46.0)
Hemoglobin: 13.4 g/dL (ref 12.0–15.0)
Lymphocytes Relative: 23.8 % (ref 12.0–46.0)
Lymphs Abs: 1.7 10*3/uL (ref 0.7–4.0)
MCHC: 34.1 g/dL (ref 30.0–36.0)
MCV: 89.1 fl (ref 78.0–100.0)
Monocytes Absolute: 0.5 10*3/uL (ref 0.1–1.0)
Monocytes Relative: 6.5 % (ref 3.0–12.0)
Neutro Abs: 4.8 10*3/uL (ref 1.4–7.7)
Neutrophils Relative %: 67.4 % (ref 43.0–77.0)
Platelets: 262 10*3/uL (ref 150.0–400.0)
RBC: 4.42 Mil/uL (ref 3.87–5.11)
RDW: 12.3 % (ref 11.5–15.5)
WBC: 7.2 10*3/uL (ref 4.0–10.5)

## 2020-10-08 LAB — TSH: TSH: 4.27 u[IU]/mL (ref 0.35–4.50)

## 2020-10-08 NOTE — Patient Instructions (Signed)
Keep hands and feet warm  Socks and gloves  Prepare for cold weather and avoid getting overly chilled  Labs today  If TSH is off we may have to stop biotin

## 2020-10-08 NOTE — Progress Notes (Signed)
Subjective:    Patient ID: Brooke Mitchell, female    DOB: [birth year], 70 y.o.   MRN: [id number]  This visit occurred during the SARS-CoV-2 public health emergency.  Safety protocols were in place, including screening questions prior to the visit, additional usage of staff PPE, and extensive cleaning of exam room while observing appropriate contact time as indicated for disinfecting solutions.    HPI Pt presents with complaint of very cold hands and feet   Wt Readings from Last 3 Encounters:  10/08/20 207 lb 8 oz (94.1 kg)  07/21/20 205 lb 8 oz (93.2 kg)  01/24/20 212 lb 5 oz (96.3 kg)   35.62 kg/m   Started approx 2 wk ago since weather became colder  Wearing thick socks  Also uses heating pad  No coldness in body otherwise   This is new  No color change     Wonders about her hypothyroidism Lab Results  Component Value Date   TSH 3.20 01/08/2020     Takes levothyroxine 50 mcg daily  Also biotin -taking twice daily for thinning hair  Fairly high dose -knows it can affect thyroid     DM2 Lab Results  Component Value Date   HGBA1C 6.4 07/16/2020   Takes glipizide xl 10 mg daily  Metformin 1000 mg bid januvia 100 mg daily   Glucose readings have been pretty stable overall    Lab Results  Component Value Date   CREATININE 1.01 07/16/2020   BUN 23 07/16/2020   NA 140 07/16/2020   K 4.1 07/16/2020   CL 102 07/16/2020   CO2 30 07/16/2020    Blood pressure BP Readings from Last 3 Encounters:  10/08/20 122/68  07/21/20 135/80  01/24/20 128/76   Patient Active Problem List   Diagnosis Date Noted  . Cold hands and feet 10/08/2020  . Skin lesion 07/21/2020  . Strain of thoracic back region 12/04/2019  . Melanoma in situ ([place]) 01/07/2018  . Welcome to Medicare preventive visit 12/23/2016  . Screening mammogram, encounter for 12/23/2016  . Estrogen deficiency 12/23/2016  . Neck pain 08/30/2016  . Foot pain, left 12/23/2015  . Urge incontinence  12/23/2015  . Anemia, iron deficiency 02/19/2015  . Vitamin B 12 deficiency 12/05/2014  . Fatigue 11/26/2014  . Grief reaction 01/22/2014  . Colon cancer screening 10/24/2012  . Severe obesity (BMI 35.0-35.9 with comorbidity) ([place]) 10/19/2011  . Other screening mammogram 04/13/2011  . Routine general medical examination at a health care facility 04/07/2011  . UTI'S, RECURRENT 02/03/2010  . POSTMENOPAUSAL STATUS 03/28/2008  . Hypothyroidism 11/24/2006  . Diabetes type 2, controlled ([place]) 11/24/2006  . Hyperlipidemia associated with type 2 diabetes mellitus ([place]) 11/24/2006  . RESTLESS LEG SYNDROME 11/24/2006  . Essential hypertension 11/24/2006  . PREMATURE VENTRICULAR CONTRACTIONS 11/24/2006  . HEMORRHOIDS 11/24/2006  . ALLERGIC RHINITIS 11/24/2006  . GERD 11/24/2006  . HIATAL HERNIA 11/24/2006  . OVERACTIVE BLADDER 11/24/2006  . [place] DISEASE, CERVICAL 11/24/2006  . Sleep apnea 11/24/2006   Past Medical History:  Diagnosis Date  . Allergic rhinitis   . Allergy   . Anxiety   . Arthritis    osteoarthritis  . Asthma    As a child   . Depression   . Diabetes mellitus    Type II (2/06 elevated microalbumin)  . Frequent UTI    with coital prohylaxis   . GERD (gastroesophageal reflux disease)   . Hyperlipidemia   . Hypertension   . Hypothyroidism   . IDA (  iron deficiency anemia)   . Obesity   . OSA (obstructive sleep apnea)    CPAP  . Sleep apnea    wears c-pap   Past Surgical History:  Procedure Laterality Date  . BELPHAROPTOSIS REPAIR    . COLONOSCOPY    . DILATION AND CURETTAGE OF UTERUS    . ENDOMETRIAL BIOPSY    . FOOT SURGERY    . UPPER GASTROINTESTINAL ENDOSCOPY     Social History   Tobacco Use  . Smoking status: Never Smoker  . Smokeless tobacco: Never Used  Vaping Use  . Vaping Use: Never used  Substance Use Topics  . Alcohol use: Yes    Alcohol/week: 0.0 standard drinks    Comment: occasional  . Drug use: No   Family History  Problem  Relation Age of Onset  . Diabetes Mother   . Coronary artery disease Mother   . Hypothyroidism Mother   . Irritable bowel syndrome Mother   . Alzheimer's disease Father   . Nephrolithiasis Father   . Hypothyroidism Brother   . Breast cancer Paternal Grandmother 51  . Colon cancer Neg Hx   . Esophageal cancer Neg Hx   . Rectal cancer Neg Hx   . Stomach cancer Neg Hx    No Known Allergies Current Outpatient Medications on File Prior to Visit  Medication Sig Dispense Refill  . acetaminophen (TYLENOL) 325 MG tablet Take 650 mg by mouth as needed for pain.    Marland Kitchen albuterol (PROAIR HFA) 108 (90 Base) MCG/ACT inhaler INHALE TWO PUFFS BY MOUTH UP TO EVERY 4 HOURS AS NEEDED FOR WHEEZING 8.5 g 5  . Ascorbic Acid (VITAMIN C) 100 MG tablet Take 100 mg by mouth daily.    Marland Kitchen aspirin 81 MG tablet Take 81 mg by mouth daily.    Marland Kitchen atorvastatin (LIPITOR) 20 MG tablet Take 0.5 tablets (10 mg total) by mouth daily. 45 tablet 3  . BIOTIN PO Take 1 tablet by mouth in the morning and at bedtime.    . calcium carbonate (OS-CAL) 600 MG TABS tablet Take 600 mg by mouth 2 (two) times daily with a meal.    . Cholecalciferol 4000 UNITS CAPS Take 4,000 Units by mouth daily. Pt takes 2 tablets of 2,000 units per day = 4,000 units a day    . Cyanocobalamin (VITAMIN B-12 PO) Take 6 mcg by mouth daily.    . Fluticasone-Salmeterol (ADVAIR DISKUS) 100-50 MCG/DOSE AEPB USE 1 PUFF TWICE DAILY DURING ALLERGY SEASON 180 each 3  . glipiZIDE (GLUCOTROL XL) 10 MG 24 hr tablet Take 1 tablet (10 mg total) by mouth daily. 90 tablet 3  . glucose blood (ACCU-CHEK GUIDE) test strip Use to check blood sugar twice daily as needed . Dx Code E11.9 100 each 5  . hyoscyamine (LEVSIN SL) 0.125 MG SL tablet Place one tablet under tongue once a day as needed. 30 tablet 1  . IRON, FERROUS GLUCONATE, PO Take 1 tablet by mouth 2 (two) times daily.     Marland Kitchen levothyroxine (SYNTHROID) 50 MCG tablet TAKE ONE TABLET BY MOUTH DAILY 90 tablet 3  .  lisinopril-hydrochlorothiazide (ZESTORETIC) 10-12.5 MG tablet Take 1 tablet by mouth daily. 90 tablet 3  . loratadine (CLARITIN) 10 MG tablet Take 10 mg by mouth daily.    . meloxicam (MOBIC) 15 MG tablet TAKE ONE TABLET BY MOUTH DAILY WITH FOOD AS NEEDED 90 tablet 1  . metFORMIN (GLUCOPHAGE) 1000 MG tablet TAKE ONE TABLET BY MOUTH TWICE A DAY WITH  A MEAL 180 tablet 3  . multivitamin (THERAGRAN) per tablet Take 1 tablet by mouth daily.    . sitaGLIPtin (JANUVIA) 100 MG tablet Take 1 tablet (100 mg total) by mouth daily. 90 tablet 3   No current facility-administered medications on file prior to visit.    Review of Systems  Constitutional: Negative for activity change, appetite change, fatigue, fever and unexpected weight change.  HENT: Negative for congestion, ear pain, rhinorrhea, sinus pressure and sore throat.   Eyes: Negative for pain, redness and visual disturbance.  Respiratory: Negative for cough, shortness of breath and wheezing.   Cardiovascular: Negative for chest pain and palpitations.       Very cold hands and feet  Gastrointestinal: Negative for abdominal pain, blood in stool, constipation and diarrhea.  Endocrine: Positive for cold intolerance. Negative for polydipsia and polyuria.  Genitourinary: Negative for dysuria, frequency and urgency.  Musculoskeletal: Negative for arthralgias, back pain and myalgias.  Skin: Negative for pallor and rash.  Allergic/Immunologic: Negative for environmental allergies.  Neurological: Negative for dizziness, syncope and headaches.  Hematological: Negative for adenopathy. Does not bruise/bleed easily.  Psychiatric/Behavioral: Negative for decreased concentration and dysphoric mood. The patient is not nervous/anxious.        Objective:   Physical Exam Constitutional:      General: She is not in acute distress.    Appearance: Normal appearance. She is obese. She is not ill-appearing.  Eyes:     General: No scleral icterus.       Right  eye: No discharge.        Left eye: No discharge.     Conjunctiva/sclera: Conjunctivae normal.     Pupils: Pupils are equal, round, and reactive to light.  Neck:     Vascular: No carotid bruit.  Cardiovascular:     Rate and Rhythm: Normal rate and regular rhythm.     Pulses: Normal pulses.     Heart sounds: Normal heart sounds. No murmur heard.   Pulmonary:     Effort: Pulmonary effort is normal. No respiratory distress.     Breath sounds: Normal breath sounds.  Musculoskeletal:        General: No tenderness.     Cervical back: Normal range of motion and neck supple. No tenderness.     Right lower leg: No edema.     Left lower leg: No edema.     Comments: No acute joint changes  Lymphadenopathy:     Cervical: No cervical adenopathy.  Skin:    General: Skin is warm and dry.     Coloration: Skin is not pale.     Findings: No bruising, erythema or rash.     Comments: Hands and feet are cool but not cold  No color changes at all -pink tips of fingers and toes Nl sensation   Neurological:     Mental Status: She is alert. Mental status is at baseline.     Sensory: No sensory deficit.  Psychiatric:        Mood and Affect: Mood normal.           Assessment & Plan:   Problem List Items Addressed This Visit      Endocrine   Hypothyroidism    Recently cold hands and feet  Has been taking biotin- ? If interfering with thyroid medicine or function  TSH today  May have to stop biotin if abn       Relevant Orders   TSH  Other   Cold hands and feet - Primary    In hypothyroid pt with well controlled DM Check tsh today  Disc poss of raynaud's -given handout  No color change (nl exam today with nl perfusion)  inst to keep hands feet warm and plan to outdoor time or other cold air exposure  Will update if symptoms worsen or if she notes color change or sensory change       Relevant Orders   TSH   CBC with Differential/Platelet

## 2020-10-08 NOTE — Assessment & Plan Note (Signed)
In hypothyroid pt with well controlled DM Check tsh today  Disc poss of raynaud's -given handout  No color change (nl exam today with nl perfusion)  inst to keep hands feet warm and plan to outdoor time or other cold air exposure  Will update if symptoms worsen or if she notes color change or sensory change

## 2020-10-08 NOTE — Assessment & Plan Note (Signed)
Recently cold hands and feet  Has been taking biotin- ? If interfering with thyroid medicine or function  TSH today  May have to stop biotin if abn

## 2020-10-12 ENCOUNTER — Encounter: Payer: Self-pay | Admitting: *Deleted

## 2020-11-03 ENCOUNTER — Other Ambulatory Visit: Payer: Self-pay | Admitting: Family Medicine

## 2020-11-03 DIAGNOSIS — G4733 Obstructive sleep apnea (adult) (pediatric): Secondary | ICD-10-CM | POA: Diagnosis not present

## 2021-01-05 ENCOUNTER — Other Ambulatory Visit: Payer: Medicare PPO

## 2021-01-11 ENCOUNTER — Ambulatory Visit: Payer: Medicare PPO | Admitting: Dermatology

## 2021-01-12 ENCOUNTER — Telehealth: Payer: Self-pay | Admitting: Family Medicine

## 2021-01-12 ENCOUNTER — Encounter: Payer: Medicare PPO | Admitting: Family Medicine

## 2021-01-12 DIAGNOSIS — I10 Essential (primary) hypertension: Secondary | ICD-10-CM

## 2021-01-12 DIAGNOSIS — E039 Hypothyroidism, unspecified: Secondary | ICD-10-CM

## 2021-01-12 DIAGNOSIS — E538 Deficiency of other specified B group vitamins: Secondary | ICD-10-CM

## 2021-01-12 DIAGNOSIS — E1169 Type 2 diabetes mellitus with other specified complication: Secondary | ICD-10-CM

## 2021-01-12 DIAGNOSIS — Z0000 Encounter for general adult medical examination without abnormal findings: Secondary | ICD-10-CM

## 2021-01-12 DIAGNOSIS — E119 Type 2 diabetes mellitus without complications: Secondary | ICD-10-CM

## 2021-01-12 NOTE — Telephone Encounter (Signed)
-----   Message from Cloyd Stagers, RT sent at 12/28/2020  1:50 PM EDT ----- Regarding: Lab Orders for Wednesday 5.11.2022 Please place lab orders for  Wednesday 5.11.2022, office visit for physical on Wednesday 5.18.2022 Thank you, Dyke Maes RT(R)

## 2021-01-13 ENCOUNTER — Other Ambulatory Visit: Payer: Self-pay

## 2021-01-13 ENCOUNTER — Ambulatory Visit (INDEPENDENT_AMBULATORY_CARE_PROVIDER_SITE_OTHER): Payer: Medicare PPO

## 2021-01-13 ENCOUNTER — Other Ambulatory Visit (INDEPENDENT_AMBULATORY_CARE_PROVIDER_SITE_OTHER): Payer: Medicare PPO

## 2021-01-13 DIAGNOSIS — I10 Essential (primary) hypertension: Secondary | ICD-10-CM | POA: Diagnosis not present

## 2021-01-13 DIAGNOSIS — E538 Deficiency of other specified B group vitamins: Secondary | ICD-10-CM | POA: Diagnosis not present

## 2021-01-13 DIAGNOSIS — Z0000 Encounter for general adult medical examination without abnormal findings: Secondary | ICD-10-CM | POA: Diagnosis not present

## 2021-01-13 DIAGNOSIS — E1169 Type 2 diabetes mellitus with other specified complication: Secondary | ICD-10-CM | POA: Diagnosis not present

## 2021-01-13 DIAGNOSIS — E039 Hypothyroidism, unspecified: Secondary | ICD-10-CM | POA: Diagnosis not present

## 2021-01-13 DIAGNOSIS — E785 Hyperlipidemia, unspecified: Secondary | ICD-10-CM

## 2021-01-13 DIAGNOSIS — E119 Type 2 diabetes mellitus without complications: Secondary | ICD-10-CM

## 2021-01-13 LAB — LIPID PANEL
Cholesterol: 179 mg/dL (ref 0–200)
HDL: 49.6 mg/dL (ref >39.00)
LDL Cholesterol: 94 mg/dL (ref 0–99)
NonHDL: 129.64
Total CHOL/HDL Ratio: 4
Triglycerides: 176 mg/dL — ABNORMAL HIGH (ref 0.0–149.0)
VLDL: 35.2 mg/dL (ref 0.0–40.0)

## 2021-01-13 LAB — COMPREHENSIVE METABOLIC PANEL
ALT: 15 U/L (ref 0–35)
AST: 15 U/L (ref 0–37)
Albumin: 4.3 g/dL (ref 3.5–5.2)
Alkaline Phosphatase: 52 U/L (ref 39–117)
BUN: 16 mg/dL (ref 6–23)
CO2: 31 mEq/L (ref 19–32)
Calcium: 9.6 mg/dL (ref 8.4–10.5)
Chloride: 102 mEq/L (ref 96–112)
Creatinine, Ser: 0.92 mg/dL (ref 0.40–1.20)
GFR: 63.34 mL/min (ref >60.00)
Glucose, Bld: 139 mg/dL — ABNORMAL HIGH (ref 70–99)
Potassium: 3.9 mEq/L (ref 3.5–5.1)
Sodium: 141 mEq/L (ref 135–145)
Total Bilirubin: 0.7 mg/dL (ref 0.2–1.2)
Total Protein: 6.6 g/dL (ref 6.0–8.3)

## 2021-01-13 LAB — CBC WITH DIFFERENTIAL/PLATELET
Basophils Absolute: 0 10*3/uL (ref 0.0–0.1)
Basophils Relative: 0.7 % (ref 0.0–3.0)
Eosinophils Absolute: 0.1 10*3/uL (ref 0.0–0.7)
Eosinophils Relative: 1.8 % (ref 0.0–5.0)
HCT: 39.6 % (ref 36.0–46.0)
Hemoglobin: 13.4 g/dL (ref 12.0–15.0)
Lymphocytes Relative: 26.3 % (ref 12.0–46.0)
Lymphs Abs: 1.7 10*3/uL (ref 0.7–4.0)
MCHC: 33.9 g/dL (ref 30.0–36.0)
MCV: 89 fl (ref 78.0–100.0)
Monocytes Absolute: 0.5 10*3/uL (ref 0.1–1.0)
Monocytes Relative: 7.4 % (ref 3.0–12.0)
Neutro Abs: 4.2 10*3/uL (ref 1.4–7.7)
Neutrophils Relative %: 63.8 % (ref 43.0–77.0)
Platelets: 254 10*3/uL (ref 150.0–400.0)
RBC: 4.45 Mil/uL (ref 3.87–5.11)
RDW: 12.4 % (ref 11.5–15.5)
WBC: 6.6 10*3/uL (ref 4.0–10.5)

## 2021-01-13 LAB — VITAMIN B12: Vitamin B-12: 378 pg/mL (ref 211–911)

## 2021-01-13 LAB — TSH: TSH: 6.42 u[IU]/mL — ABNORMAL HIGH (ref 0.35–4.50)

## 2021-01-13 LAB — HEMOGLOBIN A1C: Hgb A1c MFr Bld: 6.6 % — ABNORMAL HIGH (ref 4.6–6.5)

## 2021-01-13 NOTE — Patient Instructions (Signed)
Brooke Mitchell , Thank you for taking time to come for your Medicare Wellness Visit. I appreciate your ongoing commitment to your health goals. Please review the following plan we discussed and let me know if I can assist you in the future.   Screening recommendations/referrals: Colonoscopy: Up to date, completed 01/19/2015, due 01/2025 Mammogram: Up to date, completed 07/17/2020, due 07/2021 Bone Density: Up to date, completed 01/02/2017, due 2-5 years  Recommended yearly ophthalmology/optometry visit for glaucoma screening and checkup Recommended yearly dental visit for hygiene and checkup  Vaccinations: Influenza vaccine: Up to date, completed 05/30/2020, due 04/2021 Pneumococcal vaccine: Completed series Tdap vaccine: Up to date, completed 08/23/2016, due 08/2026 Shingles vaccine: Completed series   Covid-19:Completed series  Advanced directives: copy in chart  Conditions/risks identified: diabetes, hyperlipidemia   Next appointment: Follow up in one year for your annual wellness visit    Preventive Care 23 Years and Older, Female Preventive care refers to lifestyle choices and visits with your health care provider that can promote health and wellness. What does preventive care include?  A yearly physical exam. This is also called an annual well check.  Dental exams once or twice a year.  Routine eye exams. Ask your health care provider how often you should have your eyes checked.  Personal lifestyle choices, including:  Daily care of your teeth and gums.  Regular physical activity.  Eating a healthy diet.  Avoiding tobacco and drug use.  Limiting alcohol use.  Practicing safe sex.  Taking low-dose aspirin every day.  Taking vitamin and mineral supplements as recommended by your health care provider. What happens during an annual well check? The services and screenings done by your health care provider during your annual well check will depend on your age, overall  health, lifestyle risk factors, and family history of disease. Counseling  Your health care provider may ask you questions about your:  Alcohol use.  Tobacco use.  Drug use.  Emotional well-being.  Home and relationship well-being.  Sexual activity.  Eating habits.  History of falls.  Memory and ability to understand (cognition).  Work and work Statistician.  Reproductive health. Screening  You may have the following tests or measurements:  Height, weight, and BMI.  Blood pressure.  Lipid and cholesterol levels. These may be checked every 5 years, or more frequently if you are over 14 years old.  Skin check.  Lung cancer screening. You may have this screening every year starting at age 45 if you have a 30-pack-year history of smoking and currently smoke or have quit within the past 15 years.  Fecal occult blood test (FOBT) of the stool. You may have this test every year starting at age 2.  Flexible sigmoidoscopy or colonoscopy. You may have a sigmoidoscopy every 5 years or a colonoscopy every 10 years starting at age 44.  Hepatitis C blood test.  Hepatitis B blood test.  Sexually transmitted disease (STD) testing.  Diabetes screening. This is done by checking your blood sugar (glucose) after you have not eaten for a while (fasting). You may have this done every 1-3 years.  Bone density scan. This is done to screen for osteoporosis. You may have this done starting at age 32.  Mammogram. This may be done every 1-2 years. Talk to your health care provider about how often you should have regular mammograms. Talk with your health care provider about your test results, treatment options, and if necessary, the need for more tests. Vaccines  Your health care  provider may recommend certain vaccines, such as:  Influenza vaccine. This is recommended every year.  Tetanus, diphtheria, and acellular pertussis (Tdap, Td) vaccine. You may need a Td booster every 10  years.  Zoster vaccine. You may need this after age 10.  Pneumococcal 13-valent conjugate (PCV13) vaccine. One dose is recommended after age 20.  Pneumococcal polysaccharide (PPSV23) vaccine. One dose is recommended after age 29. Talk to your health care provider about which screenings and vaccines you need and how often you need them. This information is not intended to replace advice given to you by your health care provider. Make sure you discuss any questions you have with your health care provider. Document Released: 09/18/2015 Document Revised: 05/11/2016 Document Reviewed: 06/23/2015 Elsevier Interactive Patient Education  2017 [place] Prevention in the Home Falls can cause injuries. They can happen to people of all ages. There are many things you can do to make your home safe and to help prevent falls. What can I do on the outside of my home?  Regularly fix the edges of walkways and driveways and fix any cracks.  Remove anything that might make you trip as you walk through a door, such as a raised step or threshold.  Trim any bushes or trees on the path to your home.  Use bright outdoor lighting.  Clear any walking paths of anything that might make someone trip, such as rocks or tools.  Regularly check to see if handrails are loose or broken. Make sure that both sides of any steps have handrails.  Any raised decks and porches should have guardrails on the edges.  Have any leaves, snow, or ice cleared regularly.  Use sand or salt on walking paths during winter.  Clean up any spills in your garage right away. This includes oil or grease spills. What can I do in the bathroom?  Use night lights.  Install grab bars by the toilet and in the tub and shower. Do not use towel bars as grab bars.  Use non-skid mats or decals in the tub or shower.  If you need to sit down in the shower, use a plastic, non-slip stool.  Keep the floor dry. Clean up any water that  spills on the floor as soon as it happens.  Remove soap buildup in the tub or shower regularly.  Attach bath mats securely with double-sided non-slip rug tape.  Do not have throw rugs and other things on the floor that can make you trip. What can I do in the bedroom?  Use night lights.  Make sure that you have a light by your bed that is easy to reach.  Do not use any sheets or blankets that are too big for your bed. They should not hang down onto the floor.  Have a firm chair that has side arms. You can use this for support while you get dressed.  Do not have throw rugs and other things on the floor that can make you trip. What can I do in the kitchen?  Clean up any spills right away.  Avoid walking on wet floors.  Keep items that you use a lot in easy-to-reach places.  If you need to reach something above you, use a strong step stool that has a grab bar.  Keep electrical cords out of the way.  Do not use floor polish or wax that makes floors slippery. If you must use wax, use non-skid floor wax.  Do not have throw  rugs and other things on the floor that can make you trip. What can I do with my stairs?  Do not leave any items on the stairs.  Make sure that there are handrails on both sides of the stairs and use them. Fix handrails that are broken or loose. Make sure that handrails are as long as the stairways.  Check any carpeting to make sure that it is firmly attached to the stairs. Fix any carpet that is loose or worn.  Avoid having throw rugs at the top or bottom of the stairs. If you do have throw rugs, attach them to the floor with carpet tape.  Make sure that you have a light switch at the top of the stairs and the bottom of the stairs. If you do not have them, ask someone to add them for you. What else can I do to help prevent falls?  Wear shoes that:  Do not have high heels.  Have rubber bottoms.  Are comfortable and fit you well.  Are closed at the  toe. Do not wear sandals.  If you use a stepladder:  Make sure that it is fully opened. Do not climb a closed stepladder.  Make sure that both sides of the stepladder are locked into place.  Ask someone to hold it for you, if possible.  Clearly mark and make sure that you can see:  Any grab bars or handrails.  First and last steps.  Where the edge of each step is.  Use tools that help you move around (mobility aids) if they are needed. These include:  Canes.  Walkers.  Scooters.  Crutches.  Turn on the lights when you go into a dark area. Replace any light bulbs as soon as they burn out.  Set up your furniture so you have a clear path. Avoid moving your furniture around.  If any of your floors are uneven, fix them.  If there are any pets around you, be aware of where they are.  Review your medicines with your doctor. Some medicines can make you feel dizzy. This can increase your chance of falling. Ask your doctor what other things that you can do to help prevent falls. This information is not intended to replace advice given to you by your health care provider. Make sure you discuss any questions you have with your health care provider. Document Released: 06/18/2009 Document Revised: 01/28/2016 Document Reviewed: 09/26/2014 Elsevier Interactive Patient Education  2017 [employer].

## 2021-01-13 NOTE — Progress Notes (Signed)
PCP notes:  Health Maintenance: No gaps noted   Abnormal Screenings: none   Patient concerns: Discuss Prevagen Discuss urinary incontinence    Nurse concerns: none   Next PCP appt.: 01/20/2021 @ 9 am

## 2021-01-13 NOTE — Progress Notes (Addendum)
Subjective:   Brooke Mitchell is a 70 y.o. female who presents for Medicare Annual (Subsequent) preventive examination.  Review of Systems: N/A      I connected with the patient today by telephone and verified that I am speaking with the correct person using two identifiers. Location patient: home Location nurse: work Persons participating in the telephone visit: patient, nurse.   I discussed the limitations, risks, security and privacy concerns of performing an evaluation and management service by telephone and the availability of in person appointments. I also discussed with the patient that there may be a patient responsible charge related to this service. The patient expressed understanding and verbally consented to this telephonic visit.        Cardiac Risk Factors include: advanced age (>17men, >80 women);diabetes mellitus;Other (see comment), Risk factor comments: hyperlipidemia     Objective:    Today's Vitals   There is no height or weight on file to calculate BMI.  Advanced Directives 01/13/2021 01/13/2020 09/01/2019 01/04/2019 04/10/2018 12/29/2017 05/05/2017  Does Patient Have a Medical Advance Directive? Yes Yes Yes Yes Yes Yes Yes  Type of Paramedic of [place];Living will [place];Living will [place];Living will [place];Living will [place];Living will Living will;Healthcare Power of Attorney Living will  Does patient want to make changes to medical advance directive? - - - No - Patient declined No - Patient declined - -  Copy of [place] in Chart? Yes - validated most recent copy scanned in chart (See row information) No - copy requested - No - copy requested No - copy requested No - copy requested -  Would patient like information on creating a medical advance directive? - - - - - - -    Current Medications (verified) Outpatient Encounter  Medications as of 01/13/2021  Medication Sig  . acetaminophen (TYLENOL) 325 MG tablet Take 650 mg by mouth as needed for pain.  Marland Kitchen albuterol (PROAIR HFA) 108 (90 Base) MCG/ACT inhaler INHALE TWO PUFFS BY MOUTH UP TO EVERY 4 HOURS AS NEEDED FOR WHEEZING  . Ascorbic Acid (VITAMIN C) 100 MG tablet Take 100 mg by mouth daily.  Marland Kitchen aspirin 81 MG tablet Take 81 mg by mouth daily.  Marland Kitchen atorvastatin (LIPITOR) 20 MG tablet Take 0.5 tablets (10 mg total) by mouth daily.  Marland Kitchen BIOTIN PO Take 1 tablet by mouth in the morning and at bedtime.  . calcium carbonate (OS-CAL) 600 MG TABS tablet Take 600 mg by mouth 2 (two) times daily with a meal.  . Cholecalciferol 4000 UNITS CAPS Take 4,000 Units by mouth daily. Pt takes 2 tablets of 2,000 units per day = 4,000 units a day  . Cyanocobalamin (VITAMIN B-12 PO) Take 6 mcg by mouth daily.  . Fluticasone-Salmeterol (ADVAIR DISKUS) 100-50 MCG/DOSE AEPB USE 1 PUFF TWICE DAILY DURING ALLERGY SEASON  . glipiZIDE (GLUCOTROL XL) 10 MG 24 hr tablet Take 1 tablet (10 mg total) by mouth daily.  Marland Kitchen glucose blood (ACCU-CHEK GUIDE) test strip Use to check blood sugar twice daily as needed . Dx Code E11.9  . hyoscyamine (LEVSIN SL) 0.125 MG SL tablet Place one tablet under tongue once a day as needed.  . IRON, FERROUS GLUCONATE, PO Take 1 tablet by mouth 2 (two) times daily.   Marland Kitchen levothyroxine (SYNTHROID) 50 MCG tablet TAKE ONE TABLET BY MOUTH DAILY  . lisinopril-hydrochlorothiazide (ZESTORETIC) 10-12.5 MG tablet Take 1 tablet by mouth daily.  Marland Kitchen  loratadine (CLARITIN) 10 MG tablet Take 10 mg by mouth daily.  . meloxicam (MOBIC) 15 MG tablet TAKE ONE TABLET BY MOUTH DAILY WITH FOOD AS NEEDED  . metFORMIN (GLUCOPHAGE) 1000 MG tablet TAKE ONE TABLET BY MOUTH TWICE A DAY WITH A MEAL  . multivitamin (THERAGRAN) per tablet Take 1 tablet by mouth daily.  . sitaGLIPtin (JANUVIA) 100 MG tablet Take 1 tablet (100 mg total) by mouth daily.   No facility-administered encounter medications on file as  of 01/13/2021.    Allergies (verified) Patient has no known allergies.   History: Past Medical History:  Diagnosis Date  . Allergic rhinitis   . Allergy   . Anxiety   . Arthritis    osteoarthritis  . Asthma    As a child   . Depression   . Diabetes mellitus    Type II (2/06 elevated microalbumin)  . Frequent UTI    with coital prohylaxis   . GERD (gastroesophageal reflux disease)   . Hyperlipidemia   . Hypertension   . Hypothyroidism   . IDA (iron deficiency anemia)   . Obesity   . OSA (obstructive sleep apnea)    CPAP  . Sleep apnea    wears c-pap   Past Surgical History:  Procedure Laterality Date  . BELPHAROPTOSIS REPAIR    . COLONOSCOPY    . DILATION AND CURETTAGE OF UTERUS    . ENDOMETRIAL BIOPSY    . FOOT SURGERY    . UPPER GASTROINTESTINAL ENDOSCOPY     Family History  Problem Relation Age of Onset  . Diabetes Mother   . Coronary artery disease Mother   . Hypothyroidism Mother   . Irritable bowel syndrome Mother   . Alzheimer's disease Father   . Nephrolithiasis Father   . Hypothyroidism Brother   . Breast cancer Paternal Grandmother 16  . Colon cancer Neg Hx   . Esophageal cancer Neg Hx   . Rectal cancer Neg Hx   . Stomach cancer Neg Hx    Social History   Socioeconomic History  . Marital status: Widowed    Spouse name: Not on file  . Number of children: 2  . Years of education: Not on file  . Highest education level: Not on file  Occupational History  . Occupation: Product manager: [employer]    Employer: RETIRED  Tobacco Use  . Smoking status: Never Smoker  . Smokeless tobacco: Never Used  Vaping Use  . Vaping Use: Never used  Substance and Sexual Activity  . Alcohol use: Yes    Alcohol/week: 0.0 standard drinks    Comment: occasional  . Drug use: No  . Sexual activity: Not on file  Other Topics Concern  . Not on file  Social History Narrative   Some walking for exercise   Widow 11/2013- Cared for husband full  time with ALS at home    Social Determinants of Health   Financial Resource Strain: Low Risk   . Difficulty of Paying Living Expenses: Not hard at all  Food Insecurity: No Food Insecurity  . Worried About Charity fundraiser in the Last Year: Never true  . Ran Out of Food in the Last Year: Never true  Transportation Needs: No Transportation Needs  . Lack of Transportation (Medical): No  . Lack of Transportation (Non-Medical): No  Physical Activity: Insufficiently Active  . Days of Exercise per Week: 7 days  . Minutes of Exercise per Session: 20 min  Stress:  No Stress Concern Present  . Feeling of Stress : Not at all  Social Connections: Not on file    Tobacco Counseling Counseling given: Not Answered   Clinical Intake:  Pre-visit preparation completed: Yes  Pain : No/denies pain     Nutritional Risks: None Diabetes: Yes CBG done?: No Did pt. bring in CBG monitor from home?: No  How often do you need to have someone help you when you read instructions, pamphlets, or other written materials from your doctor or pharmacy?: 1 - Never What is the last grade level you completed in school?: college  Diabetic: Yes Nutrition Risk Assessment:  Has the patient had any N/V/D within the last 2 months?  No  Does the patient have any non-healing wounds?  No  Has the patient had any unintentional weight loss or weight gain?  No   Diabetes:  Is the patient diabetic?  Yes  If diabetic, was a CBG obtained today?  No  telephone visit  Did the patient bring in their glucometer from home?  No  telephone visit  How often do you monitor your CBG's? daily.   Financial Strains and Diabetes Management:  Are you having any financial strains with the device, your supplies or your medication? No .  Does the patient want to be seen by Chronic Care Management for management of their diabetes?  No  Would the patient like to be referred to a Nutritionist or for Diabetic Management?  No    Diabetic Exams:  Diabetic Eye Exam: Completed 04/15/2020 Diabetic Foot Exam: Completed 07/21/2020   Interpreter Needed?: No  Information entered by :: CJohnson, LPN   Activities of Daily Living In your present state of health, do you have any difficulty performing the following activities: 01/13/2021  Hearing? N  Vision? N  Difficulty concentrating or making decisions? N  Walking or climbing stairs? N  Dressing or bathing? N  Doing errands, shopping? N  Preparing Food and eating ? N  Using the Toilet? N  In the past six months, have you accidently leaked urine? Y  Comment wears pads when going out  Do you have problems with loss of bowel control? N  Managing your Medications? N  Managing your Finances? N  Housekeeping or managing your Housekeeping? N  Some recent data might be hidden    Patient Care Team: Tower, Wynelle Fanny, MD as PCP - General Clance, Armando Reichert, MD as Attending Physician (Pulmonary Disease)  Indicate any recent Medical Services you may have received from other than [facility] providers in the past year (date may be approximate).     Assessment:   This is a routine wellness examination for [place].  Hearing/Vision screen  Hearing Screening   125Hz  250Hz  500Hz  1000Hz  2000Hz  3000Hz  4000Hz  6000Hz  8000Hz   Right ear:           Left ear:           Vision Screening Comments: Patient gets annual eye exams   Dietary issues and exercise activities discussed: Current Exercise Habits: Home exercise routine, Type of exercise: walking, Time (Minutes): 20, Frequency (Times/Week): 7, Weekly Exercise (Minutes/Week): 140, Intensity: Moderate, Exercise limited by: None identified  Goals Addressed            This Visit's Progress   . Patient Stated       01/13/2021, I will continue to walk my dog daily for about 20 minutes.      Depression Screen PHQ 2/9 Scores 01/13/2021 01/13/2020 01/04/2019 04/10/2018 12/29/2017  12/23/2016  PHQ - 2 Score 0 0 0 0 0 0  PHQ- 9 Score 0 0 0 - 0  -    Fall Risk Fall Risk  01/13/2021 01/13/2020 01/04/2019 05/22/2018 04/10/2018  Falls in the past year? 0 1 1 No No  Comment - tripped on sidewalk slipped on wet floor; hit head - -  Number falls in past yr: 0 0 0 - -  Injury with Fall? 0 0 1 - -  Risk for fall due to : Medication side effect Medication side effect - - -  Follow up Falls evaluation completed;Falls prevention discussed Falls evaluation completed;Falls prevention discussed - - -    FALL RISK PREVENTION PERTAINING TO THE HOME:  Any stairs in or around the home? Yes  If so, are there any without handrails? No  Home free of loose throw rugs in walkways, pet beds, electrical cords, etc? Yes  Adequate lighting in your home to reduce risk of falls? Yes   ASSISTIVE DEVICES UTILIZED TO PREVENT FALLS:  Life alert? No  Use of a cane, walker or w/c? No  Grab bars in the bathroom? Yes  Shower chair or bench in shower? No  Elevated toilet seat or a handicapped toilet? No   TIMED UP AND GO:  Was the test performed? N/A telephone visit .   Cognitive Function: MMSE - Mini Mental State Exam 01/13/2021 01/13/2020 01/04/2019 12/29/2017  Not completed: Unable to complete - - -  Orientation to time - 5 5 5   Orientation to Place - 5 5 5   Registration - 3 3 3   Attention/ Calculation - 5 0 0  Recall - 3 3 2   Recall-comments - - - unable to recall 1 of 3 words  Language- name 2 objects - - 0 0  Language- repeat - 1 1 1   Language- follow 3 step command - - 0 3  Language- read & follow direction - - 0 0  Write a sentence - - 0 0  Copy design - - 0 0  Total score - - 17 19  Mini Cog  Mini-Cog screen was not completed. Phone connection was bad. Difficulty hearing during call. Maximum score is 22. A value of 0 denotes this part of the MMSE was not completed or the patient failed this part of the Mini-Cog screening.       Immunizations Immunization History  Administered Date(s) Administered  . Fluad Quad(high Dose 65+) 05/08/2019,  05/30/2020  . H1N1 09/22/2008  . Influenza Split 05/31/2012  . Influenza Whole 06/05/2006, 06/29/2007, 07/09/2009, 08/04/2010  . Influenza,inj,Quad PF,6+ Mos 06/19/2013, 06/25/2014, 06/16/2015, 06/14/2016, 06/14/2017, 05/25/2018  . PFIZER(Purple Top)SARS-COV-2 Vaccination 09/25/2019, 10/16/2019, 06/17/2020  . Pneumococcal Conjugate-13 12/23/2016  . Pneumococcal Polysaccharide-23 04/03/2009, 12/29/2017  . Td 01/04/2004, 03/31/2010  . Tdap 08/23/2016  . Zoster 04/29/2011  . Zoster Recombinat (Shingrix) 07/28/2017, 03/24/2018    TDAP status: Up to date  Flu Vaccine status: Up to date  Pneumococcal vaccine status: Up to date  Covid-19 vaccine status: Completed vaccines  Qualifies for Shingles Vaccine? Yes   Zostavax completed Yes   Shingrix Completed?: Yes  Screening Tests Health Maintenance  Topic Date Due  . HEMOGLOBIN A1C  01/13/2021  . INFLUENZA VACCINE  04/05/2021  . OPHTHALMOLOGY EXAM  04/15/2021  . FOOT EXAM  07/21/2021  . MAMMOGRAM  07/17/2022  . COLONOSCOPY (Pts 45-27yrs Insurance coverage will need to be confirmed)  01/18/2025  . TETANUS/TDAP  08/23/2026  . DEXA SCAN  Completed  . COVID-19 Vaccine  Completed  . Hepatitis C Screening  Completed  . PNA vac Low Risk Adult  Completed  . HPV VACCINES  Aged Out    Health Maintenance  Health Maintenance Due  Topic Date Due  . HEMOGLOBIN A1C  01/13/2021    Colorectal cancer screening: Type of screening: Colonoscopy. Completed 01/19/2015. Repeat every 10 years  Mammogram status: Completed 07/17/2020. Repeat every year  Bone Density status: Completed 01/02/2017. Results reflect: Bone density results: NORMAL. Repeat every 2-5 years.  Lung Cancer Screening: (Low Dose CT Chest recommended if Age 58-80 years, 30 pack-year currently smoking OR have quit w/in 15 years.) does not qualify.   Additional Screening:  Hepatitis C Screening: does qualify; Completed 12/29/2017  Vision Screening: Recommended annual  ophthalmology exams for early detection of glaucoma and other disorders of the eye. Is the patient up to date with their annual eye exam?  Yes  Who is the provider or what is the name of the office in which the patient attends annual eye exams? [facility]  If pt is not established with a provider, would they like to be referred to a provider to establish care? No .   Dental Screening: Recommended annual dental exams for proper oral hygiene  Community Resource Referral / Chronic Care Management: CRR required this visit?  No   CCM required this visit?  No      Plan:     I have personally reviewed and noted the following in the patient's chart:   . Medical and social history . Use of alcohol, tobacco or illicit drugs  . Current medications and supplements including opioid prescriptions.  . Functional ability and status . Nutritional status . Physical activity . Advanced directives . List of other physicians . Hospitalizations, surgeries, and ER visits in previous 12 months . Vitals . Screenings to include cognitive, depression, and falls . Referrals and appointments  In addition, I have reviewed and discussed with patient certain preventive protocols, quality metrics, and best practice recommendations. A written personalized care plan for preventive services as well as general preventive health recommendations were provided to patient.   Due to this being a telephonic visit, the after visit summary with patients personalized plan was offered to patient via office or my-chart. Patient preferred to pick up at office at next visit or via mychart.   Andrez Grime, LPN

## 2021-01-14 ENCOUNTER — Encounter: Payer: Medicare PPO | Admitting: Family Medicine

## 2021-01-18 ENCOUNTER — Other Ambulatory Visit: Payer: Self-pay | Admitting: Family Medicine

## 2021-01-18 DIAGNOSIS — H04129 Dry eye syndrome of unspecified lacrimal gland: Secondary | ICD-10-CM | POA: Diagnosis not present

## 2021-01-18 DIAGNOSIS — E119 Type 2 diabetes mellitus without complications: Secondary | ICD-10-CM | POA: Diagnosis not present

## 2021-01-18 DIAGNOSIS — H02839 Dermatochalasis of unspecified eye, unspecified eyelid: Secondary | ICD-10-CM | POA: Diagnosis not present

## 2021-01-18 DIAGNOSIS — H2513 Age-related nuclear cataract, bilateral: Secondary | ICD-10-CM | POA: Diagnosis not present

## 2021-01-18 DIAGNOSIS — H40019 Open angle with borderline findings, low risk, unspecified eye: Secondary | ICD-10-CM | POA: Diagnosis not present

## 2021-01-20 ENCOUNTER — Other Ambulatory Visit: Payer: Self-pay | Admitting: Family Medicine

## 2021-01-20 ENCOUNTER — Other Ambulatory Visit: Payer: Self-pay

## 2021-01-20 ENCOUNTER — Encounter: Payer: Self-pay | Admitting: Family Medicine

## 2021-01-20 ENCOUNTER — Ambulatory Visit (INDEPENDENT_AMBULATORY_CARE_PROVIDER_SITE_OTHER): Payer: Medicare PPO | Admitting: Family Medicine

## 2021-01-20 VITALS — BP 118/78 | HR 84 | Temp 96.9°F | Ht 64.0 in | Wt 210.1 lb

## 2021-01-20 DIAGNOSIS — Z6835 Body mass index (BMI) 35.0-35.9, adult: Secondary | ICD-10-CM | POA: Diagnosis not present

## 2021-01-20 DIAGNOSIS — E119 Type 2 diabetes mellitus without complications: Secondary | ICD-10-CM | POA: Diagnosis not present

## 2021-01-20 DIAGNOSIS — Z0000 Encounter for general adult medical examination without abnormal findings: Secondary | ICD-10-CM | POA: Diagnosis not present

## 2021-01-20 DIAGNOSIS — E538 Deficiency of other specified B group vitamins: Secondary | ICD-10-CM | POA: Diagnosis not present

## 2021-01-20 DIAGNOSIS — Z86006 Personal history of melanoma in-situ: Secondary | ICD-10-CM

## 2021-01-20 DIAGNOSIS — I10 Essential (primary) hypertension: Secondary | ICD-10-CM | POA: Diagnosis not present

## 2021-01-20 DIAGNOSIS — E039 Hypothyroidism, unspecified: Secondary | ICD-10-CM

## 2021-01-20 DIAGNOSIS — E1169 Type 2 diabetes mellitus with other specified complication: Secondary | ICD-10-CM

## 2021-01-20 DIAGNOSIS — E785 Hyperlipidemia, unspecified: Secondary | ICD-10-CM

## 2021-01-20 MED ORDER — LISINOPRIL-HYDROCHLOROTHIAZIDE 10-12.5 MG PO TABS: 1.0000 | ORAL_TABLET | Freq: Every day | ORAL | 3 refills | Status: DC

## 2021-01-20 MED ORDER — SITAGLIPTIN PHOSPHATE 100 MG PO TABS: 100.0000 mg | ORAL_TABLET | Freq: Every day | ORAL | 3 refills | Status: DC

## 2021-01-20 MED ORDER — GLIPIZIDE ER 10 MG PO TB24: 10.0000 mg | ORAL_TABLET | Freq: Every day | ORAL | 3 refills | Status: DC

## 2021-01-20 MED ORDER — METFORMIN HCL 1000 MG PO TABS: ORAL_TABLET | ORAL | 3 refills | Status: DC

## 2021-01-20 MED ORDER — LEVOTHYROXINE SODIUM 50 MCG PO TABS: ORAL_TABLET | ORAL | 3 refills | Status: DC

## 2021-01-20 MED ORDER — ATORVASTATIN CALCIUM 20 MG PO TABS: 20.0000 mg | ORAL_TABLET | Freq: Every day | ORAL | 3 refills | Status: DC

## 2021-01-20 NOTE — Assessment & Plan Note (Signed)
bp in fair control at this time  BP Readings from Last 1 Encounters:  01/20/21 118/78   No changes needed Most recent labs reviewed  Disc lifstyle change with low sodium diet and exercise  Plan to continue lisinopril hct 10-12.5 mg daily

## 2021-01-20 NOTE — Progress Notes (Signed)
Subjective:    Patient ID: Brooke Mitchell, female    DOB: [birth year], 70 y.o.   MRN: [id number]  This visit occurred during the SARS-CoV-2 public health emergency.  Safety protocols were in place, including screening questions prior to the visit, additional usage of staff PPE, and extensive cleaning of exam room while observing appropriate contact time as indicated for disinfecting solutions.    HPI Here for health maintenance exam and to review chronic medical problems    Wt Readings from Last 3 Encounters:  01/20/21 210 lb 2 oz (95.3 kg)  10/08/20 207 lb 8 oz (94.1 kg)  07/21/20 205 lb 8 oz (93.2 kg)   36.07 kg/m   Feeling ok  Taking care of herself   Had amw on 5/11  Mammogram 11/21 Self breast exam no lumps  dexa 4/18- normal  Falls- none Fractures-none  Supplements-taking ca and D   Colonoscopy 2016-no polyps  Hemorrhoids do not change   covid immunized  Had shingrix vaccine   HTN bp is stable today  No cp or palpitations or headaches or edema  No side effects to medicines  BP Readings from Last 3 Encounters:  01/20/21 118/78  10/08/20 122/68  07/21/20 135/80    Lisinopril hct 10-12.5 once daily   Pulse Readings from Last 3 Encounters:  01/20/21 84  10/08/20 85  07/21/20 89     DM 2 Lab Results  Component Value Date   HGBA1C 6.6 (H) 01/13/2021   This is up from 6.4 glipizide xl 10 mg daily  januvia 100 mg daily  Metformin 1000 mg bid No concerns about blood sugars  Eye exam utd  Ace and statin   Mindful of diet Walking for exercise regularly   Hyperlipidemia Lab Results  Component Value Date   CHOL 179 01/13/2021   CHOL 162 07/16/2020   CHOL 170 01/08/2020   Lab Results  Component Value Date   HDL 49.60 01/13/2021   HDL 47.40 07/16/2020   HDL 45.60 01/08/2020   Lab Results  Component Value Date   LDLCALC 94 01/13/2021   LDLCALC 90 07/16/2020   LDLCALC 98 01/08/2020   Lab Results  Component Value Date   TRIG 176.0 (H)  01/13/2021   TRIG 121.0 07/16/2020   TRIG 133.0 01/08/2020   Lab Results  Component Value Date   CHOLHDL 4 01/13/2021   CHOLHDL 3 07/16/2020   CHOLHDL 4 01/08/2020   Lab Results  Component Value Date   LDLDIRECT 85.0 05/22/2018   LDLDIRECT 97.0 11/21/2017   LDLDIRECT 84.0 03/27/2017   Taking atorvastatin 10 mg daily (will inc it )  Not a lot of sat fats in diet  Some french fries- about 1-2 times per week   Hypothyroidism  Pt has no clinical changes No change in energy level/ hair or skin/ edema and no tremor Lab Results  Component Value Date   TSH 6.42 (H) 01/13/2021     Levothyroxine 50 mcg daily  No longer on biotin  No missed doses  Does take it at night   H/o melanoma in situ  Utd with dermatology f/u  Nothing new   B12 def Lab Results  Component Value Date   VITAMINB12 378 01/13/2021   Other labs Lab Results  Component Value Date   CREATININE 0.92 01/13/2021   BUN 16 01/13/2021   NA 141 01/13/2021   K 3.9 01/13/2021   CL 102 01/13/2021   CO2 31 01/13/2021   Lab Results  Component Value Date  ALT 15 01/13/2021   AST 15 01/13/2021   ALKPHOS 52 01/13/2021   BILITOT 0.7 01/13/2021   Lab Results  Component Value Date   WBC 6.6 01/13/2021   HGB 13.4 01/13/2021   HCT 39.6 01/13/2021   MCV 89.0 01/13/2021   PLT 254.0 01/13/2021     Patient Active Problem List   Diagnosis Date Noted  . Cold hands and feet 10/08/2020  . History of melanoma in situ 01/07/2018  . Welcome to Medicare preventive visit 12/23/2016  . Screening mammogram, encounter for 12/23/2016  . Estrogen deficiency 12/23/2016  . Neck pain 08/30/2016  . Foot pain, left 12/23/2015  . Urge incontinence 12/23/2015  . Anemia, iron deficiency 02/19/2015  . Vitamin B 12 deficiency 12/05/2014  . Fatigue 11/26/2014  . Grief reaction 01/22/2014  . Colon cancer screening 10/24/2012  . Severe obesity (BMI 35.0-35.9 with comorbidity) ([place]) 10/19/2011  . Other screening mammogram  04/13/2011  . Routine general medical examination at a health care facility 04/07/2011  . UTI'S, RECURRENT 02/03/2010  . POSTMENOPAUSAL STATUS 03/28/2008  . Hypothyroidism 11/24/2006  . Diabetes type 2, controlled ([place]) 11/24/2006  . Hyperlipidemia associated with type 2 diabetes mellitus ([place]) 11/24/2006  . RESTLESS LEG SYNDROME 11/24/2006  . Essential hypertension 11/24/2006  . PREMATURE VENTRICULAR CONTRACTIONS 11/24/2006  . HEMORRHOIDS 11/24/2006  . ALLERGIC RHINITIS 11/24/2006  . GERD 11/24/2006  . HIATAL HERNIA 11/24/2006  . OVERACTIVE BLADDER 11/24/2006  . [place] DISEASE, CERVICAL 11/24/2006  . Sleep apnea 11/24/2006   Past Medical History:  Diagnosis Date  . Allergic rhinitis   . Allergy   . Anxiety   . Arthritis    osteoarthritis  . Asthma    As a child   . Depression   . Diabetes mellitus    Type II (2/06 elevated microalbumin)  . Frequent UTI    with coital prohylaxis   . GERD (gastroesophageal reflux disease)   . Hyperlipidemia   . Hypertension   . Hypothyroidism   . IDA (iron deficiency anemia)   . Obesity   . OSA (obstructive sleep apnea)    CPAP  . Sleep apnea    wears c-pap   Past Surgical History:  Procedure Laterality Date  . BELPHAROPTOSIS REPAIR    . COLONOSCOPY    . DILATION AND CURETTAGE OF UTERUS    . ENDOMETRIAL BIOPSY    . FOOT SURGERY    . UPPER GASTROINTESTINAL ENDOSCOPY     Social History   Tobacco Use  . Smoking status: Never Smoker  . Smokeless tobacco: Never Used  Vaping Use  . Vaping Use: Never used  Substance Use Topics  . Alcohol use: Yes    Alcohol/week: 0.0 standard drinks    Comment: occasional  . Drug use: No   Family History  Problem Relation Age of Onset  . Diabetes Mother   . Coronary artery disease Mother   . Hypothyroidism Mother   . Irritable bowel syndrome Mother   . Alzheimer's disease Father   . Nephrolithiasis Father   . Hypothyroidism Brother   . Breast cancer Paternal Grandmother 46  . Colon  cancer Neg Hx   . Esophageal cancer Neg Hx   . Rectal cancer Neg Hx   . Stomach cancer Neg Hx    No Known Allergies Current Outpatient Medications on File Prior to Visit  Medication Sig Dispense Refill  . acetaminophen (TYLENOL) 325 MG tablet Take 650 mg by mouth as needed for pain.    Marland Kitchen albuterol (PROAIR HFA)  108 (90 Base) MCG/ACT inhaler INHALE TWO PUFFS BY MOUTH UP TO EVERY 4 HOURS AS NEEDED FOR WHEEZING 8.5 g 5  . Ascorbic Acid (VITAMIN C) 100 MG tablet Take 100 mg by mouth daily.    Marland Kitchen aspirin 81 MG tablet Take 81 mg by mouth daily.    . calcium carbonate (OS-CAL) 600 MG TABS tablet Take 600 mg by mouth 2 (two) times daily with a meal.    . Cholecalciferol 4000 UNITS CAPS Take 4,000 Units by mouth daily. Pt takes 2 tablets of 2,000 units per day = 4,000 units a day    . Cyanocobalamin (VITAMIN B-12 PO) Take 6 mcg by mouth daily.    . Fluticasone-Salmeterol (ADVAIR DISKUS) 100-50 MCG/DOSE AEPB USE 1 PUFF TWICE DAILY DURING ALLERGY SEASON 180 each 3  . glucose blood (ACCU-CHEK GUIDE) test strip USE TO CHECK BLOOD SUGAR TWICE DAILY AS NEEDED 100 strip 0  . hyoscyamine (LEVSIN SL) 0.125 MG SL tablet Place one tablet under tongue once a day as needed. 30 tablet 1  . IRON, FERROUS GLUCONATE, PO Take 1 tablet by mouth 2 (two) times daily.     Marland Kitchen loratadine (CLARITIN) 10 MG tablet Take 10 mg by mouth daily.    . meloxicam (MOBIC) 15 MG tablet TAKE ONE TABLET BY MOUTH DAILY WITH FOOD AS NEEDED 90 tablet 1  . multivitamin (THERAGRAN) per tablet Take 1 tablet by mouth daily.     No current facility-administered medications on file prior to visit.    Review of Systems  Constitutional: Negative for activity change, appetite change, fatigue, fever and unexpected weight change.  HENT: Negative for congestion, ear pain, rhinorrhea, sinus pressure and sore throat.   Eyes: Negative for pain, redness and visual disturbance.  Respiratory: Negative for cough, shortness of breath and wheezing.    Cardiovascular: Negative for chest pain and palpitations.  Gastrointestinal: Negative for abdominal pain, blood in stool, constipation and diarrhea.  Endocrine: Negative for polydipsia and polyuria.  Genitourinary: Negative for dysuria, frequency and urgency.  Musculoskeletal: Positive for arthralgias and back pain. Negative for myalgias.  Skin: Negative for pallor and rash.  Allergic/Immunologic: Negative for environmental allergies.  Neurological: Negative for dizziness, syncope and headaches.  Hematological: Negative for adenopathy. Does not bruise/bleed easily.  Psychiatric/Behavioral: Negative for decreased concentration and dysphoric mood. The patient is not nervous/anxious.        Objective:   Physical Exam Constitutional:      General: She is not in acute distress.    Appearance: Normal appearance. She is well-developed. She is obese. She is not ill-appearing or diaphoretic.  HENT:     Head: Normocephalic and atraumatic.     Right Ear: Tympanic membrane, ear canal and external ear normal.     Left Ear: Tympanic membrane, ear canal and external ear normal.     Nose: Nose normal. No congestion.     Mouth/Throat:     Mouth: Mucous membranes are moist.     Pharynx: Oropharynx is clear. No posterior oropharyngeal erythema.  Eyes:     General: No scleral icterus.    Extraocular Movements: Extraocular movements intact.     Conjunctiva/sclera: Conjunctivae normal.     Pupils: Pupils are equal, round, and reactive to light.  Neck:     Thyroid: No thyromegaly.     Vascular: No carotid bruit or JVD.  Cardiovascular:     Rate and Rhythm: Normal rate and regular rhythm.     Pulses: Normal pulses.  Heart sounds: Normal heart sounds. No gallop.   Pulmonary:     Effort: Pulmonary effort is normal. No respiratory distress.     Breath sounds: Normal breath sounds. No wheezing.     Comments: Good air exch Chest:     Chest wall: No tenderness.  Abdominal:     General: Bowel  sounds are normal. There is no distension or abdominal bruit.     Palpations: Abdomen is soft. There is no mass.     Tenderness: There is no abdominal tenderness.     Hernia: No hernia is present.  Genitourinary:    Comments: Breast exam: No mass, nodules, thickening, tenderness, bulging, retraction, inflamation, nipple discharge or skin changes noted.  No axillary or clavicular LA.     Musculoskeletal:        General: No tenderness. Normal range of motion.     Cervical back: Normal range of motion and neck supple. No rigidity. No muscular tenderness.     Right lower leg: No edema.     Left lower leg: No edema.     Comments: No kyphosis   Lymphadenopathy:     Cervical: No cervical adenopathy.  Skin:    General: Skin is warm and dry.     Coloration: Skin is not pale.     Findings: No erythema or rash.     Comments: Solar lentigines diffusely Some SKs Lipoma at base of neck/upper back  Neurological:     Mental Status: She is alert. Mental status is at baseline.     Cranial Nerves: No cranial nerve deficit.     Motor: No abnormal muscle tone.     Coordination: Coordination normal.     Gait: Gait normal.     Deep Tendon Reflexes: Reflexes are normal and symmetric. Reflexes normal.  Psychiatric:        Mood and Affect: Mood normal.        Cognition and Memory: Cognition and memory normal.           Assessment & Plan:   Problem List Items Addressed This Visit      Cardiovascular and Mediastinum   Essential hypertension    bp in fair control at this time  BP Readings from Last 1 Encounters:  01/20/21 118/78   No changes needed Most recent labs reviewed  Disc lifstyle change with low sodium diet and exercise  Plan to continue lisinopril hct 10-12.5 mg daily      Relevant Medications   atorvastatin (LIPITOR) 20 MG tablet   lisinopril-hydrochlorothiazide (ZESTORETIC) 10-12.5 MG tablet     Endocrine   Hypothyroidism    Lab Results  Component Value Date   TSH 6.42  (H) 01/13/2021   Pt notes no clinical change  She takes levothyroxine 50 mcg at night close to other meds and food  Will change to am at a least an hour sep from food/meds/supplements Re check in 6 wk       Relevant Medications   levothyroxine (SYNTHROID) 50 MCG tablet   Other Relevant Orders   TSH   Diabetes type 2, controlled ([place])    Lab Results  Component Value Date   HGBA1C 6.6 (H) 01/13/2021   Fairly well controlled  Plan to continue glipizide xl 10 mg daily, januvia 100 mg daily and metformin 1000 mg bid On ace and statin  Eye exam utd Good foot care F/u 40mo      Relevant Medications   atorvastatin (LIPITOR) 20 MG tablet   sitaGLIPtin (  JANUVIA) 100 MG tablet   metFORMIN (GLUCOPHAGE) 1000 MG tablet   lisinopril-hydrochlorothiazide (ZESTORETIC) 10-12.5 MG tablet   glipiZIDE (GLUCOTROL XL) 10 MG 24 hr tablet   Hyperlipidemia associated with type 2 diabetes mellitus ([facility])    Disc goals for lipids and reasons to control them Rev last labs with pt Rev low sat fat diet in detail LDL is in 90s with goal of 70 or below for DM2 Will inc atorvastatin to 20 mg daily  Re check at next labs in 6 wk       Relevant Medications   atorvastatin (LIPITOR) 20 MG tablet   sitaGLIPtin (JANUVIA) 100 MG tablet   metFORMIN (GLUCOPHAGE) 1000 MG tablet   lisinopril-hydrochlorothiazide (ZESTORETIC) 10-12.5 MG tablet   glipiZIDE (GLUCOTROL XL) 10 MG 24 hr tablet   Other Relevant Orders   Lipid panel     Musculoskeletal and Integument   History of melanoma in situ    No new problems  Uses sun protection  Sees dermatology routinely         Other   Routine general medical examination at a health care facility - Primary    Reviewed health habits including diet and exercise and skin cancer prevention Reviewed appropriate screening tests for age  Also reviewed health mt list, fam hx and immunization status , as well as social and family history   See HPI Labs reviewed  Rev amw  from 5/11 Mammogram utd Will plan dexa in another year -last one nl with no falls or fx  Colonoscopy utd  covid immunized Had shingrix vaccines         Severe obesity (BMI 35.0-35.9 with comorbidity) ([place])    Discussed how this problem influences overall health and the risks it imposes  Reviewed plan for weight loss with lower calorie diet (via better food choices and also portion control or program like weight watchers) and exercise building up to or more than 30 minutes 5 days per week including some aerobic activity         Relevant Medications   sitaGLIPtin (JANUVIA) 100 MG tablet   metFORMIN (GLUCOPHAGE) 1000 MG tablet   glipiZIDE (GLUCOTROL XL) 10 MG 24 hr tablet   Vitamin B 12 deficiency    Lab Results  Component Value Date   VITAMINB12 378 01/13/2021   Stable with current oral supplementation

## 2021-01-20 NOTE — Assessment & Plan Note (Signed)
Lab Results  Component Value Date   TSH 6.42 (H) 01/13/2021   Pt notes no clinical change  She takes levothyroxine 50 mcg at night close to other meds and food  Will change to am at a least an hour sep from food/meds/supplements Re check in 6 wk

## 2021-01-20 NOTE — Assessment & Plan Note (Signed)
Reviewed health habits including diet and exercise and skin cancer prevention Reviewed appropriate screening tests for age  Also reviewed health mt list, fam hx and immunization status , as well as social and family history   See HPI Labs reviewed  Rev amw from 5/11 Mammogram utd Will plan dexa in another year -last one nl with no falls or fx  Colonoscopy utd  covid immunized Had shingrix vaccines

## 2021-01-20 NOTE — Assessment & Plan Note (Signed)
No new problems  Uses sun protection  Sees dermatology routinely

## 2021-01-20 NOTE — Assessment & Plan Note (Signed)
Lab Results  Component Value Date   [id number] 366 01/13/2021   Stable with current oral supplementation

## 2021-01-20 NOTE — Patient Instructions (Addendum)
Avoid red meat/ fried foods/ egg yolks/ fatty breakfast meats/ butter, cheese and high fat dairy/ and shellfish    Aim for french fries no more than monthly   Change your thyroid medicine dosing  Take it at least 1 hour away from food, vitamins and other medicines (am tends to make that work the best)   We will re check that in 6 weeks   Take care of yourself !

## 2021-01-20 NOTE — Assessment & Plan Note (Signed)
Disc goals for lipids and reasons to control them Rev last labs with pt Rev low sat fat diet in detail LDL is in 90s with goal of 70 or below for DM2 Will inc atorvastatin to 20 mg daily  Re check at next labs in 6 wk

## 2021-01-20 NOTE — Assessment & Plan Note (Signed)
Discussed how this problem influences overall health and the risks it imposes  Reviewed plan for weight loss with lower calorie diet (via better food choices and also portion control or program like weight watchers) and exercise building up to or more than 30 minutes 5 days per week including some aerobic activity    

## 2021-01-20 NOTE — Assessment & Plan Note (Signed)
Lab Results  Component Value Date   HGBA1C 6.6 (H) 01/13/2021   Fairly well controlled  Plan to continue glipizide xl 10 mg daily, januvia 100 mg daily and metformin 1000 mg bid On ace and statin  Eye exam utd Good foot care F/u 65mo

## 2021-02-15 ENCOUNTER — Encounter: Payer: Self-pay | Admitting: Internal Medicine

## 2021-02-22 ENCOUNTER — Encounter: Payer: Self-pay | Admitting: Podiatry

## 2021-02-22 ENCOUNTER — Other Ambulatory Visit: Payer: Self-pay

## 2021-02-22 ENCOUNTER — Ambulatory Visit: Payer: Medicare PPO | Admitting: Podiatry

## 2021-02-22 DIAGNOSIS — D2371 Other benign neoplasm of skin of right lower limb, including hip: Secondary | ICD-10-CM | POA: Diagnosis not present

## 2021-02-22 NOTE — Progress Notes (Signed)
Subjective:  Patient ID: Brooke Mitchell, female    DOB: 08-Mar-1951,  MRN: [id number] HPI Chief Complaint  Patient presents with   Foot Pain    Plantar heel right - small, callused lesion x months, tender when walking, PCP advised checking and treatment   New Patient (Initial Visit)    Est pt 2017    70 y.o. female presents with the above complaint.   Denies fever chills nausea vomiting muscle aches pains calf pain back pain chest pain shortness of breath.  Past Medical History:  Diagnosis Date   Allergic rhinitis    Allergy    Anxiety    Arthritis    osteoarthritis   Asthma    As a child    Depression    Diabetes mellitus    Type II (2/06 elevated microalbumin)   Frequent UTI    with coital prohylaxis    GERD (gastroesophageal reflux disease)    Hyperlipidemia    Hypertension    Hypothyroidism    IDA (iron deficiency anemia)    Obesity    OSA (obstructive sleep apnea)    CPAP   Sleep apnea    wears c-pap   Past Surgical History:  Procedure Laterality Date   BELPHAROPTOSIS REPAIR     COLONOSCOPY     DILATION AND CURETTAGE OF UTERUS     ENDOMETRIAL BIOPSY     FOOT SURGERY     UPPER GASTROINTESTINAL ENDOSCOPY      Current Outpatient Medications:    acetaminophen (TYLENOL) 325 MG tablet, Take 650 mg by mouth as needed for pain., Disp: , Rfl:    albuterol (PROAIR HFA) 108 (90 Base) MCG/ACT inhaler, INHALE TWO PUFFS BY MOUTH UP TO EVERY 4 HOURS AS NEEDED FOR WHEEZING, Disp: 8.5 g, Rfl: 5   Ascorbic Acid (VITAMIN C) 100 MG tablet, Take 100 mg by mouth daily., Disp: , Rfl:    atorvastatin (LIPITOR) 20 MG tablet, Take 1 tablet (20 mg total) by mouth daily., Disp: 90 tablet, Rfl: 3   calcium carbonate (OS-CAL) 600 MG TABS tablet, Take 600 mg by mouth 2 (two) times daily with a meal., Disp: , Rfl:    Cholecalciferol 4000 UNITS CAPS, Take 4,000 Units by mouth daily. Pt takes 2 tablets of 2,000 units per day = 4,000 units a day, Disp: , Rfl:    Cyanocobalamin (VITAMIN  B-12 PO), Take 6 mcg by mouth daily., Disp: , Rfl:    Fluticasone-Salmeterol (ADVAIR DISKUS) 100-50 MCG/DOSE AEPB, USE 1 PUFF TWICE DAILY DURING ALLERGY SEASON, Disp: 180 each, Rfl: 3   glipiZIDE (GLUCOTROL XL) 10 MG 24 hr tablet, Take 1 tablet (10 mg total) by mouth daily., Disp: 90 tablet, Rfl: 3   glucose blood (ACCU-CHEK GUIDE) test strip, USE TO CHECK BLOOD SUGAR TWICE DAILY AS NEEDED, Disp: 100 strip, Rfl: 0   hyoscyamine (LEVSIN SL) 0.125 MG SL tablet, Place one tablet under tongue once a day as needed., Disp: 30 tablet, Rfl: 1   IRON, FERROUS GLUCONATE, PO, Take 1 tablet by mouth 2 (two) times daily. , Disp: , Rfl:    levothyroxine (SYNTHROID) 50 MCG tablet, TAKE ONE TABLET BY MOUTH DAILY, Disp: 90 tablet, Rfl: 3   lisinopril-hydrochlorothiazide (ZESTORETIC) 10-12.5 MG tablet, Take 1 tablet by mouth daily., Disp: 90 tablet, Rfl: 3   loratadine (CLARITIN) 10 MG tablet, Take 10 mg by mouth daily., Disp: , Rfl:    meloxicam (MOBIC) 15 MG tablet, TAKE ONE TABLET BY MOUTH DAILY WITH FOOD AS NEEDED, Disp:  90 tablet, Rfl: 1   metFORMIN (GLUCOPHAGE) 1000 MG tablet, TAKE ONE TABLET BY MOUTH TWICE A DAY WITH A MEAL, Disp: 180 tablet, Rfl: 3   multivitamin (THERAGRAN) per tablet, Take 1 tablet by mouth daily., Disp: , Rfl:    sitaGLIPtin (JANUVIA) 100 MG tablet, Take 1 tablet (100 mg total) by mouth daily., Disp: 90 tablet, Rfl: 3  No Known Allergies Review of Systems Objective:  There were no vitals filed for this visit.  General: Well developed, nourished, in no acute distress, alert and oriented x3   Dermatological: Skin is warm, dry and supple bilateral. Nails x 10 are well maintained; remaining integument appears unremarkable at this time. There are no open sores, no preulcerative lesions, no rash or signs of infection present.  She has a solitary porokeratosis just distal to the heel pad of the right foot at the medial longitudinal arch.  Sharply debrided today removing a large amount of the  nucleated lesion.  Vascular: Dorsalis Pedis artery and Posterior Tibial artery pedal pulses are 2/4 bilateral with immedate capillary fill time. Pedal hair growth present. No varicosities and no lower extremity edema present bilateral.   Neruologic: Grossly intact via light touch bilateral. Vibratory intact via tuning fork bilateral. Protective threshold with [place] Wienstein monofilament intact to all pedal sites bilateral. Patellar and Achilles deep tendon reflexes 2+ bilateral. No Babinski or clonus noted bilateral.   Musculoskeletal: No gross boney pedal deformities bilateral. No pain, crepitus, or limitation noted with foot and ankle range of motion bilateral. Muscular strength 5/5 in all groups tested bilateral.  Gait: Unassisted, Nonantalgic.    Radiographs:  None taken  Assessment & Plan:   Assessment: Porro keratoma  Plan: Mechanical and chemical debridement of the porokeratosis.  Salicylic acid was placed under occlusion which she will leave on for 3 days before getting wet.  She will wash it off at that time follow-up with me in 6 weeks if necessary.     Brooke Mitchell T. [place], [place]

## 2021-03-03 ENCOUNTER — Other Ambulatory Visit: Payer: Self-pay

## 2021-03-03 ENCOUNTER — Other Ambulatory Visit (INDEPENDENT_AMBULATORY_CARE_PROVIDER_SITE_OTHER): Payer: Medicare PPO

## 2021-03-03 DIAGNOSIS — E039 Hypothyroidism, unspecified: Secondary | ICD-10-CM

## 2021-03-03 DIAGNOSIS — E1169 Type 2 diabetes mellitus with other specified complication: Secondary | ICD-10-CM

## 2021-03-03 DIAGNOSIS — E785 Hyperlipidemia, unspecified: Secondary | ICD-10-CM | POA: Diagnosis not present

## 2021-03-03 LAB — LIPID PANEL
Cholesterol: 148 mg/dL (ref 0–200)
HDL: 40.6 mg/dL (ref >39.00)
LDL Cholesterol: 69 mg/dL (ref 0–99)
NonHDL: 107.57
Total CHOL/HDL Ratio: 4
Triglycerides: 194 mg/dL — ABNORMAL HIGH (ref 0.0–149.0)
VLDL: 38.8 mg/dL (ref 0.0–40.0)

## 2021-03-03 LAB — TSH: TSH: 5 u[IU]/mL — ABNORMAL HIGH (ref 0.35–4.50)

## 2021-03-04 ENCOUNTER — Telehealth: Payer: Self-pay | Admitting: *Deleted

## 2021-03-04 MED ORDER — LEVOTHYROXINE SODIUM 75 MCG PO TABS: 75.0000 ug | ORAL_TABLET | Freq: Every day | ORAL | 3 refills | Status: DC

## 2021-03-04 NOTE — Telephone Encounter (Signed)
Brooke Mitchell notified as instructed by telephone. Levothyroxine 75 mcg prescription sent to [name] as instructed by Dr. Glori Bickers.  Lab appointment scheduled for 04/16/2021 at 9:45 am to recheck TSH.

## 2021-03-04 NOTE — Telephone Encounter (Signed)
-----   Message from Abner Greenspan, MD sent at 03/03/2021  6:42 PM EDT ----- Cholesterol is improved -continue current medication  TSH is still mildly elevated Please inc levothyroxine to 75 mcg from 50  #30 3 ref Re check TSH in 6 wk

## 2021-03-11 DIAGNOSIS — G4733 Obstructive sleep apnea (adult) (pediatric): Secondary | ICD-10-CM | POA: Diagnosis not present

## 2021-04-15 ENCOUNTER — Telehealth: Payer: Self-pay | Admitting: Family Medicine

## 2021-04-15 DIAGNOSIS — E119 Type 2 diabetes mellitus without complications: Secondary | ICD-10-CM

## 2021-04-15 DIAGNOSIS — E039 Hypothyroidism, unspecified: Secondary | ICD-10-CM

## 2021-04-15 NOTE — Telephone Encounter (Signed)
-----   Message from Ellamae Sia sent at 03/29/2021  9:42 AM EDT ----- Regarding: Lab orders for Friday, 8.12.22 Thyroid orders,, ? Thanks, T

## 2021-04-16 ENCOUNTER — Other Ambulatory Visit: Payer: Self-pay

## 2021-04-16 ENCOUNTER — Other Ambulatory Visit (INDEPENDENT_AMBULATORY_CARE_PROVIDER_SITE_OTHER): Payer: Medicare PPO

## 2021-04-16 DIAGNOSIS — E119 Type 2 diabetes mellitus without complications: Secondary | ICD-10-CM

## 2021-04-16 DIAGNOSIS — E039 Hypothyroidism, unspecified: Secondary | ICD-10-CM

## 2021-04-16 LAB — TSH: TSH: 2.33 u[IU]/mL (ref 0.35–5.50)

## 2021-04-16 LAB — HEMOGLOBIN A1C: Hgb A1c MFr Bld: 6.2 % (ref 4.6–6.5)

## 2021-05-02 ENCOUNTER — Other Ambulatory Visit: Payer: Self-pay | Admitting: Family Medicine

## 2021-05-03 NOTE — Telephone Encounter (Signed)
F/u scheduled on 07/23/21, last filled on 11/04/20 #90 tabs with 1 refill, please advise

## 2021-05-13 ENCOUNTER — Encounter: Payer: Self-pay | Admitting: Family Medicine

## 2021-06-05 ENCOUNTER — Encounter: Payer: Self-pay | Admitting: Family Medicine

## 2021-06-08 ENCOUNTER — Other Ambulatory Visit: Payer: Self-pay | Admitting: Family Medicine

## 2021-06-08 DIAGNOSIS — Z1231 Encounter for screening mammogram for malignant neoplasm of breast: Secondary | ICD-10-CM

## 2021-06-10 DIAGNOSIS — G4733 Obstructive sleep apnea (adult) (pediatric): Secondary | ICD-10-CM | POA: Diagnosis not present

## 2021-06-28 ENCOUNTER — Other Ambulatory Visit: Payer: Self-pay | Admitting: Family Medicine

## 2021-07-11 ENCOUNTER — Encounter: Payer: Self-pay | Admitting: Family Medicine

## 2021-07-12 NOTE — Telephone Encounter (Signed)
Called patient we did not have anything open today. She only has mild symptoms at this time. If increase or change she will give [place] a call or go to urgent care/ ED for evaluation.  Virtual made for tomorrow at 2 pm.

## 2021-07-13 ENCOUNTER — Other Ambulatory Visit: Payer: Self-pay

## 2021-07-13 ENCOUNTER — Encounter: Payer: Self-pay | Admitting: Family Medicine

## 2021-07-13 ENCOUNTER — Telehealth (INDEPENDENT_AMBULATORY_CARE_PROVIDER_SITE_OTHER): Payer: Medicare PPO | Admitting: Family Medicine

## 2021-07-13 DIAGNOSIS — U071 COVID-19: Secondary | ICD-10-CM | POA: Insufficient documentation

## 2021-07-13 MED ORDER — FLUTICASONE-SALMETEROL 100-50 MCG/ACT IN AEPB: 1.0000 | INHALATION_SPRAY | Freq: Two times a day (BID) | RESPIRATORY_TRACT | 3 refills | Status: DC

## 2021-07-13 MED ORDER — MOLNUPIRAVIR EUA 200MG CAPSULE: 4.0000 | ORAL_CAPSULE | Freq: Two times a day (BID) | ORAL | 0 refills | Status: AC

## 2021-07-13 MED ORDER — ALBUTEROL SULFATE HFA 108 (90 BASE) MCG/ACT IN AERS: INHALATION_SPRAY | RESPIRATORY_TRACT | 5 refills | Status: DC

## 2021-07-13 NOTE — Progress Notes (Signed)
Virtual Visit via Video Note  I connected with Brooke Mitchell on 07/13/21 at  2:00 PM EST by a video enabled telemedicine application and verified that I am speaking with the correct person using two identifiers.  Location: Patient: home Provider: office    I discussed the limitations of evaluation and management by telemedicine and the availability of in person appointments. The patient expressed understanding and agreed to proceed.  Parties involved in encounter  Patient: Brooke Mitchell   Provider:  Loura Pardon MD   History of Present Illness: Pt presents with symptoms of covid 85 She has h/o diabetes Is 70 yo   Not overly sick   No fever No body aches or chills   No sore throat   No n/v/d   Positive test 11/6 Symptoms started 3 days ago  Interested in antiviral   She is covid immunized   Felt like she had a bad cold to start    Cough :is non productive  Little bit of wheezing but not sob  Nasal congestion   Does not feel like she needs prednisone   Needs refill of albuterol and advair -to [facility]    Today she feels better   Lab Results  Component Value Date   HGBA1C 6.2 04/16/2021   Lab Results  Component Value Date   CREATININE 0.92 01/13/2021   BUN 16 01/13/2021   NA 141 01/13/2021   K 3.9 01/13/2021   CL 102 01/13/2021   CO2 31 01/13/2021    Patient Active Problem List   Diagnosis Date Noted   COVID-19 07/13/2021   Cold hands and feet 10/08/2020   History of melanoma in situ 01/07/2018   Welcome to Medicare preventive visit 12/23/2016   Screening mammogram, encounter for 12/23/2016   Estrogen deficiency 12/23/2016   Neck pain 08/30/2016   Foot pain, left 12/23/2015   Urge incontinence 12/23/2015   Anemia, iron deficiency 02/19/2015   Vitamin B 12 deficiency 12/05/2014   Fatigue 11/26/2014   Grief reaction 01/22/2014   Colon cancer screening 10/24/2012   Severe obesity (BMI 35.0-35.9 with comorbidity) ([place]) 10/19/2011   Other  screening mammogram 04/13/2011   Routine general medical examination at a health care facility 04/07/2011   UTI'S, RECURRENT 02/03/2010   POSTMENOPAUSAL STATUS 03/28/2008   Hypothyroidism 11/24/2006   Diabetes type 2, controlled ([place]) 11/24/2006   Hyperlipidemia associated with type 2 diabetes mellitus ([place]) 11/24/2006   RESTLESS LEG SYNDROME 11/24/2006   Essential hypertension 11/24/2006   PREMATURE VENTRICULAR CONTRACTIONS 11/24/2006   HEMORRHOIDS 11/24/2006   ALLERGIC RHINITIS 11/24/2006   GERD 11/24/2006   HIATAL HERNIA 11/24/2006   OVERACTIVE BLADDER 11/24/2006   [place] DISEASE, CERVICAL 11/24/2006   Sleep apnea 11/24/2006   Past Medical History:  Diagnosis Date   Allergic rhinitis    Allergy    Anxiety    Arthritis    osteoarthritis   Asthma    As a child    Depression    Diabetes mellitus    Type II (2/06 elevated microalbumin)   Frequent UTI    with coital prohylaxis    GERD (gastroesophageal reflux disease)    Hyperlipidemia    Hypertension    Hypothyroidism    IDA (iron deficiency anemia)    Obesity    OSA (obstructive sleep apnea)    CPAP   Sleep apnea    wears c-pap   Past Surgical History:  Procedure Laterality Date   BELPHAROPTOSIS REPAIR     COLONOSCOPY  DILATION AND CURETTAGE OF UTERUS     ENDOMETRIAL BIOPSY     FOOT SURGERY     UPPER GASTROINTESTINAL ENDOSCOPY     Social History   Tobacco Use   Smoking status: Never   Smokeless tobacco: Never  Vaping Use   Vaping Use: Never used  Substance Use Topics   Alcohol use: Yes    Alcohol/week: 0.0 standard drinks    Comment: occasional   Drug use: No   Family History  Problem Relation Age of Onset   Diabetes Mother    Coronary artery disease Mother    Hypothyroidism Mother    Irritable bowel syndrome Mother    Alzheimer's disease Father    Nephrolithiasis Father    Hypothyroidism Brother    Breast cancer Paternal Grandmother 44   Colon cancer Neg Hx    Esophageal cancer Neg Hx     Rectal cancer Neg Hx    Stomach cancer Neg Hx    No Known Allergies [facility] Medications on File Prior to Visit  Medication Sig Dispense Refill   acetaminophen (TYLENOL) 325 MG tablet Take 650 mg by mouth as needed for pain.     Ascorbic Acid (VITAMIN C) 100 MG tablet Take 100 mg by mouth daily.     atorvastatin (LIPITOR) 20 MG tablet Take 1 tablet (20 mg total) by mouth daily. 90 tablet 3   calcium carbonate (OS-CAL) 600 MG TABS tablet Take 600 mg by mouth 2 (two) times daily with a meal.     Cholecalciferol 4000 UNITS CAPS Take 4,000 Units by mouth daily. Pt takes 2 tablets of 2,000 units per day = 4,000 units a day     Cyanocobalamin (VITAMIN B-12 PO) Take 6 mcg by mouth daily.     Fluticasone-Salmeterol (ADVAIR DISKUS) 100-50 MCG/DOSE AEPB USE 1 PUFF TWICE DAILY DURING ALLERGY SEASON 180 each 3   glipiZIDE (GLUCOTROL XL) 10 MG 24 hr tablet Take 1 tablet (10 mg total) by mouth daily. 90 tablet 3   glucose blood (ACCU-CHEK GUIDE) test strip USE TO CHECK BLOOD SUGAR TWICE DAILY AS NEEDED 100 strip 0   hyoscyamine (LEVSIN SL) 0.125 MG SL tablet Place one tablet under tongue once a day as needed. 30 tablet 1   IRON, FERROUS GLUCONATE, PO Take 1 tablet by mouth 2 (two) times daily.      levothyroxine (SYNTHROID) 75 MCG tablet TAKE ONE TABLET BY MOUTH DAILY 90 tablet 1   lisinopril-hydrochlorothiazide (ZESTORETIC) 10-12.5 MG tablet Take 1 tablet by mouth daily. 90 tablet 3   loratadine (CLARITIN) 10 MG tablet Take 10 mg by mouth daily.     meloxicam (MOBIC) 15 MG tablet TAKE ONE TABLET BY MOUTH ONCE DAILY WITH FOOD AS NEEDED 90 tablet 1   metFORMIN (GLUCOPHAGE) 1000 MG tablet TAKE ONE TABLET BY MOUTH TWICE A DAY WITH A MEAL 180 tablet 3   multivitamin (THERAGRAN) per tablet Take 1 tablet by mouth daily.     sitaGLIPtin (JANUVIA) 100 MG tablet Take 1 tablet (100 mg total) by mouth daily. 90 tablet 3   No [facility] facility-administered medications on file prior to visit.   Review of  Systems  Constitutional:  Negative for chills, fever and malaise/fatigue.  HENT:  Positive for congestion. Negative for ear pain, sinus pain and sore throat.   Eyes:  Negative for blurred vision, discharge and redness.  Respiratory:  Positive for cough and wheezing. Negative for sputum production, shortness of breath and stridor.   Cardiovascular:  Negative for  chest pain, palpitations and leg swelling.  Gastrointestinal:  Negative for abdominal pain, diarrhea, nausea and vomiting.  Musculoskeletal:  Negative for myalgias.  Skin:  Negative for rash.  Neurological:  Negative for dizziness and headaches.     Observations/Objective: Patient appears well, in no distress Weight is baseline  No facial swelling or asymmetry Normal voice-not hoarse and no slurred speech No obvious tremor or mobility impairment Moving neck and UEs normally Able to hear the call well  No cough or shortness of breath during interview  Talkative and mentally sharp with no cognitive changes No skin changes on face or neck , no rash or pallor Affect is normal    Assessment and Plan: Problem List Items Addressed This Visit       Other   COVID-19    Day 3 of relatively mild symptoms  In light of age and h/o DM , choose to treat with antiviral  molnupiravir sent to pharmacy, we discussed potential side effects Refilled advair and albuterol  Will consider prednisone if wheezing worsens or persists  Disc symptom tx  ER parameters reviewed  Update if not starting to improve in a week or if worsening        Relevant Medications   molnupiravir EUA (LAGEVRIO) 200 mg CAPS capsule     Follow Up Instructions: Drink fluids and rest  Isolate through 5 days (but longer if you have symptoms) and wear a mask when you return   Take the molnupiravir as directed  If side effects let us know  I sent in albuterol and advair as well   Treat your symptoms If wheezing worsens please call and let us know   If any  severe symptoms please go to the ER    I discussed the assessment and treatment plan with the patient. The patient was provided an opportunity to ask questions and all were answered. The patient agreed with the plan and demonstrated an understanding of the instructions.   The patient was advised to call back or seek an in-person evaluation if the symptoms worsen or if the condition fails to improve as anticipated.     Loura Pardon, MD

## 2021-07-13 NOTE — Patient Instructions (Signed)
Drink fluids and rest  Isolate through 5 days (but longer if you have symptoms) and wear a mask when you return   Take the molnupiravir as directed  If side effects let us know  I sent in albuterol and advair as well   Treat your symptoms If wheezing worsens please call and let us know   If any severe symptoms please go to the ER

## 2021-07-13 NOTE — Assessment & Plan Note (Signed)
Day 3 of relatively mild symptoms  In light of age and h/o DM , choose to treat with antiviral  molnupiravir sent to pharmacy, we discussed potential side effects Refilled advair and albuterol  Will consider prednisone if wheezing worsens or persists  Disc symptom tx  ER parameters reviewed  Update if not starting to improve in a week or if worsening

## 2021-07-16 ENCOUNTER — Other Ambulatory Visit: Payer: Medicare PPO

## 2021-07-23 ENCOUNTER — Other Ambulatory Visit: Payer: Self-pay

## 2021-07-23 ENCOUNTER — Ambulatory Visit: Payer: Medicare PPO | Admitting: Family Medicine

## 2021-07-23 ENCOUNTER — Encounter: Payer: Self-pay | Admitting: Family Medicine

## 2021-07-23 VITALS — BP 134/78 | HR 76 | Temp 97.7°F | Ht 64.0 in | Wt 202.1 lb

## 2021-07-23 DIAGNOSIS — E119 Type 2 diabetes mellitus without complications: Secondary | ICD-10-CM

## 2021-07-23 DIAGNOSIS — I10 Essential (primary) hypertension: Secondary | ICD-10-CM | POA: Diagnosis not present

## 2021-07-23 DIAGNOSIS — E039 Hypothyroidism, unspecified: Secondary | ICD-10-CM | POA: Diagnosis not present

## 2021-07-23 DIAGNOSIS — E785 Hyperlipidemia, unspecified: Secondary | ICD-10-CM

## 2021-07-23 DIAGNOSIS — E1169 Type 2 diabetes mellitus with other specified complication: Secondary | ICD-10-CM | POA: Diagnosis not present

## 2021-07-23 LAB — LIPID PANEL
Cholesterol: 157 mg/dL (ref 0–200)
HDL: 43.7 mg/dL (ref >39.00)
NonHDL: 113.71
Total CHOL/HDL Ratio: 4
Triglycerides: 208 mg/dL — ABNORMAL HIGH (ref 0.0–149.0)
VLDL: 41.6 mg/dL — ABNORMAL HIGH (ref 0.0–40.0)

## 2021-07-23 LAB — COMPREHENSIVE METABOLIC PANEL
ALT: 13 U/L (ref 0–35)
AST: 16 U/L (ref 0–37)
Albumin: 4.1 g/dL (ref 3.5–5.2)
Alkaline Phosphatase: 44 U/L (ref 39–117)
BUN: 19 mg/dL (ref 6–23)
CO2: 29 mEq/L (ref 19–32)
Calcium: 9.3 mg/dL (ref 8.4–10.5)
Chloride: 104 mEq/L (ref 96–112)
Creatinine, Ser: 0.92 mg/dL (ref 0.40–1.20)
GFR: 63.1 mL/min (ref >60.00)
Glucose, Bld: 125 mg/dL — ABNORMAL HIGH (ref 70–99)
Potassium: 3.9 mEq/L (ref 3.5–5.1)
Sodium: 140 mEq/L (ref 135–145)
Total Bilirubin: 0.5 mg/dL (ref 0.2–1.2)
Total Protein: 6.6 g/dL (ref 6.0–8.3)

## 2021-07-23 LAB — HEMOGLOBIN A1C: Hgb A1c MFr Bld: 6.4 % (ref 4.6–6.5)

## 2021-07-23 LAB — TSH: TSH: 1.66 u[IU]/mL (ref 0.35–5.50)

## 2021-07-23 LAB — LDL CHOLESTEROL, DIRECT: Direct LDL: 92 mg/dL

## 2021-07-23 NOTE — Assessment & Plan Note (Signed)
TSH today  Taking levothyroxine 75 mcg correctly  No clinical changes Will adj if needed

## 2021-07-23 NOTE — Assessment & Plan Note (Addendum)
bp in fair control at this time  BP Readings from Last 1 Encounters:  07/23/21 134/78   No changes needed Most recent labs reviewed  Disc lifstyle change with low sodium diet and exercise  Plan to continue lisinopril hct 10-12.5 one daily Labs today

## 2021-07-23 NOTE — Assessment & Plan Note (Signed)
Disc goals for lipids and reasons to control them Rev last labs with pt Rev low sat fat diet in detail Has been fairly controlled with atorvastatin 20 mg daily  LDL of 69

## 2021-07-23 NOTE — Assessment & Plan Note (Signed)
A1C pending  Eating better  Good glucose readings Disc a protein snack at bedtime if needed for low am glucose  Enc wt loss and low glycemic diet  Plan to continue Januvia 100 mg daily  Glipizide xl 10 mg daily  Metformin 1000 mg bid  Sent for last eye exam report  Takes ace and statin

## 2021-07-23 NOTE — Patient Instructions (Addendum)
If glucose readings are low in am - then add a small snack with protein at bedtime  Stay active  Stick to low glycemic diet  Try to get most of your carbohydrates from produce (with the exception of white potatoes)  Eat less bread/pasta/rice/snack foods/cereals/sweets and other items from the middle of the grocery store (processed carbs)  Take care of yourself   Labs today

## 2021-07-23 NOTE — Progress Notes (Signed)
Subjective:    Patient ID: Brooke Mitchell, female    DOB: [birth year], 70 y.o.   MRN: [id number]  This visit occurred during the SARS-CoV-2 public health emergency.  Safety protocols were in place, including screening questions prior to the visit, additional usage of staff PPE, and extensive cleaning of exam room while observing appropriate contact time as indicated for disinfecting solutions.   HPI Pt presents for f/u of DM2 and chronic medical problems   Wt Readings from Last 3 Encounters:  07/23/21 202 lb 2 oz (91.7 kg)  07/13/21 210 lb (95.3 kg)  01/20/21 210 lb 2 oz (95.3 kg)   34.69 kg/m  Doing well  Lost 8 lb  Trying to eat better overall   Wants to break 200 lb   Walking for exercise  Makes the effort to walk instead of ride any time   bp is stable today  No cp or palpitations or headaches or edema  No side effects to medicines  BP Readings from Last 3 Encounters:  07/23/21 134/78  01/20/21 118/78  10/08/20 122/68    Lisinopril hct 10-12.5 mg daily   Hypothyroidism  Pt has no clinical changes No change in energy level/ hair or skin/ edema and no tremor Lab Results  Component Value Date   TSH 2.33 04/16/2021    Levothyroxine 75 mcg daily  She thinks she is evened out now  Takes it correctly   DM2 Lab Results  Component Value Date   HGBA1C 6.2 04/16/2021   Improved last time  Eating better  2 mornings glucose was near 80 Felt ok Walking  Januvia 100 mg daily  Glipizide xl 10 mg daily  Metformin 1000 mg bid   Eye exam : usually goes in the spring and f/u in the fall  Will send for her report No change in vision   Had her flu shot   Hyperlipidemia  Lab Results  Component Value Date   CHOL 148 03/03/2021   HDL 40.60 03/03/2021   LDLCALC 69 03/03/2021   LDLDIRECT 85.0 05/22/2018   TRIG 194.0 (H) 03/03/2021   CHOLHDL 4 03/03/2021   Atorvastatin 20 mg daily   Patient Active Problem List   Diagnosis Date Noted   COVID-19 07/13/2021    Cold hands and feet 10/08/2020   History of melanoma in situ 01/07/2018   Welcome to Medicare preventive visit 12/23/2016   Screening mammogram, encounter for 12/23/2016   Estrogen deficiency 12/23/2016   Neck pain 08/30/2016   Foot pain, left 12/23/2015   Urge incontinence 12/23/2015   Anemia, iron deficiency 02/19/2015   Vitamin B 12 deficiency 12/05/2014   Fatigue 11/26/2014   Grief reaction 01/22/2014   Colon cancer screening 10/24/2012   Severe obesity (BMI 35.0-35.9 with comorbidity) ([place]) 10/19/2011   Other screening mammogram 04/13/2011   Routine general medical examination at a health care facility 04/07/2011   UTI'S, RECURRENT 02/03/2010   POSTMENOPAUSAL STATUS 03/28/2008   Hypothyroidism 11/24/2006   Diabetes type 2, controlled ([place]) 11/24/2006   Hyperlipidemia associated with type 2 diabetes mellitus ([place]) 11/24/2006   RESTLESS LEG SYNDROME 11/24/2006   Essential hypertension 11/24/2006   PREMATURE VENTRICULAR CONTRACTIONS 11/24/2006   HEMORRHOIDS 11/24/2006   ALLERGIC RHINITIS 11/24/2006   GERD 11/24/2006   HIATAL HERNIA 11/24/2006   OVERACTIVE BLADDER 11/24/2006   [place] DISEASE, CERVICAL 11/24/2006   Sleep apnea 11/24/2006   Past Medical History:  Diagnosis Date   Allergic rhinitis    Allergy    Anxiety  Arthritis    osteoarthritis   Asthma    As a child    Depression    Diabetes mellitus    Type II (2/06 elevated microalbumin)   Frequent UTI    with coital prohylaxis    GERD (gastroesophageal reflux disease)    Hyperlipidemia    Hypertension    Hypothyroidism    IDA (iron deficiency anemia)    Obesity    OSA (obstructive sleep apnea)    CPAP   Sleep apnea    wears c-pap   Past Surgical History:  Procedure Laterality Date   BELPHAROPTOSIS REPAIR     COLONOSCOPY     DILATION AND CURETTAGE OF UTERUS     ENDOMETRIAL BIOPSY     FOOT SURGERY     UPPER GASTROINTESTINAL ENDOSCOPY     Social History   Tobacco Use   Smoking status: Never    Smokeless tobacco: Never  Vaping Use   Vaping Use: Never used  Substance Use Topics   Alcohol use: Yes    Alcohol/week: 0.0 standard drinks    Comment: occasional   Drug use: No   Family History  Problem Relation Age of Onset   Diabetes Mother    Coronary artery disease Mother    Hypothyroidism Mother    Irritable bowel syndrome Mother    Alzheimer's disease Father    Nephrolithiasis Father    Hypothyroidism Brother    Breast cancer Paternal Grandmother 38   Colon cancer Neg Hx    Esophageal cancer Neg Hx    Rectal cancer Neg Hx    Stomach cancer Neg Hx    No Known Allergies Current Outpatient Medications on File Prior to Visit  Medication Sig Dispense Refill   acetaminophen (TYLENOL) 325 MG tablet Take 650 mg by mouth as needed for pain.     albuterol (PROAIR HFA) 108 (90 Base) MCG/ACT inhaler INHALE TWO PUFFS BY MOUTH UP TO EVERY 4 HOURS AS NEEDED FOR WHEEZING 8.5 g 5   Ascorbic Acid (VITAMIN C) 100 MG tablet Take 100 mg by mouth daily.     atorvastatin (LIPITOR) 20 MG tablet Take 1 tablet (20 mg total) by mouth daily. 90 tablet 3   calcium carbonate (OS-CAL) 600 MG TABS tablet Take 600 mg by mouth 2 (two) times daily with a meal.     Cholecalciferol 4000 UNITS CAPS Take 4,000 Units by mouth daily. Pt takes 2 tablets of 2,000 units per day = 4,000 units a day     Cyanocobalamin (VITAMIN B-12 PO) Take 6 mcg by mouth daily.     fluticasone-salmeterol (ADVAIR) 100-50 MCG/ACT AEPB Inhale 1 puff into the lungs 2 (two) times daily. 1 each 3   glipiZIDE (GLUCOTROL XL) 10 MG 24 hr tablet Take 1 tablet (10 mg total) by mouth daily. 90 tablet 3   glucose blood (ACCU-CHEK GUIDE) test strip USE TO CHECK BLOOD SUGAR TWICE DAILY AS NEEDED 100 strip 0   hyoscyamine (LEVSIN SL) 0.125 MG SL tablet Place one tablet under tongue once a day as needed. 30 tablet 1   IRON, FERROUS GLUCONATE, PO Take 1 tablet by mouth 2 (two) times daily.      levothyroxine (SYNTHROID) 75 MCG tablet TAKE ONE TABLET  BY MOUTH DAILY 90 tablet 1   lisinopril-hydrochlorothiazide (ZESTORETIC) 10-12.5 MG tablet Take 1 tablet by mouth daily. 90 tablet 3   loratadine (CLARITIN) 10 MG tablet Take 10 mg by mouth daily.     meloxicam (MOBIC) 15 MG tablet TAKE  ONE TABLET BY MOUTH ONCE DAILY WITH FOOD AS NEEDED 90 tablet 1   metFORMIN (GLUCOPHAGE) 1000 MG tablet TAKE ONE TABLET BY MOUTH TWICE A DAY WITH A MEAL 180 tablet 3   multivitamin (THERAGRAN) per tablet Take 1 tablet by mouth daily.     sitaGLIPtin (JANUVIA) 100 MG tablet Take 1 tablet (100 mg total) by mouth daily. 90 tablet 3   No current facility-administered medications on file prior to visit.     Review of Systems  Constitutional:  Negative for activity change, appetite change, fatigue, fever and unexpected weight change.  HENT:  Negative for congestion, ear pain, rhinorrhea, sinus pressure and sore throat.   Eyes:  Negative for pain, redness and visual disturbance.  Respiratory:  Negative for cough, shortness of breath and wheezing.   Cardiovascular:  Negative for chest pain and palpitations.  Gastrointestinal:  Negative for abdominal pain, blood in stool, constipation and diarrhea.  Endocrine: Negative for polydipsia and polyuria.  Genitourinary:  Negative for dysuria, frequency and urgency.  Musculoskeletal:  Negative for arthralgias, back pain and myalgias.  Skin:  Negative for pallor and rash.  Allergic/Immunologic: Negative for environmental allergies.  Neurological:  Negative for dizziness, syncope and headaches.  Hematological:  Negative for adenopathy. Does not bruise/bleed easily.  Psychiatric/Behavioral:  Negative for decreased concentration and dysphoric mood. The patient is not nervous/anxious.       Objective:   Physical Exam Constitutional:      General: She is not in acute distress.    Appearance: Normal appearance. She is well-developed. She is obese. She is not ill-appearing or diaphoretic.  HENT:     Head: Normocephalic and  atraumatic.  Eyes:     Conjunctiva/sclera: Conjunctivae normal.     Pupils: Pupils are equal, round, and reactive to light.  Neck:     Thyroid: No thyromegaly.     Vascular: No carotid bruit or JVD.  Cardiovascular:     Rate and Rhythm: Normal rate and regular rhythm.     Heart sounds: Normal heart sounds.    No gallop.  Pulmonary:     Effort: Pulmonary effort is normal. No respiratory distress.     Breath sounds: Normal breath sounds. No wheezing or rales.  Abdominal:     General: Bowel sounds are normal. There is no distension or abdominal bruit.     Palpations: Abdomen is soft. There is no mass.     Tenderness: There is no abdominal tenderness.  Musculoskeletal:     Cervical back: Normal range of motion and neck supple.     Right lower leg: No edema.     Left lower leg: No edema.  Lymphadenopathy:     Cervical: No cervical adenopathy.  Skin:    General: Skin is warm and dry.     Coloration: Skin is not pale.     Findings: No rash.  Neurological:     Mental Status: She is alert.     Coordination: Coordination normal.     Deep Tendon Reflexes: Reflexes are normal and symmetric. Reflexes normal.  Psychiatric:        Mood and Affect: Mood normal.          Assessment & Plan:   Problem List Items Addressed This Visit       Cardiovascular and Mediastinum   Essential hypertension    bp in fair control at this time  BP Readings from Last 1 Encounters:  07/23/21 134/78  No changes needed Most recent labs reviewed  Disc lifstyle change with low sodium diet and exercise  Plan to continue lisinopril hct 10-12.5 one daily Labs today      Relevant Orders   Comprehensive metabolic panel   TSH   Lipid panel     Endocrine   Hypothyroidism    TSH today  Taking levothyroxine 75 mcg correctly  No clinical changes Will adj if needed      Relevant Orders   TSH   Diabetes type 2, controlled ([place]) - Primary    A1C pending  Eating better  Good glucose  readings Disc a protein snack at bedtime if needed for low am glucose  Enc wt loss and low glycemic diet  Plan to continue Januvia 100 mg daily  Glipizide xl 10 mg daily  Metformin 1000 mg bid  Sent for last eye exam report  Takes ace and statin       Relevant Orders   Hemoglobin A1c   Hyperlipidemia associated with type 2 diabetes mellitus ([place])    Disc goals for lipids and reasons to control them Rev last labs with pt Rev low sat fat diet in detail Has been fairly controlled with atorvastatin 20 mg daily  LDL of 69       Relevant Orders   Lipid panel

## 2021-07-27 ENCOUNTER — Ambulatory Visit
Admission: RE | Admit: 2021-07-27 | Discharge: 2021-07-27 | Disposition: A | Discharge status: Home / Self Care | Payer: Medicare PPO | Source: Ambulatory Visit | Attending: Family Medicine | Admitting: Family Medicine

## 2021-07-27 ENCOUNTER — Other Ambulatory Visit: Payer: Self-pay

## 2021-07-27 DIAGNOSIS — Z1231 Encounter for screening mammogram for malignant neoplasm of breast: Secondary | ICD-10-CM | POA: Insufficient documentation

## 2021-07-28 ENCOUNTER — Other Ambulatory Visit: Payer: Self-pay | Admitting: Family Medicine

## 2021-07-28 DIAGNOSIS — N6489 Other specified disorders of breast: Secondary | ICD-10-CM

## 2021-07-28 DIAGNOSIS — R928 Other abnormal and inconclusive findings on diagnostic imaging of breast: Secondary | ICD-10-CM

## 2021-08-12 ENCOUNTER — Ambulatory Visit
Admission: RE | Admit: 2021-08-12 | Discharge: 2021-08-12 | Disposition: A | Discharge status: Home / Self Care | Payer: Medicare PPO | Source: Ambulatory Visit | Attending: Family Medicine | Admitting: Family Medicine

## 2021-08-12 ENCOUNTER — Other Ambulatory Visit: Payer: Self-pay

## 2021-08-12 DIAGNOSIS — N6489 Other specified disorders of breast: Secondary | ICD-10-CM

## 2021-08-12 DIAGNOSIS — R928 Other abnormal and inconclusive findings on diagnostic imaging of breast: Secondary | ICD-10-CM

## 2021-08-12 DIAGNOSIS — R922 Inconclusive mammogram: Secondary | ICD-10-CM | POA: Diagnosis not present

## 2021-09-21 DIAGNOSIS — H02839 Dermatochalasis of unspecified eye, unspecified eyelid: Secondary | ICD-10-CM | POA: Diagnosis not present

## 2021-09-21 DIAGNOSIS — E119 Type 2 diabetes mellitus without complications: Secondary | ICD-10-CM | POA: Diagnosis not present

## 2021-09-21 DIAGNOSIS — H2513 Age-related nuclear cataract, bilateral: Secondary | ICD-10-CM | POA: Diagnosis not present

## 2021-09-21 DIAGNOSIS — H40013 Open angle with borderline findings, low risk, bilateral: Secondary | ICD-10-CM | POA: Diagnosis not present

## 2021-09-21 DIAGNOSIS — H5203 Hypermetropia, bilateral: Secondary | ICD-10-CM | POA: Diagnosis not present

## 2021-09-21 DIAGNOSIS — H04129 Dry eye syndrome of unspecified lacrimal gland: Secondary | ICD-10-CM | POA: Diagnosis not present

## 2021-09-24 ENCOUNTER — Other Ambulatory Visit: Payer: Self-pay | Admitting: Family Medicine

## 2021-11-01 ENCOUNTER — Other Ambulatory Visit: Payer: Self-pay | Admitting: Family Medicine

## 2021-11-01 NOTE — Telephone Encounter (Signed)
Last OV was a f/u on 07/23/21, last filled on 05/03/21 #90 tabs with 1 refills

## 2021-12-24 ENCOUNTER — Other Ambulatory Visit: Payer: Self-pay | Admitting: Family Medicine

## 2022-01-14 ENCOUNTER — Ambulatory Visit: Payer: Medicare PPO

## 2022-01-16 ENCOUNTER — Telehealth: Payer: Self-pay | Admitting: Family Medicine

## 2022-01-16 DIAGNOSIS — E039 Hypothyroidism, unspecified: Secondary | ICD-10-CM

## 2022-01-16 DIAGNOSIS — E538 Deficiency of other specified B group vitamins: Secondary | ICD-10-CM

## 2022-01-16 DIAGNOSIS — I10 Essential (primary) hypertension: Secondary | ICD-10-CM

## 2022-01-16 DIAGNOSIS — E119 Type 2 diabetes mellitus without complications: Secondary | ICD-10-CM

## 2022-01-16 DIAGNOSIS — E1169 Type 2 diabetes mellitus with other specified complication: Secondary | ICD-10-CM

## 2022-01-16 NOTE — Telephone Encounter (Signed)
-----   Message from Velna Hatchet, RT sent at 01/03/2022  3:41 PM EDT ----- ?Regarding: Lab Mon 01/17/22 ?Lab order needed for AWV visit, please.  Thanks,  Anda Kraft ? ?

## 2022-01-17 ENCOUNTER — Telehealth: Payer: Self-pay

## 2022-01-17 ENCOUNTER — Ambulatory Visit: Payer: Medicare PPO

## 2022-01-17 ENCOUNTER — Other Ambulatory Visit (INDEPENDENT_AMBULATORY_CARE_PROVIDER_SITE_OTHER): Payer: Medicare PPO

## 2022-01-17 DIAGNOSIS — E538 Deficiency of other specified B group vitamins: Secondary | ICD-10-CM

## 2022-01-17 DIAGNOSIS — E785 Hyperlipidemia, unspecified: Secondary | ICD-10-CM

## 2022-01-17 DIAGNOSIS — E039 Hypothyroidism, unspecified: Secondary | ICD-10-CM

## 2022-01-17 DIAGNOSIS — I10 Essential (primary) hypertension: Secondary | ICD-10-CM | POA: Diagnosis not present

## 2022-01-17 DIAGNOSIS — E1169 Type 2 diabetes mellitus with other specified complication: Secondary | ICD-10-CM | POA: Diagnosis not present

## 2022-01-17 DIAGNOSIS — E119 Type 2 diabetes mellitus without complications: Secondary | ICD-10-CM

## 2022-01-17 LAB — CBC WITH DIFFERENTIAL/PLATELET
Basophils Absolute: 0 10*3/uL (ref 0.0–0.1)
Basophils Relative: 0.4 % (ref 0.0–3.0)
Eosinophils Absolute: 0.1 10*3/uL (ref 0.0–0.7)
Eosinophils Relative: 1.6 % (ref 0.0–5.0)
HCT: 38.1 % (ref 36.0–46.0)
Hemoglobin: 12.9 g/dL (ref 12.0–15.0)
Lymphocytes Relative: 22.6 % (ref 12.0–46.0)
Lymphs Abs: 1.6 10*3/uL (ref 0.7–4.0)
MCHC: 33.9 g/dL (ref 30.0–36.0)
MCV: 89.9 fl (ref 78.0–100.0)
Monocytes Absolute: 0.4 10*3/uL (ref 0.1–1.0)
Monocytes Relative: 5.4 % (ref 3.0–12.0)
Neutro Abs: 5.1 10*3/uL (ref 1.4–7.7)
Neutrophils Relative %: 70 % (ref 43.0–77.0)
Platelets: 246 10*3/uL (ref 150.0–400.0)
RBC: 4.24 Mil/uL (ref 3.87–5.11)
RDW: 12.8 % (ref 11.5–15.5)
WBC: 7.3 10*3/uL (ref 4.0–10.5)

## 2022-01-17 LAB — COMPREHENSIVE METABOLIC PANEL
ALT: 15 U/L (ref 0–35)
AST: 12 U/L (ref 0–37)
Albumin: 4.1 g/dL (ref 3.5–5.2)
Alkaline Phosphatase: 39 U/L (ref 39–117)
BUN: 18 mg/dL (ref 6–23)
CO2: 27 mEq/L (ref 19–32)
Calcium: 9.3 mg/dL (ref 8.4–10.5)
Chloride: 105 mEq/L (ref 96–112)
Creatinine, Ser: 0.95 mg/dL (ref 0.40–1.20)
GFR: 60.51 mL/min (ref >60.00)
Glucose, Bld: 116 mg/dL — ABNORMAL HIGH (ref 70–99)
Potassium: 3.8 mEq/L (ref 3.5–5.1)
Sodium: 142 mEq/L (ref 135–145)
Total Bilirubin: 0.4 mg/dL (ref 0.2–1.2)
Total Protein: 6.2 g/dL (ref 6.0–8.3)

## 2022-01-17 LAB — LIPID PANEL
Cholesterol: 166 mg/dL (ref 0–200)
HDL: 47.6 mg/dL (ref >39.00)
NonHDL: 118
Total CHOL/HDL Ratio: 3
Triglycerides: 220 mg/dL — ABNORMAL HIGH (ref 0.0–149.0)
VLDL: 44 mg/dL — ABNORMAL HIGH (ref 0.0–40.0)

## 2022-01-17 LAB — TSH: TSH: 1.71 u[IU]/mL (ref 0.35–5.50)

## 2022-01-17 LAB — HEMOGLOBIN A1C: Hgb A1c MFr Bld: 6.3 % (ref 4.6–6.5)

## 2022-01-17 LAB — LDL CHOLESTEROL, DIRECT: Direct LDL: 99 mg/dL

## 2022-01-17 LAB — VITAMIN B12: Vitamin B-12: >1504 pg/mL — ABNORMAL HIGH (ref 211–911)

## 2022-01-17 NOTE — Telephone Encounter (Signed)
This nurse called three times for telephonic AWV. Message left that we will call back to reschedule for another time. ?

## 2022-01-18 LAB — MICROALBUMIN / CREATININE URINE RATIO
Creatinine,U: 45.5 mg/dL
Microalb Creat Ratio: 2 mg/g (ref 0.0–30.0)
Microalb, Ur: 0.9 mg/dL (ref 0.0–1.9)

## 2022-01-23 ENCOUNTER — Other Ambulatory Visit: Payer: Self-pay | Admitting: Family Medicine

## 2022-01-24 ENCOUNTER — Encounter: Payer: Self-pay | Admitting: Family Medicine

## 2022-01-24 ENCOUNTER — Ambulatory Visit (INDEPENDENT_AMBULATORY_CARE_PROVIDER_SITE_OTHER): Payer: Medicare PPO | Admitting: Family Medicine

## 2022-01-24 VITALS — BP 118/66 | HR 86 | Temp 98.0°F | Ht 63.75 in | Wt 201.5 lb

## 2022-01-24 DIAGNOSIS — E785 Hyperlipidemia, unspecified: Secondary | ICD-10-CM | POA: Diagnosis not present

## 2022-01-24 DIAGNOSIS — E1169 Type 2 diabetes mellitus with other specified complication: Secondary | ICD-10-CM | POA: Diagnosis not present

## 2022-01-24 DIAGNOSIS — E039 Hypothyroidism, unspecified: Secondary | ICD-10-CM

## 2022-01-24 DIAGNOSIS — E2839 Other primary ovarian failure: Secondary | ICD-10-CM

## 2022-01-24 DIAGNOSIS — Z0000 Encounter for general adult medical examination without abnormal findings: Secondary | ICD-10-CM | POA: Diagnosis not present

## 2022-01-24 DIAGNOSIS — E538 Deficiency of other specified B group vitamins: Secondary | ICD-10-CM | POA: Diagnosis not present

## 2022-01-24 DIAGNOSIS — E119 Type 2 diabetes mellitus without complications: Secondary | ICD-10-CM

## 2022-01-24 DIAGNOSIS — I10 Essential (primary) hypertension: Secondary | ICD-10-CM

## 2022-01-24 MED ORDER — ATORVASTATIN CALCIUM 20 MG PO TABS: 20.0000 mg | ORAL_TABLET | Freq: Every day | ORAL | 3 refills | Status: DC

## 2022-01-24 MED ORDER — LISINOPRIL-HYDROCHLOROTHIAZIDE 10-12.5 MG PO TABS: 1.0000 | ORAL_TABLET | Freq: Every day | ORAL | 3 refills | Status: DC

## 2022-01-24 MED ORDER — METFORMIN HCL 1000 MG PO TABS: ORAL_TABLET | ORAL | 3 refills | Status: DC

## 2022-01-24 MED ORDER — GLIPIZIDE ER 10 MG PO TB24: 10.0000 mg | ORAL_TABLET | Freq: Every day | ORAL | 3 refills | Status: DC

## 2022-01-24 MED ORDER — MELOXICAM 15 MG PO TABS
ORAL_TABLET | ORAL | 3 refills | Status: DC
Start: 2022-01-24 — End: 2022-07-25

## 2022-01-24 MED ORDER — LEVOTHYROXINE SODIUM 75 MCG PO TABS: 75.0000 ug | ORAL_TABLET | Freq: Every day | ORAL | 3 refills | Status: DC

## 2022-01-24 MED ORDER — SITAGLIPTIN PHOSPHATE 100 MG PO TABS: 100.0000 mg | ORAL_TABLET | Freq: Every day | ORAL | 3 refills | Status: DC

## 2022-01-24 NOTE — Patient Instructions (Addendum)
Think about bike for exercise   Stop the B12 for now   Instead of meloxicam , you can try voltaren gel 1% topically in the future     Call [facility] to schedule your bone density test   Please call the location of your choice from the menu below to schedule your Mammogram and/or Bone Density appointment.    [facility]                      Phone:  [phone], [place] [address]                                                             Services: Traditional and 3D Mammogram, [facility]                 Phone: [phone] [address], [place] [address]    Service: Bone Density ONLY   *this site does NOT perform mammograms  [facility]                        Phone:  [phone] [address], [place] [address]                                            Services:  3D Mammogram and [place] at [facility]   Phone:  [phone]   [place], [place] [address]                                            Services: 3D Mammogram and [place]  [place] [facility])  Phone:  [phone]   [address]  [place], [place] [address]                                              Services:  3D Mammogram and Bone Density  

## 2022-01-24 NOTE — Assessment & Plan Note (Signed)
bp in fair control at this time  BP Readings from Last 1 Encounters:  01/24/22 118/66   No changes needed Most recent labs reviewed  Disc lifstyle change with low sodium diet and exercise  Plan to continue lisinopril hct 10-12.5 mg daily

## 2022-01-24 NOTE — Assessment & Plan Note (Signed)
Reviewed health habits including diet and exercise and skin cancer prevention Reviewed appropriate screening tests for age  Also reviewed health mt list, fam hx and immunization status , as well as social and family history   See HPI Labs reviewed  Colonoscopy and mammogram utd  Vaccines reviewed  oph exam utd 07/2021 dexa ordered, no falls or fractures, taking vit D No cognitive concerns Advance directive is up to date  No hearing screen, eye and vision care is utd PHQ 0 No help needed with ADLs, high function

## 2022-01-24 NOTE — Progress Notes (Signed)
Subjective:    Patient ID: Brooke Mitchell, female    DOB: 09/15/1950, 71 y.o.   MRN: [id number]  HPI Pt presents for amw and health mt visit   I have personally reviewed the Medicare Annual Wellness questionnaire and have noted 1. The patient's medical and social history 2. Their use of alcohol, tobacco or illicit drugs 3. Their current medications and supplements 4. The patient's functional ability including ADL's, fall risks, home safety risks and hearing or visual             impairment. 5. Diet and physical activities 6. Evidence for depression or mood disorders  The patients weight, height, BMI have been recorded in the chart and visual acuity is per eye clinic.  I have made referrals, counseling and provided education to the patient based review of the above and I have provided the pt with a written personalized care plan for preventive services. Reviewed and updated provider list, see scanned forms.  See scanned forms.  Routine anticipatory guidance given to patient.  See health maintenance. Colon cancer screening  colonoscopy 01/2015  Breast cancer screening  mammogram 08/2021  (had a re check that was normal)  Self breast exam: no lumps  Flu vaccine utd Eye exam-was November of 2022  Tetanus vaccine  utd Pneumovax utd Zoster vaccine utd Dexa  12/2016  normal BMD -wants to order it  Falls none Fractures   none  Supplements vit D 4000 iu daily  Exercise : walking   Knee problems limit her/no steps   Immunization History  Administered Date(s) Administered   Fluad Quad(high Dose 65+) 05/08/2019, 05/30/2020   H1N1 09/22/2008   Influenza Split 05/31/2012   Influenza Whole 06/05/2006, 06/29/2007, 07/09/2009, 08/04/2010   Influenza, High Dose Seasonal PF 06/10/2021   Influenza,inj,Quad PF,6+ Mos 06/19/2013, 06/25/2014, 06/16/2015, 06/14/2016, 06/14/2017, 05/25/2018   PFIZER(Purple Top)SARS-COV-2 Vaccination 09/25/2019, 10/16/2019, 06/17/2020   Pfizer Covid-19 Vaccine  Bivalent Booster 37yr & up 06/10/2021   Pneumococcal Conjugate-13 12/23/2016   Pneumococcal Polysaccharide-23 04/03/2009, 12/29/2017   Td 01/04/2004, 03/31/2010   Tdap 08/23/2016   Zoster Recombinat (Shingrix) 07/28/2017, 03/24/2018   Zoster, Live 04/29/2011    Bivalent 06/10/21  Advance directive: up to date   Cognitive function addressed- see scanned forms- and if abnormal then additional documentation follows.   Name recall drives her crazy- then it comes to her Not confused Not lost   [place]  Is social as well  In a retirement community  Works part time at [employer]  -likes it   [place]and [place]  Meds, vitals, and allergies reviewed.   ROS: See HPI.  Otherwise negative.    Weight : Wt Readings from Last 3 Encounters:  01/24/22 201 lb 8 oz (91.4 kg)  07/23/21 202 lb 2 oz (91.7 kg)  07/13/21 210 lb (95.3 kg)   34.86 kg/m  Feeling ok  Taking care of herself   A little wt loss  Working on it  Watching what she eats-less sweet/fat   Walking for exercise    Hearing/vision: Hearing Screening   '500Hz'$  '1000Hz'$  '2000Hz'$  '4000Hz'$   Right ear 40 40 40 40  Left ear 40 40 40 40  Vision Screening - Comments:: Nov 2022 with Brooke Mitchell  PHQ:    01/24/2022    2:22 PM 01/13/2021   10:49 AM 01/13/2020   10:33 AM 01/04/2019   10:48 AM 04/10/2018    9:07 AM  Depression screen PHQ 2/9  Decreased Interest 0 0 0 0 0  Down, Depressed, Hopeless 0 0 0 0 0  PHQ - 2 Score 0 0 0 0 0  Altered sleeping  0 0 0   Tired, decreased energy  0 0 0   Change in appetite  0 0 0   Feeling bad or failure about yourself   0 0 0   Trouble concentrating  0 0 0   Moving slowly or fidgety/restless  0 0 0   Suicidal thoughts  0 0 0   PHQ-9 Score  0 0 0   Difficult doing work/chores  Not difficult at all Not difficult at all Not difficult at all      ADLs:  no help needed    Functionality: excellent   Care team : Brooke Mitchell-[facility] - podiatry    HTN  bp is stable today  No cp or palpitations or headaches or edema  No side effects to medicines  BP Readings from Last 3 Encounters:  01/24/22 118/66  07/23/21 134/78  01/20/21 118/78     Lisinopril hct 10-12.5 mg daily     DM2 Lab Results  Component Value Date   HGBA1C 6.3 01/17/2022  Well controlled   Januvia 100 mg dialy  Glipizide xl 10 mg daily  Metformin 1000 mg bid   Eye exam: November   On ace and statin   Hyperlipidemia Lab Results  Component Value Date   CHOL 166 01/17/2022   CHOL 157 07/23/2021   CHOL 148 03/03/2021   Lab Results  Component Value Date   HDL 47.60 01/17/2022   HDL 43.70 07/23/2021   HDL 40.60 03/03/2021   Lab Results  Component Value Date   LDLCALC 69 03/03/2021   LDLCALC 94 01/13/2021   LDLCALC 90 07/16/2020   Lab Results  Component Value Date   TRIG 220.0 (H) 01/17/2022   TRIG 208.0 (H) 07/23/2021   TRIG 194.0 (H) 03/03/2021   Lab Results  Component Value Date   CHOLHDL 3 01/17/2022   CHOLHDL 4 07/23/2021   CHOLHDL 4 03/03/2021   Lab Results  Component Value Date   LDLDIRECT 99.0 01/17/2022   LDLDIRECT 92.0 07/23/2021   LDLDIRECT 85.0 05/22/2018   Atorvastatin 20 mg daily Watching diet   Hypothyroidism  Pt has no clinical changes No change in energy level/ hair or skin/ edema and no tremor Lab Results  Component Value Date   TSH 1.71 01/17/2022    Levothyroxine 75 mcg daily      Lab Results  Component Value Date   WBC 7.3 01/17/2022   HGB 12.9 01/17/2022   HCT 38.1 01/17/2022   MCV 89.9 01/17/2022   PLT 246.0 01/17/2022    Lab Results  Component Value Date   VITAMINB12 >1504 (H) 01/17/2022   Patient Active Problem List   Diagnosis Date Noted   Medicare annual wellness visit, subsequent 01/24/2022   COVID-19 07/13/2021   Cold hands and feet 10/08/2020   History of melanoma in situ 01/07/2018   Welcome to Medicare preventive visit 12/23/2016   Screening mammogram, encounter for  12/23/2016   Estrogen deficiency 12/23/2016   Neck pain 08/30/2016   Foot pain, left 12/23/2015   Urge incontinence 12/23/2015   Anemia, iron deficiency 02/19/2015   Vitamin B 12 deficiency 12/05/2014   Fatigue 11/26/2014   Grief reaction 01/22/2014   Colon cancer screening 10/24/2012   Class 1 obesity with serious comorbidity and body mass index (BMI) of 34.0 to 34.9 in adult  10/19/2011   Routine general medical examination at a health care facility 04/07/2011   UTI'S, RECURRENT 02/03/2010   Hypothyroidism 11/24/2006   Diabetes type 2, controlled ([place]) 11/24/2006   Hyperlipidemia associated with type 2 diabetes mellitus ([place]) 11/24/2006   RESTLESS LEG SYNDROME 11/24/2006   Essential hypertension 11/24/2006   PREMATURE VENTRICULAR CONTRACTIONS 11/24/2006   HEMORRHOIDS 11/24/2006   ALLERGIC RHINITIS 11/24/2006   GERD 11/24/2006   HIATAL HERNIA 11/24/2006   OVERACTIVE BLADDER 11/24/2006   DISC DISEASE, CERVICAL 11/24/2006   Sleep apnea 11/24/2006   Past Medical History:  Diagnosis Date   Allergic rhinitis    Allergy    Anxiety    Arthritis    osteoarthritis   Asthma    As a child    Depression    Diabetes mellitus    Type II (2/06 elevated microalbumin)   Frequent UTI    with coital prohylaxis    GERD (gastroesophageal reflux disease)    Hyperlipidemia    Hypertension    Hypothyroidism    IDA (iron deficiency anemia)    Obesity    OSA (obstructive sleep apnea)    CPAP   Sleep apnea    wears c-pap   Past Surgical History:  Procedure Laterality Date   BELPHAROPTOSIS REPAIR     COLONOSCOPY     DILATION AND CURETTAGE OF UTERUS     ENDOMETRIAL BIOPSY     FOOT SURGERY     UPPER GASTROINTESTINAL ENDOSCOPY     Social History   Tobacco Use   Smoking status: Never   Smokeless tobacco: Never  Vaping Use   Vaping Use: Never used  Substance Use Topics   Alcohol use: Yes    Alcohol/week: 0.0 standard drinks    Comment: occasional   Drug use: No   Family  History  Problem Relation Age of Onset   Diabetes Mother    Coronary artery disease Mother    Hypothyroidism Mother    Irritable bowel syndrome Mother    Alzheimer's disease Father    Nephrolithiasis Father    Hypothyroidism Brother    Breast cancer Paternal Grandmother 49   Colon cancer Neg Hx    Esophageal cancer Neg Hx    Rectal cancer Neg Hx    Stomach cancer Neg Hx    No Known Allergies Current Outpatient Medications on File Prior to Visit  Medication Sig Dispense Refill   acetaminophen (TYLENOL) 325 MG tablet Take 650 mg by mouth as needed for pain.     albuterol (PROAIR HFA) 108 (90 Base) MCG/ACT inhaler INHALE TWO PUFFS BY MOUTH UP TO EVERY 4 HOURS AS NEEDED FOR WHEEZING 8.5 g 5   Ascorbic Acid (VITAMIN C) 100 MG tablet Take 100 mg by mouth daily.     calcium carbonate (OS-CAL) 600 MG TABS tablet Take 600 mg by mouth 2 (two) times daily with a meal.     Cholecalciferol 4000 UNITS CAPS Take 4,000 Units by mouth daily. Pt takes 2 tablets of 2,000 units per day = 4,000 units a day     Cyanocobalamin (VITAMIN B-12 PO) Take 6 mcg by mouth daily.     fluticasone-salmeterol (ADVAIR) 100-50 MCG/ACT AEPB Inhale 1 puff into the lungs 2 (two) times daily. 1 each 3   glucose blood (ACCU-CHEK GUIDE) test strip USE TO CHECK BLOOD SUGAR TWO TIMES A DAY AS NEEDED 100 strip 1   hyoscyamine (LEVSIN SL) 0.125 MG SL tablet Place one tablet under tongue once a day as needed. [place]  tablet 1   IRON, FERROUS GLUCONATE, PO Take 1 tablet by mouth 2 (two) times daily.      loratadine (CLARITIN) 10 MG tablet Take 10 mg by mouth daily.     multivitamin (THERAGRAN) per tablet Take 1 tablet by mouth daily.     No current facility-administered medications on file prior to visit.    Review of Systems  Constitutional:  Negative for activity change, appetite change, fatigue, fever and unexpected weight change.  HENT:  Negative for congestion, ear pain, rhinorrhea, sinus pressure and sore throat.   Eyes:   Negative for pain, redness and visual disturbance.  Respiratory:  Negative for cough, shortness of breath and wheezing.   Cardiovascular:  Negative for chest pain and palpitations.  Gastrointestinal:  Negative for abdominal pain, blood in stool, constipation and diarrhea.  Endocrine: Negative for polydipsia and polyuria.  Genitourinary:  Negative for dysuria, frequency and urgency.  Musculoskeletal:  Negative for arthralgias, back pain and myalgias.  Skin:  Negative for pallor and rash.  Allergic/Immunologic: Negative for environmental allergies.  Neurological:  Negative for dizziness, syncope and headaches.  Hematological:  Negative for adenopathy. Does not bruise/bleed easily.  Psychiatric/Behavioral:  Negative for decreased concentration and dysphoric mood. The patient is not nervous/anxious.       Objective:   Physical Exam Constitutional:      General: She is not in acute distress.    Appearance: Normal appearance. She is well-developed. She is obese. She is not ill-appearing or diaphoretic.  HENT:     Head: Normocephalic and atraumatic.     Right Ear: Tympanic membrane, ear canal and external ear normal.     Left Ear: Tympanic membrane, ear canal and external ear normal.     Nose: Nose normal. No congestion.     Mouth/Throat:     Mouth: Mucous membranes are moist.     Pharynx: Oropharynx is clear. No posterior oropharyngeal erythema.  Eyes:     General: No scleral icterus.    Extraocular Movements: Extraocular movements intact.     Conjunctiva/sclera: Conjunctivae normal.     Pupils: Pupils are equal, round, and reactive to light.  Neck:     Thyroid: No thyromegaly.     Vascular: No carotid bruit or JVD.  Cardiovascular:     Rate and Rhythm: Normal rate and regular rhythm.     Pulses: Normal pulses.     Heart sounds: Normal heart sounds.    No gallop.  Pulmonary:     Effort: Pulmonary effort is normal. No respiratory distress.     Breath sounds: Normal breath sounds.  No wheezing.     Comments: Good air exch Chest:     Chest wall: No tenderness.  Abdominal:     General: Bowel sounds are normal. There is no distension or abdominal bruit.     Palpations: Abdomen is soft. There is no mass.     Tenderness: There is no abdominal tenderness.     Hernia: No hernia is present.  Genitourinary:    Comments: Breast exam: No mass, nodules, thickening, tenderness, bulging, retraction, inflamation, nipple discharge or skin changes noted.  No axillary or clavicular LA.     Musculoskeletal:        General: No tenderness. Normal range of motion.     Cervical back: Normal range of motion and neck supple. No rigidity. No muscular tenderness.     Right lower leg: No edema.     Left lower leg: No edema.  Comments: No kyphosis   Lymphadenopathy:     Cervical: No cervical adenopathy.  Skin:    General: Skin is warm and dry.     Coloration: Skin is not pale.     Findings: No erythema or rash.     Comments: Solar lentigines diffusely Scattered SKs  Neurological:     Mental Status: She is alert. Mental status is at baseline.     Cranial Nerves: No cranial nerve deficit.     Motor: No abnormal muscle tone.     Coordination: Coordination normal.     Gait: Gait normal.     Deep Tendon Reflexes: Reflexes are normal and symmetric. Reflexes normal.  Psychiatric:        Mood and Affect: Mood normal.        Cognition and Memory: Cognition and memory normal.          Assessment & Plan:   Problem List Items Addressed This Visit       Cardiovascular and Mediastinum   Essential hypertension    bp in fair control at this time  BP Readings from Last 1 Encounters:  01/24/22 118/66  No changes needed Most recent labs reviewed  Disc lifstyle change with low sodium diet and exercise  Plan to continue lisinopril hct 10-12.5 mg daily      Relevant Medications   lisinopril-hydrochlorothiazide (ZESTORETIC) 10-12.5 MG tablet   atorvastatin (LIPITOR) 20 MG tablet      Endocrine   Diabetes type 2, controlled ([place])    Lab Results  Component Value Date   HGBA1C 6.3 01/17/2022  Plan to continue  Januvia 100 mg dialy  Glipizide xl 10 mg daily  Metformin 1000 mg bid       Relevant Medications   lisinopril-hydrochlorothiazide (ZESTORETIC) 10-12.5 MG tablet   metFORMIN (GLUCOPHAGE) 1000 MG tablet   sitaGLIPtin (JANUVIA) 100 MG tablet   atorvastatin (LIPITOR) 20 MG tablet   glipiZIDE (GLUCOTROL XL) 10 MG 24 hr tablet   Hyperlipidemia associated with type 2 diabetes mellitus ([facility])    Disc goals for lipids and reasons to control them Rev last labs with pt Rev low sat fat diet in detail LDL is down to 69   Plan to continue atorvastatin 20 mg daily         Relevant Medications   lisinopril-hydrochlorothiazide (ZESTORETIC) 10-12.5 MG tablet   metFORMIN (GLUCOPHAGE) 1000 MG tablet   sitaGLIPtin (JANUVIA) 100 MG tablet   atorvastatin (LIPITOR) 20 MG tablet   glipiZIDE (GLUCOTROL XL) 10 MG 24 hr tablet   Hypothyroidism    Hypothyroidism  Pt has no clinical changes No change in energy level/ hair or skin/ edema and no tremor Lab Results  Component Value Date   TSH 1.71 01/17/2022     Plan to continue levothyroxine 75 mcg daily      Relevant Medications   levothyroxine (SYNTHROID) 75 MCG tablet     Other   Estrogen deficiency    5 year dexa ordered  No falls or fx Taking vit D       Relevant Orders   DG Bone Density   Medicare annual wellness visit, subsequent - Primary    Reviewed health habits including diet and exercise and skin cancer prevention Reviewed appropriate screening tests for age  Also reviewed health mt list, fam hx and immunization status , as well as social and family history   See HPI Labs reviewed  Colonoscopy and mammogram utd  Vaccines reviewed  oph exam utd 07/2021 dexa  ordered, no falls or fractures, taking vit D No cognitive concerns Advance directive is up to date  No hearing screen, eye and vision  care is utd PHQ 0 No help needed with ADLs, high function       Routine general medical examination at a health care facility    Reviewed health habits including diet and exercise and skin cancer prevention Reviewed appropriate screening tests for age  Also reviewed health mt list, fam hx and immunization status , as well as social and family history   See HPI Labs reviewed  Colonoscopy and mammogram utd  Vaccines reviewed  oph exam utd 07/2021 dexa ordered, no falls or fractures, taking vit D No cognitive concerns Advance directive is up to date  No hearing screen, eye and vision care is utd PHQ 0 No help needed with ADLs, high function       Vitamin B 12 deficiency    Lab Results  Component Value Date   VITAMINB12 >1504 (H) 01/17/2022  inst ho hold her B12 Will continue to monitor

## 2022-01-24 NOTE — Assessment & Plan Note (Addendum)
Reviewed health habits including diet and exercise and skin cancer prevention Reviewed appropriate screening tests for age  Also reviewed health mt list, fam hx and immunization status , as well as social and family history   See HPI Labs reviewed  Colonoscopy and mammogram utd  Vaccines reviewed  oph exam utd 07/2021 dexa ordered, no falls or fractures, taking vit D No cognitive concerns Advance directive is up to date  No hearing screen, eye and vision care is utd PHQ 0 No help needed with ADLs, high function

## 2022-01-24 NOTE — Assessment & Plan Note (Signed)
Lab Results  Component Value Date   VITAMINB12 >1504 (H) 01/17/2022   inst ho hold her B12 Will continue to monitor

## 2022-01-24 NOTE — Assessment & Plan Note (Addendum)
Disc goals for lipids and reasons to control them Rev last labs with pt Rev low sat fat diet in detail LDL is down to 69   Plan to continue atorvastatin 20 mg daily

## 2022-01-24 NOTE — Assessment & Plan Note (Signed)
Hypothyroidism  Pt has no clinical changes No change in energy level/ hair or skin/ edema and no tremor Lab Results  Component Value Date   TSH 1.71 01/17/2022     Plan to continue levothyroxine 75 mcg daily

## 2022-01-24 NOTE — Assessment & Plan Note (Signed)
5 year dexa ordered  No falls or fx Taking vit D

## 2022-01-24 NOTE — Assessment & Plan Note (Signed)
Lab Results  Component Value Date   HGBA1C 6.3 01/17/2022   Plan to continue  Januvia 100 mg dialy  Glipizide xl 10 mg daily  Metformin 1000 mg bid

## 2022-03-05 ENCOUNTER — Other Ambulatory Visit: Payer: Self-pay | Admitting: Family Medicine

## 2022-03-18 ENCOUNTER — Ambulatory Visit
Admission: EM | Admit: 2022-03-18 | Discharge: 2022-03-18 | Disposition: A | Discharge status: Home / Self Care | Payer: Medicare PPO

## 2022-03-18 DIAGNOSIS — S4991XA Unspecified injury of right shoulder and upper arm, initial encounter: Secondary | ICD-10-CM | POA: Diagnosis not present

## 2022-03-18 DIAGNOSIS — M25511 Pain in right shoulder: Secondary | ICD-10-CM | POA: Diagnosis not present

## 2022-03-18 DIAGNOSIS — M75100 Unspecified rotator cuff tear or rupture of unspecified shoulder, not specified as traumatic: Secondary | ICD-10-CM | POA: Insufficient documentation

## 2022-03-22 ENCOUNTER — Telehealth: Payer: Self-pay | Admitting: Family Medicine

## 2022-03-22 DIAGNOSIS — S4991XA Unspecified injury of right shoulder and upper arm, initial encounter: Secondary | ICD-10-CM | POA: Diagnosis not present

## 2022-03-22 NOTE — Telephone Encounter (Signed)
Left message for patient to call back and schedule Medicare Annual Wellness Visit (AWV).   Please offer to do virtually or by telephone.   Last AWV:01/13/2021  Please schedule at anytime with [facility] schedule 2  45 minute appointent  If any questions, please contact me at [phone]

## 2022-03-25 DIAGNOSIS — S46011A Strain of muscle(s) and tendon(s) of the rotator cuff of right shoulder, initial encounter: Secondary | ICD-10-CM | POA: Diagnosis not present

## 2022-04-11 DIAGNOSIS — M25811 Other specified joint disorders, right shoulder: Secondary | ICD-10-CM | POA: Diagnosis not present

## 2022-04-14 DIAGNOSIS — M25811 Other specified joint disorders, right shoulder: Secondary | ICD-10-CM | POA: Diagnosis not present

## 2022-04-19 DIAGNOSIS — M25811 Other specified joint disorders, right shoulder: Secondary | ICD-10-CM | POA: Diagnosis not present

## 2022-04-26 DIAGNOSIS — M25811 Other specified joint disorders, right shoulder: Secondary | ICD-10-CM | POA: Diagnosis not present

## 2022-05-04 DIAGNOSIS — M25811 Other specified joint disorders, right shoulder: Secondary | ICD-10-CM | POA: Diagnosis not present

## 2022-05-05 DIAGNOSIS — M25811 Other specified joint disorders, right shoulder: Secondary | ICD-10-CM | POA: Diagnosis not present

## 2022-05-16 DIAGNOSIS — M25811 Other specified joint disorders, right shoulder: Secondary | ICD-10-CM | POA: Diagnosis not present

## 2022-05-19 DIAGNOSIS — M25811 Other specified joint disorders, right shoulder: Secondary | ICD-10-CM | POA: Diagnosis not present

## 2022-05-23 DIAGNOSIS — M25811 Other specified joint disorders, right shoulder: Secondary | ICD-10-CM | POA: Diagnosis not present

## 2022-05-27 DIAGNOSIS — M25811 Other specified joint disorders, right shoulder: Secondary | ICD-10-CM | POA: Diagnosis not present

## 2022-05-30 DIAGNOSIS — M25811 Other specified joint disorders, right shoulder: Secondary | ICD-10-CM | POA: Diagnosis not present

## 2022-06-03 DIAGNOSIS — M25811 Other specified joint disorders, right shoulder: Secondary | ICD-10-CM | POA: Diagnosis not present

## 2022-06-06 DIAGNOSIS — M25811 Other specified joint disorders, right shoulder: Secondary | ICD-10-CM | POA: Diagnosis not present

## 2022-06-13 DIAGNOSIS — M25811 Other specified joint disorders, right shoulder: Secondary | ICD-10-CM | POA: Diagnosis not present

## 2022-06-17 DIAGNOSIS — M25811 Other specified joint disorders, right shoulder: Secondary | ICD-10-CM | POA: Diagnosis not present

## 2022-06-27 DIAGNOSIS — E119 Type 2 diabetes mellitus without complications: Secondary | ICD-10-CM | POA: Diagnosis not present

## 2022-06-27 DIAGNOSIS — H40013 Open angle with borderline findings, low risk, bilateral: Secondary | ICD-10-CM | POA: Diagnosis not present

## 2022-06-27 DIAGNOSIS — H2513 Age-related nuclear cataract, bilateral: Secondary | ICD-10-CM | POA: Diagnosis not present

## 2022-07-25 ENCOUNTER — Encounter: Payer: Self-pay | Admitting: Family Medicine

## 2022-07-25 ENCOUNTER — Ambulatory Visit: Payer: Medicare PPO | Admitting: Family Medicine

## 2022-07-25 VITALS — BP 124/80 | HR 83 | Temp 97.3°F | Ht 63.75 in | Wt 199.1 lb

## 2022-07-25 DIAGNOSIS — I10 Essential (primary) hypertension: Secondary | ICD-10-CM | POA: Diagnosis not present

## 2022-07-25 DIAGNOSIS — Z1231 Encounter for screening mammogram for malignant neoplasm of breast: Secondary | ICD-10-CM | POA: Diagnosis not present

## 2022-07-25 DIAGNOSIS — E785 Hyperlipidemia, unspecified: Secondary | ICD-10-CM

## 2022-07-25 DIAGNOSIS — E1169 Type 2 diabetes mellitus with other specified complication: Secondary | ICD-10-CM

## 2022-07-25 DIAGNOSIS — E119 Type 2 diabetes mellitus without complications: Secondary | ICD-10-CM | POA: Diagnosis not present

## 2022-07-25 NOTE — Assessment & Plan Note (Signed)
Mammogram due in December Ordered -pt will call to schedule

## 2022-07-25 NOTE — Assessment & Plan Note (Signed)
Disc goals for lipids and reasons to control them Rev last labs with pt Rev low sat fat diet in detail  Lab ordered  Atorvastatin 20 mg daily is tolerated well

## 2022-07-25 NOTE — Progress Notes (Signed)
Subjective:    Patient ID: Brooke Mitchell, female    DOB: 05/30/1951, 71 y.o.   MRN: [id number]  HPI Pt presents for f/u of DM2 and HTN  Wt Readings from Last 3 Encounters:  07/25/22 199 lb 2 oz (90.3 kg)  01/24/22 201 lb 8 oz (91.4 kg)  07/23/21 202 lb 2 oz (91.7 kg)   34.45 kg/m  Doing ok overall   Fell and tore rotator cuff this summer  Did not do surgery- had PT   Go[facility] to [facility] Pain is tolerable  Thinks she can stop the meloxicam   Mammogram due next month  Will schedule as long as order is in    HTN bp is stable today  No cp or palpitations or headaches or edema  No side effects to medicines  BP Readings from Last 3 Encounters:  07/25/22 124/80  01/24/22 118/66  07/23/21 134/78    Lisinopril 10-12.5 mg daily  No problems   Lab Results  Component Value Date   CREATININE 0.95 01/17/2022   BUN 18 01/17/2022   NA 142 01/17/2022   K 3.8 01/17/2022   CL 105 01/17/2022   CO2 27 01/17/2022     DM2 Lab Results  Component Value Date   HGBA1C 6.3 01/17/2022   Januvia 100 mg daily  Glipizide xl 10 mg daily  Metformin 1000 mg bid   Blood sugar is doing well  Usually 120s in am (or below)   Eye exam : is up to date / about a month ago   Diet has been stable  Every once in a while has sweets /not often   Lost wt to 193 at home this summer / gained it bad  Struggles with carbs   Exercise -needs to do more  Needs to plan it  Likes to walk or use recumbent bike    Hyperlipidemia Lab Results  Component Value Date   CHOL 166 01/17/2022   HDL 47.60 01/17/2022   LDLCALC 69 03/03/2021   LDLDIRECT 99.0 01/17/2022   TRIG 220.0 (H) 01/17/2022   CHOLHDL 3 01/17/2022    Atorvastatin 20 mg daily   Patient Active Problem List   Diagnosis Date Noted   Encounter for screening mammogram for breast cancer 07/25/2022   Medicare annual wellness visit, subsequent 01/24/2022   COVID-19 07/13/2021   Cold hands and feet 10/08/2020   History of  melanoma in situ 01/07/2018   Welcome to Medicare preventive visit 12/23/2016   Screening mammogram, encounter for 12/23/2016   Estrogen deficiency 12/23/2016   Neck pain 08/30/2016   Foot pain, left 12/23/2015   Urge incontinence 12/23/2015   Anemia, iron deficiency 02/19/2015   Vitamin B 12 deficiency 12/05/2014   Fatigue 11/26/2014   Grief reaction 01/22/2014   Colon cancer screening 10/24/2012   Class 1 obesity with serious comorbidity and body mass index (BMI) of 34.0 to 34.9 in adult 10/19/2011   Routine general medical examination at a health care facility 04/07/2011   UTI'S, RECURRENT 02/03/2010   Hypothyroidism 11/24/2006   Diabetes type 2, controlled ([place]) 11/24/2006   Hyperlipidemia associated with type 2 diabetes mellitus ([place]) 11/24/2006   RESTLESS LEG SYNDROME 11/24/2006   Essential hypertension 11/24/2006   PREMATURE VENTRICULAR CONTRACTIONS 11/24/2006   HEMORRHOIDS 11/24/2006   ALLERGIC RHINITIS 11/24/2006   GERD 11/24/2006   HIATAL HERNIA 11/24/2006   OVERACTIVE BLADDER 11/24/2006   [place] DISEASE, CERVICAL 11/24/2006   Sleep apnea 11/24/2006   Past Medical History:  Diagnosis Date  Allergic rhinitis    Allergy    Anxiety    Arthritis    osteoarthritis   Asthma    As a child    Depression    Diabetes mellitus    Type II (2/06 elevated microalbumin)   Frequent UTI    with coital prohylaxis    GERD (gastroesophageal reflux disease)    Hyperlipidemia    Hypertension    Hypothyroidism    IDA (iron deficiency anemia)    Obesity    OSA (obstructive sleep apnea)    CPAP   Sleep apnea    wears c-pap   Past Surgical History:  Procedure Laterality Date   BELPHAROPTOSIS REPAIR     COLONOSCOPY     DILATION AND CURETTAGE OF UTERUS     ENDOMETRIAL BIOPSY     FOOT SURGERY     UPPER GASTROINTESTINAL ENDOSCOPY     Social History   Tobacco Use   Smoking status: Never   Smokeless tobacco: Never  Vaping Use   Vaping Use: Never used  Substance Use  Topics   Alcohol use: Yes    Alcohol/week: 0.0 standard drinks of alcohol    Comment: occasional   Drug use: No   Family History  Problem Relation Age of Onset   Diabetes Mother    Coronary artery disease Mother    Hypothyroidism Mother    Irritable bowel syndrome Mother    Alzheimer's disease Father    Nephrolithiasis Father    Hypothyroidism Brother    Breast cancer Paternal Grandmother 27   Colon cancer Neg Hx    Esophageal cancer Neg Hx    Rectal cancer Neg Hx    Stomach cancer Neg Hx    No Known Allergies Current Outpatient Medications on File Prior to Visit  Medication Sig Dispense Refill   acetaminophen (TYLENOL) 325 MG tablet Take 650 mg by mouth as needed for pain.     albuterol (PROAIR HFA) 108 (90 Base) MCG/ACT inhaler INHALE TWO PUFFS BY MOUTH UP TO EVERY 4 HOURS AS NEEDED FOR WHEEZING 8.5 g 5   Ascorbic Acid (VITAMIN C) 100 MG tablet Take 100 mg by mouth daily.     atorvastatin (LIPITOR) 20 MG tablet Take 1 tablet (20 mg total) by mouth daily. 90 tablet 3   calcium carbonate (OS-CAL) 600 MG TABS tablet Take 600 mg by mouth 2 (two) times daily with a meal.     Cholecalciferol 4000 UNITS CAPS Take 4,000 Units by mouth daily. Pt takes 2 tablets of 2,000 units per day = 4,000 units a day     Cyanocobalamin (VITAMIN B-12 PO) Take 6 mcg by mouth daily.     fluticasone-salmeterol (ADVAIR) 100-50 MCG/ACT AEPB Inhale 1 puff into the lungs 2 (two) times daily. 1 each 3   glipiZIDE (GLUCOTROL XL) 10 MG 24 hr tablet Take 1 tablet (10 mg total) by mouth daily. 90 tablet 3   glucose blood (ACCU-CHEK GUIDE) test strip USE TO CHECK BLOOD SUGAR ONCE DAILY. Dx E11.9 100 strip 3   hyoscyamine (LEVSIN SL) 0.125 MG SL tablet Place one tablet under tongue once a day as needed. 30 tablet 1   IRON, FERROUS GLUCONATE, PO Take 1 tablet by mouth 2 (two) times daily.      levothyroxine (SYNTHROID) 75 MCG tablet Take 1 tablet (75 mcg total) by mouth daily before breakfast. 90 tablet 3    lisinopril-hydrochlorothiazide (ZESTORETIC) 10-12.5 MG tablet Take 1 tablet by mouth daily. 90 tablet 3   loratadine (  CLARITIN) 10 MG tablet Take 10 mg by mouth daily.     metFORMIN (GLUCOPHAGE) 1000 MG tablet TAKE ONE TABLET BY MOUTH TWICE A DAY WITH A MEAL 180 tablet 3   multivitamin (THERAGRAN) per tablet Take 1 tablet by mouth daily.     sitaGLIPtin (JANUVIA) 100 MG tablet Take 1 tablet (100 mg total) by mouth daily. 90 tablet 3   No current facility-administered medications on file prior to visit.     Review of Systems  Constitutional:  Positive for fatigue. Negative for activity change, appetite change, fever and unexpected weight change.  HENT:  Negative for congestion, ear pain, rhinorrhea, sinus pressure and sore throat.   Eyes:  Negative for pain, redness and visual disturbance.  Respiratory:  Negative for cough, shortness of breath and wheezing.   Cardiovascular:  Negative for chest pain and palpitations.  Gastrointestinal:  Negative for abdominal pain, blood in stool, constipation and diarrhea.  Endocrine: Negative for polydipsia and polyuria.  Genitourinary:  Negative for dysuria, frequency and urgency.  Musculoskeletal:  Positive for arthralgias. Negative for back pain and myalgias.  Skin:  Negative for pallor and rash.  Allergic/Immunologic: Negative for environmental allergies.  Neurological:  Negative for dizziness, syncope and headaches.  Hematological:  Negative for adenopathy. Does not bruise/bleed easily.  Psychiatric/Behavioral:  Negative for decreased concentration and dysphoric mood. The patient is not nervous/anxious.        Objective:   Physical Exam Constitutional:      General: She is not in acute distress.    Appearance: Normal appearance. She is well-developed. She is obese. She is not ill-appearing or diaphoretic.  HENT:     Head: Normocephalic and atraumatic.  Eyes:     General: No scleral icterus.    Conjunctiva/sclera: Conjunctivae normal.      Pupils: Pupils are equal, round, and reactive to light.  Neck:     Thyroid: No thyromegaly.     Vascular: No carotid bruit or JVD.  Cardiovascular:     Rate and Rhythm: Normal rate and regular rhythm.     Heart sounds: Normal heart sounds.     No gallop.  Pulmonary:     Effort: Pulmonary effort is normal. No respiratory distress.     Breath sounds: Normal breath sounds. No stridor. No wheezing or rales.  Abdominal:     General: There is no distension or abdominal bruit.     Palpations: Abdomen is soft.  Musculoskeletal:     Cervical back: Normal range of motion and neck supple.     Right lower leg: No edema.     Left lower leg: No edema.  Lymphadenopathy:     Cervical: No cervical adenopathy.  Skin:    General: Skin is warm and dry.     Coloration: Skin is not pale.     Findings: No rash.  Neurological:     Mental Status: She is alert.     Coordination: Coordination normal.     Deep Tendon Reflexes: Reflexes are normal and symmetric. Reflexes normal.  Psychiatric:        Mood and Affect: Mood normal.           Assessment & Plan:   Problem List Items Addressed This Visit       Cardiovascular and Mediastinum   Essential hypertension    bp in fair control at this time  BP Readings from Last 1 Encounters:  07/25/22 124/80  No changes needed Most recent labs reviewed  Disc lifstyle change  with low sodium diet and exercise  Plan to continue lisinopril hct 10-12.5 mg daily  Lab ordered       Relevant Orders   Comprehensive metabolic panel     Endocrine   Diabetes type 2, controlled ([place]) - Primary    Pending A1c today Plan to continue Januvia 100 mg daily  Glipizide xl 10 mg daily  Metformin 1000 mg bid       Relevant Orders   Hemoglobin A1c   Hyperlipidemia associated with type 2 diabetes mellitus ([place])    Disc goals for lipids and reasons to control them Rev last labs with pt Rev low sat fat diet in detail  Lab ordered  Atorvastatin 20 mg daily is  tolerated well      Relevant Orders   Lipid panel     Other   Encounter for screening mammogram for breast cancer    Mammogram due in December Ordered -pt will call to schedule      Relevant Orders   MM 3D SCREEN BREAST BILATERAL

## 2022-07-25 NOTE — Patient Instructions (Addendum)
Call and schedule your mammogram at [facility]    Make sure you check glucose later in the day (maybe every other day)  Am- every other  Pm (2 hours after a meal) every other   Stop up front on your way out   Gets started with regular exercise  Walk/bike   add strength training (light weight/ exercise bands, gym- machines) when you can   Labs today for diabetes and cholesterol   Keep working on weight loss  Try to get most of your carbohydrates from produce (with the exception of white potatoes)  Eat less bread/pasta/rice/snack foods/cereals/sweets and other items from the middle of the grocery store (processed carbs)

## 2022-07-25 NOTE — Assessment & Plan Note (Signed)
bp in fair control at this time  BP Readings from Last 1 Encounters:  07/25/22 124/80   No changes needed Most recent labs reviewed  Disc lifstyle change with low sodium diet and exercise  Plan to continue lisinopril hct 10-12.5 mg daily  Lab ordered

## 2022-07-25 NOTE — Assessment & Plan Note (Signed)
Pending A1c today Plan to continue Januvia 100 mg daily  Glipizide xl 10 mg daily  Metformin 1000 mg bid

## 2022-07-26 LAB — COMPREHENSIVE METABOLIC PANEL
ALT: 13 U/L (ref 0–35)
AST: 16 U/L (ref 0–37)
Albumin: 4.3 g/dL (ref 3.5–5.2)
Alkaline Phosphatase: 47 U/L (ref 39–117)
BUN: 22 mg/dL (ref 6–23)
CO2: 28 mEq/L (ref 19–32)
Calcium: 9.5 mg/dL (ref 8.4–10.5)
Chloride: 101 mEq/L (ref 96–112)
Creatinine, Ser: 1.03 mg/dL (ref 0.40–1.20)
GFR: 54.72 mL/min — ABNORMAL LOW (ref >60.00)
Glucose, Bld: 121 mg/dL — ABNORMAL HIGH (ref 70–99)
Potassium: 4.3 mEq/L (ref 3.5–5.1)
Sodium: 139 mEq/L (ref 135–145)
Total Bilirubin: 0.4 mg/dL (ref 0.2–1.2)
Total Protein: 6.6 g/dL (ref 6.0–8.3)

## 2022-07-26 LAB — LIPID PANEL
Cholesterol: 176 mg/dL (ref 0–200)
HDL: 46.1 mg/dL (ref >39.00)
NonHDL: 129.74
Total CHOL/HDL Ratio: 4
Triglycerides: 282 mg/dL — ABNORMAL HIGH (ref 0.0–149.0)
VLDL: 56.4 mg/dL — ABNORMAL HIGH (ref 0.0–40.0)

## 2022-07-26 LAB — HEMOGLOBIN A1C: Hgb A1c MFr Bld: 6.3 % (ref 4.6–6.5)

## 2022-07-26 LAB — LDL CHOLESTEROL, DIRECT: Direct LDL: 104 mg/dL

## 2022-07-27 ENCOUNTER — Ambulatory Visit: Payer: Medicare PPO | Admitting: Family Medicine

## 2022-08-19 ENCOUNTER — Ambulatory Visit
Admission: RE | Admit: 2022-08-19 | Discharge: 2022-08-19 | Disposition: A | Discharge status: Home / Self Care | Payer: Medicare PPO | Source: Ambulatory Visit | Attending: Family Medicine | Admitting: Family Medicine

## 2022-08-19 DIAGNOSIS — Z1231 Encounter for screening mammogram for malignant neoplasm of breast: Secondary | ICD-10-CM | POA: Diagnosis not present

## 2022-09-09 IMAGING — MG DIGITAL SCREENING BILAT W/ TOMO W/ CAD
8 series · 8 of 24 positions shown · non-contrast
Comparison: Previous exam(s).

CLINICAL DATA: Screening.

EXAM:
DIGITAL SCREENING BILATERAL MAMMOGRAM WITH TOMO AND CAD

[L CC synth-2D]
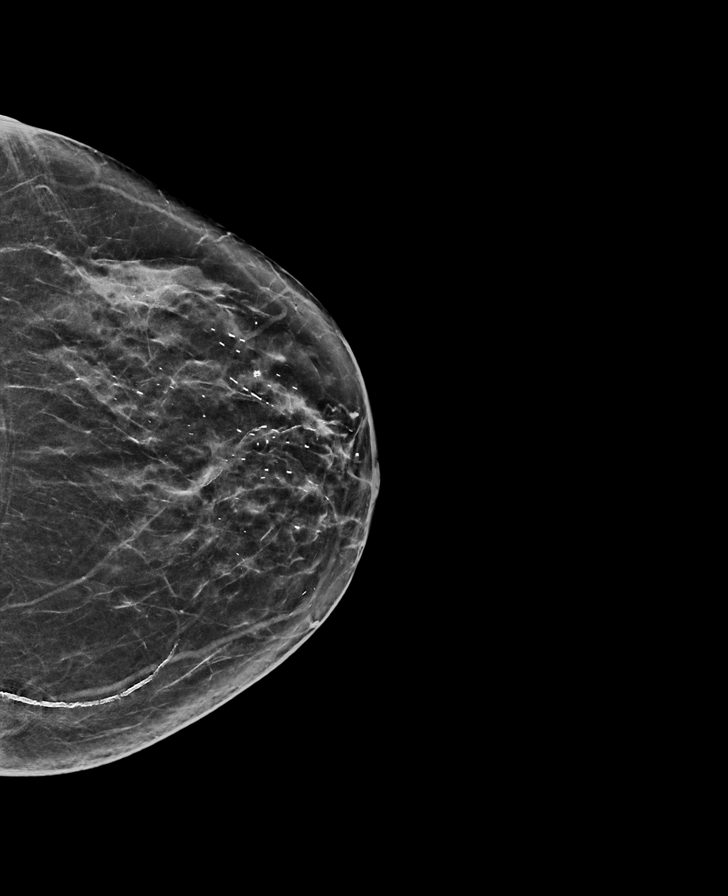

[R MLO synth-2D]
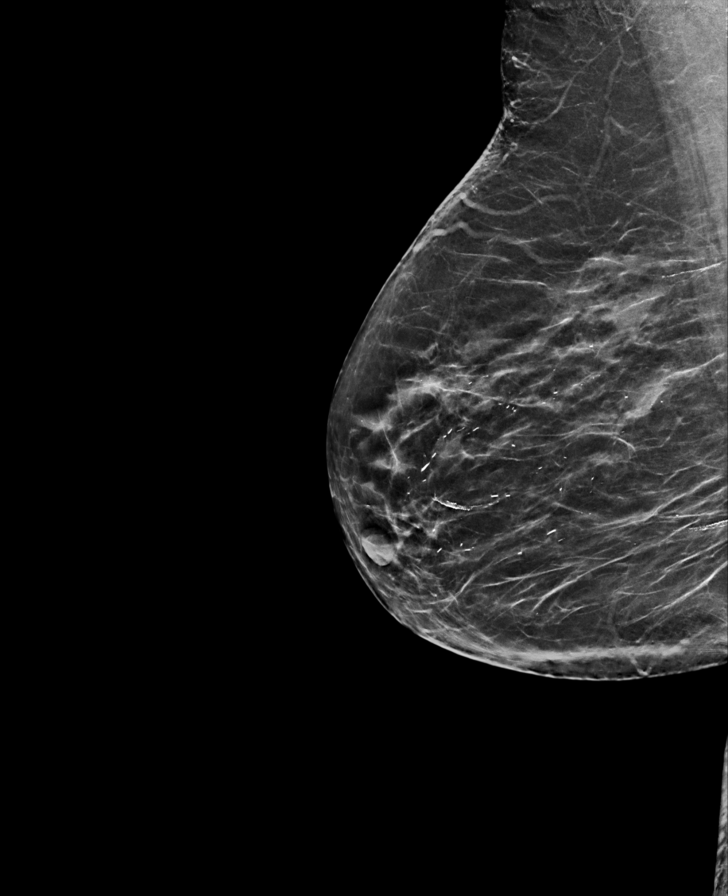

[R CC synth-2D]
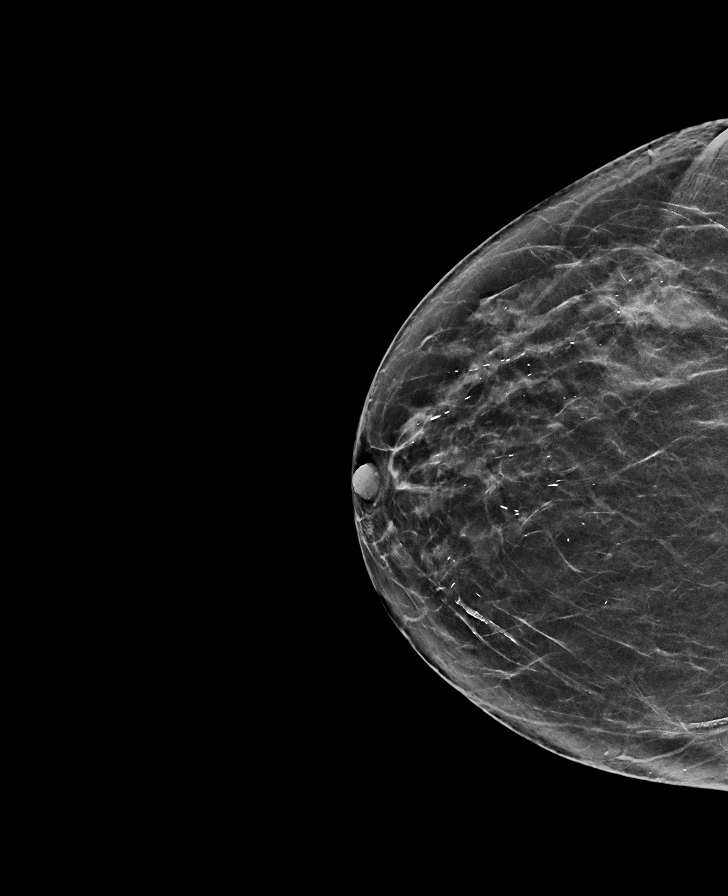

[L MLO synth-2D]
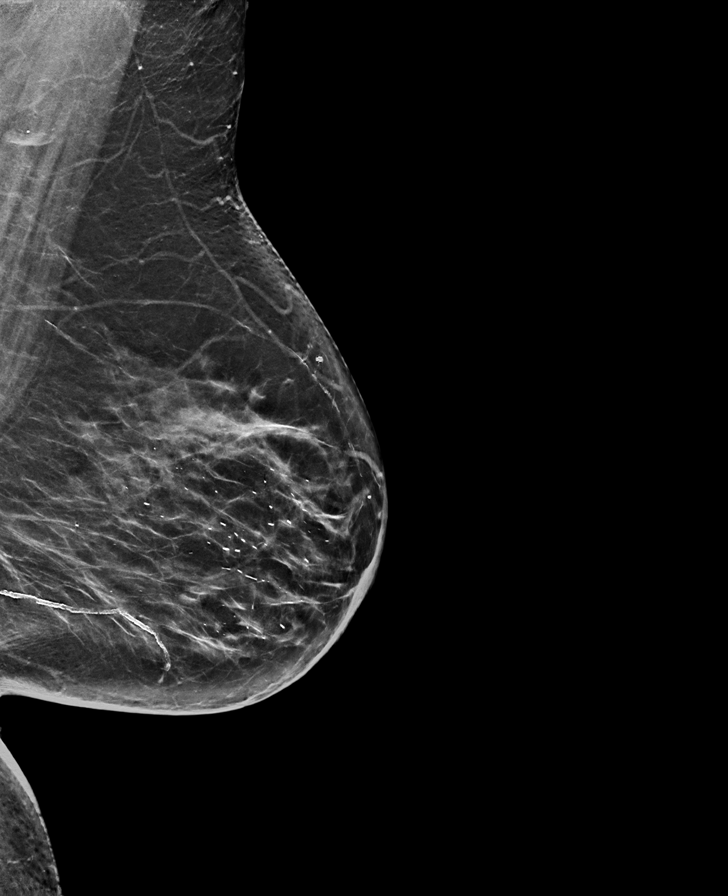

[R CC tomo · tomo slice 31/62.0]
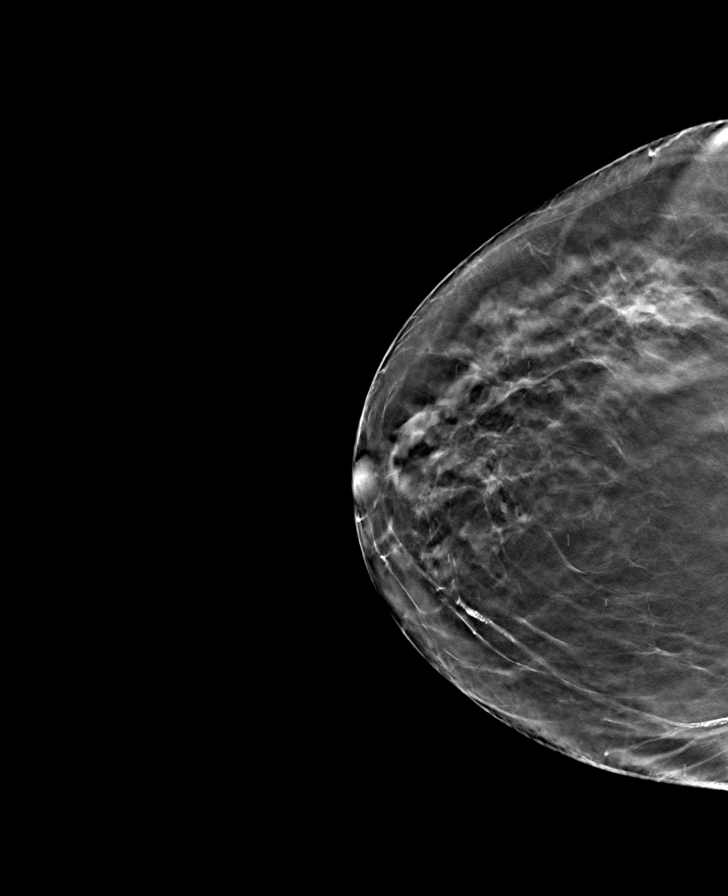

[L CC tomo · tomo slice 31/62.0]
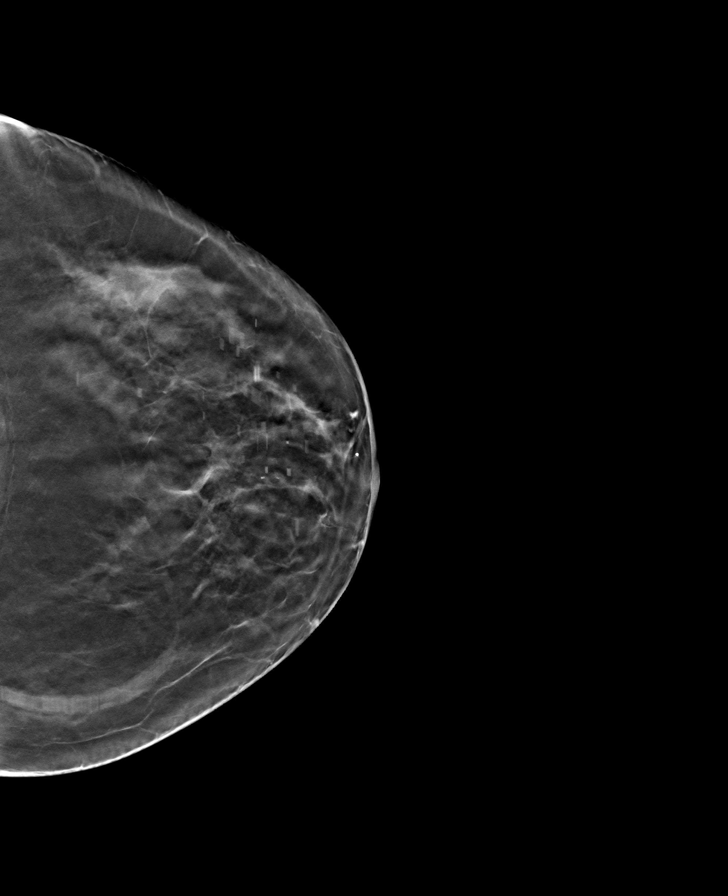

[L MLO tomo · tomo slice 36/71.0]
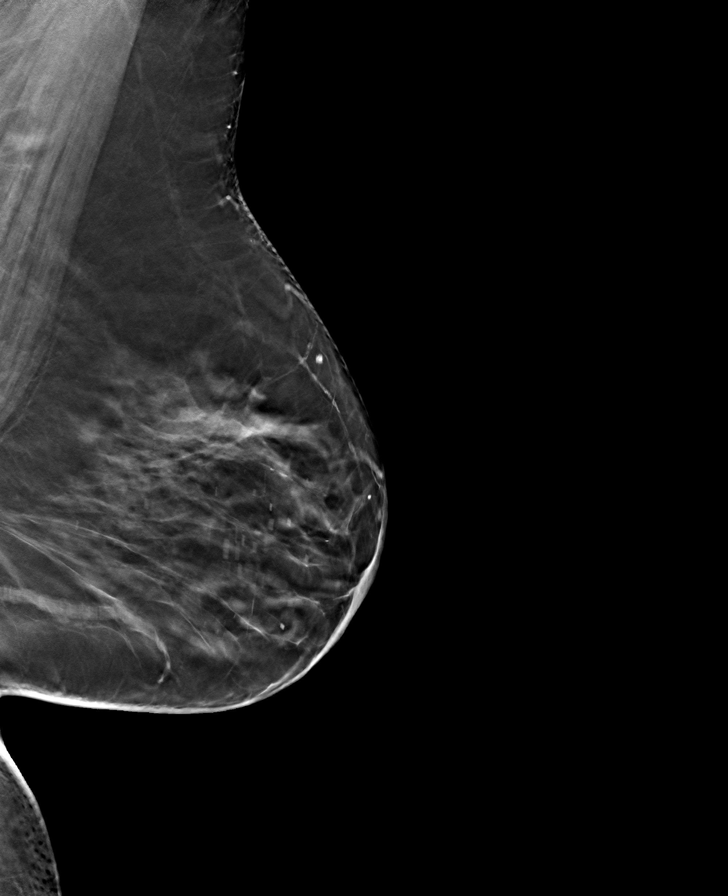

[R MLO tomo · tomo slice 33/65.0]
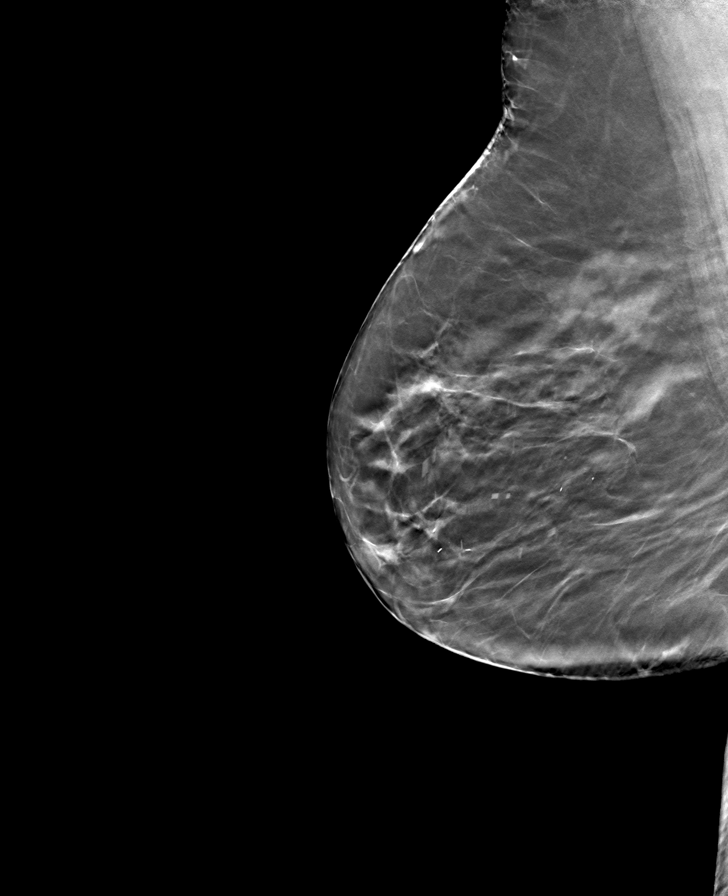

[8 of 24 positions shown; findings below may reference images not displayed]

ACR Breast Density Category c: The breast tissue is heterogeneously
dense, which may obscure small masses.
FINDINGS: There are no findings suspicious for malignancy. Images were
processed with CAD.
IMPRESSION: No mammographic evidence of malignancy. A result letter of this
screening mammogram will be mailed directly to the patient.

RECOMMENDATION:
Screening mammogram in one year. (Code:[id number]-[id number])

BI-RADS CATEGORY  1: Negative.

## 2022-10-26 ENCOUNTER — Ambulatory Visit (INDEPENDENT_AMBULATORY_CARE_PROVIDER_SITE_OTHER)
Admission: RE | Admit: 2022-10-26 | Discharge: 2022-10-26 | Disposition: A | Discharge status: Home / Self Care | Payer: Medicare PPO | Source: Ambulatory Visit | Attending: Family Medicine | Admitting: Family Medicine

## 2022-10-26 ENCOUNTER — Encounter: Payer: Self-pay | Admitting: Family Medicine

## 2022-10-26 ENCOUNTER — Ambulatory Visit: Payer: Medicare PPO | Admitting: Family Medicine

## 2022-10-26 VITALS — BP 132/70 | HR 89 | Temp 97.5°F | Ht 63.75 in | Wt 205.5 lb

## 2022-10-26 DIAGNOSIS — M25512 Pain in left shoulder: Secondary | ICD-10-CM

## 2022-10-26 DIAGNOSIS — G8929 Other chronic pain: Secondary | ICD-10-CM

## 2022-10-26 NOTE — Patient Instructions (Addendum)
Xray of shoulder today  We will contact you with a result   Use ice for 10 minutes whenever you can  Heat is ok also if it helps   Try voltaren (diclofenac) gel up four times daily   Keep moving - do passive range of motion  Avoid heavy lifting and overhead work   Use a pillow to off load the shoulder in bed if you can   Stop at check out to get appt with Dr Lorelei Pont

## 2022-10-26 NOTE — Progress Notes (Signed)
Subjective:    Patient ID: Brooke Mitchell, female    DOB: 10-21-1950, 72 y.o.   MRN: [id number]  HPI Pt presents for left arm pain   Wt Readings from Last 3 Encounters:  10/26/22 205 lb 8 oz (93.2 kg)  07/25/22 199 lb 2 oz (90.3 kg)  01/24/22 201 lb 8 oz (91.4 kg)   35.55 kg/m  Vitals:   10/26/22 1423  BP: 132/70  Pulse: 89  Temp: (!) 97.5 F (36.4 C)  SpO2: 96%   Pain in her L arm   She usually sleeps on L side  L arm has started to hurt when she lies on it     (no cp or sob) Hurts mainly above the elbow  Some pain reaching in back of her - like trying to secure bra  Also to reach and lift things  Vacuuming is hard   Some shoulder pain  Some tingling -all the way to the hand (4, 5th fingers only)   Has used some ice and heat   No swelling of shoulder or arm   Ortho- was at [facility]  Where she did her PT also  Takes tylenol  Uses bio freeze  Has used voltaren gel -helps a little   [place] last July and tore R rot cuff  Had to use the L arm more  When she has MRI was told both shoulders have rotator cuff pathology  Was told she may need shoulder replacement    Bruises easily   Patient Active Problem List   Diagnosis Date Noted   Encounter for screening mammogram for breast cancer 07/25/2022   Medicare annual wellness visit, subsequent 01/24/2022   COVID-19 07/13/2021   Cold hands and feet 10/08/2020   History of melanoma in situ 01/07/2018   Welcome to Medicare preventive visit 12/23/2016   Screening mammogram, encounter for 12/23/2016   Estrogen deficiency 12/23/2016   Neck pain 08/30/2016   Foot pain, left 12/23/2015   Urge incontinence 12/23/2015   Anemia, iron deficiency 02/19/2015   Vitamin B 12 deficiency 12/05/2014   Fatigue 11/26/2014   Left shoulder pain 11/26/2014   Grief reaction 01/22/2014   Colon cancer screening 10/24/2012   Class 1 obesity with serious comorbidity and body mass index (BMI) of 34.0 to 34.9 in adult 10/19/2011    Routine general medical examination at a health care facility 04/07/2011   UTI'S, RECURRENT 02/03/2010   Hypothyroidism 11/24/2006   Diabetes type 2, controlled ([place]) 11/24/2006   Hyperlipidemia associated with type 2 diabetes mellitus ([place]) 11/24/2006   RESTLESS LEG SYNDROME 11/24/2006   Essential hypertension 11/24/2006   PREMATURE VENTRICULAR CONTRACTIONS 11/24/2006   HEMORRHOIDS 11/24/2006   ALLERGIC RHINITIS 11/24/2006   GERD 11/24/2006   HIATAL HERNIA 11/24/2006   OVERACTIVE BLADDER 11/24/2006   [place] DISEASE, CERVICAL 11/24/2006   Sleep apnea 11/24/2006   Past Medical History:  Diagnosis Date   Allergic rhinitis    Allergy    Anxiety    Arthritis    osteoarthritis   Asthma    As a child    Depression    Diabetes mellitus    Type II (2/06 elevated microalbumin)   Frequent UTI    with coital prohylaxis    GERD (gastroesophageal reflux disease)    Hyperlipidemia    Hypertension    Hypothyroidism    IDA (iron deficiency anemia)    Obesity    OSA (obstructive sleep apnea)    CPAP   Sleep apnea  wears c-pap   Past Surgical History:  Procedure Laterality Date   BELPHAROPTOSIS REPAIR     COLONOSCOPY     DILATION AND CURETTAGE OF UTERUS     ENDOMETRIAL BIOPSY     FOOT SURGERY     UPPER GASTROINTESTINAL ENDOSCOPY     Social History   Tobacco Use   Smoking status: Never   Smokeless tobacco: Never  Vaping Use   Vaping Use: Never used  Substance Use Topics   Alcohol use: Yes    Alcohol/week: 0.0 standard drinks of alcohol    Comment: occasional   Drug use: No   Family History  Problem Relation Age of Onset   Diabetes Mother    Coronary artery disease Mother    Hypothyroidism Mother    Irritable bowel syndrome Mother    Alzheimer's disease Father    Nephrolithiasis Father    Hypothyroidism Brother    Breast cancer Paternal Grandmother 60   Colon cancer Neg Hx    Esophageal cancer Neg Hx    Rectal cancer Neg Hx    Stomach cancer Neg Hx    No  Known Allergies Current Outpatient Medications on File Prior to Visit  Medication Sig Dispense Refill   acetaminophen (TYLENOL) 325 MG tablet Take 650 mg by mouth as needed for pain.     albuterol (PROAIR HFA) 108 (90 Base) MCG/ACT inhaler INHALE TWO PUFFS BY MOUTH UP TO EVERY 4 HOURS AS NEEDED FOR WHEEZING 8.5 g 5   Ascorbic Acid (VITAMIN C) 100 MG tablet Take 100 mg by mouth daily.     atorvastatin (LIPITOR) 20 MG tablet Take 1 tablet (20 mg total) by mouth daily. 90 tablet 3   calcium carbonate (OS-CAL) 600 MG TABS tablet Take 600 mg by mouth 2 (two) times daily with a meal.     Cholecalciferol 4000 UNITS CAPS Take 4,000 Units by mouth daily. Pt takes 2 tablets of 2,000 units per day = 4,000 units a day     Cyanocobalamin (VITAMIN B-12 PO) Take 6 mcg by mouth daily.     fluticasone-salmeterol (ADVAIR) 100-50 MCG/ACT AEPB Inhale 1 puff into the lungs 2 (two) times daily. 1 each 3   glipiZIDE (GLUCOTROL XL) 10 MG 24 hr tablet Take 1 tablet (10 mg total) by mouth daily. 90 tablet 3   glucose blood (ACCU-CHEK GUIDE) test strip USE TO CHECK BLOOD SUGAR ONCE DAILY. Dx E11.9 100 strip 3   hyoscyamine (LEVSIN SL) 0.125 MG SL tablet Place one tablet under tongue once a day as needed. 30 tablet 1   IRON, FERROUS GLUCONATE, PO Take 1 tablet by mouth 2 (two) times daily.      levothyroxine (SYNTHROID) 75 MCG tablet Take 1 tablet (75 mcg total) by mouth daily before breakfast. 90 tablet 3   lisinopril-hydrochlorothiazide (ZESTORETIC) 10-12.5 MG tablet Take 1 tablet by mouth daily. 90 tablet 3   loratadine (CLARITIN) 10 MG tablet Take 10 mg by mouth daily.     metFORMIN (GLUCOPHAGE) 1000 MG tablet TAKE ONE TABLET BY MOUTH TWICE A DAY WITH A MEAL 180 tablet 3   multivitamin (THERAGRAN) per tablet Take 1 tablet by mouth daily.     sitaGLIPtin (JANUVIA) 100 MG tablet Take 1 tablet (100 mg total) by mouth daily. 90 tablet 3   No current facility-administered medications on file prior to visit.    Review of  Systems  Constitutional:  Negative for activity change, appetite change, fatigue, fever and unexpected weight change.  HENT:  Negative  for congestion, ear pain, rhinorrhea, sinus pressure and sore throat.   Eyes:  Negative for pain, redness and visual disturbance.  Respiratory:  Negative for cough, shortness of breath and wheezing.   Cardiovascular:  Negative for chest pain and palpitations.  Gastrointestinal:  Negative for abdominal pain, blood in stool, constipation and diarrhea.  Endocrine: Negative for polydipsia and polyuria.  Genitourinary:  Negative for dysuria, frequency and urgency.  Musculoskeletal:  Positive for arthralgias. Negative for back pain and myalgias.  Skin:  Negative for pallor and rash.  Allergic/Immunologic: Negative for environmental allergies.  Neurological:  Negative for dizziness, syncope and headaches.  Hematological:  Negative for adenopathy. Bruises/bleeds easily.  Psychiatric/Behavioral:  Negative for decreased concentration and dysphoric mood. The patient is not nervous/anxious.        Objective:   Physical Exam Constitutional:      General: She is not in acute distress.    Appearance: Normal appearance. She is obese. She is not ill-appearing or diaphoretic.  Eyes:     Conjunctiva/sclera: Conjunctivae normal.     Pupils: Pupils are equal, round, and reactive to light.  Cardiovascular:     Rate and Rhythm: Normal rate and regular rhythm.     Heart sounds: Normal heart sounds.  Pulmonary:     Effort: Pulmonary effort is normal. No respiratory distress.  Musculoskeletal:     Left shoulder: Tenderness present. No swelling, deformity, effusion, laceration or crepitus. Decreased range of motion. Normal strength. Normal pulse.     Cervical back: Normal range of motion and neck supple. No tenderness.     Comments: Shoulder left  No deformity/swelling/warmth or erythema  No crepitus  No obvious effusion  Abduction : full with some pain at end  [place]  test causes deltoid area pain Neer test causes deltoid area pain  Internal rotation limited by pain External rotation : fair with some discomfort  Tenderness over bicep and upper deltoid, no acromion tenderness  Normal grip and hand dexterity   No elbow or wrist tenderness    Lymphadenopathy:     Cervical: No cervical adenopathy.  Skin:    General: Skin is warm and dry.     Coloration: Skin is not pale.     Findings: No erythema or rash.     Comments: Some thin skin and old ecchymoses on forearms/worse on left   Neurological:     Mental Status: She is alert.     Sensory: No sensory deficit.     Motor: No weakness.     Deep Tendon Reflexes: Reflexes normal.  Psychiatric:        Mood and Affect: Mood normal.           Assessment & Plan:   Problem List Items Addressed This Visit       Other   Left shoulder pain - Primary    This worsened after R shoulder inj over summer (surgery) and had to overuse her L arm More pain now  Suspect arthritis and poss tendonitis  Rom is fair / most pain on int rotation  Hard to get comfortable in bed  Likely has arthritis  Xray today  Disc use of voltaren gel  Tylenol prn  Ice / heat prn  Continue rom exercises  Ref to sport med for visit- pt is interested in injection if that could help       Relevant Orders   DG Shoulder Left

## 2022-10-26 NOTE — Assessment & Plan Note (Signed)
This worsened after R shoulder inj over summer (surgery) and had to overuse her L arm More pain now  Suspect arthritis and poss tendonitis  Rom is fair / most pain on int rotation  Hard to get comfortable in bed  Likely has arthritis  Xray today  Disc use of voltaren gel  Tylenol prn  Ice / heat prn  Continue rom exercises  Ref to sport med for visit- pt is interested in injection if that could help

## 2022-11-01 NOTE — Progress Notes (Unsigned)
    Brooke Kleeman T. [name], MD, Brooke Mitchell at [facility] [place], [address]  Phone: [phone]  FAX: [place] - 72 y.o. female  MRN [id number]  Date of Birth: Sep 17, 1950  Date: 11/02/2022  PCP: Brooke Greenspan, MD  Referral: Brooke Greenspan, MD  No chief complaint on file.  Subjective:   Brooke Mitchell is a 72 y.o. very pleasant female patient with There is no height or weight on file to calculate BMI. who presents with the following:  She is a very pleasant lady, and I have seen her before for rotator cuff tendinopathy 8 years ago, and she presents today with some ongoing left-sided shoulder pain.  She saw my partner Dr. Glori Mitchell on October 26, 2022, and at that point she did obtain a plain shoulder series.  I independently reviewed those images today in the office, and she has no significant glenohumeral joint osteoarthritis.  She does have a history of a right-sided shoulder trauma over the summer, and she required rotator cuff repair.    Review of Systems is noted in the HPI, as appropriate  Objective:   There were no vitals taken for this visit.  GEN: No acute distress; alert,appropriate. PULM: Breathing comfortably in no respiratory distress PSYCH: Normally interactive.   Laboratory and Imaging Data:  Assessment and Plan:   ***

## 2022-11-02 ENCOUNTER — Encounter: Payer: Self-pay | Admitting: Family Medicine

## 2022-11-02 ENCOUNTER — Ambulatory Visit: Payer: Medicare PPO | Admitting: Family Medicine

## 2022-11-02 VITALS — BP 120/70 | HR 84 | Temp 97.7°F | Ht 63.75 in | Wt 202.1 lb

## 2022-11-02 DIAGNOSIS — M25512 Pain in left shoulder: Secondary | ICD-10-CM

## 2022-11-02 DIAGNOSIS — G8929 Other chronic pain: Secondary | ICD-10-CM

## 2022-11-02 DIAGNOSIS — G4733 Obstructive sleep apnea (adult) (pediatric): Secondary | ICD-10-CM | POA: Diagnosis not present

## 2022-11-02 DIAGNOSIS — M7582 Other shoulder lesions, left shoulder: Secondary | ICD-10-CM | POA: Diagnosis not present

## 2022-11-03 ENCOUNTER — Encounter: Payer: Self-pay | Admitting: Family Medicine

## 2022-12-14 ENCOUNTER — Ambulatory Visit (INDEPENDENT_AMBULATORY_CARE_PROVIDER_SITE_OTHER): Payer: Medicare PPO | Admitting: Family Medicine

## 2022-12-14 ENCOUNTER — Encounter: Payer: Self-pay | Admitting: Family Medicine

## 2022-12-14 DIAGNOSIS — M75100 Unspecified rotator cuff tear or rupture of unspecified shoulder, not specified as traumatic: Secondary | ICD-10-CM

## 2022-12-14 NOTE — Progress Notes (Unsigned)
   Subjective:    Patient ID: Brooke Mitchell, female    DOB: 10/22/1950, 72 y.o.   MRN: [id number]  HPI   Appt cancelled  DM2    Review of Systems     Objective:   Physical Exam        Assessment & Plan:

## 2022-12-22 ENCOUNTER — Ambulatory Visit: Payer: Medicare PPO | Admitting: Family Medicine

## 2023-01-10 ENCOUNTER — Telehealth: Payer: Self-pay | Admitting: Family Medicine

## 2023-01-10 DIAGNOSIS — E538 Deficiency of other specified B group vitamins: Secondary | ICD-10-CM

## 2023-01-10 DIAGNOSIS — E119 Type 2 diabetes mellitus without complications: Secondary | ICD-10-CM

## 2023-01-10 DIAGNOSIS — E1169 Type 2 diabetes mellitus with other specified complication: Secondary | ICD-10-CM

## 2023-01-10 DIAGNOSIS — I10 Essential (primary) hypertension: Secondary | ICD-10-CM

## 2023-01-10 DIAGNOSIS — E039 Hypothyroidism, unspecified: Secondary | ICD-10-CM

## 2023-01-10 DIAGNOSIS — D509 Iron deficiency anemia, unspecified: Secondary | ICD-10-CM

## 2023-01-10 NOTE — Telephone Encounter (Signed)
-----   Message from Alvina Chou sent at 01/09/2023  1:06 PM EDT ----- Regarding: Lab orders for  Wednesday, 5.8.24 Patient is scheduled for CPX labs, please order future labs, Thanks , Camelia Eng

## 2023-01-18 ENCOUNTER — Telehealth: Payer: Self-pay | Admitting: Family Medicine

## 2023-01-18 ENCOUNTER — Other Ambulatory Visit (INDEPENDENT_AMBULATORY_CARE_PROVIDER_SITE_OTHER): Payer: Medicare PPO

## 2023-01-18 ENCOUNTER — Other Ambulatory Visit: Payer: Self-pay | Admitting: Family Medicine

## 2023-01-18 DIAGNOSIS — D509 Iron deficiency anemia, unspecified: Secondary | ICD-10-CM | POA: Diagnosis not present

## 2023-01-18 DIAGNOSIS — I10 Essential (primary) hypertension: Secondary | ICD-10-CM

## 2023-01-18 DIAGNOSIS — E785 Hyperlipidemia, unspecified: Secondary | ICD-10-CM

## 2023-01-18 DIAGNOSIS — E1169 Type 2 diabetes mellitus with other specified complication: Secondary | ICD-10-CM | POA: Diagnosis not present

## 2023-01-18 DIAGNOSIS — E538 Deficiency of other specified B group vitamins: Secondary | ICD-10-CM

## 2023-01-18 DIAGNOSIS — E039 Hypothyroidism, unspecified: Secondary | ICD-10-CM | POA: Diagnosis not present

## 2023-01-18 DIAGNOSIS — E119 Type 2 diabetes mellitus without complications: Secondary | ICD-10-CM

## 2023-01-18 DIAGNOSIS — E2839 Other primary ovarian failure: Secondary | ICD-10-CM

## 2023-01-18 LAB — CBC WITH DIFFERENTIAL/PLATELET
Basophils Absolute: 0.1 10*3/uL (ref 0.0–0.1)
Basophils Relative: 0.9 % (ref 0.0–3.0)
Eosinophils Absolute: 0.2 10*3/uL (ref 0.0–0.7)
Eosinophils Relative: 3.3 % (ref 0.0–5.0)
HCT: 39.3 % (ref 36.0–46.0)
Hemoglobin: 13.4 g/dL (ref 12.0–15.0)
Lymphocytes Relative: 29.8 % (ref 12.0–46.0)
Lymphs Abs: 1.9 10*3/uL (ref 0.7–4.0)
MCHC: 34 g/dL (ref 30.0–36.0)
MCV: 90.4 fl (ref 78.0–100.0)
Monocytes Absolute: 0.4 10*3/uL (ref 0.1–1.0)
Monocytes Relative: 6.9 % (ref 3.0–12.0)
Neutro Abs: 3.7 10*3/uL (ref 1.4–7.7)
Neutrophils Relative %: 59.1 % (ref 43.0–77.0)
Platelets: 284 10*3/uL (ref 150.0–400.0)
RBC: 4.35 Mil/uL (ref 3.87–5.11)
RDW: 12.8 % (ref 11.5–15.5)
WBC: 6.3 10*3/uL (ref 4.0–10.5)

## 2023-01-18 LAB — COMPREHENSIVE METABOLIC PANEL
ALT: 13 U/L (ref 0–35)
AST: 16 U/L (ref 0–37)
Albumin: 4.2 g/dL (ref 3.5–5.2)
Alkaline Phosphatase: 43 U/L (ref 39–117)
BUN: 17 mg/dL (ref 6–23)
CO2: 29 mEq/L (ref 19–32)
Calcium: 9.3 mg/dL (ref 8.4–10.5)
Chloride: 102 mEq/L (ref 96–112)
Creatinine, Ser: 1.14 mg/dL (ref 0.40–1.20)
GFR: 48.28 mL/min — ABNORMAL LOW (ref >60.00)
Glucose, Bld: 137 mg/dL — ABNORMAL HIGH (ref 70–99)
Potassium: 4.1 mEq/L (ref 3.5–5.1)
Sodium: 139 mEq/L (ref 135–145)
Total Bilirubin: 0.5 mg/dL (ref 0.2–1.2)
Total Protein: 6.5 g/dL (ref 6.0–8.3)

## 2023-01-18 LAB — LIPID PANEL
Cholesterol: 179 mg/dL (ref 0–200)
HDL: 46.2 mg/dL (ref >39.00)
NonHDL: 133.08
Total CHOL/HDL Ratio: 4
Triglycerides: 235 mg/dL — ABNORMAL HIGH (ref 0.0–149.0)
VLDL: 47 mg/dL — ABNORMAL HIGH (ref 0.0–40.0)

## 2023-01-18 LAB — MICROALBUMIN / CREATININE URINE RATIO
Creatinine,U: 158.8 mg/dL
Microalb Creat Ratio: 1.6 mg/g (ref 0.0–30.0)
Microalb, Ur: 2.5 mg/dL — ABNORMAL HIGH (ref 0.0–1.9)

## 2023-01-18 LAB — IRON: Iron: 88 ug/dL (ref 42–145)

## 2023-01-18 LAB — TSH: TSH: 3.41 u[IU]/mL (ref 0.35–5.50)

## 2023-01-18 LAB — HEMOGLOBIN A1C: Hgb A1c MFr Bld: 6.5 % (ref 4.6–6.5)

## 2023-01-18 LAB — VITAMIN B12: Vitamin B-12: 153 pg/mL — ABNORMAL LOW (ref 211–911)

## 2023-01-18 LAB — LDL CHOLESTEROL, DIRECT: Direct LDL: 106 mg/dL

## 2023-01-18 NOTE — Telephone Encounter (Signed)
-----   Message from Alvina Chou sent at 01/18/2023  7:56 AM EDT ----- Can you please order her bone density, I released it in error, thanks

## 2023-01-18 NOTE — Telephone Encounter (Signed)
I ordered for [facility] one  She may need the phone # to schedule it   Thanks

## 2023-01-25 ENCOUNTER — Encounter: Payer: Medicare PPO | Admitting: Family Medicine

## 2023-01-27 ENCOUNTER — Ambulatory Visit (INDEPENDENT_AMBULATORY_CARE_PROVIDER_SITE_OTHER): Payer: Medicare PPO | Admitting: Family Medicine

## 2023-01-27 ENCOUNTER — Encounter: Payer: Self-pay | Admitting: Family Medicine

## 2023-01-27 VITALS — BP 118/74 | HR 89 | Temp 97.6°F | Ht 63.75 in | Wt 196.0 lb

## 2023-01-27 DIAGNOSIS — Z0000 Encounter for general adult medical examination without abnormal findings: Secondary | ICD-10-CM | POA: Diagnosis not present

## 2023-01-27 DIAGNOSIS — E119 Type 2 diabetes mellitus without complications: Secondary | ICD-10-CM

## 2023-01-27 DIAGNOSIS — E1159 Type 2 diabetes mellitus with other circulatory complications: Secondary | ICD-10-CM | POA: Diagnosis not present

## 2023-01-27 DIAGNOSIS — E1169 Type 2 diabetes mellitus with other specified complication: Secondary | ICD-10-CM

## 2023-01-27 DIAGNOSIS — E538 Deficiency of other specified B group vitamins: Secondary | ICD-10-CM | POA: Diagnosis not present

## 2023-01-27 DIAGNOSIS — Z7984 Long term (current) use of oral hypoglycemic drugs: Secondary | ICD-10-CM

## 2023-01-27 DIAGNOSIS — Z1283 Encounter for screening for malignant neoplasm of skin: Secondary | ICD-10-CM

## 2023-01-27 DIAGNOSIS — E6609 Other obesity due to excess calories: Secondary | ICD-10-CM

## 2023-01-27 DIAGNOSIS — E039 Hypothyroidism, unspecified: Secondary | ICD-10-CM

## 2023-01-27 DIAGNOSIS — Z6834 Body mass index (BMI) 34.0-34.9, adult: Secondary | ICD-10-CM

## 2023-01-27 DIAGNOSIS — E2839 Other primary ovarian failure: Secondary | ICD-10-CM

## 2023-01-27 DIAGNOSIS — E785 Hyperlipidemia, unspecified: Secondary | ICD-10-CM

## 2023-01-27 DIAGNOSIS — Z86006 Personal history of melanoma in-situ: Secondary | ICD-10-CM

## 2023-01-27 DIAGNOSIS — M75100 Unspecified rotator cuff tear or rupture of unspecified shoulder, not specified as traumatic: Secondary | ICD-10-CM

## 2023-01-27 DIAGNOSIS — I152 Hypertension secondary to endocrine disorders: Secondary | ICD-10-CM

## 2023-01-27 MED ORDER — ACCU-CHEK GUIDE VI STRP: ORAL_STRIP | 3 refills | Status: DC

## 2023-01-27 MED ORDER — ATORVASTATIN CALCIUM 40 MG PO TABS
40.0000 mg | ORAL_TABLET | Freq: Every day | ORAL | 3 refills | Status: DC
Start: 2023-01-27 — End: 2024-01-30

## 2023-01-27 MED ORDER — METHOCARBAMOL 500 MG PO TABS: 500.0000 mg | ORAL_TABLET | Freq: Four times a day (QID) | ORAL | 3 refills | Status: AC | PRN

## 2023-01-27 NOTE — Progress Notes (Unsigned)
Subjective:    Patient ID: Brooke Mitchell, female    DOB: September 10, 1950, 72 y.o.   MRN: [id number]  HPI Here for health maintenance exam and to review chronic medical problems    Wt Readings from Last 3 Encounters:  01/27/23 196 lb (88.9 kg)  11/02/22 202 lb 2 oz (91.7 kg)  10/26/22 205 lb 8 oz (93.2 kg)   33.91 kg/m  Vitals:   01/27/23 1021  BP: 118/74  Pulse: 89  Temp: 97.6 F (36.4 C)  SpO2: 96%   Shoulder is doing better   More problems with feet lately  Bottom of L foot  Yesterday R ankle bothered her  She wore her orthopedic shoes today and it helps  Takes a muscle relaxer as needed  for occ arm and leg/ foot pain      Immunization History  Administered Date(s) Administered   COVID-19, mRNA, vaccine(Comirnaty)12 years and older 06/03/2022   Fluad Quad(high Dose 65+) 05/08/2019, 05/30/2020   H1N1 09/22/2008   Influenza Split 05/31/2012   Influenza Whole 06/05/2006, 06/29/2007, 07/09/2009, 08/04/2010   Influenza, High Dose Seasonal PF 06/10/2021, 05/02/2022   Influenza,inj,Quad PF,6+ Mos 06/19/2013, 06/25/2014, 06/16/2015, 06/14/2016, 06/14/2017, 05/25/2018   PFIZER(Purple Top)SARS-COV-2 Vaccination 09/25/2019, 10/16/2019, 06/17/2020   Pfizer Covid-19 Vaccine Bivalent Booster 30yrs & up 06/10/2021   Pneumococcal Conjugate-13 12/23/2016   Pneumococcal Polysaccharide-23 04/03/2009, 12/29/2017   Respiratory Syncytial Virus Vaccine,Recomb Aduvanted(Arexvy) 05/02/2022   Td 01/04/2004, 03/31/2010   Tdap 08/23/2016   Zoster Recombinat (Shingrix) 07/28/2017, 03/24/2018   Zoster, Live 04/29/2011   Health Maintenance Due  Topic Date Due   OPHTHALMOLOGY EXAM  04/15/2021   COVID-19 Vaccine (6 - 2023-24 season) 07/29/2022   Medicare Annual Wellness (AWV)  01/25/2023   Eye exam- has one soon   Mammogram 08/2022 Self breast exam- no lumps   Colonoscopy 01/2015 with 10 y recall  Dexa  12/2016 normal ([place]) Falls: none Fractures:none  Supplements  Exercise:   some /has weights   Pool opens tomorrow at her condo and has signed up for water aerobics   Mood    01/27/2023   10:24 AM 10/26/2022    2:34 PM 01/24/2022    2:22 PM 01/13/2021   10:49 AM 01/13/2020   10:33 AM  Depression screen PHQ 2/9  Decreased Interest 0 0 0 0 0  Down, Depressed, Hopeless 0 0 0 0 0  PHQ - 2 Score 0 0 0 0 0  Altered sleeping 0 1  0 0  Tired, decreased energy 0 0  0 0  Change in appetite 0 0  0 0  Feeling bad or failure about yourself  0 0  0 0  Trouble concentrating 0 0  0 0  Moving slowly or fidgety/restless 0 0  0 0  Suicidal thoughts 0 0  0 0  PHQ-9 Score 0 1  0 0  Difficult doing work/chores Not difficult at all Not difficult at all  Not difficult at all Not difficult at all     Derm care : not recently  Needs to follow up  Wants ref to new practice Is very good about sun protection     Hypothyroidism  Pt has no clinical changes No change in energy level/ hair or skin/ edema and no tremor Lab Results  Component Value Date   TSH 3.41 01/18/2023    Levothyroxine 75 mcg  HTN bp is stable today  No cp or palpitations or headaches or edema  No side effects to medicines  BP Readings from Last 3 Encounters:  01/27/23 118/74  11/02/22 120/70  10/26/22 132/70    Lisinopril hct 10-12.5 mg daily  Last metabolic panel Lab Results  Component Value Date   GLUCOSE 137 (H) 01/18/2023   NA 139 01/18/2023   K 4.1 01/18/2023   CL 102 01/18/2023   CO2 29 01/18/2023   BUN 17 01/18/2023   CREATININE 1.14 01/18/2023   CALCIUM 9.3 01/18/2023   PHOS 3.6 10/07/2010   PROT 6.5 01/18/2023   ALBUMIN 4.2 01/18/2023   BILITOT 0.5 01/18/2023   ALKPHOS 43 01/18/2023   AST 16 01/18/2023   ALT 13 01/18/2023      DM2 Lab Results  Component Value Date   HGBA1C 6.5 01/18/2023   Januvia 100 mg daily  Glipizide xl 10 mg daily  Metformin 1000 mg bid     Lab Results  Component Value Date   MICROALBUR 2.5 (H) 01/18/2023   MICROALBUR 0.9 01/17/2022    Diet is ok overall    Hyperlipidemia Lab Results  Component Value Date   CHOL 179 01/18/2023   CHOL 176 07/25/2022   CHOL 166 01/17/2022   Lab Results  Component Value Date   HDL 46.20 01/18/2023   HDL 46.10 07/25/2022   HDL 47.60 01/17/2022   Lab Results  Component Value Date   LDLCALC 69 03/03/2021   LDLCALC 94 01/13/2021   LDLCALC 90 07/16/2020   Lab Results  Component Value Date   TRIG 235.0 (H) 01/18/2023   TRIG 282.0 (H) 07/25/2022   TRIG 220.0 (H) 01/17/2022   Lab Results  Component Value Date   CHOLHDL 4 01/18/2023   CHOLHDL 4 07/25/2022   CHOLHDL 3 01/17/2022   Lab Results  Component Value Date   LDLDIRECT 106.0 01/18/2023   LDLDIRECT 104.0 07/25/2022   LDLDIRECT 99.0 01/17/2022   Atorvastatin 20 mg daily  She does have some arm/leg pain on /off - unsure if that is cause  Open to going up on it   B12 def in past  Lab Results  Component Value Date   VITAMINB12 153 (L) 01/18/2023   Was prevention high- held her dose   Lab Results  Component Value Date   IRON 88 01/18/2023   TIBC 289 05/05/2017   FERRITIN 86 05/05/2017   Lab Results  Component Value Date   WBC 6.3 01/18/2023   HGB 13.4 01/18/2023   HCT 39.3 01/18/2023   MCV 90.4 01/18/2023   PLT 284.0 01/18/2023   Patient Active Problem List   Diagnosis Date Noted   Skin cancer screening 01/27/2023   Encounter for screening mammogram for breast cancer 07/25/2022   Torn rotator cuff 03/18/2022   Medicare annual wellness visit, subsequent 01/24/2022   Cold hands and feet 10/08/2020   History of melanoma in situ 01/07/2018   Welcome to Medicare preventive visit 12/23/2016   Screening mammogram, encounter for 12/23/2016   Estrogen deficiency 12/23/2016   Foot pain, left 12/23/2015   Urge incontinence 12/23/2015   Anemia, iron deficiency 02/19/2015   Vitamin B 12 deficiency 12/05/2014   Fatigue 11/26/2014   Left shoulder pain 11/26/2014   Grief reaction 01/22/2014   Colon cancer  screening 10/24/2012   Class 1 obesity with serious comorbidity and body mass index (BMI) of 34.0 to 34.9 in adult 10/19/2011   Routine general medical examination at a health care facility 04/07/2011   UTI'S, RECURRENT 02/03/2010   Hypothyroidism 11/24/2006   Diabetes type 2, controlled (HCC) 11/24/2006   Hyperlipidemia associated  with type 2 diabetes mellitus (HCC) 11/24/2006   RESTLESS LEG SYNDROME 11/24/2006   Hypertension associated with diabetes (HCC) 11/24/2006   PREMATURE VENTRICULAR CONTRACTIONS 11/24/2006   HEMORRHOIDS 11/24/2006   ALLERGIC RHINITIS 11/24/2006   GERD 11/24/2006   HIATAL HERNIA 11/24/2006   OVERACTIVE BLADDER 11/24/2006   DISC DISEASE, CERVICAL 11/24/2006   Sleep apnea 11/24/2006   Past Medical History:  Diagnosis Date   Allergic rhinitis    Allergy    Anxiety    Arthritis    osteoarthritis   Asthma    As a child    Cataract Mother--Donna   Depression    Diabetes mellitus    Type II (2/06 elevated microalbumin)   Frequent UTI    with coital prohylaxis    GERD (gastroesophageal reflux disease)    Glaucoma Mother--Donna   Hyperlipidemia    Hypertension    Hypothyroidism    IDA (iron deficiency anemia)    Obesity    OSA (obstructive sleep apnea)    CPAP   Sleep apnea    wears c-pap   Past Surgical History:  Procedure Laterality Date   BELPHAROPTOSIS REPAIR     COLONOSCOPY     DILATION AND CURETTAGE OF UTERUS     ENDOMETRIAL BIOPSY     FOOT SURGERY     UPPER GASTROINTESTINAL ENDOSCOPY     Social History   Tobacco Use   Smoking status: Never   Smokeless tobacco: Never  Vaping Use   Vaping Use: Never used  Substance Use Topics   Alcohol use: Not Currently    Comment: Occasional glass of wine or mixed drink   Drug use: Never   Family History  Problem Relation Age of Onset   Diabetes Mother    Coronary artery disease Mother    Hypothyroidism Mother    Irritable bowel syndrome Mother    Hyperlipidemia Mother    Hypertension  Mother    Obesity Mother    Vision loss Mother    Alzheimer's disease Father    Nephrolithiasis Father    Arthritis Father    Varicose Veins Father    Hypothyroidism Brother    Breast cancer Paternal Grandmother 55   Colon cancer Neg Hx    Esophageal cancer Neg Hx    Rectal cancer Neg Hx    Stomach cancer Neg Hx    No Known Allergies Current Outpatient Medications on File Prior to Visit  Medication Sig Dispense Refill   acetaminophen (TYLENOL) 325 MG tablet Take 650 mg by mouth as needed for pain.     albuterol (PROAIR HFA) 108 (90 Base) MCG/ACT inhaler INHALE TWO PUFFS BY MOUTH UP TO EVERY 4 HOURS AS NEEDED FOR WHEEZING 8.5 g 5   Ascorbic Acid (VITAMIN C) 100 MG tablet Take 100 mg by mouth daily.     calcium carbonate (OS-CAL) 600 MG TABS tablet Take 600 mg by mouth 2 (two) times daily with a meal.     Cholecalciferol 4000 UNITS CAPS Take 4,000 Units by mouth daily. Pt takes 2 tablets of 2,000 units per day = 4,000 units a day     Cyanocobalamin (VITAMIN B-12 PO) Take 6 mcg by mouth daily.     fluticasone-salmeterol (ADVAIR) 100-50 MCG/ACT AEPB Inhale 1 puff into the lungs 2 (two) times daily as needed.     glipiZIDE (GLUCOTROL XL) 10 MG 24 hr tablet Take 1 tablet (10 mg total) by mouth daily. 90 tablet 3   hyoscyamine (LEVSIN SL) 0.125 MG SL tablet Place  one tablet under tongue once a day as needed. 30 tablet 1   IRON, FERROUS GLUCONATE, PO Take 1 tablet by mouth 2 (two) times daily.      levothyroxine (SYNTHROID) 75 MCG tablet Take 1 tablet (75 mcg total) by mouth daily before breakfast. 90 tablet 3   lisinopril-hydrochlorothiazide (ZESTORETIC) 10-12.5 MG tablet TAKE 1 TABLET BY MOUTH DAILY 90 tablet 0   loratadine (CLARITIN) 10 MG tablet Take 10 mg by mouth daily.     metFORMIN (GLUCOPHAGE) 1000 MG tablet TAKE 1 TABLET BY MOUTH TWICE A DAY WITH MEALS 180 tablet 0   multivitamin (THERAGRAN) per tablet Take 1 tablet by mouth daily.     sitaGLIPtin (JANUVIA) 100 MG tablet TAKE 1  TABLET BY MOUTH DAILY 90 tablet 0   No current facility-administered medications on file prior to visit.    Review of Systems  Constitutional:  Negative for activity change, appetite change, fatigue, fever and unexpected weight change.  HENT:  Negative for congestion, ear pain, rhinorrhea, sinus pressure and sore throat.   Eyes:  Negative for pain, redness and visual disturbance.  Respiratory:  Negative for cough, shortness of breath and wheezing.   Cardiovascular:  Negative for chest pain and palpitations.  Gastrointestinal:  Negative for abdominal pain, blood in stool, constipation and diarrhea.  Endocrine: Negative for polydipsia and polyuria.  Genitourinary:  Negative for dysuria, frequency and urgency.  Musculoskeletal:  Positive for arthralgias. Negative for back pain and myalgias.  Skin:  Negative for pallor and rash.  Allergic/Immunologic: Negative for environmental allergies.  Neurological:  Negative for dizziness, syncope and headaches.  Hematological:  Negative for adenopathy. Does not bruise/bleed easily.  Psychiatric/Behavioral:  Negative for decreased concentration and dysphoric mood. The patient is not nervous/anxious.        Objective:   Physical Exam Constitutional:      General: She is not in acute distress.    Appearance: Normal appearance. She is well-developed. She is obese. She is not ill-appearing or diaphoretic.  HENT:     Head: Normocephalic and atraumatic.     Right Ear: Tympanic membrane, ear canal and external ear normal.     Left Ear: Tympanic membrane, ear canal and external ear normal.     Nose: Nose normal. No congestion.     Mouth/Throat:     Mouth: Mucous membranes are moist.     Pharynx: Oropharynx is clear. No posterior oropharyngeal erythema.  Eyes:     General: No scleral icterus.    Extraocular Movements: Extraocular movements intact.     Conjunctiva/sclera: Conjunctivae normal.     Pupils: Pupils are equal, round, and reactive to light.   Neck:     Thyroid: No thyromegaly.     Vascular: No carotid bruit or JVD.  Cardiovascular:     Rate and Rhythm: Normal rate and regular rhythm.     Pulses: Normal pulses.     Heart sounds: Normal heart sounds.     No gallop.  Pulmonary:     Effort: Pulmonary effort is normal. No respiratory distress.     Breath sounds: Normal breath sounds. No wheezing.     Comments: Good air exch Chest:     Chest wall: No tenderness.  Abdominal:     General: Bowel sounds are normal. There is no distension or abdominal bruit.     Palpations: Abdomen is soft. There is no mass.     Tenderness: There is no abdominal tenderness.     Hernia: No hernia is  present.  Genitourinary:    Comments: Breast exam: No mass, nodules, thickening, tenderness, bulging, retraction, inflamation, nipple discharge or skin changes noted.  No axillary or clavicular LA.     Musculoskeletal:        General: No tenderness. Normal range of motion.     Cervical back: Normal range of motion and neck supple. No rigidity. No muscular tenderness.     Right lower leg: No edema.     Left lower leg: No edema.     Comments: No kyphosis   Lymphadenopathy:     Cervical: No cervical adenopathy.  Skin:    General: Skin is warm and dry.     Coloration: Skin is not pale.     Findings: No erythema or rash.     Comments: Solar lentigines diffusely   Neurological:     Mental Status: She is alert. Mental status is at baseline.     Cranial Nerves: No cranial nerve deficit.     Motor: No abnormal muscle tone.     Coordination: Coordination normal.     Gait: Gait normal.     Deep Tendon Reflexes: Reflexes are normal and symmetric. Reflexes normal.  Psychiatric:        Mood and Affect: Mood normal.        Cognition and Memory: Cognition and memory normal.           Assessment & Plan:   Problem List Items Addressed This Visit       Cardiovascular and Mediastinum   Hypertension associated with diabetes (HCC)    bp in fair  control at this time  BP Readings from Last 1 Encounters:  01/27/23 118/74  No changes needed Most recent labs reviewed  Disc lifstyle change with low sodium diet and exercise  Plan to continue lisinopril hct 10-12.5 mg daily        Relevant Medications   atorvastatin (LIPITOR) 40 MG tablet     Endocrine   Hypothyroidism    Hypothyroidism  Pt has no clinical changes No change in energy level/ hair or skin/ edema and no tremor Lab Results  Component Value Date   TSH 3.41 01/18/2023    Continues levothyroxine 75 mcg daily      Hyperlipidemia associated with type 2 diabetes mellitus (HCC)    Disc goals for lipids and reasons to control them Rev last labs with pt Rev low sat fat diet in detail  LDL of 69- improved  Atorvastatin 20 mg daily is tolerated well      Relevant Medications   atorvastatin (LIPITOR) 40 MG tablet   Diabetes type 2, controlled (HCC)    Lab Results  Component Value Date   HGBA1C 6.5 01/18/2023  Well controlled Januvia 100 mg daily  Glipizide xl 10 mg daily  Metformin 1000 mg bid   Microalb utd  Diet is improved  Encouraged more exercise as tolerated      Relevant Medications   atorvastatin (LIPITOR) 40 MG tablet     Musculoskeletal and Integument   Torn rotator cuff    Overall doing better  Doing PT      History of melanoma in situ    Due for dermatology visit-referral done Discussed sun protection         Other   Vitamin B 12 deficiency    Lab Results  Component Value Date   VITAMINB12 153 (L) 01/18/2023  This is back down after holding dose for high level  Encouraged her to start  back on 1000 mcg b12 over the counter daily      Skin cancer screening    Ref to derm H/o melanoma in situ in past      Relevant Orders   Ambulatory referral to Dermatology   Routine general medical examination at a health care facility - Primary    Reviewed health habits including diet and exercise and skin cancer prevention Reviewed  appropriate screening tests for age  Also reviewed health mt list, fam hx and immunization status , as well as social and family history   See HPI Labs reviewed and ordered  Has eye exam planned soon  Mammogram 08/2022  Colonoscopy 08/2015 with 10 y recall Dexa ordered  PHQ 0 Planning dermatology f/u      Estrogen deficiency    Dexa ordered      Relevant Orders   DG Bone Density   Class 1 obesity with serious comorbidity and body mass index (BMI) of 34.0 to 34.9 in adult    Has lost 9 lb since February  Commended Discussed how this problem influences overall health and the risks it imposes  Reviewed plan for weight loss with lower calorie diet (via better food choices and also portion control or program like weight watchers) and exercise building up to or more than 30 minutes 5 days per week including some aerobic activity  Encouraged to add strength traiing

## 2023-01-27 NOTE — Patient Instructions (Addendum)
Please get your eye exam and have them send [place] a copy    Add some strength training to your routine, this is important for bone and brain health and can reduce your risk of falls and help your body use insulin properly and regulate weight  Light weights, exercise bands , and internet videos are a good way to start  Yoga (chair or regular), machines , floor exercises or a gym with machines are also good options   Get back on your vitamin B12 daily  1000 mcg daily   Go up on atorvastatin to 40 mg daily   Schedule fasting lab for 6 weeks for cholesterol and B12    You have an order for:  []   2D Mammogram  []   3D Mammogram  [x]   Bone Density     Please call for appointment:   [x]   [facility]  [address] [place] [address]  [phone]  []   [facility])   [address]  [place], [place] [address]  [phone]  []   [facility]      [address] [place], [place]        [phone]         []   [facility]  [address] [place], [place]  [phone]  []  [facility]   [address]   [place], [place] [address]  [phone]  []  [facility]  [address] [place], [place] [address] [phone]    Make sure to wear two piece clothing  No lotions powders or deodorants the day of the appointment Make sure to bring picture ID and insurance card.  Bring list of medications you are currently taking including any supplements.   Schedule your screening mammogram through MyChart!   Select [place] imaging sites can now be scheduled through MyChart.  Log into your MyChart account.  Go to 'Visit' (or 'Appointments' if  on mobile App) --> Schedule an  Appointment  Under 'Select a Reason for Visit' choose  the Mammogram  Screening option.  Complete the pre-visit questions  and select the time and place that  best fits your schedule

## 2023-01-30 NOTE — Assessment & Plan Note (Signed)
Hypothyroidism  Pt has no clinical changes No change in energy level/ hair or skin/ edema and no tremor Lab Results  Component Value Date   TSH 3.41 01/18/2023    Continues levothyroxine 75 mcg daily

## 2023-01-30 NOTE — Assessment & Plan Note (Signed)
Dexa ordered

## 2023-01-30 NOTE — Assessment & Plan Note (Addendum)
Due for dermatology visit-referral done Discussed sun protection

## 2023-01-30 NOTE — Assessment & Plan Note (Signed)
Ref to derm H/o melanoma in situ in past

## 2023-01-30 NOTE — Assessment & Plan Note (Signed)
Reviewed health habits including diet and exercise and skin cancer prevention Reviewed appropriate screening tests for age  Also reviewed health mt list, fam hx and immunization status , as well as social and family history   See HPI Labs reviewed and ordered  Has eye exam planned soon  Mammogram 08/2022  Colonoscopy 08/2015 with 10 y recall Dexa ordered  PHQ 0 Planning dermatology f/u

## 2023-01-30 NOTE — Assessment & Plan Note (Signed)
Lab Results  Component Value Date   VITAMINB12 153 (L) 01/18/2023   This is back down after holding dose for high level  Encouraged her to start back on 1000 mcg b12 over the counter daily

## 2023-01-30 NOTE — Assessment & Plan Note (Signed)
Disc goals for lipids and reasons to control them Rev last labs with pt Rev low sat fat diet in detail  LDL of 69- improved  Atorvastatin 20 mg daily is tolerated well

## 2023-01-30 NOTE — Assessment & Plan Note (Signed)
bp in fair control at this time  BP Readings from Last 1 Encounters:  01/27/23 118/74   No changes needed Most recent labs reviewed  Disc lifstyle change with low sodium diet and exercise  Plan to continue lisinopril hct 10-12.5 mg daily

## 2023-01-30 NOTE — Assessment & Plan Note (Signed)
Has lost 9 lb since February  Commended Discussed how this problem influences overall health and the risks it imposes  Reviewed plan for weight loss with lower calorie diet (via better food choices and also portion control or program like weight watchers) and exercise building up to or more than 30 minutes 5 days per week including some aerobic activity  Encouraged to add strength traiing

## 2023-01-30 NOTE — Assessment & Plan Note (Signed)
Overall doing better  Doing PT

## 2023-01-30 NOTE — Assessment & Plan Note (Signed)
Lab Results  Component Value Date   HGBA1C 6.5 01/18/2023   Well controlled Januvia 100 mg daily  Glipizide xl 10 mg daily  Metformin 1000 mg bid   Microalb utd  Diet is improved  Encouraged more exercise as tolerated

## 2023-01-31 DIAGNOSIS — G4733 Obstructive sleep apnea (adult) (pediatric): Secondary | ICD-10-CM | POA: Diagnosis not present

## 2023-02-15 ENCOUNTER — Ambulatory Visit: Payer: Medicare PPO | Admitting: Podiatry

## 2023-02-15 ENCOUNTER — Encounter: Payer: Self-pay | Admitting: Podiatry

## 2023-02-15 DIAGNOSIS — S90852A Superficial foreign body, left foot, initial encounter: Secondary | ICD-10-CM

## 2023-02-15 NOTE — Progress Notes (Signed)
She presents today chief complaint of a painful callus beneath the plantar aspect of the fourth metatarsal area left foot.  States has been sore for the past few days.  Denies any trauma.  Objective: Vital signs stable alert oriented x 3 there is no cellulitis drainage or odor there is mild erythema surrounding what appears to be a porokeratotic lesion with a darkened central core.  Once this was debrided it was determined to be a small splinter type piece of wood that led to a porokeratotic type lesion.  This was debrided in total  Assessment: Foreign body plantar aspect left foot.  Plan: I debrided the lesion today I remove the small foreign body placed Silvadene cream and a Band-Aid.  She will soak Epsom salts and warm water watch this carefully for any signs of infection notify us with any questions or concerns.

## 2023-02-20 ENCOUNTER — Other Ambulatory Visit: Payer: Self-pay | Admitting: Family Medicine

## 2023-02-20 DIAGNOSIS — Z1231 Encounter for screening mammogram for malignant neoplasm of breast: Secondary | ICD-10-CM

## 2023-02-24 ENCOUNTER — Other Ambulatory Visit: Payer: Self-pay | Admitting: Family Medicine

## 2023-03-10 ENCOUNTER — Other Ambulatory Visit: Payer: Self-pay | Admitting: Family Medicine

## 2023-03-13 ENCOUNTER — Telehealth: Payer: Self-pay | Admitting: Family Medicine

## 2023-03-13 DIAGNOSIS — E1169 Type 2 diabetes mellitus with other specified complication: Secondary | ICD-10-CM

## 2023-03-13 DIAGNOSIS — E538 Deficiency of other specified B group vitamins: Secondary | ICD-10-CM

## 2023-03-13 NOTE — Telephone Encounter (Signed)
-----   Message from Alvina Chou sent at 03/03/2023 12:16 PM EDT ----- Regarding: Lab orders for Tuesday, 7.9.24 Lab orders, thanks

## 2023-03-14 ENCOUNTER — Other Ambulatory Visit (INDEPENDENT_AMBULATORY_CARE_PROVIDER_SITE_OTHER): Payer: Medicare PPO

## 2023-03-14 DIAGNOSIS — E1169 Type 2 diabetes mellitus with other specified complication: Secondary | ICD-10-CM

## 2023-03-14 DIAGNOSIS — E538 Deficiency of other specified B group vitamins: Secondary | ICD-10-CM

## 2023-03-14 DIAGNOSIS — E785 Hyperlipidemia, unspecified: Secondary | ICD-10-CM

## 2023-03-14 LAB — LIPID PANEL
Cholesterol: 165 mg/dL (ref 0–200)
HDL: 42.7 mg/dL (ref >39.00)
NonHDL: 121.92
Total CHOL/HDL Ratio: 4
Triglycerides: 289 mg/dL — ABNORMAL HIGH (ref 0.0–149.0)
VLDL: 57.8 mg/dL — ABNORMAL HIGH (ref 0.0–40.0)

## 2023-03-14 LAB — LDL CHOLESTEROL, DIRECT: Direct LDL: 85 mg/dL

## 2023-03-14 LAB — VITAMIN B12: Vitamin B-12: >1500 pg/mL — ABNORMAL HIGH (ref 211–911)

## 2023-03-19 ENCOUNTER — Other Ambulatory Visit: Payer: Self-pay | Admitting: Family Medicine

## 2023-04-03 DIAGNOSIS — E119 Type 2 diabetes mellitus without complications: Secondary | ICD-10-CM | POA: Diagnosis not present

## 2023-04-03 DIAGNOSIS — H5213 Myopia, bilateral: Secondary | ICD-10-CM | POA: Diagnosis not present

## 2023-04-03 DIAGNOSIS — H43812 Vitreous degeneration, left eye: Secondary | ICD-10-CM | POA: Diagnosis not present

## 2023-04-03 DIAGNOSIS — H40013 Open angle with borderline findings, low risk, bilateral: Secondary | ICD-10-CM | POA: Diagnosis not present

## 2023-04-03 DIAGNOSIS — H2513 Age-related nuclear cataract, bilateral: Secondary | ICD-10-CM | POA: Diagnosis not present

## 2023-04-03 LAB — HM DIABETES EYE EXAM

## 2023-04-10 ENCOUNTER — Other Ambulatory Visit: Payer: Self-pay | Admitting: Family Medicine

## 2023-04-16 ENCOUNTER — Other Ambulatory Visit: Payer: Self-pay | Admitting: Family Medicine

## 2023-04-25 ENCOUNTER — Ambulatory Visit (INDEPENDENT_AMBULATORY_CARE_PROVIDER_SITE_OTHER): Payer: Medicare PPO

## 2023-04-25 VITALS — Wt 196.0 lb

## 2023-04-25 DIAGNOSIS — Z0000 Encounter for general adult medical examination without abnormal findings: Secondary | ICD-10-CM

## 2023-04-25 NOTE — Progress Notes (Signed)
Subjective:   Brooke Mitchell is a 72 y.o. female who presents for Medicare Annual (Subsequent) preventive examination.  Visit Complete: Virtual  I connected with  Brooke Mitchell on 04/25/23 by a audio enabled telemedicine application and verified that I am speaking with the correct person using two identifiers.  Patient Location: Home  Provider Location: Office/Clinic  I discussed the limitations of evaluation and management by telemedicine. The patient expressed understanding and agreed to proceed.  Patient Medicare AWV questionnaire was completed by the patient on 04/21/23; I have confirmed that all information answered by patient is correct and no changes since this date.  Vital Signs: Unable to obtain new vitals due to this being a telehealth visit.   Review of Systems     Cardiac Risk Factors include: advanced age (>33men, >50 women);diabetes mellitus;hypertension;dyslipidemia;obesity (BMI >30kg/m2)     Objective:    Today's Vitals   04/25/23 1036  Weight: 196 lb (88.9 kg)   Body mass index is 33.91 kg/m.     04/25/2023   10:41 AM 01/13/2021   10:48 AM 01/13/2020   10:31 AM 09/01/2019    3:58 PM 01/04/2019   10:49 AM 04/10/2018    9:05 AM 12/29/2017    9:18 AM  Advanced Directives  Does Patient Have a Medical Advance Directive? Yes Yes Yes Yes Yes Yes Yes  Type of Estate agent of [place];Living will Healthcare Power of [employer];Living will Healthcare Power of [place];Living will Healthcare Power of [place];Living will Healthcare Power of [place];Living will Healthcare Power of [place];Living will Living will;Healthcare Power of Attorney  Does patient want to make changes to medical advance directive? No - Patient declined    No - Patient declined No - Patient declined   Copy of Healthcare Power of Attorney in Chart? Yes - validated most recent copy scanned in chart (See row information) Yes - validated most recent copy scanned in chart (See  row information) No - copy requested  No - copy requested No - copy requested No - copy requested    Current Medications (verified) Outpatient Encounter Medications as of 04/25/2023  Medication Sig   acetaminophen (TYLENOL) 325 MG tablet Take 650 mg by mouth as needed for pain.   albuterol (PROAIR HFA) 108 (90 Base) MCG/ACT inhaler INHALE TWO PUFFS BY MOUTH UP TO EVERY 4 HOURS AS NEEDED FOR WHEEZING   Ascorbic Acid (VITAMIN C) 100 MG tablet Take 100 mg by mouth daily.   atorvastatin (LIPITOR) 40 MG tablet Take 1 tablet (40 mg total) by mouth daily.   calcium carbonate (OS-CAL) 600 MG TABS tablet Take 600 mg by mouth 2 (two) times daily with a meal.   Cholecalciferol 4000 UNITS CAPS Take 4,000 Units by mouth daily. Pt takes 2 tablets of 2,000 units per day = 4,000 units a day   Cyanocobalamin (VITAMIN B-12 PO) Take 500 mcg by mouth daily.   fluticasone-salmeterol (ADVAIR) 100-50 MCG/ACT AEPB Inhale 1 puff into the lungs 2 (two) times daily as needed.   glipiZIDE (GLUCOTROL XL) 10 MG 24 hr tablet TAKE 1 TABLET BY MOUTH DAILY   glucose blood (ACCU-CHEK GUIDE) test strip USE TO CHECK BLOOD SUGAR ONCE DAILY. Dx E11.9   hyoscyamine (LEVSIN SL) 0.125 MG SL tablet Place one tablet under tongue once a day as needed.   IRON, FERROUS GLUCONATE, PO Take 1 tablet by mouth 2 (two) times daily.    JANUVIA 100 MG tablet TAKE 1 TABLET BY MOUTH DAILY   levothyroxine (SYNTHROID) 75  MCG tablet TAKE 1 TABLET BY MOUTH DAILY BEFORE BREAKFAST   lisinopril-hydrochlorothiazide (ZESTORETIC) 10-12.5 MG tablet TAKE 1 TABLET BY MOUTH DAILY   loratadine (CLARITIN) 10 MG tablet Take 10 mg by mouth daily.   metFORMIN (GLUCOPHAGE) 1000 MG tablet TAKE 1 TABLET BY MOUTH TWICE A DAY WITH A MEAL   methocarbamol (ROBAXIN) 500 MG tablet Take 1 tablet (500 mg total) by mouth every 6 (six) hours as needed for muscle spasms.   multivitamin (THERAGRAN) per tablet Take 1 tablet by mouth daily.   No facility-administered encounter  medications on file as of 04/25/2023.    Allergies (verified) Patient has no known allergies.   History: Past Medical History:  Diagnosis Date   Allergic rhinitis    Allergy    Anxiety    Arthritis    osteoarthritis   Asthma    As a child    Cataract Mother--Donna   Depression    Diabetes mellitus    Type II (2/06 elevated microalbumin)   Frequent UTI    with coital prohylaxis    GERD (gastroesophageal reflux disease)    Glaucoma Mother--Donna   Hyperlipidemia    Hypertension    Hypothyroidism    IDA (iron deficiency anemia)    Obesity    OSA (obstructive sleep apnea)    CPAP   Sleep apnea    wears c-pap   Past Surgical History:  Procedure Laterality Date   BELPHAROPTOSIS REPAIR     COLONOSCOPY     DILATION AND CURETTAGE OF UTERUS     ENDOMETRIAL BIOPSY     FOOT SURGERY     UPPER GASTROINTESTINAL ENDOSCOPY     Family History  Problem Relation Age of Onset   Diabetes Mother    Coronary artery disease Mother    Hypothyroidism Mother    Irritable bowel syndrome Mother    Hyperlipidemia Mother    Hypertension Mother    Obesity Mother    Vision loss Mother    Alzheimer's disease Father    Nephrolithiasis Father    Arthritis Father    Varicose Veins Father    Hypothyroidism Brother    Breast cancer Paternal Grandmother 47   Colon cancer Neg Hx    Esophageal cancer Neg Hx    Rectal cancer Neg Hx    Stomach cancer Neg Hx    Social History   Socioeconomic History   Marital status: Widowed    Spouse name: Not on file   Number of children: 2   Years of education: Not on file   Highest education level: Master's degree (e.g., MA, MS, MEng, MEd, MSW, MBA)  Occupational History   Occupation: Magazine features editor: [employer]    Employer: RETIRED  Tobacco Use   Smoking status: Never   Smokeless tobacco: Never  Vaping Use   Vaping status: Never Used  Substance and Sexual Activity   Alcohol use: Not Currently    Comment: Occasional glass of  wine or mixed drink   Drug use: Never   Sexual activity: Not Currently    Birth control/protection: Post-menopausal  Other Topics Concern   Not on file  Social History Narrative   Some walking for exercise   Widow 11/2013- Cared for husband full time with ALS at home    Social Determinants of Health   Financial Resource Strain: Low Risk  (04/21/2023)   Overall Financial Resource Strain (CARDIA)    Difficulty of Paying Living Expenses: Not very hard  Food Insecurity: No  Food Insecurity (04/21/2023)   Hunger Vital Sign    Worried About Running Out of Food in the Last Year: Never true    Ran Out of Food in the Last Year: Never true  Transportation Needs: No Transportation Needs (04/21/2023)   PRAPARE - Administrator, Civil Service (Medical): No    Lack of Transportation (Non-Medical): No  Physical Activity: Insufficiently Active (04/21/2023)   Exercise Vital Sign    Days of Exercise per Week: 2 days    Minutes of Exercise per Session: 20 min  Stress: No Stress Concern Present (04/21/2023)   [employer] of Occupational Health - Occupational Stress Questionnaire    Feeling of Stress : Only a little  Social Connections: Moderately Integrated (04/21/2023)   Social Connection and Isolation Panel [NHANES]    Frequency of Communication with Friends and Family: More than three times a week    Frequency of Social Gatherings with Friends and Family: More than three times a week    Attends Religious Services: More than 4 times per year    Active Member of [employer] or Organizations: Yes    Attends Banker Meetings: More than 4 times per year    Marital Status: Widowed    Tobacco Counseling Counseling given: Not Answered   Clinical Intake:  Pre-visit preparation completed: Yes  Pain : No/denies pain     BMI - recorded: 33.91 Nutritional Status: BMI > 30  Obese Nutritional Risks: None Diabetes: Yes CBG done?: Yes (per pt 140) CBG resulted in Enter/ Edit  results?: No Did pt. bring in CBG monitor from home?: No  How often do you need to have someone help you when you read instructions, pamphlets, or other written materials from your doctor or pharmacy?: 2 - Rarely  Interpreter Needed?: No  Information entered by :: Lanier Ensign, LPN   Activities of Daily Living    04/21/2023    2:08 PM  In your present state of health, do you have any difficulty performing the following activities:  Hearing? 0  Vision? 0  Difficulty concentrating or making decisions? 0  Walking or climbing stairs? 1  Dressing or bathing? 0  Doing errands, shopping? 0  Preparing Food and eating ? N  Using the Toilet? N  In the past six months, have you accidently leaked urine? N  Do you have problems with loss of bowel control? N  Managing your Medications? N  Managing your Finances? N  Housekeeping or managing your Housekeeping? N    Patient Care Team: [place], Audrie Gallus, MD as PCP - General Clance, Maree Krabbe, MD as Attending Physician (Pulmonary Disease)  Indicate any recent Medical Services you may have received from other than [facility] providers in the past year (date may be approximate).     Assessment:   This is a routine wellness examination for [place].  Hearing/Vision screen Hearing Screening - Comments:: Pt denies any hearing issues  Vision Screening - Comments:: Pt follows up with [facility] in [place] for annual eye exams   Dietary issues and exercise activities discussed:     Goals Addressed             This Visit's Progress    Patient Stated       Continue to stay healthy and active        Depression Screen    04/25/2023   10:39 AM 01/27/2023   10:24 AM 10/26/2022    2:34 PM 01/24/2022  2:22 PM 01/13/2021   10:49 AM 01/13/2020   10:33 AM 01/04/2019   10:48 AM  PHQ 2/9 Scores  PHQ - 2 Score 0 0 0 0 0 0 0  PHQ- 9 Score  0 1  0 0 0    Fall Risk    04/21/2023    2:08 PM 01/27/2023   10:24 AM 10/26/2022    2:33 PM 01/13/2021    10:49 AM 01/13/2020   10:32 AM  Fall Risk   Falls in the past year? 0 1 1 0 1  Comment     tripped on sidewalk  Number falls in past yr: 0 0 0 0 0  Injury with Fall? 0 1 1 0 0  Risk for fall due to : Impaired vision History of fall(s) History of fall(s) Medication side effect Medication side effect  Follow up Falls prevention discussed Falls evaluation completed Falls evaluation completed Falls evaluation completed;Falls prevention discussed Falls evaluation completed;Falls prevention discussed    MEDICARE RISK AT HOME: Medicare Risk at Home Any stairs in or around the home?: No If so, are there any without handrails?: No Home free of loose throw rugs in walkways, pet beds, electrical cords, etc?: Yes Adequate lighting in your home to reduce risk of falls?: Yes Life alert?: No Use of a cane, walker or w/c?: No Grab bars in the bathroom?: Yes Shower chair or bench in shower?: No Elevated toilet seat or a handicapped toilet?: Yes  TIMED UP AND GO:  Was the test performed?  No    Cognitive Function:    01/13/2021   10:50 AM 01/13/2020   10:36 AM 01/04/2019    2:40 PM 12/29/2017    9:18 AM  MMSE - Mini Mental State Exam  Not completed: Unable to complete     Orientation to time  5 5 5   Orientation to Place  5 5 5   Registration  3 3 3   Attention/ Calculation  5 0 0  Recall  3 3 2   Recall-comments    unable to recall 1 of 3 words  Language- name 2 objects   0 0  Language- repeat  1 1 1   Language- follow 3 step command   0 3  Language- read & follow direction   0 0  Write a sentence   0 0  Copy design   0 0  Total score   17 19        04/25/2023   10:41 AM  6CIT Screen  What Year? 0 points  What month? 0 points  What time? 0 points  Count back from 20 0 points  Months in reverse 0 points  Repeat phrase 0 points  Total Score 0 points    Immunizations Immunization History  Administered Date(s) Administered   COVID-19, mRNA, vaccine(Comirnaty)12 years and older  06/03/2022   Fluad Quad(high Dose 65+) 05/08/2019, 05/30/2020   H1N1 09/22/2008   Influenza Split 05/31/2012   Influenza Whole 06/05/2006, 06/29/2007, 07/09/2009, 08/04/2010   Influenza, High Dose Seasonal PF 06/10/2021, 05/02/2022   Influenza,inj,Quad PF,6+ Mos 06/19/2013, 06/25/2014, 06/16/2015, 06/14/2016, 06/14/2017, 05/25/2018   PFIZER(Purple Top)SARS-COV-2 Vaccination 09/25/2019, 10/16/2019, 06/17/2020   Pfizer Covid-19 Vaccine Bivalent Booster 14yrs & up 06/10/2021   Pneumococcal Conjugate-13 12/23/2016   Pneumococcal Polysaccharide-23 04/03/2009, 12/29/2017   Respiratory Syncytial Virus Vaccine,Recomb Aduvanted(Arexvy) 05/02/2022   Td 01/04/2004, 03/31/2010   Tdap 08/23/2016   Zoster Recombinant(Shingrix) 07/28/2017, 03/24/2018   Zoster, Live 04/29/2011    TDAP status: Up to date  Flu  Vaccine status: Due, Education has been provided regarding the importance of this vaccine. Advised may receive this vaccine at local pharmacy or Health Dept. Aware to provide a copy of the vaccination record if obtained from local pharmacy or Health Dept. Verbalized acceptance and understanding.  Pneumococcal vaccine status: Up to date  Covid-19 vaccine status: Information provided on how to obtain vaccines.   Qualifies for Shingles Vaccine? Yes   Zostavax completed Yes   Shingrix Completed?: Yes  Screening Tests Health Maintenance  Topic Date Due   OPHTHALMOLOGY EXAM  04/15/2021   COVID-19 Vaccine (6 - 2023-24 season) 07/29/2022   INFLUENZA VACCINE  04/06/2023   HEMOGLOBIN A1C  07/21/2023   FOOT EXAM  07/26/2023   Diabetic kidney evaluation - eGFR measurement  01/18/2024   Diabetic kidney evaluation - Urine ACR  01/18/2024   Medicare Annual Wellness (AWV)  04/24/2024   MAMMOGRAM  08/19/2024   Colonoscopy  01/18/2025   DTaP/Tdap/Td (4 - Td or Tdap) 08/23/2026   Pneumonia Vaccine 63+ Years old  Completed   DEXA SCAN  Completed   Hepatitis C Screening  Completed   Zoster Vaccines-  Shingrix  Completed   HPV VACCINES  Aged Out    Health Maintenance  Health Maintenance Due  Topic Date Due   OPHTHALMOLOGY EXAM  04/15/2021   COVID-19 Vaccine (6 - 2023-24 season) 07/29/2022   INFLUENZA VACCINE  04/06/2023    Colorectal cancer screening: Type of screening: Colonoscopy. Completed 01/19/15. Repeat every 10 years  Mammogram status: Completed 08/22/23. Repeat every year  Bone Density status: Completed 08/22/23. Results reflect: Bone density results: NORMAL. Repeat every 2 years.   Additional Screening:  Hepatitis C Screening:  Completed 12/29/17  Vision Screening: Recommended annual ophthalmology exams for early detection of glaucoma and other disorders of the eye. Is the patient up to date with their annual eye exam?  Yes  Who is the provider or what is the name of the office in which the patient attends annual eye exams? [facility] requested  If pt is not established with a provider, would they like to be referred to a provider to establish care? No .   Dental Screening: Recommended annual dental exams for proper oral hygiene  Diabetic Foot Exam: Diabetic Foot Exam: Completed 07/25/22  Community Resource Referral / Chronic Care Management: CRR required this visit?  No   CCM required this visit?  No     Plan:     I have personally reviewed and noted the following in the patient's chart:   Medical and social history Use of alcohol, tobacco or illicit drugs  Current medications and supplements including opioid prescriptions. Patient is not currently taking opioid prescriptions. Functional ability and status Nutritional status Physical activity Advanced directives List of other physicians Hospitalizations, surgeries, and ER visits in previous 12 months Vitals Screenings to include cognitive, depression, and falls Referrals and appointments  In addition, I have reviewed and discussed with patient certain preventive protocols, quality metrics,  and best practice recommendations. A written personalized care plan for preventive services as well as general preventive health recommendations were provided to patient.     Marzella Schlein, LPN   [id number]   After Visit Summary: (MyChart) Due to this being a telephonic visit, the after visit summary with patients personalized plan was offered to patient via MyChart   Nurse Notes: none

## 2023-04-25 NOTE — Patient Instructions (Signed)
[name] , Thank you for taking time to come for your Medicare Wellness Visit. I appreciate your ongoing commitment to your health goals. Please review the following plan we discussed and let me know if I can assist you in the future.   Referrals/Orders/Follow-Ups/Clinician Recommendations: stay healthy and active   This is a list of the screening recommended for you and due dates:  Health Maintenance  Topic Date Due   Eye exam for diabetics  04/15/2021   COVID-19 Vaccine (6 - 2023-24 season) 07/29/2022   Flu Shot  04/06/2023   Hemoglobin A1C  07/21/2023   Complete foot exam   07/26/2023   Yearly kidney function blood test for diabetes  01/18/2024   Yearly kidney health urinalysis for diabetes  01/18/2024   Medicare Annual Wellness Visit  04/24/2024   Mammogram  08/19/2024   Colon Cancer Screening  01/18/2025   DTaP/Tdap/Td vaccine (4 - Td or Tdap) 08/23/2026   Pneumonia Vaccine  Completed   DEXA scan (bone density measurement)  Completed   Hepatitis C Screening  Completed   Zoster (Shingles) Vaccine  Completed   HPV Vaccine  Aged Out    Advanced directives: (In Chart) A copy of your advanced directives are scanned into your chart should your provider ever need it.  Next Medicare Annual Wellness Visit scheduled for next year: Yes  Preventive Care 72 Years and Older, Female Preventive care refers to lifestyle choices and visits with your health care provider that can promote health and wellness. What does preventive care include? A yearly physical exam. This is also called an annual well check. Dental exams once or twice a year. Routine eye exams. Ask your health care provider how often you should have your eyes checked. Personal lifestyle choices, including: Daily care of your teeth and gums. Regular physical activity. Eating a healthy diet. Avoiding tobacco and drug use. Limiting alcohol use. Practicing safe sex. Taking low-dose aspirin every day. Taking vitamin and  mineral supplements as recommended by your health care provider. What happens during an annual well check? The services and screenings done by your health care provider during your annual well check will depend on your age, overall health, lifestyle risk factors, and family history of disease. Counseling  Your health care provider may ask you questions about your: Alcohol use. Tobacco use. Drug use. Emotional well-being. Home and relationship well-being. Sexual activity. Eating habits. History of falls. Memory and ability to understand (cognition). Work and work Astronomer. Reproductive health. Screening  You may have the following tests or measurements: Height, weight, and BMI. Blood pressure. Lipid and cholesterol levels. These may be checked every 5 years, or more frequently if you are over 36 years old. Skin check. Lung cancer screening. You may have this screening every year starting at age 38 if you have a 30-pack-year history of smoking and currently smoke or have quit within the past 15 years. Fecal occult blood test (FOBT) of the stool. You may have this test every year starting at age 1. Flexible sigmoidoscopy or colonoscopy. You may have a sigmoidoscopy every 5 years or a colonoscopy every 10 years starting at age 50. Hepatitis C blood test. Hepatitis B blood test. Sexually transmitted disease (STD) testing. Diabetes screening. This is done by checking your blood sugar (glucose) after you have not eaten for a while (fasting). You may have this done every 1-3 years. Bone density scan. This is done to screen for osteoporosis. You may have this done starting at age 28. Mammogram.  This may be done every 1-2 years. Talk to your health care provider about how often you should have regular mammograms. Talk with your health care provider about your test results, treatment options, and if necessary, the need for more tests. Vaccines  Your health care provider may recommend  certain vaccines, such as: Influenza vaccine. This is recommended every year. Tetanus, diphtheria, and acellular pertussis (Tdap, Td) vaccine. You may need a Td booster every 10 years. Zoster vaccine. You may need this after age 87. Pneumococcal 13-valent conjugate (PCV13) vaccine. One dose is recommended after age 49. Pneumococcal polysaccharide (PPSV23) vaccine. One dose is recommended after age 32. Talk to your health care provider about which screenings and vaccines you need and how often you need them. This information is not intended to replace advice given to you by your health care provider. Make sure you discuss any questions you have with your health care provider. Document Released: 09/18/2015 Document Revised: 05/11/2016 Document Reviewed: 06/23/2015 Elsevier Interactive Patient Education  2017 [employer].  Fall Prevention in the Home Falls can cause injuries. They can happen to people of all ages. There are many things you can do to make your home safe and to help prevent falls. What can I do on the outside of my home? Regularly fix the edges of walkways and driveways and fix any cracks. Remove anything that might make you trip as you walk through a door, such as a raised step or threshold. Trim any bushes or trees on the path to your home. Use bright outdoor lighting. Clear any walking paths of anything that might make someone trip, such as rocks or tools. Regularly check to see if handrails are loose or broken. Make sure that both sides of any steps have handrails. Any raised decks and porches should have guardrails on the edges. Have any leaves, snow, or ice cleared regularly. Use sand or salt on walking paths during winter. Clean up any spills in your garage right away. This includes oil or grease spills. What can I do in the bathroom? Use night lights. Install grab bars by the toilet and in the tub and shower. Do not use towel bars as grab bars. Use non-skid mats or  decals in the tub or shower. If you need to sit down in the shower, use a plastic, non-slip stool. Keep the floor dry. Clean up any water that spills on the floor as soon as it happens. Remove soap buildup in the tub or shower regularly. Attach bath mats securely with double-sided non-slip rug tape. Do not have throw rugs and other things on the floor that can make you trip. What can I do in the bedroom? Use night lights. Make sure that you have a light by your bed that is easy to reach. Do not use any sheets or blankets that are too big for your bed. They should not hang down onto the floor. Have a firm chair that has side arms. You can use this for support while you get dressed. Do not have throw rugs and other things on the floor that can make you trip. What can I do in the kitchen? Clean up any spills right away. Avoid walking on wet floors. Keep items that you use a lot in easy-to-reach places. If you need to reach something above you, use a strong step stool that has a grab bar. Keep electrical cords out of the way. Do not use floor polish or wax that makes floors slippery. If you  must use wax, use non-skid floor wax. Do not have throw rugs and other things on the floor that can make you trip. What can I do with my stairs? Do not leave any items on the stairs. Make sure that there are handrails on both sides of the stairs and use them. Fix handrails that are broken or loose. Make sure that handrails are as long as the stairways. Check any carpeting to make sure that it is firmly attached to the stairs. Fix any carpet that is loose or worn. Avoid having throw rugs at the top or bottom of the stairs. If you do have throw rugs, attach them to the floor with carpet tape. Make sure that you have a light switch at the top of the stairs and the bottom of the stairs. If you do not have them, ask someone to add them for you. What else can I do to help prevent falls? Wear shoes that: Do not  have high heels. Have rubber bottoms. Are comfortable and fit you well. Are closed at the toe. Do not wear sandals. If you use a stepladder: Make sure that it is fully opened. Do not climb a closed stepladder. Make sure that both sides of the stepladder are locked into place. Ask someone to hold it for you, if possible. Clearly mark and make sure that you can see: Any grab bars or handrails. First and last steps. Where the edge of each step is. Use tools that help you move around (mobility aids) if they are needed. These include: Canes. Walkers. Scooters. Crutches. Turn on the lights when you go into a dark area. Replace any light bulbs as soon as they burn out. Set up your furniture so you have a clear path. Avoid moving your furniture around. If any of your floors are uneven, fix them. If there are any pets around you, be aware of where they are. Review your medicines with your doctor. Some medicines can make you feel dizzy. This can increase your chance of falling. Ask your doctor what other things that you can do to help prevent falls. This information is not intended to replace advice given to you by your health care provider. Make sure you discuss any questions you have with your health care provider. Document Released: 06/18/2009 Document Revised: 01/28/2016 Document Reviewed: 09/26/2014 Elsevier Interactive Patient Education  2017 [employer].

## 2023-05-30 DIAGNOSIS — G4733 Obstructive sleep apnea (adult) (pediatric): Secondary | ICD-10-CM | POA: Diagnosis not present

## 2023-05-31 ENCOUNTER — Ambulatory Visit: Payer: Medicare PPO | Admitting: Dermatology

## 2023-05-31 ENCOUNTER — Encounter: Payer: Self-pay | Admitting: Dermatology

## 2023-05-31 DIAGNOSIS — L578 Other skin changes due to chronic exposure to nonionizing radiation: Secondary | ICD-10-CM | POA: Diagnosis not present

## 2023-05-31 DIAGNOSIS — L814 Other melanin hyperpigmentation: Secondary | ICD-10-CM

## 2023-05-31 DIAGNOSIS — L821 Other seborrheic keratosis: Secondary | ICD-10-CM | POA: Diagnosis not present

## 2023-05-31 DIAGNOSIS — Z85828 Personal history of other malignant neoplasm of skin: Secondary | ICD-10-CM

## 2023-05-31 DIAGNOSIS — Z1283 Encounter for screening for malignant neoplasm of skin: Secondary | ICD-10-CM

## 2023-05-31 DIAGNOSIS — D229 Melanocytic nevi, unspecified: Secondary | ICD-10-CM

## 2023-05-31 DIAGNOSIS — W908XXA Exposure to other nonionizing radiation, initial encounter: Secondary | ICD-10-CM

## 2023-05-31 DIAGNOSIS — D1801 Hemangioma of skin and subcutaneous tissue: Secondary | ICD-10-CM

## 2023-05-31 DIAGNOSIS — D239 Other benign neoplasm of skin, unspecified: Secondary | ICD-10-CM | POA: Diagnosis not present

## 2023-05-31 NOTE — Patient Instructions (Signed)
Important Information  Due to recent changes in healthcare laws, you may see results of your pathology and/or laboratory studies on MyChart before the doctors have had a chance to review them. We understand that in some cases there may be results that are confusing or concerning to you. Please understand that not all results are received at the same time and often the doctors may need to interpret multiple results in order to provide you with the best plan of care or course of treatment. Therefore, we ask that you please give [place] 2 business days to thoroughly review all your results before contacting the office for clarification. Should we see a critical lab result, you will be contacted sooner.   If You Need Anything After Your Visit  If you have any questions or concerns for your doctor, please call our main line at [phone] If no one answers, please leave a voicemail as directed and we will return your call as soon as possible. Messages left after 4 pm will be answered the following business day.   You may also send [place] a message via MyChart. We typically respond to MyChart messages within 1-2 business days.  For prescription refills, please ask your pharmacy to contact our office. Our fax number is [phone].  If you have an urgent issue when the clinic is closed that cannot wait until the next business day, you can page your doctor at the number below.    Please note that while we do our best to be available for urgent issues outside of office hours, we are not available 24/7.   If you have an urgent issue and are unable to reach [place], you may choose to seek medical care at your doctor's office, retail clinic, urgent care center, or emergency room.  If you have a medical emergency, please immediately call 911 or go to the emergency department. In the event of inclement weather, please call our main line at [phone] for an update on the status of any delays or  closures.  Dermatology Medication Tips: Please keep the boxes that topical medications come in in order to help keep track of the instructions about where and how to use these. Pharmacies typically print the medication instructions only on the boxes and not directly on the medication tubes.   If your medication is too expensive, please contact our office at [phone] or send [place] a message through MyChart.   We are unable to tell what your co-pay for medications will be in advance as this is different depending on your insurance coverage. However, we may be able to find a substitute medication at lower cost or fill out paperwork to get insurance to cover a needed medication.   If a prior authorization is required to get your medication covered by your insurance company, please allow [place] 1-2 business days to complete this process.  Drug prices often vary depending on where the prescription is filled and some pharmacies may offer cheaper prices.  The website www.goodrx.com contains coupons for medications through different pharmacies. The prices here do not account for what the cost may be with help from insurance (it may be cheaper with your insurance), but the website can give you the price if you did not use any insurance.  - You can print the associated coupon and take it with your prescription to the pharmacy.  - You may also stop by our office during regular business hours and pick up a GoodRx coupon card.  - If  you need your prescription sent electronically to a different pharmacy, notify our office through [facility] or by phone at [phone]

## 2023-05-31 NOTE — Progress Notes (Signed)
   New Patient Visit   Subjective  Brooke Mitchell is a 72 y.o. female who presents for the following: Skin Cancer Screening and Full Body Skin Exam - she thinks she may have had some precancers treated with LN2 in the past and she may have had some skin cancers removed.  The patient presents for Total-Body Skin Exam (TBSE) for skin cancer screening and mole check. The patient has spots, moles and lesions to be evaluated, some may be new or changing.  The following portions of the chart were reviewed this encounter and updated as appropriate: medications, allergies, medical history  Review of Systems:  No other skin or systemic complaints except as noted in HPI or Assessment and Plan.  Objective  Well appearing patient in no apparent distress; mood and affect are within normal limits.  A full examination was performed including scalp, head, eyes, ears, nose, lips, neck, chest, axillae, abdomen, back, buttocks, bilateral upper extremities, bilateral lower extremities, hands, feet, fingers, toes, fingernails, and toenails. All findings within normal limits unless otherwise noted below.   Relevant physical exam findings are noted in the Assessment and Plan.   Assessment & Plan   SKIN CANCER SCREENING PERFORMED TODAY.  ACTINIC DAMAGE - Chronic condition, secondary to cumulative UV/sun exposure - diffuse scaly erythematous macules with underlying dyspigmentation - Recommend daily broad spectrum sunscreen SPF 30+ to sun-exposed areas, reapply every 2 hours as needed.  - Staying in the shade or wearing long sleeves, sun glasses (UVA+UVB protection) and wide brim hats (4-inch brim around the entire circumference of the hat) are also recommended for sun protection.  - Call for new or changing lesions.  LENTIGINES, SEBORRHEIC KERATOSES, HEMANGIOMAS - Benign normal skin lesions - Benign-appearing - Call for any changes  MELANOCYTIC NEVI - Tan-brown and/or pink-flesh-colored symmetric  macules and papules - Benign appearing on exam today - Observation - Call clinic for new or changing moles - Recommend daily use of broad spectrum spf 30+ sunscreen to sun-exposed areas.   DERMATOFIBROMA Exam: Firm pink/brown papulenodule with dimple sign of legs Treatment Plan: A dermatofibroma is a benign growth possibly related to trauma, such as an insect bite, cut from shaving, or inflamed acne-type bump.  Treatment options to remove include shave or excision with resulting scar and risk of recurrence.  Since benign-appearing and not bothersome, will observe for now.   HISTORY OF NON MELANOMA SKIN CANCER - Clear. Observe for recurrence.  - Call clinic for new or changing lesions.   - Recommend regular skin exams, daily broad-spectrum spf 30+ sunscreen use, and photoprotection.      Return in about 1 year (around 05/30/2024) for TBSE.  I, Joanie Coddington, CMA, am acting as scribe for Gwenith Daily, MD .   Documentation: I have reviewed the above documentation for accuracy and completeness, and I agree with the above.  Gwenith Daily, MD

## 2023-06-17 ENCOUNTER — Other Ambulatory Visit: Payer: Self-pay | Admitting: Family Medicine

## 2023-07-10 ENCOUNTER — Telehealth: Payer: Self-pay | Admitting: Family Medicine

## 2023-07-10 NOTE — Telephone Encounter (Signed)
Pt called stating her mask to her c pap machine is broken. Pt asked was there anywhere local she can go buy a mask? Pt stated she wanted to go somewhere nearby to get a mask instead of calling the company. Call back # [phone]

## 2023-07-10 NOTE — Telephone Encounter (Signed)
Yes- she needs to call the company or Dr Reginia Naas office (the treating pulmonologist)   Thanks for letting me know   Will cc Dr Vassie Loll

## 2023-07-10 NOTE — Telephone Encounter (Signed)
Left VM requesting pt to call the office back 

## 2023-07-10 NOTE — Telephone Encounter (Signed)
From what I have been told you need a order/Rx to purchase a Cpap mask, it's not a OTC supply, will route to PCP for review

## 2023-07-10 NOTE — Telephone Encounter (Signed)
Sounds like [facility] may be able to supply a mask or a medical supply co or call the company who originally supplied it  Can also re est with pulmonary if needed  I can write a prescription -what kind of mask does she have? - full face or just nose ?  Does she also need tubing ?   [place]

## 2023-07-11 NOTE — Telephone Encounter (Signed)
Left message to return call to our office. Sending my chart message with information as well.

## 2023-07-13 ENCOUNTER — Other Ambulatory Visit: Payer: Self-pay | Admitting: Family Medicine

## 2023-08-01 ENCOUNTER — Encounter: Payer: Self-pay | Admitting: Family Medicine

## 2023-08-01 ENCOUNTER — Ambulatory Visit: Payer: Medicare PPO | Admitting: Family Medicine

## 2023-08-01 VITALS — BP 112/66 | HR 85 | Temp 98.4°F | Ht 63.75 in | Wt 198.0 lb

## 2023-08-01 DIAGNOSIS — M545 Low back pain, unspecified: Secondary | ICD-10-CM | POA: Diagnosis not present

## 2023-08-01 DIAGNOSIS — E039 Hypothyroidism, unspecified: Secondary | ICD-10-CM | POA: Diagnosis not present

## 2023-08-01 DIAGNOSIS — E785 Hyperlipidemia, unspecified: Secondary | ICD-10-CM

## 2023-08-01 DIAGNOSIS — I152 Hypertension secondary to endocrine disorders: Secondary | ICD-10-CM

## 2023-08-01 DIAGNOSIS — E1159 Type 2 diabetes mellitus with other circulatory complications: Secondary | ICD-10-CM | POA: Diagnosis not present

## 2023-08-01 DIAGNOSIS — G8929 Other chronic pain: Secondary | ICD-10-CM | POA: Diagnosis not present

## 2023-08-01 DIAGNOSIS — E1169 Type 2 diabetes mellitus with other specified complication: Secondary | ICD-10-CM

## 2023-08-01 DIAGNOSIS — Z7984 Long term (current) use of oral hypoglycemic drugs: Secondary | ICD-10-CM | POA: Diagnosis not present

## 2023-08-01 DIAGNOSIS — E119 Type 2 diabetes mellitus without complications: Secondary | ICD-10-CM

## 2023-08-01 LAB — LIPID PANEL
Cholesterol: 167 mg/dL (ref 0–200)
HDL: 44.7 mg/dL (ref >39.00)
LDL Cholesterol: 70 mg/dL (ref 0–99)
NonHDL: 122.65
Total CHOL/HDL Ratio: 4
Triglycerides: 261 mg/dL — ABNORMAL HIGH (ref 0.0–149.0)
VLDL: 52.2 mg/dL — ABNORMAL HIGH (ref 0.0–40.0)

## 2023-08-01 LAB — COMPREHENSIVE METABOLIC PANEL
ALT: 13 U/L (ref 0–35)
AST: 14 U/L (ref 0–37)
Albumin: 4.2 g/dL (ref 3.5–5.2)
Alkaline Phosphatase: 49 U/L (ref 39–117)
BUN: 17 mg/dL (ref 6–23)
CO2: 27 meq/L (ref 19–32)
Calcium: 9.4 mg/dL (ref 8.4–10.5)
Chloride: 101 meq/L (ref 96–112)
Creatinine, Ser: 0.98 mg/dL (ref 0.40–1.20)
GFR: 57.67 mL/min — ABNORMAL LOW (ref >60.00)
Glucose, Bld: 141 mg/dL — ABNORMAL HIGH (ref 70–99)
Potassium: 4.2 meq/L (ref 3.5–5.1)
Sodium: 140 meq/L (ref 135–145)
Total Bilirubin: 0.6 mg/dL (ref 0.2–1.2)
Total Protein: 6.5 g/dL (ref 6.0–8.3)

## 2023-08-01 LAB — POCT GLYCOSYLATED HEMOGLOBIN (HGB A1C): Hemoglobin A1C: 6.5 % — AB (ref 4.0–5.6)

## 2023-08-01 NOTE — Assessment & Plan Note (Signed)
bp in fair control at this time  BP Readings from Last 1 Encounters:  08/01/23 112/66   No changes needed Most recent labs reviewed  Disc lifstyle change with low sodium diet and exercise  Plan to continue lisinopril hct 10-12.5 mg daily   Lab today for cmet

## 2023-08-01 NOTE — Patient Instructions (Addendum)
Try to get most of your carbohydrates from produce (with the exception of white potatoes) and whole grains Eat less bread/pasta/rice/snack foods/cereals/sweets and other items from the middle of the grocery store (processed carbs)  For cholesterol Avoid red meat/ fried foods/ egg yolks/ fatty breakfast meats/ butter, cheese and high fat dairy/ and   Labs today for cholesterol and chemistries    Try [place] sausage instead of pork sausage  Eggs are ok in moderation    Keep walking Add some strength training to your routine, this is important for bone and brain health and can reduce your risk of falls and help your body use insulin properly and regulate weight  Light weights, exercise bands , and internet videos are a good way to start  Yoga (chair or regular), machines , floor exercises or a gym with machines are also good options    You may be a good candidate to change from glipizide to a GLP-1 medicine like ozempic or mounjaro  They would help with weight loss and diabetes  Call us if you want to try    Look at the handout with back strain rehab  Try the stretches/exercises  Continue heat  Step up the exercise   If it does not improve or worsens come on back

## 2023-08-01 NOTE — Progress Notes (Signed)
Subjective:    Patient ID: Brooke Mitchell, female    DOB: February 16, 1951, 72 y.o.   MRN: [id number]  HPI  Wt Readings from Last 3 Encounters:  08/01/23 198 lb (89.8 kg)  04/25/23 196 lb (88.9 kg)  01/27/23 196 lb (88.9 kg)   34.25 kg/m  Vitals:   08/01/23 0839  BP: 112/66  Pulse: 85  Temp: 98.4 F (36.9 C)  SpO2: 98%   Pt presents for 6 month follow up of DM2 and HTN and other chronic health problems Also middle back pain - does not shoot down either leg Used some heat  No trauma  Hurts to bend backward / forward bend feels better    Feeling pretty good   HTN bp is stable today  No cp or palpitations or headaches or edema  No side effects to medicines  BP Readings from Last 3 Encounters:  08/01/23 112/66  01/27/23 118/74  11/02/22 120/70    Lisinopril hct 10-12.5 mg daily   Lab Results  Component Value Date   NA 139 01/18/2023   K 4.1 01/18/2023   CO2 29 01/18/2023   GLUCOSE 137 (H) 01/18/2023   BUN 17 01/18/2023   CREATININE 1.14 01/18/2023   CALCIUM 9.3 01/18/2023   GFR 48.28 (L) 01/18/2023   GFRNONAA 84.03 03/16/2010    Dm2 Lab Results  Component Value Date   HGBA1C 6.5 (A) 08/01/2023   Same today- 6.5  Sent for recent eye exam report   Januvia 100 mg daily  Glipizide xl 10 mg daily  Metformin 1000 mg bid   Lab Results  Component Value Date   MICROALBUR 2.5 (H) 01/18/2023   MICROALBUR 0.9 01/17/2022   Diet has been fair  Does not over indulge  Avoids sweets   Cautious with carbs  Bread and rice   Eats fruit daily  Needs more veggies   Hyperlipidemia Lab Results  Component Value Date   CHOL 165 03/14/2023   HDL 42.70 03/14/2023   LDLCALC 69 03/03/2021   LDLDIRECT 85.0 03/14/2023   TRIG 289.0 (H) 03/14/2023   CHOLHDL 4 03/14/2023   Atorvastatin 40 mg daily  Went up on dose   Eats some breakfast meats  Bacon and sausage   Exercise Walking - about 20-30 minutes per day   Patient Active Problem List   Diagnosis Date  Noted   Skin cancer screening 01/27/2023   Encounter for screening mammogram for breast cancer 07/25/2022   Torn rotator cuff 03/18/2022   Medicare annual wellness visit, subsequent 01/24/2022   Cold hands and feet 10/08/2020   History of melanoma in situ 01/07/2018   Welcome to Medicare preventive visit 12/23/2016   Screening mammogram, encounter for 12/23/2016   Estrogen deficiency 12/23/2016   Low back pain 04/12/2016   Foot pain, left 12/23/2015   Urge incontinence 12/23/2015   Anemia, iron deficiency 02/19/2015   Vitamin B 12 deficiency 12/05/2014   Fatigue 11/26/2014   Left shoulder pain 11/26/2014   Grief reaction 01/22/2014   Colon cancer screening 10/24/2012   Class 1 obesity with serious comorbidity and body mass index (BMI) of 34.0 to 34.9 in adult 10/19/2011   Routine general medical examination at a health care facility 04/07/2011   UTI'S, RECURRENT 02/03/2010   Hypothyroidism 11/24/2006   Diabetes mellitus treated with oral medication (HCC) 11/24/2006   Hyperlipidemia associated with type 2 diabetes mellitus (HCC) 11/24/2006   RESTLESS LEG SYNDROME 11/24/2006   Hypertension associated with diabetes (HCC) 11/24/2006   PREMATURE  VENTRICULAR CONTRACTIONS 11/24/2006   Hemorrhoids 11/24/2006   Allergic rhinitis 11/24/2006   GERD 11/24/2006   Diaphragmatic hernia 11/24/2006   OVERACTIVE BLADDER 11/24/2006   DISC DISEASE, CERVICAL 11/24/2006   Sleep apnea 11/24/2006   Past Medical History:  Diagnosis Date   Allergic rhinitis    Allergy    Anxiety    Arthritis    osteoarthritis   Asthma    As a child    Cataract Mother--Donna   Depression    Diabetes mellitus    Type II (2/06 elevated microalbumin)   Frequent UTI    with coital prohylaxis    GERD (gastroesophageal reflux disease)    Glaucoma Mother--Donna   Hyperlipidemia    Hypertension    Hypothyroidism    IDA (iron deficiency anemia)    Obesity    OSA (obstructive sleep apnea)    CPAP   Sleep apnea     wears c-pap   Past Surgical History:  Procedure Laterality Date   BELPHAROPTOSIS REPAIR     COLONOSCOPY     DILATION AND CURETTAGE OF UTERUS     ENDOMETRIAL BIOPSY     FOOT SURGERY     UPPER GASTROINTESTINAL ENDOSCOPY     Social History   Tobacco Use   Smoking status: Never   Smokeless tobacco: Never  Vaping Use   Vaping status: Never Used  Substance Use Topics   Alcohol use: Not Currently    Comment: Occasional glass of wine or mixed drink   Drug use: Never   Family History  Problem Relation Age of Onset   Diabetes Mother    Coronary artery disease Mother    Hypothyroidism Mother    Irritable bowel syndrome Mother    Hyperlipidemia Mother    Hypertension Mother    Obesity Mother    Vision loss Mother    Alzheimer's disease Father    Nephrolithiasis Father    Arthritis Father    Varicose Veins Father    Hypothyroidism Brother    Breast cancer Paternal Grandmother 78   Colon cancer Neg Hx    Esophageal cancer Neg Hx    Rectal cancer Neg Hx    Stomach cancer Neg Hx    No Known Allergies Current Outpatient Medications on File Prior to Visit  Medication Sig Dispense Refill   acetaminophen (TYLENOL) 325 MG tablet Take 650 mg by mouth as needed for pain.     albuterol (PROAIR HFA) 108 (90 Base) MCG/ACT inhaler INHALE TWO PUFFS BY MOUTH UP TO EVERY 4 HOURS AS NEEDED FOR WHEEZING 8.5 g 5   Ascorbic Acid (VITAMIN C) 100 MG tablet Take 100 mg by mouth daily.     atorvastatin (LIPITOR) 40 MG tablet Take 1 tablet (40 mg total) by mouth daily. 90 tablet 3   calcium carbonate (OS-CAL) 600 MG TABS tablet Take 600 mg by mouth 2 (two) times daily with a meal.     Cholecalciferol 4000 UNITS CAPS Take 4,000 Units by mouth daily. Pt takes 2 tablets of 2,000 units per day = 4,000 units a day     Cyanocobalamin (VITAMIN B-12 PO) Take 500 mcg by mouth daily.     fluticasone-salmeterol (ADVAIR) 100-50 MCG/ACT AEPB Inhale 1 puff into the lungs 2 (two) times daily as needed.      glipiZIDE (GLUCOTROL XL) 10 MG 24 hr tablet TAKE 1 TABLET BY MOUTH DAILY 90 tablet 2   glucose blood (ACCU-CHEK GUIDE) test strip USE TO CHECK BLOOD SUGAR ONCE DAILY. Dx E11.9  100 strip 3   hyoscyamine (LEVSIN SL) 0.125 MG SL tablet Place one tablet under tongue once a day as needed. 30 tablet 1   IRON, FERROUS GLUCONATE, PO Take 1 tablet by mouth 2 (two) times daily.      JANUVIA 100 MG tablet TAKE 1 TABLET BY MOUTH DAILY 90 tablet 0   levothyroxine (SYNTHROID) 75 MCG tablet TAKE 1 TABLET BY MOUTH DAILY BEFORE BREAKFAST 90 tablet 2   lisinopril-hydrochlorothiazide (ZESTORETIC) 10-12.5 MG tablet TAKE 1 TABLET BY MOUTH DAILY 90 tablet 0   loratadine (CLARITIN) 10 MG tablet Take 10 mg by mouth daily.     metFORMIN (GLUCOPHAGE) 1000 MG tablet TAKE 1 TABLET BY MOUTH TWICE A DAY WITH A MEAL 180 tablet 0   methocarbamol (ROBAXIN) 500 MG tablet Take 1 tablet (500 mg total) by mouth every 6 (six) hours as needed for muscle spasms. 60 tablet 3   multivitamin (THERAGRAN) per tablet Take 1 tablet by mouth daily.     No current facility-administered medications on file prior to visit.    Review of Systems  Constitutional:  Negative for activity change, appetite change, fatigue, fever and unexpected weight change.  HENT:  Negative for congestion, ear pain, rhinorrhea, sinus pressure and sore throat.   Eyes:  Negative for pain, redness and visual disturbance.  Respiratory:  Negative for cough, shortness of breath and wheezing.   Cardiovascular:  Negative for chest pain and palpitations.  Gastrointestinal:  Negative for abdominal pain, blood in stool, constipation and diarrhea.  Endocrine: Negative for polydipsia and polyuria.  Genitourinary:  Negative for dysuria, frequency and urgency.  Musculoskeletal:  Positive for back pain. Negative for arthralgias and myalgias.  Skin:  Negative for pallor and rash.  Allergic/Immunologic: Negative for environmental allergies.  Neurological:  Negative for dizziness,  syncope and headaches.  Hematological:  Negative for adenopathy. Does not bruise/bleed easily.  Psychiatric/Behavioral:  Negative for decreased concentration and dysphoric mood. The patient is not nervous/anxious.        Objective:   Physical Exam Constitutional:      General: She is not in acute distress.    Appearance: Normal appearance. She is well-developed. She is obese. She is not ill-appearing or diaphoretic.  HENT:     Head: Normocephalic and atraumatic.  Eyes:     Conjunctiva/sclera: Conjunctivae normal.     Pupils: Pupils are equal, round, and reactive to light.  Neck:     Thyroid: No thyromegaly.     Vascular: No carotid bruit or JVD.  Cardiovascular:     Rate and Rhythm: Normal rate and regular rhythm.     Heart sounds: Normal heart sounds.     No gallop.  Pulmonary:     Effort: Pulmonary effort is normal. No respiratory distress.     Breath sounds: Normal breath sounds. No wheezing or rales.  Abdominal:     General: There is no distension or abdominal bruit.     Palpations: Abdomen is soft.  Musculoskeletal:     Cervical back: Normal range of motion and neck supple.     Right lower leg: No edema.     Left lower leg: No edema.     Comments: Normal gait today    Lymphadenopathy:     Cervical: No cervical adenopathy.  Skin:    General: Skin is warm and dry.     Coloration: Skin is not pale.     Findings: No rash.  Neurological:     Mental Status: She is alert.  Coordination: Coordination normal.     Gait: Gait normal.     Deep Tendon Reflexes: Reflexes are normal and symmetric. Reflexes normal.  Psychiatric:        Mood and Affect: Mood normal.           Assessment & Plan:   Problem List Items Addressed This Visit       Cardiovascular and Mediastinum   Hypertension associated with diabetes (HCC)    bp in fair control at this time  BP Readings from Last 1 Encounters:  08/01/23 112/66   No changes needed Most recent labs reviewed  Disc  lifstyle change with low sodium diet and exercise  Plan to continue lisinopril hct 10-12.5 mg daily   Lab today for cmet      Relevant Orders   Comprehensive metabolic panel     Endocrine   Hypothyroidism    Hypothyroidism  Pt has no clinical changes No change in energy level/ hair or skin/ edema and no tremor Lab Results  Component Value Date   TSH 3.41 01/18/2023    Continues levothyroxine 75 mcg daily      Hyperlipidemia associated with type 2 diabetes mellitus (HCC)    Disc goals for lipids and reasons to control them Rev last labs with pt Rev low sat fat diet in detail Last LDL was 85 (goal under 70)  Went up to atorvastatin 40  Diet not optimal- encouraged to change the fatty breakfast meats and biscuits   Lab today      Relevant Orders   Lipid panel   Diabetes mellitus treated with oral medication (HCC) - Primary    Lab Results  Component Value Date   HGBA1C 6.5 (A) 08/01/2023   This is stable  Januvia 100 mg daily  Glipizide xl 10 mg daily  Metformin 1000 mg bid   Given the option of change from glipizide to glp-1 drug to help with weight loss  Given info on ozempic to read  Disc option of GLP medication including possible side effects like GI intolerance and risk of thyroid and endocrine cancer, pancreatitis and gallstones, kidney problems and diabetic retinopathy  She will call if interested May be a good candidate  Normal foot exam today Microalb utd Sent for eye exam report         Other   Low back pain    Low back has been bothering her more lately  Midline No radicular symptoms   Discussed heat and rehab Handout given for rehab for low back strain  Also on strain and prevention of injury   May need to strengthen core  Encouraged to keep walking   Call back and Er precautions noted in detail today   Update if not starting to improve in a week or if worsening  - will follow up

## 2023-08-01 NOTE — Assessment & Plan Note (Signed)
Hypothyroidism  Pt has no clinical changes No change in energy level/ hair or skin/ edema and no tremor Lab Results  Component Value Date   TSH 3.41 01/18/2023    Continues levothyroxine 75 mcg daily

## 2023-08-01 NOTE — Assessment & Plan Note (Signed)
Disc goals for lipids and reasons to control them Rev last labs with pt Rev low sat fat diet in detail Last LDL was 85 (goal under 70)  Went up to atorvastatin 40  Diet not optimal- encouraged to change the fatty breakfast meats and biscuits   Lab today

## 2023-08-01 NOTE — Assessment & Plan Note (Signed)
Low back has been bothering her more lately  Midline No radicular symptoms   Discussed heat and rehab Handout given for rehab for low back strain  Also on strain and prevention of injury   May need to strengthen core  Encouraged to keep walking   Call back and Er precautions noted in detail today   Update if not starting to improve in a week or if worsening  - will follow up

## 2023-08-01 NOTE — Assessment & Plan Note (Signed)
Lab Results  Component Value Date   HGBA1C 6.5 (A) 08/01/2023   This is stable  Januvia 100 mg daily  Glipizide xl 10 mg daily  Metformin 1000 mg bid   Given the option of change from glipizide to glp-1 drug to help with weight loss  Given info on ozempic to read  Disc option of GLP medication including possible side effects like GI intolerance and risk of thyroid and endocrine cancer, pancreatitis and gallstones, kidney problems and diabetic retinopathy  She will call if interested May be a good candidate  Normal foot exam today Microalb utd Sent for eye exam report

## 2023-08-02 ENCOUNTER — Encounter: Payer: Self-pay | Admitting: Family Medicine

## 2023-08-02 ENCOUNTER — Encounter: Payer: Self-pay | Admitting: *Deleted

## 2023-08-22 ENCOUNTER — Encounter: Payer: Self-pay | Admitting: Family Medicine

## 2023-08-22 ENCOUNTER — Ambulatory Visit
Admission: RE | Admit: 2023-08-22 | Discharge: 2023-08-22 | Disposition: A | Discharge status: Home / Self Care | Payer: Medicare PPO | Source: Ambulatory Visit | Attending: Family Medicine | Admitting: Family Medicine

## 2023-08-22 DIAGNOSIS — E2839 Other primary ovarian failure: Secondary | ICD-10-CM | POA: Insufficient documentation

## 2023-08-22 DIAGNOSIS — M8580 Other specified disorders of bone density and structure, unspecified site: Secondary | ICD-10-CM | POA: Insufficient documentation

## 2023-08-22 DIAGNOSIS — Z1231 Encounter for screening mammogram for malignant neoplasm of breast: Secondary | ICD-10-CM | POA: Insufficient documentation

## 2023-08-22 DIAGNOSIS — Z780 Asymptomatic menopausal state: Secondary | ICD-10-CM | POA: Diagnosis not present

## 2023-08-23 DIAGNOSIS — E785 Hyperlipidemia, unspecified: Secondary | ICD-10-CM | POA: Diagnosis not present

## 2023-08-23 DIAGNOSIS — E669 Obesity, unspecified: Secondary | ICD-10-CM | POA: Diagnosis not present

## 2023-08-23 DIAGNOSIS — G4733 Obstructive sleep apnea (adult) (pediatric): Secondary | ICD-10-CM | POA: Diagnosis not present

## 2023-08-23 DIAGNOSIS — G8929 Other chronic pain: Secondary | ICD-10-CM | POA: Diagnosis not present

## 2023-08-23 DIAGNOSIS — M62838 Other muscle spasm: Secondary | ICD-10-CM | POA: Diagnosis not present

## 2023-08-23 DIAGNOSIS — E039 Hypothyroidism, unspecified: Secondary | ICD-10-CM | POA: Diagnosis not present

## 2023-08-23 DIAGNOSIS — I10 Essential (primary) hypertension: Secondary | ICD-10-CM | POA: Diagnosis not present

## 2023-08-23 DIAGNOSIS — M545 Low back pain, unspecified: Secondary | ICD-10-CM | POA: Diagnosis not present

## 2023-08-23 DIAGNOSIS — M8580 Other specified disorders of bone density and structure, unspecified site: Secondary | ICD-10-CM | POA: Diagnosis not present

## 2023-08-24 NOTE — Telephone Encounter (Signed)
Records abstracted to [facility]

## 2023-08-25 DIAGNOSIS — B029 Zoster without complications: Secondary | ICD-10-CM | POA: Diagnosis not present

## 2023-08-31 DIAGNOSIS — G4733 Obstructive sleep apnea (adult) (pediatric): Secondary | ICD-10-CM | POA: Diagnosis not present

## 2023-09-15 ENCOUNTER — Other Ambulatory Visit: Payer: Self-pay | Admitting: Family Medicine

## 2023-09-23 ENCOUNTER — Encounter: Payer: Self-pay | Admitting: Family Medicine

## 2023-09-25 NOTE — Telephone Encounter (Signed)
Please schedule appointment in office with first available  Yes- valtrex is given for one course for shingles/ we don't repeat it (good question) Sorry she is struggling

## 2023-09-26 ENCOUNTER — Ambulatory Visit: Payer: Medicare PPO | Admitting: Family Medicine

## 2023-09-26 ENCOUNTER — Encounter: Payer: Self-pay | Admitting: Family Medicine

## 2023-09-26 VITALS — BP 124/68 | HR 81 | Temp 99.1°F | Ht 63.75 in | Wt 197.5 lb

## 2023-09-26 DIAGNOSIS — R21 Rash and other nonspecific skin eruption: Secondary | ICD-10-CM | POA: Insufficient documentation

## 2023-09-26 DIAGNOSIS — E1159 Type 2 diabetes mellitus with other circulatory complications: Secondary | ICD-10-CM

## 2023-09-26 DIAGNOSIS — I152 Hypertension secondary to endocrine disorders: Secondary | ICD-10-CM | POA: Diagnosis not present

## 2023-09-26 DIAGNOSIS — B029 Zoster without complications: Secondary | ICD-10-CM | POA: Diagnosis not present

## 2023-09-26 NOTE — Progress Notes (Signed)
Subjective:    Patient ID: Brooke Mitchell, female    DOB: 1951-07-29, 73 y.o.   MRN: [id number]  HPI  Wt Readings from Last 3 Encounters:  09/26/23 197 lb 8 oz (89.6 kg)  08/01/23 198 lb (89.8 kg)  04/25/23 196 lb (88.9 kg)   34.17 kg/m  Vitals:   09/26/23 1236  BP: 124/68  Pulse: 81  Temp: 99.1 F (37.3 C)  SpO2: 97%   Pt presents for shingles re check  HTN     [facility] 12/20 Rash on left arm  Prescription valtrex 1 g tid for 7 d  It got much better  No pain remains /no itching    1/18- itching on upper back on the left side and then / then chest medial to axilla  Neck and upper back  Itching  Some discomfort -not severe  Did look like blisters  It is starting to regress   Used oral benadryl  Over the counter ointment  Terrasil -anti fungal   Did have both shingrix vaccines   HTN stable bp is stable today  No cp or palpitations or headaches or edema  No side effects to medicines  BP Readings from Last 3 Encounters:  09/26/23 124/68  08/01/23 112/66  01/27/23 118/74    Lisinopril hct 10-12.5 mg daily    Patient Active Problem List   Diagnosis Date Noted   Herpes zoster 09/26/2023   Rash and nonspecific skin eruption 09/26/2023   Osteopenia 08/22/2023   Skin cancer screening 01/27/2023   Encounter for screening mammogram for breast cancer 07/25/2022   Torn rotator cuff 03/18/2022   Medicare annual wellness visit, subsequent 01/24/2022   Cold hands and feet 10/08/2020   History of melanoma in situ 01/07/2018   Welcome to Medicare preventive visit 12/23/2016   Screening mammogram, encounter for 12/23/2016   Estrogen deficiency 12/23/2016   Low back pain 04/12/2016   Foot pain, left 12/23/2015   Urge incontinence 12/23/2015   Anemia, iron deficiency 02/19/2015   Vitamin B 12 deficiency 12/05/2014   Fatigue 11/26/2014   Left shoulder pain 11/26/2014   Grief reaction 01/22/2014   Colon cancer screening 10/24/2012   Class 1 obesity with  serious comorbidity and body mass index (BMI) of 34.0 to 34.9 in adult 10/19/2011   Routine general medical examination at a health care facility 04/07/2011   UTI'S, RECURRENT 02/03/2010   Hypothyroidism 11/24/2006   Diabetes mellitus treated with oral medication (HCC) 11/24/2006   Hyperlipidemia associated with type 2 diabetes mellitus (HCC) 11/24/2006   RESTLESS LEG SYNDROME 11/24/2006   Hypertension associated with diabetes (HCC) 11/24/2006   PREMATURE VENTRICULAR CONTRACTIONS 11/24/2006   Hemorrhoids 11/24/2006   Allergic rhinitis 11/24/2006   GERD 11/24/2006   Diaphragmatic hernia 11/24/2006   OVERACTIVE BLADDER 11/24/2006   DISC DISEASE, CERVICAL 11/24/2006   Sleep apnea 11/24/2006   Past Medical History:  Diagnosis Date   Allergic rhinitis    Allergy    Anxiety    Arthritis    osteoarthritis   Asthma    As a child    Cataract Mother--Donna   Depression    Diabetes mellitus    Type II (2/06 elevated microalbumin)   Frequent UTI    with coital prohylaxis    GERD (gastroesophageal reflux disease)    Glaucoma Mother--Donna   Hyperlipidemia    Hypertension    Hypothyroidism    IDA (iron deficiency anemia)    Obesity    OSA (obstructive sleep apnea)  CPAP   Sleep apnea    wears c-pap   Past Surgical History:  Procedure Laterality Date   BELPHAROPTOSIS REPAIR     COLONOSCOPY     DILATION AND CURETTAGE OF UTERUS     ENDOMETRIAL BIOPSY     FOOT SURGERY     UPPER GASTROINTESTINAL ENDOSCOPY     Social History   Tobacco Use   Smoking status: Never   Smokeless tobacco: Never  Vaping Use   Vaping status: Never Used  Substance Use Topics   Alcohol use: Not Currently    Comment: Occasional glass of wine or mixed drink   Drug use: Never   Family History  Problem Relation Age of Onset   Diabetes Mother    Coronary artery disease Mother    Hypothyroidism Mother    Irritable bowel syndrome Mother    Hyperlipidemia Mother    Hypertension Mother    Obesity  Mother    Vision loss Mother    Alzheimer's disease Father    Nephrolithiasis Father    Arthritis Father    Varicose Veins Father    Hypothyroidism Brother    Breast cancer Paternal Grandmother 53   Colon cancer Neg Hx    Esophageal cancer Neg Hx    Rectal cancer Neg Hx    Stomach cancer Neg Hx    No Known Allergies Current Outpatient Medications on File Prior to Visit  Medication Sig Dispense Refill   acetaminophen (TYLENOL) 325 MG tablet Take 650 mg by mouth as needed for pain.     albuterol (PROAIR HFA) 108 (90 Base) MCG/ACT inhaler INHALE TWO PUFFS BY MOUTH UP TO EVERY 4 HOURS AS NEEDED FOR WHEEZING 8.5 g 5   Ascorbic Acid (VITAMIN C) 100 MG tablet Take 100 mg by mouth daily.     atorvastatin (LIPITOR) 40 MG tablet Take 1 tablet (40 mg total) by mouth daily. 90 tablet 3   calcium carbonate (OS-CAL) 600 MG TABS tablet Take 600 mg by mouth 2 (two) times daily with a meal.     Cholecalciferol 4000 UNITS CAPS Take 4,000 Units by mouth daily. Pt takes 2 tablets of 2,000 units per day = 4,000 units a day     Cyanocobalamin (VITAMIN B-12 PO) Take 500 mcg by mouth daily.     fluticasone-salmeterol (ADVAIR) 100-50 MCG/ACT AEPB Inhale 1 puff into the lungs 2 (two) times daily as needed.     glipiZIDE (GLUCOTROL XL) 10 MG 24 hr tablet TAKE 1 TABLET BY MOUTH DAILY 90 tablet 2   glucose blood (ACCU-CHEK GUIDE) test strip USE TO CHECK BLOOD SUGAR ONCE DAILY. Dx E11.9 100 strip 3   hyoscyamine (LEVSIN SL) 0.125 MG SL tablet Place one tablet under tongue once a day as needed. 30 tablet 1   IRON, FERROUS GLUCONATE, PO Take 1 tablet by mouth 2 (two) times daily.      JANUVIA 100 MG tablet TAKE 1 TABLET BY MOUTH DAILY 90 tablet 2   levothyroxine (SYNTHROID) 75 MCG tablet TAKE 1 TABLET BY MOUTH DAILY BEFORE BREAKFAST 90 tablet 2   lisinopril-hydrochlorothiazide (ZESTORETIC) 10-12.5 MG tablet TAKE 1 TABLET BY MOUTH DAILY 90 tablet 0   loratadine (CLARITIN) 10 MG tablet Take 10 mg by mouth daily.      metFORMIN (GLUCOPHAGE) 1000 MG tablet TAKE 1 TABLET BY MOUTH TWICE A DAY WITH A MEAL 180 tablet 0   methocarbamol (ROBAXIN) 500 MG tablet Take 1 tablet (500 mg total) by mouth every 6 (six) hours as needed  for muscle spasms. 60 tablet 3   multivitamin (THERAGRAN) per tablet Take 1 tablet by mouth daily.     No current facility-administered medications on file prior to visit.    Review of Systems  Constitutional:  Negative for activity change, appetite change, fatigue, fever and unexpected weight change.  HENT:  Negative for congestion, ear pain, rhinorrhea, sinus pressure and sore throat.   Eyes:  Negative for pain, redness and visual disturbance.  Respiratory:  Negative for cough, shortness of breath and wheezing.   Cardiovascular:  Negative for chest pain and palpitations.  Gastrointestinal:  Negative for abdominal pain, blood in stool, constipation and diarrhea.  Endocrine: Negative for polydipsia and polyuria.  Genitourinary:  Negative for dysuria, frequency and urgency.  Musculoskeletal:  Negative for arthralgias, back pain and myalgias.  Skin:  Positive for rash. Negative for pallor and wound.  Allergic/Immunologic: Negative for environmental allergies.  Neurological:  Negative for dizziness, syncope and headaches.  Hematological:  Negative for adenopathy. Does not bruise/bleed easily.  Psychiatric/Behavioral:  Negative for decreased concentration and dysphoric mood. The patient is not nervous/anxious.        Objective:   Physical Exam Constitutional:      General: She is not in acute distress.    Appearance: Normal appearance. She is well-developed. She is obese. She is not ill-appearing or diaphoretic.  HENT:     Head: Normocephalic and atraumatic.  Eyes:     General:        Right eye: No discharge.        Left eye: No discharge.     Conjunctiva/sclera: Conjunctivae normal.     Pupils: Pupils are equal, round, and reactive to light.  Neck:     Thyroid: No thyromegaly.      Vascular: No carotid bruit or JVD.  Cardiovascular:     Rate and Rhythm: Normal rate and regular rhythm.     Heart sounds: Normal heart sounds.     No gallop.  Pulmonary:     Effort: Pulmonary effort is normal. No respiratory distress.     Breath sounds: Normal breath sounds. No wheezing or rales.  Abdominal:     General: There is no distension or abdominal bruit.     Palpations: Abdomen is soft.  Musculoskeletal:     Cervical back: Normal range of motion and neck supple.     Right lower leg: No edema.     Left lower leg: No edema.  Lymphadenopathy:     Cervical: No cervical adenopathy.  Skin:    General: Skin is warm and dry.     Coloration: Skin is not pale.     Findings: No rash.     Comments: Rash on left arm is completely healed   Dry erythematous rash made of excoriations on back of neck/ left upper back and small area on left anterior chest  No vesicles noted Few papules Very dry and irritated Non tender No drainage   Neurological:     Mental Status: She is alert.     Coordination: Coordination normal.     Deep Tendon Reflexes: Reflexes are normal and symmetric. Reflexes normal.  Psychiatric:        Mood and Affect: Mood normal.           Assessment & Plan:   Problem List Items Addressed This Visit       Cardiovascular and Mediastinum   Hypertension associated with diabetes (HCC)   bp in fair control at this time  BP Readings from Last 1 Encounters:  09/26/23 124/68   No changes needed Most recent labs reviewed  Disc lifstyle change with low sodium diet and exercise  Plan to continue lisinopril hct 10-12.5 mg daily   Lab Results  Component Value Date   NA 140 08/01/2023   K 4.2 08/01/2023   CO2 27 08/01/2023   GLUCOSE 141 (H) 08/01/2023   BUN 17 08/01/2023   CREATININE 0.98 08/01/2023   CALCIUM 9.4 08/01/2023   GFR 57.67 (L) 08/01/2023   GFRNONAA 84.03 03/16/2010           Nervous and Auditory   Herpes zoster - Primary   Pt had a  shingles outbreak (she is fully immunized with shingrix)  Outbreak was on left arm , was treated 08/25/23 with valtrex course This is healed Developed new rash over weekend - appears dry with excoriations (unable to tell if vesicular) on  left upper neck/back and some on right upper chest  ? If contiguous zoster vs other rash such as eczema  It does cross the midline slightly in neck area  Also improved   Recommend keeping clean with gentle soap and water Aquaphor prn  Stop using anti fungal  Will continue to monitor  Encouraged pt call if worse / pain/redness or more rash area          Musculoskeletal and Integument   Rash and nonspecific skin eruption   S/p zoster  Unsure if extension of zoster or other (such as eczema) See a/p for zoster   Encouraged to keep clean Avoid hot water  Use aquapor  Call back and Er precautions noted in detail today

## 2023-09-26 NOTE — Telephone Encounter (Signed)
Pt notified of Dr. Royden Purl comments and f/u appt scheduled with her

## 2023-09-26 NOTE — Assessment & Plan Note (Signed)
S/p zoster  Unsure if extension of zoster or other (such as eczema) See a/p for zoster   Encouraged to keep clean Avoid hot water  Use aquapor  Call back and Er precautions noted in detail today

## 2023-09-26 NOTE — Assessment & Plan Note (Signed)
bp in fair control at this time  BP Readings from Last 1 Encounters:  09/26/23 124/68   No changes needed Most recent labs reviewed  Disc lifstyle change with low sodium diet and exercise  Plan to continue lisinopril hct 10-12.5 mg daily   Lab Results  Component Value Date   NA 140 08/01/2023   K 4.2 08/01/2023   CO2 27 08/01/2023   GLUCOSE 141 (H) 08/01/2023   BUN 17 08/01/2023   CREATININE 0.98 08/01/2023   CALCIUM 9.4 08/01/2023   GFR 57.67 (L) 08/01/2023   GFRNONAA 84.03 03/16/2010

## 2023-09-26 NOTE — Patient Instructions (Signed)
Whatever skin areas feel dry or look red or abraded -use aquaphor  It should help heal scabbed and scratched areas  Keep clean with gentle soap and water (dove soap for sensitive skin)   Avoid hot water  Avoid hot conditions   Benadryl is fine for itch   Keep nails short Try not to scratch  A cool compress may help if you have an itchy spot     Watch for more rash  Watch for fever or other symptoms   Keep [place] posted

## 2023-09-26 NOTE — Assessment & Plan Note (Addendum)
Pt had a shingles outbreak (she is fully immunized with shingrix)  Outbreak was on left arm , was treated 08/25/23 with valtrex course This is healed Developed new rash over weekend - appears dry with excoriations (unable to tell if vesicular) on  left upper neck/back and some on right upper chest  ? If contiguous zoster vs other rash such as eczema  It does cross the midline slightly in neck area  Also improved   Recommend keeping clean with gentle soap and water Aquaphor prn  Stop using anti fungal  Will continue to monitor  Encouraged pt call if worse / pain/redness or more rash area

## 2023-10-05 IMAGING — US US BREAST*R* LIMITED INC AXILLA
1 series · 1 of 1 positions shown · non-contrast
Comparison: Previous exam(s).

CLINICAL DATA: Bilateral breast asymmetry seen on most recent
screening mammography.



[Series 1: us breast*right* limited inc axilla · 0.07mm/px · 1 of 1 slices shown]
[im 1/1]
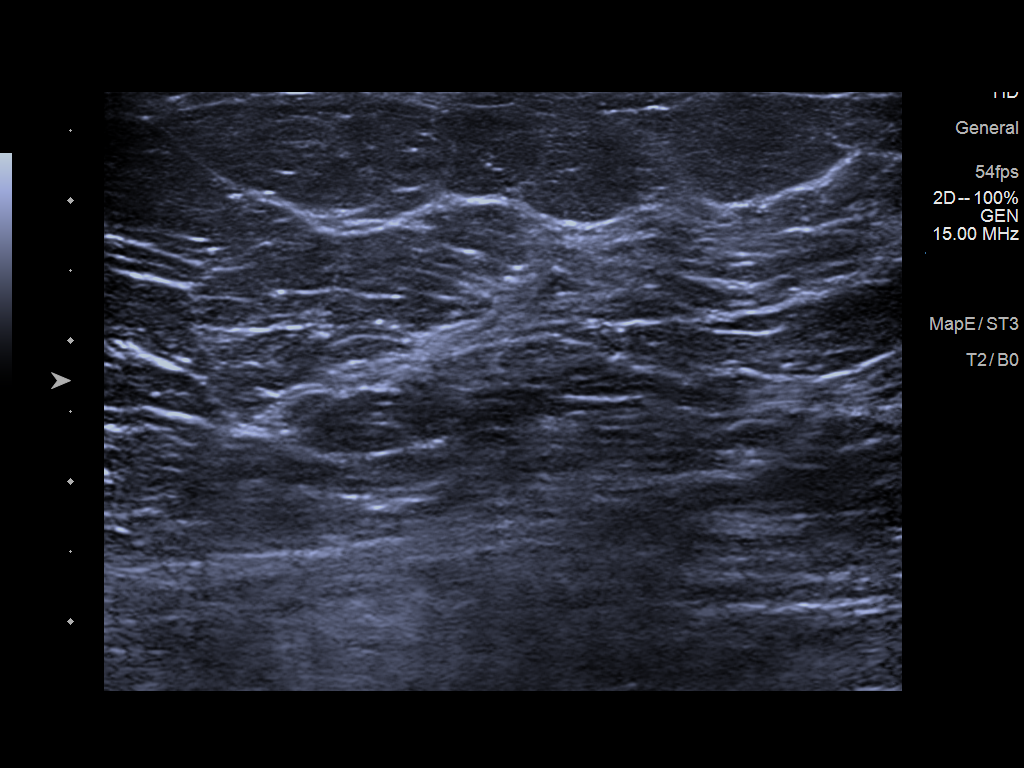

[1 of 1 positions shown; findings below may reference images not displayed]

ACR Breast Density Category c: The breast tissue is heterogeneously
dense, which may obscure small masses.
FINDINGS: Additional mammographic views of the bilateral breasts demonstrate
no suspicious masses or areas of architectural distortion. The
previously questioned asymmetries in the left upper breast, middle
depth and right breast upper outer quadrant efface to glandular
tissue. Scattered benign-appearing calcifications are present in
both breasts.

Targeted bilateral breast ultrasound is performed and demonstrates
no suspicious masses or shadowing lesions.
IMPRESSION: No mammographic or sonographic evidence of breast malignancy.

RECOMMENDATION:
Screening mammogram in one year.(Code:K0-8-R2P)

I have discussed the findings and recommendations with the patient.
If applicable, a reminder letter will be sent to the patient
regarding the next appointment.

BI-RADS CATEGORY  2: Benign.

## 2023-10-05 IMAGING — US US BREAST*L* LIMITED INC AXILLA
1 series · 2 of 2 positions shown · non-contrast
Comparison: Previous exam(s).

CLINICAL DATA: Bilateral breast asymmetry seen on most recent
screening mammography.



[Series 1: us breast*left* limited inc axilla · 0.07mm/px · 2 of 2 slices shown]
[im 1/2]
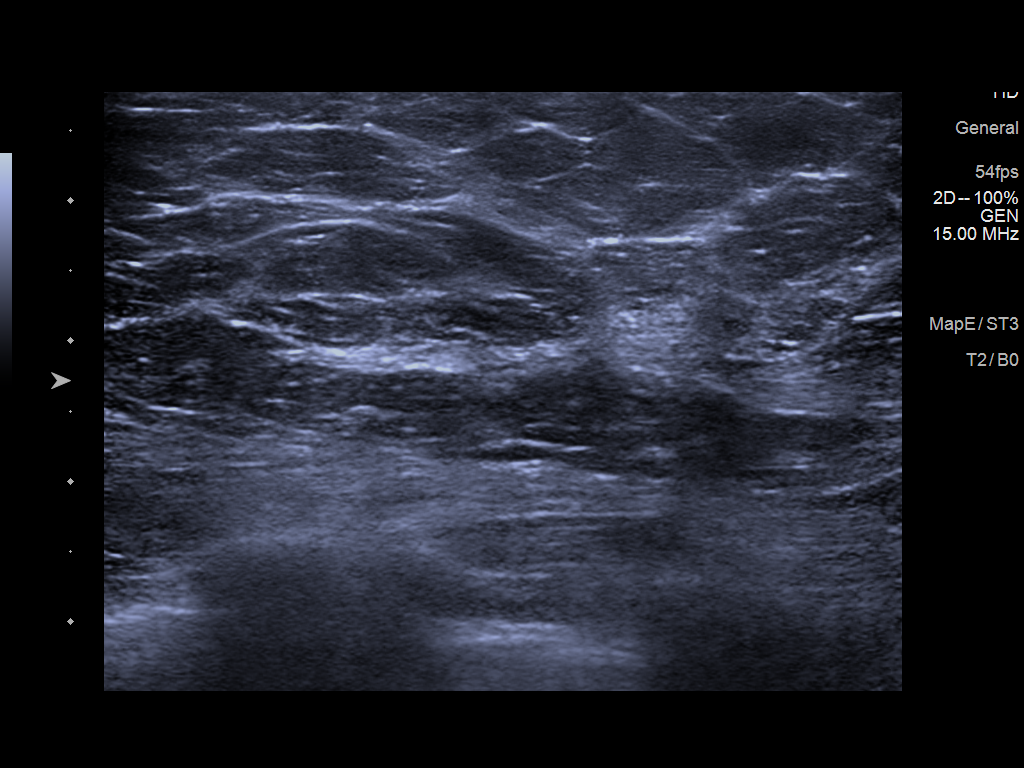
[im 2/2]
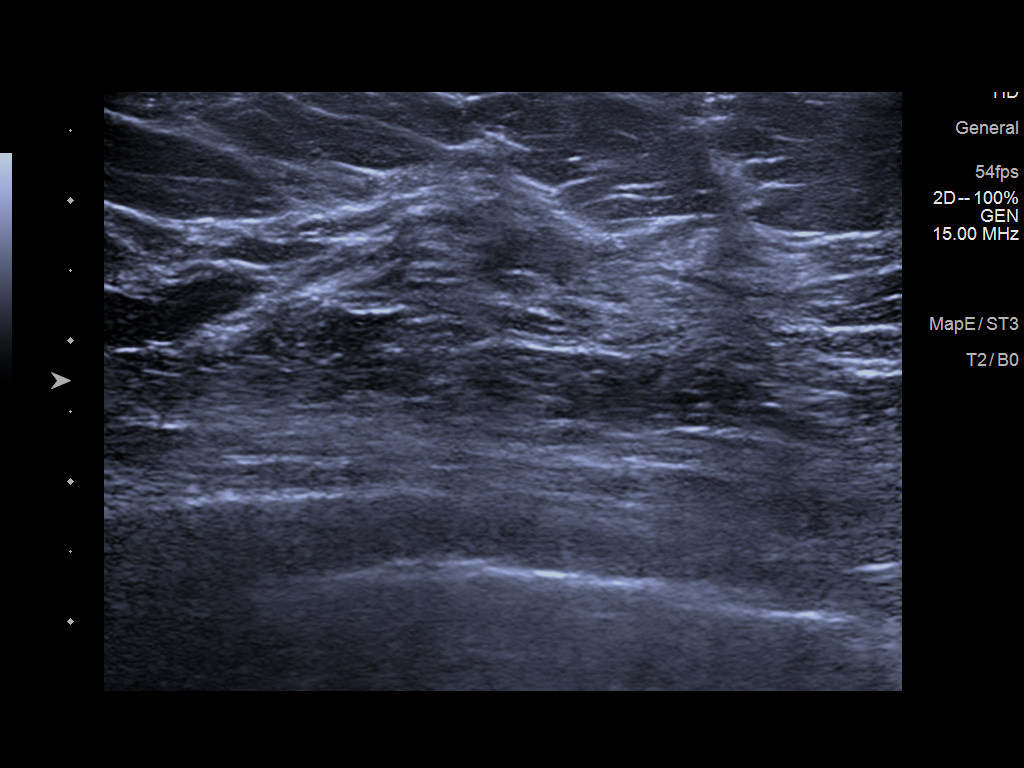

[2 of 2 positions shown; findings below may reference images not displayed]

ACR Breast Density Category c: The breast tissue is heterogeneously
dense, which may obscure small masses.
FINDINGS: Additional mammographic views of the bilateral breasts demonstrate
no suspicious masses or areas of architectural distortion. The
previously questioned asymmetries in the left upper breast, middle
depth and right breast upper outer quadrant efface to glandular
tissue. Scattered benign-appearing calcifications are present in
both breasts.

Targeted bilateral breast ultrasound is performed and demonstrates
no suspicious masses or shadowing lesions.
IMPRESSION: No mammographic or sonographic evidence of breast malignancy.

RECOMMENDATION:
Screening mammogram in one year.(Code:K0-8-R2P)

I have discussed the findings and recommendations with the patient.
If applicable, a reminder letter will be sent to the patient
regarding the next appointment.

BI-RADS CATEGORY  2: Benign.

## 2023-10-08 ENCOUNTER — Other Ambulatory Visit: Payer: Self-pay | Admitting: Family Medicine

## 2023-10-18 ENCOUNTER — Other Ambulatory Visit: Payer: Self-pay | Admitting: Family Medicine

## 2023-10-18 NOTE — Telephone Encounter (Signed)
Last Fill: Proair: 07/13/21    Advair: Unknown  Last OV: 09/26/23 Next OV: 11/02/23  Routing to provider for review/authorization.

## 2023-10-18 NOTE — Telephone Encounter (Signed)
Please check in with her about these ? Chronic  ? Last prescriber  Is condition flared?

## 2023-10-18 NOTE — Telephone Encounter (Unsigned)
Copied from CRM [id number]. Topic: Clinical - Medication Refill >> Oct 18, 2023  3:45 PM Isabell A wrote: Most Recent Primary Care Visit:  Provider: Roxy Manns A  Department: [facility]  Visit Type: OFFICE VISIT  Date: 09/26/2023  Medication: albuterol (PROAIR HFA) 108 (90 Base) MCG/ACT inhaler fluticasone-salmeterol (ADVAIR) 100-50 MCG/ACT AEPB   Has the patient contacted their pharmacy? Yes, no additional refills.  (Agent: If no, request that the patient contact the pharmacy for the refill. If patient does not wish to contact the pharmacy document the reason why and proceed with request.) (Agent: If yes, when and what did the pharmacy advise?)  Is this the correct pharmacy for this prescription? Yes If no, delete pharmacy and type the correct one.  This is the patient's preferred pharmacy:  [facility] [id number] [place], [place] - [address] [place] [address] Phone: [phone] Fax: [phone]   Has the prescription been filled recently? No  Is the patient out of the medication? Yes  Has the patient been seen for an appointment in the last year OR does the patient have an upcoming appointment? Yes  Can we respond through MyChart? Yes  Agent: Please be advised that Rx refills may take up to 3 business days. We ask that you follow-up with your pharmacy.

## 2023-10-18 NOTE — Telephone Encounter (Signed)
Will route to PCP Proair hasn't been filled since 2022 and Advair is on list as historical entry   F/u scheduled 11/02/23

## 2023-10-20 ENCOUNTER — Encounter: Payer: Self-pay | Admitting: Family Medicine

## 2023-10-20 ENCOUNTER — Ambulatory Visit: Payer: Medicare PPO | Admitting: Family Medicine

## 2023-10-20 VITALS — BP 132/60 | HR 112 | Temp 100.3°F | Ht 63.75 in | Wt 196.5 lb

## 2023-10-20 DIAGNOSIS — J101 Influenza due to other identified influenza virus with other respiratory manifestations: Secondary | ICD-10-CM

## 2023-10-20 DIAGNOSIS — R509 Fever, unspecified: Secondary | ICD-10-CM | POA: Diagnosis not present

## 2023-10-20 DIAGNOSIS — J4520 Mild intermittent asthma, uncomplicated: Secondary | ICD-10-CM | POA: Diagnosis not present

## 2023-10-20 LAB — POC COVID19 BINAXNOW: SARS Coronavirus 2 Ag: NEGATIVE

## 2023-10-20 LAB — POC INFLUENZA A&B (BINAX/QUICKVUE)
Influenza A, POC: POSITIVE — AB
Influenza B, POC: NEGATIVE

## 2023-10-20 MED ORDER — FLUTICASONE-SALMETEROL 100-50 MCG/ACT IN AEPB: 1.0000 | INHALATION_SPRAY | Freq: Two times a day (BID) | RESPIRATORY_TRACT | 0 refills | Status: AC | PRN

## 2023-10-20 MED ORDER — ALBUTEROL SULFATE HFA 108 (90 BASE) MCG/ACT IN AERS: INHALATION_SPRAY | RESPIRATORY_TRACT | 0 refills | Status: AC

## 2023-10-20 MED ORDER — OSELTAMIVIR PHOSPHATE 75 MG PO CAPS: 75.0000 mg | ORAL_CAPSULE | Freq: Two times a day (BID) | ORAL | 0 refills | Status: DC

## 2023-10-20 MED ORDER — GUAIFENESIN-CODEINE 100-10 MG/5ML PO SOLN
5.0000 mL | Freq: Every day | ORAL | 0 refills | Status: DC
Start: 2023-10-20 — End: 2023-11-02

## 2023-10-20 NOTE — Patient Instructions (Signed)
Can use Mucinex DM for daytime cough, low sugar NO decongestant.  Prescription cough suppressant at night.  Complete tamiflu.  Use albuterol for rescue.  Tylenol for headache , fever.  Push fluids, rest.

## 2023-10-20 NOTE — Progress Notes (Signed)
Patient ID: Brooke Mitchell, female    DOB: 1950/10/01, 74 y.o.   MRN: [id number]  This visit was conducted in person.  BP 132/60 (BP Location: Right Arm, Patient Position: Sitting, Cuff Size: Large)   Pulse (!) 112   Temp 100.3 F (37.9 C) (Temporal) Comment (Src): tem  Ht 5' 3.75" (1.619 m)   Wt 196 lb 8 oz (89.1 kg)   SpO2 95%   BMI 33.99 kg/m    CC:  Chief Complaint  Patient presents with   Hyperglycemia   Nasal Congestion   Sore Throat    Scrathchy    Subjective:   HPI: Brooke Mitchell is a 73 y.o. 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 No SOB, no wheeze.  Cough keeping her up at night.   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 COVID testing:   none     She has tried to treat with  ES tylenol    History of  mild intermittent asthma, well controlled until this illness... has not ahd yto take Adviar /albuterol in several years. Non-smoker.  DM.. recent CBGs higher than usual FBS 240     Relevant past medical, surgical, family and social history reviewed and updated as indicated. Interim medical history since our last visit reviewed. Allergies and medications reviewed and updated. Outpatient Medications Prior to Visit  Medication Sig Dispense Refill   acetaminophen (TYLENOL) 325 MG tablet Take 650 mg by mouth as needed for pain.     Ascorbic Acid (VITAMIN C) 100 MG tablet Take 100 mg by mouth daily.     atorvastatin (LIPITOR) 40 MG tablet Take 1 tablet (40 mg total) by mouth daily. 90 tablet 3   calcium carbonate (OS-CAL) 600 MG TABS tablet Take 600 mg by mouth 2 (two) times daily with a meal.     Cholecalciferol 4000 UNITS CAPS Take 4,000 Units by mouth daily. Pt takes 2 tablets of 2,000 units per day = 4,000 units a day     Cyanocobalamin  (VITAMIN B-12 PO) Take 500 mcg by mouth daily.     glipiZIDE (GLUCOTROL XL) 10 MG 24 hr tablet TAKE 1 TABLET BY MOUTH DAILY 90 tablet 2   glucose blood (ACCU-CHEK GUIDE) test strip USE TO CHECK BLOOD SUGAR ONCE DAILY. Dx E11.9 100 strip 3   hyoscyamine (LEVSIN SL) 0.125 MG SL tablet Place one tablet under tongue once a day as needed. 30 tablet 1   IRON, FERROUS GLUCONATE, PO Take 1 tablet by mouth 2 (two) times daily.      JANUVIA 100 MG tablet TAKE 1 TABLET BY MOUTH DAILY 90 tablet 2   levothyroxine (SYNTHROID) 75 MCG tablet TAKE 1 TABLET BY MOUTH DAILY BEFORE BREAKFAST 90 tablet 2   lisinopril-hydrochlorothiazide (ZESTORETIC) 10-12.5 MG tablet TAKE 1 TABLET BY MOUTH DAILY 90 tablet 1   loratadine (CLARITIN) 10 MG tablet Take 10 mg by mouth daily.     metFORMIN (GLUCOPHAGE) 1000 MG tablet TAKE 1 TABLET BY MOUTH TWICE A DAY WITH A MEAL 180 tablet 1   methocarbamol (ROBAXIN) 500 MG tablet Take 1 tablet (500 mg total) by mouth every 6 (six) hours as needed for muscle spasms. 60 tablet 3   multivitamin (THERAGRAN) per tablet Take 1 tablet by mouth daily.     albuterol (PROAIR  HFA) 108 (90 Base) MCG/ACT inhaler INHALE TWO PUFFS BY MOUTH UP TO EVERY 4 HOURS AS NEEDED FOR WHEEZING 8.5 g 5   fluticasone-salmeterol (ADVAIR) 100-50 MCG/ACT AEPB Inhale 1 puff into the lungs 2 (two) times daily as needed.     No facility-administered medications prior to visit.     Per HPI unless specifically indicated in ROS section below Review of Systems  Constitutional:  Negative for fatigue and fever.  HENT:  Positive for congestion.   Eyes:  Negative for pain.  Respiratory:  Positive for cough. Negative for shortness of breath.   Cardiovascular:  Negative for chest pain, palpitations and leg swelling.  Gastrointestinal:  Negative for abdominal pain.  Genitourinary:  Negative for dysuria and vaginal bleeding.  Musculoskeletal:  Negative for back pain.  Neurological:  Negative for syncope, light-headedness and  headaches.  Psychiatric/Behavioral:  Negative for dysphoric mood.    Objective:  BP 132/60 (BP Location: Right Arm, Patient Position: Sitting, Cuff Size: Large)   Pulse (!) 112   Temp 100.3 F (37.9 C) (Temporal) Comment (Src): tem  Ht 5' 3.75" (1.619 m)   Wt 196 lb 8 oz (89.1 kg)   SpO2 95%   BMI 33.99 kg/m   Wt Readings from Last 3 Encounters:  10/20/23 196 lb 8 oz (89.1 kg)  09/26/23 197 lb 8 oz (89.6 kg)  08/01/23 198 lb (89.8 kg)      Physical Exam Constitutional:      General: She is not in acute distress.    Appearance: She is well-developed. She is not ill-appearing or toxic-appearing.  HENT:     Head: Normocephalic.     Right Ear: Hearing, tympanic membrane, ear canal and external ear normal. Tympanic membrane is not erythematous, retracted or bulging.     Left Ear: Hearing, tympanic membrane, ear canal and external ear normal. Tympanic membrane is not erythematous, retracted or bulging.     Nose: Mucosal edema and rhinorrhea present.     Right Sinus: No maxillary sinus tenderness or frontal sinus tenderness.     Left Sinus: No maxillary sinus tenderness or frontal sinus tenderness.     Mouth/Throat:     Pharynx: Uvula midline.  Eyes:     General: Lids are normal. Lids are everted, no foreign bodies appreciated.     Conjunctiva/sclera: Conjunctivae normal.     Pupils: Pupils are equal, round, and reactive to light.  Neck:     Thyroid: No thyroid mass or thyromegaly.     Vascular: No carotid bruit.     Trachea: Trachea normal.  Cardiovascular:     Rate and Rhythm: Normal rate and regular rhythm.     Pulses: Normal pulses.     Heart sounds: Normal heart sounds, S1 normal and S2 normal. No murmur heard.    No friction rub. No gallop.  Pulmonary:     Effort: Pulmonary effort is normal. No tachypnea or respiratory distress.     Breath sounds: Rhonchi present. No decreased breath sounds, wheezing or rales.  Musculoskeletal:     Cervical back: Normal range of motion  and neck supple.  Skin:    General: Skin is warm and dry.     Findings: No rash.  Neurological:     Mental Status: She is alert.  Psychiatric:        Mood and Affect: Mood is not anxious or depressed.        Speech: Speech normal.        Behavior: Behavior normal.  Behavior is cooperative.        Judgment: Judgment normal.       Results for orders placed or performed in visit on 10/20/23  POC COVID-19   Collection Time: 10/20/23 12:02 PM  Result Value Ref Range   SARS Coronavirus 2 Ag Negative Negative  POC Influenza A&B (Binax test)   Collection Time: 10/20/23 12:02 PM  Result Value Ref Range   Influenza A, POC Positive (A) Negative   Influenza B, POC Negative Negative    Assessment and Plan  Influenza A Assessment & Plan: Acute, patient increased risk for pulmonary complications from influenza given diabetes and asthma history.  Will treat with Tamiflu 75 mg p.o. twice daily x 5 days. Will also treat associated asthma exacerbation with albuterol 2 puffs every 4-6 hours as needed wheeze.  Given her worsening control of diabetes we will hold off on prednisone taper.  She will restart asthma controller: Advair 100/52 puffs twice daily. Reviewed expected course of flu and possible complications.  Return and ER precautions provided.  Discussed contagiousness and avoidance of immunocompromised people.    Mild intermittent asthma in adult without complication  Fever, unspecified fever cause -     POC COVID-19 BinaxNow -     POC Influenza A&B(BINAX/QUICKVUE)  Other orders -     Albuterol Sulfate HFA; INHALE TWO PUFFS BY MOUTH UP TO EVERY 4 HOURS AS NEEDED FOR WHEEZING  Dispense: 8.5 g; Refill: 0 -     Fluticasone-Salmeterol; Inhale 1 puff into the lungs 2 (two) times daily as needed.  Dispense: 60 each; Refill: 0 -     Oseltamivir Phosphate; Take 1 capsule (75 mg total) by mouth 2 (two) times daily.  Dispense: 10 capsule; Refill: 0 -     guaiFENesin-Codeine; Take 5-10 mLs by  mouth at bedtime.  Dispense: 100 mL; Refill: 0    No follow-ups on file.   Kerby Nora, MD

## 2023-10-25 DIAGNOSIS — J101 Influenza due to other identified influenza virus with other respiratory manifestations: Secondary | ICD-10-CM | POA: Insufficient documentation

## 2023-10-25 NOTE — Assessment & Plan Note (Signed)
Acute, patient increased risk for pulmonary complications from influenza given diabetes and asthma history.  Will treat with Tamiflu 75 mg p.o. twice daily x 5 days. Will also treat associated asthma exacerbation with albuterol 2 puffs every 4-6 hours as needed wheeze.  Given her worsening control of diabetes we will hold off on prednisone taper.  She will restart asthma controller: Advair 100/52 puffs twice daily. Reviewed expected course of flu and possible complications.  Return and ER precautions provided.  Discussed contagiousness and avoidance of immunocompromised people.

## 2023-11-02 ENCOUNTER — Ambulatory Visit: Payer: Medicare PPO | Admitting: Family Medicine

## 2023-11-02 ENCOUNTER — Encounter: Payer: Self-pay | Admitting: Family Medicine

## 2023-11-02 VITALS — BP 126/76 | HR 90 | Temp 99.4°F | Ht 63.75 in | Wt 194.4 lb

## 2023-11-02 DIAGNOSIS — J101 Influenza due to other identified influenza virus with other respiratory manifestations: Secondary | ICD-10-CM | POA: Diagnosis not present

## 2023-11-02 DIAGNOSIS — E66811 Obesity, class 1: Secondary | ICD-10-CM

## 2023-11-02 DIAGNOSIS — E1169 Type 2 diabetes mellitus with other specified complication: Secondary | ICD-10-CM

## 2023-11-02 DIAGNOSIS — I152 Hypertension secondary to endocrine disorders: Secondary | ICD-10-CM | POA: Diagnosis not present

## 2023-11-02 DIAGNOSIS — Z7984 Long term (current) use of oral hypoglycemic drugs: Secondary | ICD-10-CM

## 2023-11-02 DIAGNOSIS — E6609 Other obesity due to excess calories: Secondary | ICD-10-CM

## 2023-11-02 DIAGNOSIS — E785 Hyperlipidemia, unspecified: Secondary | ICD-10-CM

## 2023-11-02 DIAGNOSIS — E119 Type 2 diabetes mellitus without complications: Secondary | ICD-10-CM

## 2023-11-02 DIAGNOSIS — E1159 Type 2 diabetes mellitus with other circulatory complications: Secondary | ICD-10-CM | POA: Diagnosis not present

## 2023-11-02 DIAGNOSIS — Z6834 Body mass index (BMI) 34.0-34.9, adult: Secondary | ICD-10-CM

## 2023-11-02 DIAGNOSIS — G4733 Obstructive sleep apnea (adult) (pediatric): Secondary | ICD-10-CM | POA: Diagnosis not present

## 2023-11-02 LAB — POCT GLYCOSYLATED HEMOGLOBIN (HGB A1C): Hemoglobin A1C: 6.8 % — AB (ref 4.0–5.6)

## 2023-11-02 MED ORDER — TIRZEPATIDE 2.5 MG/0.5ML ~~LOC~~ SOAJ: 2.5000 mg | SUBCUTANEOUS | 0 refills | Status: DC

## 2023-11-02 NOTE — Assessment & Plan Note (Signed)
 With obesity   Prescription generic mounjaro for DM today = may also help obesity and osa

## 2023-11-02 NOTE — Assessment & Plan Note (Signed)
 Still mild cough  No fever at home Suspect temp was up today due to drinking hot coffee   Lung exam is reassuring Instructed pt to watch for worsening or new symptoms and call

## 2023-11-02 NOTE — Assessment & Plan Note (Signed)
 bp in fair control at this time  BP Readings from Last 1 Encounters:  11/02/23 126/76   No changes needed Most recent labs reviewed  Disc lifstyle change with low sodium diet and exercise  Plan to continue lisinopril hct 10-12.5 mg daily   Lab Results  Component Value Date   NA 140 08/01/2023   K 4.2 08/01/2023   CO2 27 08/01/2023   GLUCOSE 141 (H) 08/01/2023   BUN 17 08/01/2023   CREATININE 0.98 08/01/2023   CALCIUM 9.4 08/01/2023   GFR 57.67 (L) 08/01/2023   GFRNONAA 84.03 03/16/2010

## 2023-11-02 NOTE — Assessment & Plan Note (Signed)
 Discussed how this problem influences overall health and the risks it imposes  Reviewed plan for weight loss with lower calorie diet (via better food choices (lower glycemic and portion control) along with exercise building up to or more than 30 minutes 5 days per week including some aerobic activity and strength training    Sent in prescription for generic mounjaro for dm and osa as well  Suspect it would help with weight loss

## 2023-11-02 NOTE — Assessment & Plan Note (Addendum)
 Lab Results  Component Value Date   HGBA1C 6.8 (A) 11/02/2023   HGBA1C 6.5 (A) 08/01/2023   HGBA1C 6.5 01/18/2023   Glipizide xl 10 mg daily   no low readings Metformin 1000 mg bid   Pt is interested in glp-1 injectable   Will sent mounaro 2.5 mg weekly and hold the [place]  Disc option of GLP medication including possible side effects like GI intolerance and risk of thyroid and endocrine cancer, pancreatitis and gallstones, kidney problems and diabetic retinopathy  Info given Will see if approved Also obese and with osa   If starting -will follow up 1-2 mo  Instructed to call and hold med if intol side effects Strongly encouraged to start muscle building exercise to prevent muscle loss

## 2023-11-02 NOTE — Assessment & Plan Note (Signed)
 Disc goals for lipids and reasons to control them Rev last labs with pt Rev low sat fat diet in detail Last LDL 70 Atorvastatin 40

## 2023-11-02 NOTE — Patient Instructions (Addendum)
 When you are feeling better  Keep walking/ stay active  Add some strength training to your routine, this is important for bone and brain health and can reduce your risk of falls and help your body use insulin properly and regulate weight  Light weights, exercise bands , and internet videos are a good way to start  Yoga (chair or regular), machines , floor exercises or a gym with machines are also good options     I will send a prescription for generic mounjaro  If it gets covered- follow up 1-2 months after starting it  If start it and not tolerating - then stop it and let us know  If it gets approved then I want you hold the januvia and start it   If you take the mounjaro you MUST do muscle building exercise regularly to prevent muscle loss      Check your blood sugar daily - some days am / some days 2 hours after meal   Try not to skip meals

## 2023-11-02 NOTE — Progress Notes (Signed)
 Subjective:    Patient ID: Brooke Mitchell, female    DOB: 02/24/1951, 73 y.o.   MRN: [id number]  HPI  Wt Readings from Last 3 Encounters:  11/02/23 194 lb 6 oz (88.2 kg)  10/20/23 196 lb 8 oz (89.1 kg)  09/26/23 197 lb 8 oz (89.6 kg)   33.63 kg/m  Vitals:   11/02/23 0855  BP: 126/76  Pulse: 90  Temp: 99.4 F (37.4 C)  SpO2: 98%    Pt presents for 3 months follow up of DM2 and chronic health problems   She had the flu mid month Still coughing  Phlegm - ? If color  No wheezing  No shortness of breath  Temp at home is fine  No chills or body aches   Did drink coffee on way here  Temp 99.4     HTN bp is stable today  No cp or palpitations or headaches or edema  No side effects to medicines  BP Readings from Last 3 Encounters:  11/02/23 126/76  10/20/23 132/60  09/26/23 124/68    Lab Results  Component Value Date   NA 140 08/01/2023   K 4.2 08/01/2023   CO2 27 08/01/2023   GLUCOSE 141 (H) 08/01/2023   BUN 17 08/01/2023   CREATININE 0.98 08/01/2023   CALCIUM 9.4 08/01/2023   GFR 57.67 (L) 08/01/2023   GFRNONAA 84.03 03/16/2010     DM2 Lab Results  Component Value Date   HGBA1C 6.8 (A) 11/02/2023   HGBA1C 6.5 (A) 08/01/2023   HGBA1C 6.5 01/18/2023   Januvia 100 mg daily  Glipizide xl 10 mg daily  Metformin 1000 mg bid   Diet has been fair  Did not eat a lot with the flu - ate what she wanted   Blood sugars have been ok in am - most below 120  No lows    Prior to that tried to stay away from carbs  Before flu-no sweet drinks  No sweets   A little walking    Lab Results  Component Value Date   MICROALBUR 2.5 (H) 01/18/2023   MICROALBUR 0.9 01/17/2022        Hyperlipidemia Lab Results  Component Value Date   CHOL 167 08/01/2023   HDL 44.70 08/01/2023   LDLCALC 70 08/01/2023   LDLDIRECT 85.0 03/14/2023   TRIG 261.0 (H) 08/01/2023   CHOLHDL 4 08/01/2023   Atorvastatin 40 mg daily     Patient Active Problem List    Diagnosis Date Noted   Influenza A 10/25/2023   Herpes zoster 09/26/2023   Rash and nonspecific skin eruption 09/26/2023   Osteopenia 08/22/2023   Skin cancer screening 01/27/2023   Encounter for screening mammogram for breast cancer 07/25/2022   Torn rotator cuff 03/18/2022   Medicare annual wellness visit, subsequent 01/24/2022   Cold hands and feet 10/08/2020   History of melanoma in situ 01/07/2018   Welcome to Medicare preventive visit 12/23/2016   Screening mammogram, encounter for 12/23/2016   Estrogen deficiency 12/23/2016   Low back pain 04/12/2016   Foot pain, left 12/23/2015   Urge incontinence 12/23/2015   Anemia, iron deficiency 02/19/2015   Vitamin B 12 deficiency 12/05/2014   Fatigue 11/26/2014   Left shoulder pain 11/26/2014   Grief reaction 01/22/2014   Colon cancer screening 10/24/2012   Class 1 obesity with serious comorbidity and body mass index (BMI) of 34.0 to 34.9 in adult 10/19/2011   Routine general medical examination at a health care facility 04/07/2011  UTI'S, RECURRENT 02/03/2010   Hypothyroidism 11/24/2006   Diabetes mellitus treated with oral medication (HCC) 11/24/2006   Hyperlipidemia associated with type 2 diabetes mellitus (HCC) 11/24/2006   RESTLESS LEG SYNDROME 11/24/2006   Hypertension associated with diabetes (HCC) 11/24/2006   PREMATURE VENTRICULAR CONTRACTIONS 11/24/2006   Hemorrhoids 11/24/2006   Allergic rhinitis 11/24/2006   Asthma in adult 11/24/2006   GERD 11/24/2006   Diaphragmatic hernia 11/24/2006   OVERACTIVE BLADDER 11/24/2006   DISC DISEASE, CERVICAL 11/24/2006   Sleep apnea 11/24/2006   Past Medical History:  Diagnosis Date   Allergic rhinitis    Allergy    Anxiety    Arthritis    osteoarthritis   Asthma    As a child    Cataract Mother--Donna   Depression    Diabetes mellitus    Type II (2/06 elevated microalbumin)   Frequent UTI    with coital prohylaxis    GERD (gastroesophageal reflux disease)     Glaucoma Mother--Donna   Hyperlipidemia    Hypertension    Hypothyroidism    IDA (iron deficiency anemia)    Obesity    OSA (obstructive sleep apnea)    CPAP   Sleep apnea    wears c-pap   Past Surgical History:  Procedure Laterality Date   BELPHAROPTOSIS REPAIR     COLONOSCOPY     DILATION AND CURETTAGE OF UTERUS     ENDOMETRIAL BIOPSY     FOOT SURGERY     UPPER GASTROINTESTINAL ENDOSCOPY     Social History   Tobacco Use   Smoking status: Never   Smokeless tobacco: Never  Vaping Use   Vaping status: Never Used  Substance Use Topics   Alcohol use: Not Currently    Comment: Occasional glass of wine or mixed drink   Drug use: Never   Family History  Problem Relation Age of Onset   Diabetes Mother    Coronary artery disease Mother    Hypothyroidism Mother    Irritable bowel syndrome Mother    Hyperlipidemia Mother    Hypertension Mother    Obesity Mother    Vision loss Mother    Alzheimer's disease Father    Nephrolithiasis Father    Arthritis Father    Varicose Veins Father    Hypothyroidism Brother    Breast cancer Paternal Grandmother 10   Colon cancer Neg Hx    Esophageal cancer Neg Hx    Rectal cancer Neg Hx    Stomach cancer Neg Hx    No Known Allergies Current Outpatient Medications on File Prior to Visit  Medication Sig Dispense Refill   acetaminophen (TYLENOL) 325 MG tablet Take 650 mg by mouth as needed for pain.     albuterol (PROAIR HFA) 108 (90 Base) MCG/ACT inhaler INHALE TWO PUFFS BY MOUTH UP TO EVERY 4 HOURS AS NEEDED FOR WHEEZING 8.5 g 0   Ascorbic Acid (VITAMIN C) 100 MG tablet Take 100 mg by mouth daily.     atorvastatin (LIPITOR) 40 MG tablet Take 1 tablet (40 mg total) by mouth daily. 90 tablet 3   calcium carbonate (OS-CAL) 600 MG TABS tablet Take 600 mg by mouth 2 (two) times daily with a meal.     Cholecalciferol 4000 UNITS CAPS Take 4,000 Units by mouth daily. Pt takes 2 tablets of 2,000 units per day = 4,000 units a day      Cyanocobalamin (VITAMIN B-12 PO) Take 500 mcg by mouth daily.     fluticasone-salmeterol (ADVAIR) 100-50 MCG/ACT  AEPB Inhale 1 puff into the lungs 2 (two) times daily as needed. 60 each 0   glipiZIDE (GLUCOTROL XL) 10 MG 24 hr tablet TAKE 1 TABLET BY MOUTH DAILY 90 tablet 2   glucose blood (ACCU-CHEK GUIDE) test strip USE TO CHECK BLOOD SUGAR ONCE DAILY. Dx E11.9 100 strip 3   hyoscyamine (LEVSIN SL) 0.125 MG SL tablet Place one tablet under tongue once a day as needed. 30 tablet 1   IRON, FERROUS GLUCONATE, PO Take 1 tablet by mouth 2 (two) times daily.      levothyroxine (SYNTHROID) 75 MCG tablet TAKE 1 TABLET BY MOUTH DAILY BEFORE BREAKFAST 90 tablet 2   lisinopril-hydrochlorothiazide (ZESTORETIC) 10-12.5 MG tablet TAKE 1 TABLET BY MOUTH DAILY 90 tablet 1   loratadine (CLARITIN) 10 MG tablet Take 10 mg by mouth daily.     metFORMIN (GLUCOPHAGE) 1000 MG tablet TAKE 1 TABLET BY MOUTH TWICE A DAY WITH A MEAL 180 tablet 1   methocarbamol (ROBAXIN) 500 MG tablet Take 1 tablet (500 mg total) by mouth every 6 (six) hours as needed for muscle spasms. 60 tablet 3   multivitamin (THERAGRAN) per tablet Take 1 tablet by mouth daily.     No current facility-administered medications on file prior to visit.    Review of Systems  Constitutional:  Negative for activity change, appetite change, fatigue, fever and unexpected weight change.  HENT:  Negative for congestion, ear pain, rhinorrhea, sinus pressure and sore throat.   Eyes:  Negative for pain, redness and visual disturbance.  Respiratory:  Positive for cough. Negative for shortness of breath and wheezing.   Cardiovascular:  Negative for chest pain and palpitations.  Gastrointestinal:  Negative for abdominal pain, blood in stool, constipation and diarrhea.  Endocrine: Negative for polydipsia and polyuria.  Genitourinary:  Negative for dysuria, frequency and urgency.  Musculoskeletal:  Positive for arthralgias. Negative for back pain and myalgias.   Skin:  Negative for pallor and rash.  Allergic/Immunologic: Negative for environmental allergies.  Neurological:  Negative for dizziness, syncope and headaches.  Hematological:  Negative for adenopathy. Does not bruise/bleed easily.  Psychiatric/Behavioral:  Negative for decreased concentration and dysphoric mood. The patient is not nervous/anxious.        Objective:   Physical Exam Constitutional:      General: She is not in acute distress.    Appearance: Normal appearance. She is well-developed. She is obese. She is not ill-appearing or diaphoretic.  HENT:     Head: Normocephalic and atraumatic.  Eyes:     Conjunctiva/sclera: Conjunctivae normal.     Pupils: Pupils are equal, round, and reactive to light.  Neck:     Thyroid: No thyromegaly.     Vascular: No carotid bruit or JVD.  Cardiovascular:     Rate and Rhythm: Normal rate and regular rhythm.     Heart sounds: Normal heart sounds.     No gallop.  Pulmonary:     Effort: Pulmonary effort is normal. No respiratory distress.     Breath sounds: Normal breath sounds. No stridor. No wheezing, rhonchi or rales.  Abdominal:     General: There is no distension or abdominal bruit.     Palpations: Abdomen is soft.  Musculoskeletal:     Cervical back: Normal range of motion and neck supple.     Right lower leg: No edema.     Left lower leg: No edema.  Lymphadenopathy:     Cervical: No cervical adenopathy.  Skin:    General:  Skin is warm and dry.     Coloration: Skin is not pale.     Findings: No rash.  Neurological:     Mental Status: She is alert.     Coordination: Coordination normal.     Deep Tendon Reflexes: Reflexes are normal and symmetric. Reflexes normal.  Psychiatric:        Mood and Affect: Mood normal.           Assessment & Plan:   Problem List Items Addressed This Visit       Cardiovascular and Mediastinum   Hypertension associated with diabetes (HCC)   bp in fair control at this time  BP  Readings from Last 1 Encounters:  11/02/23 126/76   No changes needed Most recent labs reviewed  Disc lifstyle change with low sodium diet and exercise  Plan to continue lisinopril hct 10-12.5 mg daily   Lab Results  Component Value Date   NA 140 08/01/2023   K 4.2 08/01/2023   CO2 27 08/01/2023   GLUCOSE 141 (H) 08/01/2023   BUN 17 08/01/2023   CREATININE 0.98 08/01/2023   CALCIUM 9.4 08/01/2023   GFR 57.67 (L) 08/01/2023   GFRNONAA 84.03 03/16/2010         Relevant Medications   tirzepatide (MOUNJARO) 2.5 MG/0.5ML Pen     Respiratory   Sleep apnea   With obesity   Prescription generic mounjaro for DM today = may also help obesity and osa       Influenza A   Still mild cough  No fever at home Suspect temp was up today due to drinking hot coffee   Lung exam is reassuring Instructed pt to watch for worsening or new symptoms and call         Endocrine   Hyperlipidemia associated with type 2 diabetes mellitus (HCC)   Disc goals for lipids and reasons to control them Rev last labs with pt Rev low sat fat diet in detail Last LDL 70 Atorvastatin 40        Relevant Medications   tirzepatide (MOUNJARO) 2.5 MG/0.5ML Pen   Diabetes mellitus treated with oral medication (HCC)   Lab Results  Component Value Date   HGBA1C 6.8 (A) 11/02/2023   HGBA1C 6.5 (A) 08/01/2023   HGBA1C 6.5 01/18/2023   Glipizide xl 10 mg daily   no low readings Metformin 1000 mg bid   Pt is interested in glp-1 injectable   Will sent mounaro 2.5 mg weekly and hold the [place]  Disc option of GLP medication including possible side effects like GI intolerance and risk of thyroid and endocrine cancer, pancreatitis and gallstones, kidney problems and diabetic retinopathy  Info given Will see if approved Also obese and with osa   If starting -will follow up 1-2 mo  Instructed to call and hold med if intol side effects Strongly encouraged to start muscle building exercise to prevent  muscle loss       Relevant Medications   tirzepatide (MOUNJARO) 2.5 MG/0.5ML Pen     Other   Class 1 obesity with serious comorbidity and body mass index (BMI) of 34.0 to 34.9 in adult   Discussed how this problem influences overall health and the risks it imposes  Reviewed plan for weight loss with lower calorie diet (via better food choices (lower glycemic and portion control) along with exercise building up to or more than 30 minutes 5 days per week including some aerobic activity and strength training    Sent  in prescription for generic mounjaro for dm and osa as well  Suspect it would help with weight loss       Relevant Medications   tirzepatide (MOUNJARO) 2.5 MG/0.5ML Pen   Other Visit Diagnoses       Controlled type 2 diabetes mellitus without complication, without long-term current use of insulin (HCC)    -  Primary   Relevant Medications   tirzepatide (MOUNJARO) 2.5 MG/0.5ML Pen   Other Relevant Orders   POCT HgB A1C (Completed)

## 2023-11-07 DIAGNOSIS — H2513 Age-related nuclear cataract, bilateral: Secondary | ICD-10-CM | POA: Diagnosis not present

## 2023-11-07 DIAGNOSIS — H04123 Dry eye syndrome of bilateral lacrimal glands: Secondary | ICD-10-CM | POA: Diagnosis not present

## 2023-11-07 DIAGNOSIS — H40013 Open angle with borderline findings, low risk, bilateral: Secondary | ICD-10-CM | POA: Diagnosis not present

## 2023-11-07 DIAGNOSIS — H43812 Vitreous degeneration, left eye: Secondary | ICD-10-CM | POA: Diagnosis not present

## 2023-11-07 DIAGNOSIS — E119 Type 2 diabetes mellitus without complications: Secondary | ICD-10-CM | POA: Diagnosis not present

## 2023-11-07 LAB — HM DIABETES EYE EXAM

## 2023-11-08 ENCOUNTER — Telehealth: Payer: Self-pay

## 2023-11-08 NOTE — Telephone Encounter (Signed)
 Pharmacy Patient Advocate Encounter   Received notification from Onbase that prior authorization for [facility] 2.5MG /0.5ML auto-injectors is required/requested.   Insurance verification completed.   The patient is insured through [employer] .   Per test claim: PA required; PA started via CoverMyMeds. KEY [id number] . Waiting for clinical questions to populate.

## 2023-11-08 NOTE — Telephone Encounter (Signed)
 Clinical questions have been answered and PA submitted. PA currently Pending.

## 2023-11-09 ENCOUNTER — Encounter: Payer: Self-pay | Admitting: Internal Medicine

## 2023-11-09 ENCOUNTER — Other Ambulatory Visit (HOSPITAL_COMMUNITY): Payer: Self-pay

## 2023-11-09 NOTE — Telephone Encounter (Signed)
 Pharmacy Patient Advocate Encounter  Received notification from [facility] that Prior Authorization for [facility] 2.5MG /0.5ML auto-injectors has been APPROVED from 11/08/23 to 09/04/24. Ran test claim, Copay is $40. This test claim was processed through [facility]- copay amounts may vary at other pharmacies due to pharmacy/plan contracts, or as the patient moves through the different stages of their insurance plan.   PA #/Case ID/Reference #: [id number]

## 2023-11-13 NOTE — Telephone Encounter (Signed)
 Left VM letting pharmacy know PA approved. Sent mychart message letting pt know PA approved

## 2023-11-13 NOTE — Telephone Encounter (Signed)
 Sounds like it did - I will route to team to see if anything sounds wrong I have had many patients tell me they can barely feel it   Keep [place] posted with how you are feeling  Follow up with me in about a month

## 2023-12-04 ENCOUNTER — Other Ambulatory Visit: Payer: Self-pay | Admitting: Family Medicine

## 2023-12-04 MED ORDER — TIRZEPATIDE 5 MG/0.5ML ~~LOC~~ SOAJ: 5.0000 mg | SUBCUTANEOUS | 0 refills | Status: DC

## 2023-12-04 NOTE — Telephone Encounter (Signed)
 I pended the 5 mg dose if she is ok going up on that  Please send if that is the case Alert [place] if any problems   Then let us know in 3-4 weeks when ready for refill -if doing well we will increase dose again

## 2023-12-04 NOTE — Telephone Encounter (Signed)
 Last filled on 11/02/23 #2 mL/ 0 refill  Pt put a message on refill request (mychart), saying:   Doing well with this--A1C is lower and have lost about 10 pounds!  Let me know if I need to come in for a check with you.     Last OV was f/u on 11/02/23

## 2023-12-07 ENCOUNTER — Other Ambulatory Visit: Payer: Self-pay | Admitting: Family Medicine

## 2023-12-27 ENCOUNTER — Other Ambulatory Visit: Payer: Self-pay | Admitting: Family Medicine

## 2023-12-27 NOTE — Telephone Encounter (Signed)
 Spoke to pt, scheduled cpe for 02/09/24.

## 2023-12-27 NOTE — Telephone Encounter (Signed)
 Pt is due for CPE (labs prior) or or after 01/28/24, please schedule and then route back to me, thanks

## 2023-12-29 ENCOUNTER — Ambulatory Visit: Admitting: Family Medicine

## 2023-12-29 ENCOUNTER — Encounter: Payer: Self-pay | Admitting: Family Medicine

## 2023-12-29 VITALS — BP 122/78 | HR 90 | Temp 97.8°F | Ht 63.75 in | Wt 180.5 lb

## 2023-12-29 DIAGNOSIS — N3000 Acute cystitis without hematuria: Secondary | ICD-10-CM | POA: Diagnosis not present

## 2023-12-29 DIAGNOSIS — S76301A Unspecified injury of muscle, fascia and tendon of the posterior muscle group at thigh level, right thigh, initial encounter: Secondary | ICD-10-CM

## 2023-12-29 DIAGNOSIS — S76309A Unspecified injury of muscle, fascia and tendon of the posterior muscle group at thigh level, unspecified thigh, initial encounter: Secondary | ICD-10-CM | POA: Insufficient documentation

## 2023-12-29 DIAGNOSIS — R300 Dysuria: Secondary | ICD-10-CM

## 2023-12-29 DIAGNOSIS — Z7984 Long term (current) use of oral hypoglycemic drugs: Secondary | ICD-10-CM | POA: Diagnosis not present

## 2023-12-29 DIAGNOSIS — E119 Type 2 diabetes mellitus without complications: Secondary | ICD-10-CM | POA: Diagnosis not present

## 2023-12-29 LAB — POC URINALSYSI DIPSTICK (AUTOMATED)
Bilirubin, UA: NEGATIVE
Glucose, UA: NEGATIVE
Ketones, UA: NEGATIVE
Nitrite, UA: NEGATIVE
Protein, UA: NEGATIVE
Spec Grav, UA: 1.015 (ref 1.010–1.025)
Urobilinogen, UA: 0.2 U/dL
pH, UA: 6 (ref 5.0–8.0)

## 2023-12-29 MED ORDER — CEPHALEXIN 500 MG PO CAPS: 500.0000 mg | ORAL_CAPSULE | Freq: Two times a day (BID) | ORAL | 0 refills | Status: DC

## 2023-12-29 NOTE — Patient Instructions (Addendum)
 Continue tylenol  as needed  Start using voltaren topical over the counter on the painful area (hamstring)  Up to four times daily   Use heat  Avoid painful activities for next 1-2 weeks  Update if not starting to improve in a week or if worsening    For uti  Drink lots of water Take generic keflex as directed If symptoms worsen before culture returns let us  know  We will reach out with results    Hold glipizide  for now -this is most likely causing low glucose

## 2023-12-29 NOTE — Assessment & Plan Note (Signed)
 Voiding symptoms and urinalysis positive for wbc Encouraged water intake Keflex prescription bid 7d  Pending culture - will change treatment if needed Instructed to call if symptoms worsen before that Handout given  Update if not starting to improve in a week or if worsening  Call back and Er precautions noted in detail today

## 2023-12-29 NOTE — Assessment & Plan Note (Signed)
 Pain and tenderness of right hamstring Pain with palpation and also with flex of hip in leg extension  Some mild trochanteric tenderness but mainly in hamstring   Discussed use of ice/ heat  Rest leg - avoid painful activities/walking for 1-2 weeks Tylenol  prn/ voltaren gel prn  Update if not starting to improve in a week or if worsening  Consider PT or sport med visit if not improving   Call back and Er precautions noted in detail today

## 2023-12-29 NOTE — Progress Notes (Signed)
 Subjective:    Patient ID: Brooke Mitchell, female    DOB: 1951/04/27, 73 y.o.   MRN: [id number]  HPI  Wt Readings from Last 3 Encounters:  12/29/23 180 lb 8 oz (81.9 kg)  11/02/23 194 lb 6 oz (88.2 kg)  10/20/23 196 lb 8 oz (89.1 kg)   31.23 kg/m  Vitals:   12/29/23 1150  BP: 122/78  Pulse: 90  Temp: 97.8 F (36.6 C)  SpO2: 99%    Pt presents for urinary symptoms and hip/leg pain  DM/ mentions hypoglycemia recently    Urgent urination  Frequency  Pain to urinate  Bladder does not hurt   No blood in urine  No nausea  No fever   Urinalysis - with wbc   Results for orders placed or performed in visit on 12/29/23  POCT Urinalysis Dipstick (Automated)   Collection Time: 12/29/23 12:07 PM  Result Value Ref Range   Color, UA Yellow    Clarity, UA Hazy    Glucose, UA Negative Negative   Bilirubin, UA Negative    Ketones, UA Negative    Spec Grav, UA 1.015 1.010 - 1.025   Blood, UA Trace    pH, UA 6.0 5.0 - 8.0   Protein, UA Negative Negative   Urobilinogen, UA 0.2 0.2 or 1.0 E.U./dL   Nitrite, UA Negative    Leukocytes, UA Large (3+) (A) Negative     Right leg hurts  Back of thigh / just below buttock  ? If strained hamstring  Hurts to bear weight and press the gas pedal   No pain if not using it     Walking a lot  This bothers her  Some stretching  No falls  No trauma  No swelling or bruising   Heating pad helps  Lying down helps  Biofreeze helps    DM2 Lab Results  Component Value Date   HGBA1C 6.8 (A) 11/02/2023   HGBA1C 6.5 (A) 08/01/2023   HGBA1C 6.5 01/18/2023   Had some glucose in 60s last week  Metformin   Mounjaro 5 mg -has helped appetite and weight loss   Glipizide  xl 10 mg     Patient Active Problem List   Diagnosis Date Noted   Hamstring injury 12/29/2023   Influenza A 10/25/2023   Herpes zoster 09/26/2023   Rash and nonspecific skin eruption 09/26/2023   Osteopenia 08/22/2023   Skin cancer screening 01/27/2023    Encounter for screening mammogram for breast cancer 07/25/2022   Torn rotator cuff 03/18/2022   Medicare annual wellness visit, subsequent 01/24/2022   Cold hands and feet 10/08/2020   UTI (urinary tract infection) 02/20/2018   History of melanoma in situ 01/07/2018   Welcome to Medicare preventive visit 12/23/2016   Screening mammogram, encounter for 12/23/2016   Estrogen deficiency 12/23/2016   Low back pain 04/12/2016   Foot pain, left 12/23/2015   Urge incontinence 12/23/2015   Anemia, iron  deficiency 02/19/2015   Vitamin B 12 deficiency 12/05/2014   Fatigue 11/26/2014   Left shoulder pain 11/26/2014   Grief reaction 01/22/2014   Colon cancer screening 10/24/2012   Class 1 obesity with serious comorbidity and body mass index (BMI) of 34.0 to 34.9 in adult 10/19/2011   Routine general medical examination at a health care facility 04/07/2011   UTI'S, RECURRENT 02/03/2010   Hypothyroidism 11/24/2006   Diabetes mellitus treated with oral medication (HCC) 11/24/2006   Hyperlipidemia associated with type 2 diabetes mellitus (HCC) 11/24/2006   RESTLESS LEG  SYNDROME 11/24/2006   Hypertension associated with diabetes (HCC) 11/24/2006   PREMATURE VENTRICULAR CONTRACTIONS 11/24/2006   Hemorrhoids 11/24/2006   Allergic rhinitis 11/24/2006   Asthma in adult 11/24/2006   GERD 11/24/2006   Diaphragmatic hernia 11/24/2006   OVERACTIVE BLADDER 11/24/2006   DISC DISEASE, CERVICAL 11/24/2006   Sleep apnea 11/24/2006   Past Medical History:  Diagnosis Date   Allergic rhinitis    Allergy    Anxiety    Arthritis    osteoarthritis   Asthma    As a child    Cataract Mother--Donna   Depression    Diabetes mellitus    Type II (2/06 elevated microalbumin)   Frequent UTI    with coital prohylaxis    GERD (gastroesophageal reflux disease)    Glaucoma Mother--Donna   Hyperlipidemia    Hypertension    Hypothyroidism    IDA (iron  deficiency anemia)    Obesity    OSA (obstructive  sleep apnea)    CPAP   Sleep apnea    wears c-pap   Past Surgical History:  Procedure Laterality Date   BELPHAROPTOSIS REPAIR     COLONOSCOPY     DILATION AND CURETTAGE OF UTERUS     ENDOMETRIAL BIOPSY     FOOT SURGERY     UPPER GASTROINTESTINAL ENDOSCOPY     Social History   Tobacco Use   Smoking status: Never   Smokeless tobacco: Never  Vaping Use   Vaping status: Never Used  Substance Use Topics   Alcohol use: Not Currently    Comment: Occasional glass of wine or mixed drink   Drug use: Never   Family History  Problem Relation Age of Onset   Diabetes Mother    Coronary artery disease Mother    Hypothyroidism Mother    Irritable bowel syndrome Mother    Hyperlipidemia Mother    Hypertension Mother    Obesity Mother    Vision loss Mother    Alzheimer's disease Father    Nephrolithiasis Father    Arthritis Father    Varicose Veins Father    Hypothyroidism Brother    Breast cancer Paternal Grandmother 54   Colon cancer Neg Hx    Esophageal cancer Neg Hx    Rectal cancer Neg Hx    Stomach cancer Neg Hx    No Known Allergies Current Outpatient Medications on File Prior to Visit  Medication Sig Dispense Refill   acetaminophen  (TYLENOL ) 325 MG tablet Take 650 mg by mouth as needed for pain.     albuterol  (PROAIR  HFA) 108 (90 Base) MCG/ACT inhaler INHALE TWO PUFFS BY MOUTH UP TO EVERY 4 HOURS AS NEEDED FOR WHEEZING 8.5 g 0   Ascorbic Acid (VITAMIN C) 100 MG tablet Take 100 mg by mouth daily.     atorvastatin  (LIPITOR) 40 MG tablet Take 1 tablet (40 mg total) by mouth daily. 90 tablet 3   calcium  carbonate (OS-CAL) 600 MG TABS tablet Take 600 mg by mouth 2 (two) times daily with a meal.     Cholecalciferol 4000 UNITS CAPS Take 4,000 Units by mouth daily. Pt takes 2 tablets of 2,000 units per day = 4,000 units a day     Cyanocobalamin  (VITAMIN B-12 PO) Take 500 mcg by mouth daily.     fluticasone -salmeterol (ADVAIR) 100-50 MCG/ACT AEPB Inhale 1 puff into the lungs 2  (two) times daily as needed. 60 each 0   glucose blood (ACCU-CHEK GUIDE) test strip USE TO CHECK BLOOD SUGAR ONCE DAILY. Dx  E11.9 100 strip 3   hyoscyamine  (LEVSIN SL) 0.125 MG SL tablet Place one tablet under tongue once a day as needed. 30 tablet 1   IRON , FERROUS GLUCONATE, PO Take 1 tablet by mouth 2 (two) times daily.      levothyroxine  (SYNTHROID ) 75 MCG tablet TAKE 1 TABLET BY MOUTH DAILY BEFORE BREAKFAST 90 tablet 0   lisinopril -hydrochlorothiazide  (ZESTORETIC ) 10-12.5 MG tablet TAKE 1 TABLET BY MOUTH DAILY 90 tablet 1   loratadine (CLARITIN) 10 MG tablet Take 10 mg by mouth daily.     metFORMIN  (GLUCOPHAGE ) 1000 MG tablet TAKE 1 TABLET BY MOUTH TWICE A DAY WITH A MEAL 180 tablet 1   methocarbamol  (ROBAXIN ) 500 MG tablet Take 1 tablet (500 mg total) by mouth every 6 (six) hours as needed for muscle spasms. 60 tablet 3   multivitamin (THERAGRAN) per tablet Take 1 tablet by mouth daily.     tirzepatide (MOUNJARO) 5 MG/0.5ML Pen Inject 5 mg into the skin once a week. 2 mL 0   No current facility-administered medications on file prior to visit.    Review of Systems  Constitutional:  Positive for appetite change and fatigue. Negative for activity change and fever.       Dec appetite on monjaro   HENT:  Negative for congestion and sore throat.   Eyes:  Negative for itching and visual disturbance.  Respiratory:  Negative for cough and shortness of breath.   Cardiovascular:  Negative for leg swelling.  Gastrointestinal:  Negative for abdominal distention, abdominal pain, constipation, diarrhea and nausea.  Endocrine: Negative for cold intolerance and polydipsia.  Genitourinary:  Positive for dysuria, frequency and urgency. Negative for difficulty urinating, flank pain and hematuria.  Musculoskeletal:  Negative for myalgias.       Pain in back of right upper leg- tender hamstring / hurts with bearing weight and stretching    Skin:  Negative for rash.  Allergic/Immunologic: Negative for  immunocompromised state.  Neurological:  Negative for dizziness and weakness.  Hematological:  Negative for adenopathy.       Objective:   Physical Exam Constitutional:      General: She is not in acute distress.    Appearance: Normal appearance. She is well-developed. She is obese. She is not ill-appearing or diaphoretic.  HENT:     Head: Normocephalic and atraumatic.  Eyes:     Conjunctiva/sclera: Conjunctivae normal.     Pupils: Pupils are equal, round, and reactive to light.  Cardiovascular:     Rate and Rhythm: Normal rate and regular rhythm.     Heart sounds: Normal heart sounds.  Pulmonary:     Effort: Pulmonary effort is normal.     Breath sounds: Normal breath sounds.  Abdominal:     General: Bowel sounds are normal. There is no distension.     Palpations: Abdomen is soft.     Tenderness: There is abdominal tenderness. There is no guarding or rebound.     Comments: No cva tenderness  No suprapubic tenderness or fullness   Palpation gives her urge to urinate   Musculoskeletal:     Cervical back: Normal range of motion and neck supple.     Right hip: No crepitus. Normal range of motion.     Right upper leg: Tenderness present. No swelling, edema, deformity, lacerations or bony tenderness.     Right knee: No swelling or deformity. No tenderness.     Left knee: No swelling or deformity. No tenderness.     Comments:  RLE Tender over hamstring/worse proximally  No swelling /ecchymosis or palp cord  Pain worsens with flexion of hip (with knee extension) Normal rom of hip  Some mild tenderness over right greater trochanter   Lymphadenopathy:     Cervical: No cervical adenopathy.  Skin:    Findings: No rash.  Neurological:     Mental Status: She is alert.     Sensory: No sensory deficit.     Motor: No weakness.     Deep Tendon Reflexes: Reflexes normal.  Psychiatric:        Mood and Affect: Mood normal.           Assessment & Plan:   Problem List Items  Addressed This Visit       Endocrine   Diabetes mellitus treated with oral medication (HCC)   Lab Results  Component Value Date   HGBA1C 6.8 (A) 11/02/2023   HGBA1C 6.5 (A) 08/01/2023   HGBA1C 6.5 01/18/2023   Mounjaro 5 mg -working well and losing weight  Metformin  1000 mg bid  Glipizide  xl 10 mg daily - with lower recent glucose readings (some in 60s) instructed to hold it  Follow up soon for DM visit/annual         Musculoskeletal and Integument   Hamstring injury   Pain and tenderness of right hamstring Pain with palpation and also with flex of hip in leg extension  Some mild trochanteric tenderness but mainly in hamstring   Discussed use of ice/ heat  Rest leg - avoid painful activities/walking for 1-2 weeks Tylenol  prn/ voltaren gel prn  Update if not starting to improve in a week or if worsening  Consider PT or sport med visit if not improving   Call back and Er precautions noted in detail today          Genitourinary   UTI (urinary tract infection) - Primary   Voiding symptoms and urinalysis positive for wbc Encouraged water intake Keflex prescription bid 7d  Pending culture - will change treatment if needed Instructed to call if symptoms worsen before that Handout given  Update if not starting to improve in a week or if worsening  Call back and Er precautions noted in detail today        Relevant Medications   cephALEXin (KEFLEX) 500 MG capsule   Other Relevant Orders   Urine Culture   Other Visit Diagnoses       Dysuria       Relevant Orders   POCT Urinalysis Dipstick (Automated) (Completed)

## 2023-12-29 NOTE — Assessment & Plan Note (Signed)
 Lab Results  Component Value Date   HGBA1C 6.8 (A) 11/02/2023   HGBA1C 6.5 (A) 08/01/2023   HGBA1C 6.5 01/18/2023   Mounjaro 5 mg -working well and losing weight  Metformin  1000 mg bid  Glipizide  xl 10 mg daily - with lower recent glucose readings (some in 60s) instructed to hold it  Follow up soon for DM visit/annual

## 2023-12-31 ENCOUNTER — Encounter: Payer: Self-pay | Admitting: Family Medicine

## 2023-12-31 ENCOUNTER — Other Ambulatory Visit: Payer: Self-pay | Admitting: Family Medicine

## 2023-12-31 LAB — URINE CULTURE
MICRO NUMBER:: 16376620
SPECIMEN QUALITY:: ADEQUATE

## 2024-01-02 NOTE — Telephone Encounter (Signed)
 Last filled on 12/04/23 # 2 mL/ 0 refills   CPE scheduled 02/09/24

## 2024-01-10 DIAGNOSIS — G4733 Obstructive sleep apnea (adult) (pediatric): Secondary | ICD-10-CM | POA: Diagnosis not present

## 2024-01-22 ENCOUNTER — Telehealth: Payer: Self-pay | Admitting: Family Medicine

## 2024-01-22 DIAGNOSIS — E785 Hyperlipidemia, unspecified: Secondary | ICD-10-CM

## 2024-01-22 DIAGNOSIS — E1169 Type 2 diabetes mellitus with other specified complication: Secondary | ICD-10-CM

## 2024-01-22 DIAGNOSIS — E1159 Type 2 diabetes mellitus with other circulatory complications: Secondary | ICD-10-CM

## 2024-01-22 DIAGNOSIS — E039 Hypothyroidism, unspecified: Secondary | ICD-10-CM

## 2024-01-22 DIAGNOSIS — D509 Iron deficiency anemia, unspecified: Secondary | ICD-10-CM

## 2024-01-22 DIAGNOSIS — E538 Deficiency of other specified B group vitamins: Secondary | ICD-10-CM

## 2024-01-22 DIAGNOSIS — E559 Vitamin D deficiency, unspecified: Secondary | ICD-10-CM

## 2024-01-22 DIAGNOSIS — E119 Type 2 diabetes mellitus without complications: Secondary | ICD-10-CM

## 2024-01-22 NOTE — Telephone Encounter (Signed)
-----   Message from Gerry Krone sent at 01/19/2024  4:00 PM EDT ----- Regarding: Lab orders for, Fri, 5.30.25 Patient is scheduled for CPX labs, please order future labs, Thanks , Anselmo Kings

## 2024-01-29 ENCOUNTER — Other Ambulatory Visit: Payer: Self-pay | Admitting: Family Medicine

## 2024-02-02 ENCOUNTER — Other Ambulatory Visit

## 2024-02-02 ENCOUNTER — Ambulatory Visit: Payer: Self-pay | Admitting: Family Medicine

## 2024-02-02 DIAGNOSIS — I152 Hypertension secondary to endocrine disorders: Secondary | ICD-10-CM

## 2024-02-02 DIAGNOSIS — E538 Deficiency of other specified B group vitamins: Secondary | ICD-10-CM | POA: Diagnosis not present

## 2024-02-02 DIAGNOSIS — E1169 Type 2 diabetes mellitus with other specified complication: Secondary | ICD-10-CM

## 2024-02-02 DIAGNOSIS — Z7984 Long term (current) use of oral hypoglycemic drugs: Secondary | ICD-10-CM

## 2024-02-02 DIAGNOSIS — E1159 Type 2 diabetes mellitus with other circulatory complications: Secondary | ICD-10-CM

## 2024-02-02 DIAGNOSIS — E039 Hypothyroidism, unspecified: Secondary | ICD-10-CM

## 2024-02-02 DIAGNOSIS — E119 Type 2 diabetes mellitus without complications: Secondary | ICD-10-CM

## 2024-02-02 DIAGNOSIS — E559 Vitamin D deficiency, unspecified: Secondary | ICD-10-CM

## 2024-02-02 DIAGNOSIS — E785 Hyperlipidemia, unspecified: Secondary | ICD-10-CM | POA: Diagnosis not present

## 2024-02-02 DIAGNOSIS — D509 Iron deficiency anemia, unspecified: Secondary | ICD-10-CM

## 2024-02-02 LAB — CBC WITH DIFFERENTIAL/PLATELET
Basophils Absolute: 0.1 10*3/uL (ref 0.0–0.1)
Basophils Relative: 0.9 % (ref 0.0–3.0)
Eosinophils Absolute: 0.3 10*3/uL (ref 0.0–0.7)
Eosinophils Relative: 4.4 % (ref 0.0–5.0)
HCT: 37.3 % (ref 36.0–46.0)
Hemoglobin: 12.5 g/dL (ref 12.0–15.0)
Lymphocytes Relative: 25.7 % (ref 12.0–46.0)
Lymphs Abs: 1.6 10*3/uL (ref 0.7–4.0)
MCHC: 33.6 g/dL (ref 30.0–36.0)
MCV: 87.6 fl (ref 78.0–100.0)
Monocytes Absolute: 0.5 10*3/uL (ref 0.1–1.0)
Monocytes Relative: 7.4 % (ref 3.0–12.0)
Neutro Abs: 3.9 10*3/uL (ref 1.4–7.7)
Neutrophils Relative %: 61.6 % (ref 43.0–77.0)
Platelets: 288 10*3/uL (ref 150.0–400.0)
RBC: 4.26 Mil/uL (ref 3.87–5.11)
RDW: 13.8 % (ref 11.5–15.5)
WBC: 6.3 10*3/uL (ref 4.0–10.5)

## 2024-02-02 LAB — TSH: TSH: 1.88 u[IU]/mL (ref 0.35–5.50)

## 2024-02-02 LAB — COMPREHENSIVE METABOLIC PANEL WITH GFR
ALT: 7 U/L (ref 0–35)
AST: 13 U/L (ref 0–37)
Albumin: 4.1 g/dL (ref 3.5–5.2)
Alkaline Phosphatase: 41 U/L (ref 39–117)
BUN: 17 mg/dL (ref 6–23)
CO2: 30 meq/L (ref 19–32)
Calcium: 9.5 mg/dL (ref 8.4–10.5)
Chloride: 97 meq/L (ref 96–112)
Creatinine, Ser: 0.97 mg/dL (ref 0.40–1.20)
GFR: 58.18 mL/min — ABNORMAL LOW (ref >60.00)
Glucose, Bld: 155 mg/dL — ABNORMAL HIGH (ref 70–99)
Potassium: 3.5 meq/L (ref 3.5–5.1)
Sodium: 139 meq/L (ref 135–145)
Total Bilirubin: 0.8 mg/dL (ref 0.2–1.2)
Total Protein: 6.3 g/dL (ref 6.0–8.3)

## 2024-02-02 LAB — MICROALBUMIN / CREATININE URINE RATIO
Creatinine,U: 187.8 mg/dL
Microalb Creat Ratio: 5.3 mg/g (ref 0.0–30.0)
Microalb, Ur: 1 mg/dL (ref 0.0–1.9)

## 2024-02-02 LAB — LIPID PANEL
Cholesterol: 125 mg/dL (ref 0–200)
HDL: 40.3 mg/dL (ref >39.00)
LDL Cholesterol: 57 mg/dL (ref 0–99)
NonHDL: 85
Total CHOL/HDL Ratio: 3
Triglycerides: 142 mg/dL (ref 0.0–149.0)
VLDL: 28.4 mg/dL (ref 0.0–40.0)

## 2024-02-02 LAB — VITAMIN D 25 HYDROXY (VIT D DEFICIENCY, FRACTURES): VITD: 43.49 ng/mL (ref 30.00–100.00)

## 2024-02-02 LAB — HEMOGLOBIN A1C: Hgb A1c MFr Bld: 6.3 % (ref 4.6–6.5)

## 2024-02-02 LAB — FERRITIN: Ferritin: 165.7 ng/mL (ref 10.0–291.0)

## 2024-02-02 LAB — VITAMIN B12: Vitamin B-12: >1500 pg/mL — ABNORMAL HIGH (ref 211–911)

## 2024-02-02 LAB — IRON: Iron: 66 ug/dL (ref 42–145)

## 2024-02-09 ENCOUNTER — Ambulatory Visit: Admitting: Family Medicine

## 2024-02-09 ENCOUNTER — Encounter: Payer: Self-pay | Admitting: Family Medicine

## 2024-02-09 VITALS — BP 118/64 | HR 88 | Temp 98.5°F | Ht 63.5 in | Wt 169.4 lb

## 2024-02-09 DIAGNOSIS — E1169 Type 2 diabetes mellitus with other specified complication: Secondary | ICD-10-CM

## 2024-02-09 DIAGNOSIS — E039 Hypothyroidism, unspecified: Secondary | ICD-10-CM

## 2024-02-09 DIAGNOSIS — E538 Deficiency of other specified B group vitamins: Secondary | ICD-10-CM | POA: Diagnosis not present

## 2024-02-09 DIAGNOSIS — I152 Hypertension secondary to endocrine disorders: Secondary | ICD-10-CM

## 2024-02-09 DIAGNOSIS — M8580 Other specified disorders of bone density and structure, unspecified site: Secondary | ICD-10-CM

## 2024-02-09 DIAGNOSIS — E559 Vitamin D deficiency, unspecified: Secondary | ICD-10-CM | POA: Diagnosis not present

## 2024-02-09 DIAGNOSIS — Z0000 Encounter for general adult medical examination without abnormal findings: Secondary | ICD-10-CM | POA: Diagnosis not present

## 2024-02-09 DIAGNOSIS — E785 Hyperlipidemia, unspecified: Secondary | ICD-10-CM

## 2024-02-09 DIAGNOSIS — E1159 Type 2 diabetes mellitus with other circulatory complications: Secondary | ICD-10-CM

## 2024-02-09 DIAGNOSIS — D509 Iron deficiency anemia, unspecified: Secondary | ICD-10-CM | POA: Diagnosis not present

## 2024-02-09 DIAGNOSIS — E119 Type 2 diabetes mellitus without complications: Secondary | ICD-10-CM

## 2024-02-09 DIAGNOSIS — Z7984 Long term (current) use of oral hypoglycemic drugs: Secondary | ICD-10-CM

## 2024-02-09 NOTE — Patient Instructions (Addendum)
 Stay active/ walking or other  Add some strength training to your routine, this is important for bone and brain health and can reduce your risk of falls and help your body use insulin properly and regulate weight  Light weights, exercise bands , and internet videos are a good way to start  Yoga (chair or regular), machines , floor exercises or a gym with machines are also good options   Increase fluids to at least 60 oz daily - mostly water    Cut your b12 supplement in 1/2   Follow up in 6 months

## 2024-02-09 NOTE — Progress Notes (Signed)
 Subjective:    Patient ID: Brooke Mitchell, female    DOB: Jan 07, 1951, 73 y.o.   MRN: [id number]  HPI  Here for health maintenance exam and to review chronic medical problems   Wt Readings from Last 3 Encounters:  02/09/24 169 lb 6 oz (76.8 kg)  12/29/23 180 lb 8 oz (81.9 kg)  11/02/23 194 lb 6 oz (88.2 kg)   29.53 kg/m  Continues to loose weight  Is thrilled   Vitals:   02/09/24 0933  BP: 118/64  Pulse: 88  Temp: 98.5 F (36.9 C)  SpO2: 99%    Immunization History  Administered Date(s) Administered   Fluad Quad(high Dose 65+) 05/08/2019, 05/30/2020   H1N1 09/22/2008   Influenza Split 05/31/2012   Influenza Whole 06/05/2006, 06/29/2007, 07/09/2009, 08/04/2010   Influenza, High Dose Seasonal PF 06/10/2021, 05/02/2022, 05/09/2023   Influenza,inj,Quad PF,6+ Mos 06/19/2013, 06/25/2014, 06/16/2015, 06/14/2016, 06/14/2017, 05/25/2018   PFIZER(Purple Top)SARS-COV-2 Vaccination 09/25/2019, 10/16/2019, 06/17/2020   Pfizer Covid-19 Vaccine Bivalent Booster 78yrs & up 06/10/2021   Pfizer(Comirnaty)Fall Seasonal Vaccine 12 years and older 06/03/2022, 05/09/2023   Pneumococcal Conjugate-13 12/23/2016   Pneumococcal Polysaccharide-23 04/03/2009, 12/29/2017   Respiratory Syncytial Virus Vaccine,Recomb Aduvanted(Arexvy) 05/02/2022   Td 01/04/2004, 03/31/2010   Tdap 08/23/2016   Zoster Recombinant(Shingrix) 07/28/2017, 03/24/2018   Zoster, Live 04/29/2011    Health Maintenance Due  Topic Date Due   FOOT EXAM  07/26/2023     Mammogram 08/2023  Self breast exam- no lumps   Gyn health No problems    Colon cancer screening -colonoscopy 01/2015 with 10 y recall (age dependent)  Bone health  Dexa 08/2023 mild osteopenia  Falls- none  Fractures-none  Supplements -vit D  Last vitamin D  Lab Results  Component Value Date   VD25OH 43.49 02/02/2024    Exercise :  Walking Plans to start water aerobics twice weekly  Has a gym membership    Derm care  Is up to date  on visits   Mood    02/09/2024    9:36 AM 12/29/2023   12:08 PM 11/02/2023    9:09 AM 09/26/2023   12:43 PM 08/01/2023    8:49 AM  Depression screen PHQ 2/9  Decreased Interest 0 0 0 0 0  Down, Depressed, Hopeless 0 0 0 0 0  PHQ - 2 Score 0 0 0 0 0  Altered sleeping 0 0 0 0 0  Tired, decreased energy 0 0 0 0 1  Change in appetite 0 0 0 0 0  Feeling bad or failure about yourself  0 0 0 0 0  Trouble concentrating 0 0 0 0 0  Moving slowly or fidgety/restless 0 0 0 0 0  Suicidal thoughts 0 0 0 0 0  PHQ-9 Score 0 0 0 0 1  Difficult doing work/chores Not difficult at all Not difficult at all Not difficult at all Not difficult at all Not difficult at all   HTN bp is stable today  No cp or palpitations or headaches or edema  No side effects to medicines  BP Readings from Last 3 Encounters:  02/09/24 118/64  12/29/23 122/78  11/02/23 126/76    Lisinopril  hct 10-12.5 mg daily   Lab Results  Component Value Date   NA 139 02/02/2024   K 3.5 02/02/2024   CO2 30 02/02/2024   GLUCOSE 155 (H) 02/02/2024   BUN 17 02/02/2024   CREATININE 0.97 02/02/2024   CALCIUM  9.5 02/02/2024   GFR 58.18 (L) 02/02/2024   GFRNONAA  84.03 03/16/2010   Not drinking enough water    DM2 Lab Results  Component Value Date   HGBA1C 6.3 02/02/2024   HGBA1C 6.8 (A) 11/02/2023   HGBA1C 6.5 (A) 08/01/2023   Mounaro 5 mg weekly Metformin  1000 mg bid Glipizide  -no longer taking   Lab Results  Component Value Date   MICROALBUR 1.0 02/02/2024   MICROALBUR 2.5 (H) 01/18/2023        Hyperlipidemia Lab Results  Component Value Date   CHOL 125 02/02/2024   CHOL 167 08/01/2023   CHOL 165 03/14/2023   Lab Results  Component Value Date   HDL 40.30 02/02/2024   HDL 44.70 08/01/2023   HDL 42.70 03/14/2023   Lab Results  Component Value Date   LDLCALC 57 02/02/2024   LDLCALC 70 08/01/2023   LDLCALC 69 03/03/2021   Lab Results  Component Value Date   TRIG 142.0 02/02/2024   TRIG 261.0 (H)  08/01/2023   TRIG 289.0 (H) 03/14/2023   Lab Results  Component Value Date   CHOLHDL 3 02/02/2024   CHOLHDL 4 08/01/2023   CHOLHDL 4 03/14/2023   Lab Results  Component Value Date   LDLDIRECT 85.0 03/14/2023   LDLDIRECT 106.0 01/18/2023   LDLDIRECT 104.0 07/25/2022   Atorvastatin  40 mg daily   Hypothyroidism  Pt has no clinical changes No change in energy level/ hair or skin/ edema and no tremor Lab Results  Component Value Date   TSH 1.88 02/02/2024    Levothyroxine  75 mcg daily    Lab Results  Component Value Date   VITAMINB12 >1500 (H) 02/02/2024    Lab Results  Component Value Date   WBC 6.3 02/02/2024   HGB 12.5 02/02/2024   HCT 37.3 02/02/2024   MCV 87.6 02/02/2024   PLT 288.0 02/02/2024   Lab Results  Component Value Date   IRON  66 02/02/2024   TIBC 289 05/05/2017   FERRITIN 165.7 02/02/2024    Lab Results  Component Value Date   ALT 7 02/02/2024   AST 13 02/02/2024   ALKPHOS 41 02/02/2024   BILITOT 0.8 02/02/2024     Patient Active Problem List   Diagnosis Date Noted   Vitamin D  deficiency 01/22/2024   Hamstring injury 12/29/2023   Herpes zoster 09/26/2023   Osteopenia 08/22/2023   Skin cancer screening 01/27/2023   Encounter for screening mammogram for breast cancer 07/25/2022   Torn rotator cuff 03/18/2022   Medicare annual wellness visit, subsequent 01/24/2022   Cold hands and feet 10/08/2020   UTI (urinary tract infection) 02/20/2018   History of melanoma in situ 01/07/2018   Screening mammogram, encounter for 12/23/2016   Estrogen deficiency 12/23/2016   Low back pain 04/12/2016   Foot pain, left 12/23/2015   Urge incontinence 12/23/2015   Anemia, iron  deficiency 02/19/2015   Vitamin B 12 deficiency 12/05/2014   Fatigue 11/26/2014   Left shoulder pain 11/26/2014   Grief reaction 01/22/2014   Colon cancer screening 10/24/2012   Class 1 obesity with serious comorbidity and body mass index (BMI) of 34.0 to 34.9 in adult  10/19/2011   Routine general medical examination at a health care facility 04/07/2011   UTI'S, RECURRENT 02/03/2010   Hypothyroidism 11/24/2006   Diabetes mellitus treated with oral medication (HCC) 11/24/2006   Hyperlipidemia associated with type 2 diabetes mellitus (HCC) 11/24/2006   RESTLESS LEG SYNDROME 11/24/2006   Hypertension associated with diabetes (HCC) 11/24/2006   PREMATURE VENTRICULAR CONTRACTIONS 11/24/2006   Hemorrhoids 11/24/2006   Allergic rhinitis  11/24/2006   Asthma in adult 11/24/2006   GERD 11/24/2006   Diaphragmatic hernia 11/24/2006   OVERACTIVE BLADDER 11/24/2006   DISC DISEASE, CERVICAL 11/24/2006   Sleep apnea 11/24/2006   Past Medical History:  Diagnosis Date   Allergic rhinitis    Allergy    Anxiety    illness/death of husband--need to talk to Dr. Elva Hamburger about this   Arthritis    osteoarthritis   Asthma    As a child    Cataract Mother--Donna   Depression    husband with ALS/died 03-08-14   Diabetes mellitus    Type II (2/06 elevated microalbumin)   Frequent UTI    with coital prohylaxis    GERD (gastroesophageal reflux disease)    Glaucoma Mother--Donna   Hyperlipidemia    Hypertension    Hypothyroidism    IDA (iron  deficiency anemia)    Obesity    OSA (obstructive sleep apnea)    CPAP   Sleep apnea    wears c-pap   Past Surgical History:  Procedure Laterality Date   BELPHAROPTOSIS REPAIR     COLONOSCOPY     DILATION AND CURETTAGE OF UTERUS     ENDOMETRIAL BIOPSY     FOOT SURGERY     UPPER GASTROINTESTINAL ENDOSCOPY     Social History   Tobacco Use   Smoking status: Never   Smokeless tobacco: Never  Vaping Use   Vaping status: Never Used  Substance Use Topics   Alcohol use: Not Currently    Comment: Occasional glass of wine or mixed drink   Drug use: Never   Family History  Problem Relation Age of Onset   Diabetes Mother    Coronary artery disease Mother    Hypothyroidism Mother    Irritable bowel syndrome Mother     Hyperlipidemia Mother    Hypertension Mother    Obesity Mother    Vision loss Mother    Alzheimer's disease Father    Nephrolithiasis Father    Arthritis Father    Varicose Veins Father    Hypothyroidism Brother    Breast cancer Paternal Grandmother 20   Colon cancer Neg Hx    Esophageal cancer Neg Hx    Rectal cancer Neg Hx    Stomach cancer Neg Hx    No Known Allergies Current Outpatient Medications on File Prior to Visit  Medication Sig Dispense Refill   acetaminophen  (TYLENOL ) 325 MG tablet Take 650 mg by mouth as needed for pain.     albuterol  (PROAIR  HFA) 108 (90 Base) MCG/ACT inhaler INHALE TWO PUFFS BY MOUTH UP TO EVERY 4 HOURS AS NEEDED FOR WHEEZING 8.5 g 0   Ascorbic Acid (VITAMIN C) 100 MG tablet Take 100 mg by mouth daily.     atorvastatin  (LIPITOR) 40 MG tablet TAKE 1 TABLET BY MOUTH DAILY 90 tablet 0   calcium  carbonate (OS-CAL) 600 MG TABS tablet Take 600 mg by mouth 2 (two) times daily with a meal.     Cholecalciferol 4000 UNITS CAPS Take 4,000 Units by mouth daily. Pt takes 2 tablets of 2,000 units per day = 4,000 units a day     Cyanocobalamin  (VITAMIN B-12 PO) Take 500 mcg by mouth daily.     fluticasone -salmeterol (ADVAIR) 100-50 MCG/ACT AEPB Inhale 1 puff into the lungs 2 (two) times daily as needed. 60 each 0   glucose blood (ACCU-CHEK GUIDE) test strip USE TO CHECK BLOOD SUGAR ONCE DAILY. Dx E11.9 100 strip 3   hyoscyamine  (LEVSIN SL) 0.125  MG SL tablet Place one tablet under tongue once a day as needed. 30 tablet 1   IRON , FERROUS GLUCONATE, PO Take 1 tablet by mouth 2 (two) times daily.      levothyroxine  (SYNTHROID ) 75 MCG tablet TAKE 1 TABLET BY MOUTH DAILY BEFORE BREAKFAST 90 tablet 0   lisinopril -hydrochlorothiazide  (ZESTORETIC ) 10-12.5 MG tablet TAKE 1 TABLET BY MOUTH DAILY 90 tablet 1   loratadine (CLARITIN) 10 MG tablet Take 10 mg by mouth daily.     metFORMIN  (GLUCOPHAGE ) 1000 MG tablet TAKE 1 TABLET BY MOUTH TWICE A DAY WITH A MEAL 180 tablet 1    methocarbamol  (ROBAXIN ) 500 MG tablet Take 1 tablet (500 mg total) by mouth every 6 (six) hours as needed for muscle spasms. 60 tablet 3   multivitamin (THERAGRAN) per tablet Take 1 tablet by mouth daily.     tirzepatide (MOUNJARO) 5 MG/0.5ML Pen INJECT 5 MG UNDER THE SKIN ONCE WEEKLY 2 mL 1   No current facility-administered medications on file prior to visit.    Review of Systems  Constitutional:  Negative for activity change, appetite change, fatigue, fever and unexpected weight change.  HENT:  Negative for congestion, ear pain, rhinorrhea, sinus pressure and sore throat.   Eyes:  Negative for pain, redness and visual disturbance.  Respiratory:  Negative for cough, shortness of breath and wheezing.   Cardiovascular:  Negative for chest pain and palpitations.  Gastrointestinal:  Negative for abdominal pain, blood in stool, constipation and diarrhea.  Endocrine: Negative for polydipsia and polyuria.  Genitourinary:  Negative for dysuria, frequency and urgency.  Musculoskeletal:  Positive for arthralgias. Negative for back pain and myalgias.  Skin:  Negative for pallor and rash.  Allergic/Immunologic: Negative for environmental allergies.  Neurological:  Negative for dizziness, syncope and headaches.  Hematological:  Negative for adenopathy. Does not bruise/bleed easily.  Psychiatric/Behavioral:  Negative for decreased concentration and dysphoric mood. The patient is not nervous/anxious.        Objective:   Physical Exam Constitutional:      General: She is not in acute distress.    Appearance: Normal appearance. She is well-developed. She is obese. She is not ill-appearing or diaphoretic.  HENT:     Head: Normocephalic and atraumatic.     Right Ear: Tympanic membrane, ear canal and external ear normal.     Left Ear: Tympanic membrane, ear canal and external ear normal.     Nose: Nose normal. No congestion.     Mouth/Throat:     Mouth: Mucous membranes are moist.     Pharynx:  Oropharynx is clear. No posterior oropharyngeal erythema.  Eyes:     General: No scleral icterus.    Extraocular Movements: Extraocular movements intact.     Conjunctiva/sclera: Conjunctivae normal.     Pupils: Pupils are equal, round, and reactive to light.  Neck:     Thyroid : No thyromegaly.     Vascular: No carotid bruit or JVD.  Cardiovascular:     Rate and Rhythm: Normal rate and regular rhythm.     Pulses: Normal pulses.     Heart sounds: Normal heart sounds.     No gallop.  Pulmonary:     Effort: Pulmonary effort is normal. No respiratory distress.     Breath sounds: Normal breath sounds. No wheezing.     Comments: Good air exch Chest:     Chest wall: No tenderness.  Abdominal:     General: Bowel sounds are normal. There is no distension or abdominal  bruit.     Palpations: Abdomen is soft. There is no mass.     Tenderness: There is no abdominal tenderness.     Hernia: No hernia is present.  Genitourinary:    Comments: Breast exam: No mass, nodules, thickening, tenderness, bulging, retraction, inflamation, nipple discharge or skin changes noted.  No axillary or clavicular LA.     Musculoskeletal:        General: No tenderness. Normal range of motion.     Cervical back: Normal range of motion and neck supple. No rigidity. No muscular tenderness.     Right lower leg: No edema.     Left lower leg: No edema.     Comments: No kyphosis   Lymphadenopathy:     Cervical: No cervical adenopathy.  Skin:    General: Skin is warm and dry.     Coloration: Skin is not pale.     Findings: No erythema or rash.     Comments: Solar lentigines diffusely   Neurological:     Mental Status: She is alert. Mental status is at baseline.     Cranial Nerves: No cranial nerve deficit.     Motor: No abnormal muscle tone.     Coordination: Coordination normal.     Gait: Gait normal.     Deep Tendon Reflexes: Reflexes are normal and symmetric. Reflexes normal.  Psychiatric:        Mood and  Affect: Mood normal.        Cognition and Memory: Cognition and memory normal.           Assessment & Plan:   Problem List Items Addressed This Visit       Cardiovascular and Mediastinum   Hypertension associated with diabetes (HCC)   bp in fair control at this time  BP Readings from Last 1 Encounters:  02/09/24 118/64   No changes needed Most recent labs reviewed  Disc lifstyle change with low sodium diet and exercise  Lisinopril  hct 10-12.5 mg daily         Endocrine   Hypothyroidism   Hypothyroidism  Pt has no clinical changes No change in energy level/ hair or skin/ edema and no tremor Lab Results  Component Value Date   TSH 1.88 02/02/2024    Continues levothyroxine  75 mcg daily      Hyperlipidemia associated with type 2 diabetes mellitus (HCC)   Disc goals for lipids and reasons to control them Rev last labs with pt Rev low sat fat diet in detail  LDL at goal 57  Atorvastatin  40 mg daily        Diabetes mellitus treated with oral medication (HCC)   Lab Results  Component Value Date   HGBA1C 6.3 02/02/2024   HGBA1C 6.8 (A) 11/02/2023   HGBA1C 6.5 (A) 08/01/2023   Mounjaro 5 mg weekly  Metformin  1000 mg bid  Ok off glipizide   Microalb utd  Normal foot exam On ace On statin          Musculoskeletal and Integument   Osteopenia   Dexa 08/2023 mild osteopenia No falls or fracture  Discussed fall prevention, supplements and exercise for bone density  D level is normal  Discussed adding resistance training         Other   Vitamin D  deficiency   Last vitamin D  Lab Results  Component Value Date   VD25OH 43.49 02/02/2024   Vitamin D  level is therapeutic with current supplementation Disc importance of this to bone and overall  health       Vitamin B 12 deficiency   Lab Results  Component Value Date   VITAMINB12 >1500 (H) 02/02/2024   Will cut oral dose in 1/2       Routine general medical examination at a health care facility -  Primary   Reviewed health habits including diet and exercise and skin cancer prevention Reviewed appropriate screening tests for age  Also reviewed health mt list, fam hx and immunization status , as well as social and family history   See HPI Labs reviewed and ordered Health Maintenance  Topic Date Due   Complete foot exam   07/26/2023   COVID-19 Vaccine (7 - Pfizer risk 2024-25 season) 08/16/2024*   Flu Shot  04/05/2024   Medicare Annual Wellness Visit  04/24/2024   Hemoglobin A1C  08/04/2024   Eye exam for diabetics  11/06/2024   Colon Cancer Screening  01/18/2025   Yearly kidney function blood test for diabetes  02/01/2025   Yearly kidney health urinalysis for diabetes  02/01/2025   Mammogram  08/21/2025   DTaP/Tdap/Td vaccine (4 - Td or Tdap) 08/23/2026   Pneumonia Vaccine  Completed   DEXA scan (bone density measurement)  Completed   Hepatitis C Screening  Completed   Zoster (Shingles) Vaccine  Completed   HPV Vaccine  Aged Out   Meningitis B Vaccine  Aged Out  *Topic was postponed. The date shown is not the original due date.    Discussed fall prevention, supplements and exercise for bone density  Derm care is utd  PHQ 0       Anemia, iron  deficiency   Normal cbc today  Continues oral iron        Other Visit Diagnoses       Controlled type 2 diabetes mellitus without complication, without long-term current use of insulin (HCC)

## 2024-02-11 NOTE — Assessment & Plan Note (Signed)
 Hypothyroidism  Pt has no clinical changes No change in energy level/ hair or skin/ edema and no tremor Lab Results  Component Value Date   TSH 1.88 02/02/2024    Continues levothyroxine  75 mcg daily

## 2024-02-11 NOTE — Assessment & Plan Note (Signed)
 Disc goals for lipids and reasons to control them Rev last labs with pt Rev low sat fat diet in detail  LDL at goal 57  Atorvastatin  40 mg daily

## 2024-02-11 NOTE — Assessment & Plan Note (Signed)
 Reviewed health habits including diet and exercise and skin cancer prevention Reviewed appropriate screening tests for age  Also reviewed health mt list, fam hx and immunization status , as well as social and family history   See HPI Labs reviewed and ordered Health Maintenance  Topic Date Due   Complete foot exam   07/26/2023   COVID-19 Vaccine (7 - Pfizer risk 2024-25 season) 08/16/2024*   Flu Shot  04/05/2024   Medicare Annual Wellness Visit  04/24/2024   Hemoglobin A1C  08/04/2024   Eye exam for diabetics  11/06/2024   Colon Cancer Screening  01/18/2025   Yearly kidney function blood test for diabetes  02/01/2025   Yearly kidney health urinalysis for diabetes  02/01/2025   Mammogram  08/21/2025   DTaP/Tdap/Td vaccine (4 - Td or Tdap) 08/23/2026   Pneumonia Vaccine  Completed   DEXA scan (bone density measurement)  Completed   Hepatitis C Screening  Completed   Zoster (Shingles) Vaccine  Completed   HPV Vaccine  Aged Out   Meningitis B Vaccine  Aged Out  *Topic was postponed. The date shown is not the original due date.    Discussed fall prevention, supplements and exercise for bone density  Derm care is utd  PHQ 0

## 2024-02-11 NOTE — Assessment & Plan Note (Signed)
 Dexa 08/2023 mild osteopenia No falls or fracture  Discussed fall prevention, supplements and exercise for bone density  D level is normal  Discussed adding resistance training

## 2024-02-11 NOTE — Assessment & Plan Note (Signed)
 Lab Results  Component Value Date   HGBA1C 6.3 02/02/2024   HGBA1C 6.8 (A) 11/02/2023   HGBA1C 6.5 (A) 08/01/2023   Mounjaro 5 mg weekly  Metformin  1000 mg bid  Ok off glipizide   Microalb utd  Normal foot exam On ace On statin

## 2024-02-11 NOTE — Assessment & Plan Note (Signed)
 bp in fair control at this time  BP Readings from Last 1 Encounters:  02/09/24 118/64   No changes needed Most recent labs reviewed  Disc lifstyle change with low sodium diet and exercise  Lisinopril  hct 10-12.5 mg daily

## 2024-02-11 NOTE — Assessment & Plan Note (Signed)
 Last vitamin D  Lab Results  Component Value Date   VD25OH 43.49 02/02/2024   Vitamin D  level is therapeutic with current supplementation Disc importance of this to bone and overall health

## 2024-02-11 NOTE — Assessment & Plan Note (Signed)
 Normal cbc today  Continues oral iron 

## 2024-02-11 NOTE — Assessment & Plan Note (Signed)
 Lab Results  Component Value Date   VITAMINB12 >1500 (H) 02/02/2024   Will cut oral dose in 1/2

## 2024-02-13 NOTE — Progress Notes (Signed)
 Brooke Canny T. Lilas Diefendorf, MD, CAQ [facility] at [facility] [address] [place], [address]  Phone: [phone]  FAX: [phone]  WILLIETTE Mitchell - 73 y.o. female  MRN [id number]  Date of Birth: 04/29/1951  Date: 02/14/2024  PCP: Clemens Curt, MD  Referral: Clemens Curt, MD  Chief Complaint  Patient presents with   Hamstring Pull    X 1 month ago but not getting any better   Subjective:   Brooke Mitchell is a 73 y.o. very pleasant female patient with Body mass index is 29.55 kg/m. who presents with the following:  Brooke Mitchell is a very nice patient who I have seen a number of different times going back 10 years or more.  She presents with some ongoing pain in the posterior hamstring on the right.  She predominately has pain in the region of the ischial tuberosity.  She has pain just above and below this at the muscular insertions.  Pain down the R posterior leg all the way to the foot, but this is intermittent and highly variable. She does have pain deep in the posterior pelvis centrally, and she does not have pain in the caudal pelvis She does not have any low back pain  Was riding her recumbent bike - hurt right after she got off  Pain at the ischial tubersity More when active  Has used some voltaren gel  Piriformis and HS R  Review of Systems is noted in the HPI, as appropriate  Objective:   BP 108/60   Pulse 86   Temp 97.9 F (36.6 C) (Temporal)   Ht 5' 3.5 (1.613 m)   Wt 169 lb 8 oz (76.9 kg)   SpO2 95%   BMI 29.55 kg/m   GEN: No acute distress; alert,appropriate. PULM: Breathing comfortably in no respiratory distress PSYCH: Normally interactive.    HIP EXAM: SIDE: R ROM: Abduction, Flexion, Internal and External range of motion: Full Pain with terminal IROM and EROM: Minimal to none GTB: NT SLR: NEG Knees: No effusion FABER: NT REVERSE FABER: Induces tenderness Piriformis: Tender at direct  palpation Tender to palpation just caudal to the ischial tuberosity, as well Hamstring strength 4+/5 Str: flexion: 5/5 abduction: 5/5 adduction: 5/5 Strength testing non-tender    Laboratory and Imaging Data:  Assessment and Plan:     ICD-10-CM   1. Right leg pain  M79.604     2. Hamstring strain, right, initial encounter  [phone]      Patient has insertional pain at the ischial tuberosity above and below.  There is some impact to the proximal hamstring, most likely due to her activity with riding the bike.  She also has some pain just above this.  She also has some tenderness in piriformis, and variable pain in this area and some occasional pain down the leg.  Think she has a combination of some hamstring strain combined with piriformis syndrome and insertional tenderness at the ischial tuberosity.  I am going to have her do some range of motion and strengthening work, and I suspect that this will resolve on its own with conservative measures.  Medication Management during today's office visit: No orders of the defined types were placed in this encounter.  There are no discontinued medications.  Orders placed today for conditions managed today: No orders of the defined types were placed in this encounter.   Disposition: No follow-ups on file.  [employer] speech-to-text software was  used for transcription in this dictation.  Possible transcriptional errors can occur using Animal nutritionist.   Signed,  Brooke Mitchell. Elai Vanwyk, MD   Outpatient Encounter Medications as of 02/14/2024  Medication Sig   acetaminophen  (TYLENOL ) 325 MG tablet Take 650 mg by mouth as needed for pain.   albuterol  (PROAIR  HFA) 108 (90 Base) MCG/ACT inhaler INHALE TWO PUFFS BY MOUTH UP TO EVERY 4 HOURS AS NEEDED FOR WHEEZING   Ascorbic Acid (VITAMIN C) 100 MG tablet Take 100 mg by mouth daily.   atorvastatin  (LIPITOR) 40 MG tablet TAKE 1 TABLET BY MOUTH DAILY   calcium  carbonate (OS-CAL) 600  MG TABS tablet Take 600 mg by mouth 2 (two) times daily with a meal.   Cholecalciferol 4000 UNITS CAPS Take 4,000 Units by mouth daily. Pt takes 2 tablets of 2,000 units per day = 4,000 units a day   Cyanocobalamin  (VITAMIN B-12 PO) Take 500 mcg by mouth daily.   fluticasone -salmeterol (ADVAIR) 100-50 MCG/ACT AEPB Inhale 1 puff into the lungs 2 (two) times daily as needed.   glucose blood (ACCU-CHEK GUIDE) test strip USE TO CHECK BLOOD SUGAR ONCE DAILY. Dx E11.9   hyoscyamine  (LEVSIN  SL) 0.125 MG SL tablet Place one tablet under tongue once a day as needed.   IRON , FERROUS GLUCONATE, PO Take 1 tablet by mouth 2 (two) times daily.    levothyroxine  (SYNTHROID ) 75 MCG tablet TAKE 1 TABLET BY MOUTH DAILY BEFORE BREAKFAST   lisinopril -hydrochlorothiazide  (ZESTORETIC ) 10-12.5 MG tablet TAKE 1 TABLET BY MOUTH DAILY   loratadine (CLARITIN) 10 MG tablet Take 10 mg by mouth daily.   metFORMIN  (GLUCOPHAGE ) 1000 MG tablet TAKE 1 TABLET BY MOUTH TWICE A DAY WITH A MEAL   methocarbamol  (ROBAXIN ) 500 MG tablet Take 1 tablet (500 mg total) by mouth every 6 (six) hours as needed for muscle spasms.   multivitamin (THERAGRAN) per tablet Take 1 tablet by mouth daily.   tirzepatide  (MOUNJARO ) 5 MG/0.5ML Pen INJECT 5 MG UNDER THE SKIN ONCE WEEKLY   No facility-administered encounter medications on file as of 02/14/2024.

## 2024-02-14 ENCOUNTER — Ambulatory Visit: Admitting: Family Medicine

## 2024-02-14 ENCOUNTER — Encounter: Payer: Self-pay | Admitting: Family Medicine

## 2024-02-14 VITALS — BP 108/60 | HR 86 | Temp 97.9°F | Ht 63.5 in | Wt 169.5 lb

## 2024-02-14 DIAGNOSIS — S76311A Strain of muscle, fascia and tendon of the posterior muscle group at thigh level, right thigh, initial encounter: Secondary | ICD-10-CM

## 2024-02-14 DIAGNOSIS — M79604 Pain in right leg: Secondary | ICD-10-CM

## 2024-02-17 ENCOUNTER — Other Ambulatory Visit: Payer: Self-pay | Admitting: Family Medicine

## 2024-02-27 ENCOUNTER — Other Ambulatory Visit: Payer: Self-pay | Admitting: Family Medicine

## 2024-02-27 NOTE — Telephone Encounter (Signed)
 Last filled on 01/02/24 # 2 mL/ 1 refill  6 month f/u scheduled 08/09/24

## 2024-02-27 NOTE — Telephone Encounter (Signed)
 Do you want to to up on the dose or stay here?

## 2024-02-28 NOTE — Telephone Encounter (Signed)
 Pt said she is doing good on the 5 mg so she would like to stay at this dose for another month

## 2024-03-25 ENCOUNTER — Telehealth: Payer: Self-pay | Admitting: *Deleted

## 2024-03-25 ENCOUNTER — Other Ambulatory Visit: Payer: Self-pay | Admitting: Family Medicine

## 2024-03-25 MED ORDER — TIRZEPATIDE 7.5 MG/0.5ML ~~LOC~~ SOAJ
7.5000 mg | SUBCUTANEOUS | 0 refills | Status: DC
Start: 2024-03-25 — End: 2024-04-19

## 2024-03-25 NOTE — Telephone Encounter (Signed)
Pt notified Rx sent and advised of Dr. Tower's comments  

## 2024-03-25 NOTE — Telephone Encounter (Signed)
 Copied from CRM [id number]. Topic: Clinical - Medication Question >> Mar 25, 2024 12:16 PM Viola F wrote: Reason for CRM: patient wants to let Dr. Randeen know that she is ready to move up to the next dosage of the Tirzepatide  (MOUNJARO ) 7mg  or 7.5mg ? She would like it sent to [facility] - [place], [place] - [address] Patient would like a call back at  [phone] (M)

## 2024-03-25 NOTE — Telephone Encounter (Signed)
 I sent it  Let us  know if any intolerable side effects

## 2024-04-04 ENCOUNTER — Other Ambulatory Visit: Payer: Self-pay | Admitting: Family Medicine

## 2024-04-11 DIAGNOSIS — G4733 Obstructive sleep apnea (adult) (pediatric): Secondary | ICD-10-CM | POA: Diagnosis not present

## 2024-04-19 ENCOUNTER — Other Ambulatory Visit: Payer: Self-pay | Admitting: Family Medicine

## 2024-04-26 ENCOUNTER — Ambulatory Visit: Admitting: Family Medicine

## 2024-04-26 ENCOUNTER — Encounter: Payer: Self-pay | Admitting: Family Medicine

## 2024-04-26 VITALS — BP 122/60 | HR 83 | Temp 98.4°F | Ht 63.5 in | Wt 155.1 lb

## 2024-04-26 DIAGNOSIS — E119 Type 2 diabetes mellitus without complications: Secondary | ICD-10-CM

## 2024-04-26 DIAGNOSIS — Z7984 Long term (current) use of oral hypoglycemic drugs: Secondary | ICD-10-CM

## 2024-04-26 DIAGNOSIS — R300 Dysuria: Secondary | ICD-10-CM | POA: Diagnosis not present

## 2024-04-26 DIAGNOSIS — N3000 Acute cystitis without hematuria: Secondary | ICD-10-CM | POA: Diagnosis not present

## 2024-04-26 LAB — POC URINALSYSI DIPSTICK (AUTOMATED)
Spec Grav, UA: 1.015 (ref 1.010–1.025)
pH, UA: 5 (ref 5.0–8.0)

## 2024-04-26 LAB — POCT UA - MICROSCOPIC ONLY

## 2024-04-26 MED ORDER — CEPHALEXIN 500 MG PO CAPS: 500.0000 mg | ORAL_CAPSULE | Freq: Two times a day (BID) | ORAL | 0 refills | Status: DC

## 2024-04-26 NOTE — Patient Instructions (Signed)
 Drink lots of water  Take the keflex  as directed   Culture is pending  We will reach out with that when it returns  If symptoms worsen before then let us  know or go to ER

## 2024-04-26 NOTE — Assessment & Plan Note (Signed)
 Painful  urination/frequency and urgency  Helped by azo Positive urinalysis  Culture pending   Will treat with keflex  500 mg bid  Pend culture  Instructed to call if symptoms worsen   Call back and Er precautions noted in detail today    Will continue to push water  Given handout

## 2024-04-26 NOTE — Assessment & Plan Note (Signed)
 Has lost significant weight and close to goal with moujaro  On 7.5 mg weekly   Lab Results  Component Value Date   HGBA1C 6.3 02/02/2024   HGBA1C 6.8 (A) 11/02/2023   HGBA1C 6.5 (A) 08/01/2023   Will likely hold at this dose

## 2024-04-26 NOTE — Progress Notes (Signed)
 Subjective:    Patient ID: Brooke Mitchell, female    DOB: 1951/02/08, 73 y.o.   MRN: [id number]  HPI  Wt Readings from Last 3 Encounters:  04/26/24 155 lb 2 oz (70.4 kg)  02/14/24 169 lb 8 oz (76.9 kg)  02/09/24 169 lb 6 oz (76.8 kg)   27.05 kg/m  Vitals:   04/26/24 1115  BP: 122/60  Pulse: 83  Temp: 98.4 F (36.9 C)  SpO2: 100%   Pt presents with urinary symptoms  2 days Pain to urinate  Frequent and urgent urination  No leaking   No visible blood  Feels like bladder is full when it is not    Used some azo over the counter  It helps some   Feels tired / washed out   No fever No flank pain    Results for orders placed or performed in visit on 04/26/24  POCT Urinalysis Dipstick (Automated)   Collection Time: 04/26/24 11:29 AM  Result Value Ref Range   Color, UA Red    Clarity, UA Clear    Glucose, UA     Bilirubin, UA     Ketones, UA     Spec Grav, UA 1.015 1.010 - 1.025   Blood, UA     pH, UA 5.0 5.0 - 8.0   Protein, UA     Urobilinogen, UA     Nitrite, UA     Leukocytes, UA    POCT UA - Microscopic Only   Collection Time: 04/26/24 11:36 AM  Result Value Ref Range   WBC, Ur, HPF, POC 7-10 0 - 5   RBC, Urine, Miroscopic 1-3 0 - 2   Bacteria, U Microscopic many None - Trace   Mucus, UA few    Epithelial cells, urine per micros few    Crystals, Ur, HPF, POC none    Casts, Ur, LPF, POC none    Yeast, UA none       Patient Active Problem List   Diagnosis Date Noted   Vitamin D  deficiency 01/22/2024   Hamstring injury 12/29/2023   Herpes zoster 09/26/2023   Osteopenia 08/22/2023   Skin cancer screening 01/27/2023   Encounter for screening mammogram for breast cancer 07/25/2022   Torn rotator cuff 03/18/2022   Medicare annual wellness visit, subsequent 01/24/2022   Cold hands and feet 10/08/2020   UTI (urinary tract infection) 02/20/2018   History of melanoma in situ 01/07/2018   Screening mammogram, encounter for 12/23/2016    Estrogen deficiency 12/23/2016   Low back pain 04/12/2016   Foot pain, left 12/23/2015   Urge incontinence 12/23/2015   Anemia, iron  deficiency 02/19/2015   Vitamin B 12 deficiency 12/05/2014   Fatigue 11/26/2014   Left shoulder pain 11/26/2014   Grief reaction 01/22/2014   Colon cancer screening 10/24/2012   Routine general medical examination at a health care facility 04/07/2011   UTI'S, RECURRENT 02/03/2010   Hypothyroidism 11/24/2006   Diabetes mellitus treated with oral medication (HCC) 11/24/2006   Hyperlipidemia associated with type 2 diabetes mellitus (HCC) 11/24/2006   RESTLESS LEG SYNDROME 11/24/2006   Hypertension associated with diabetes (HCC) 11/24/2006   PREMATURE VENTRICULAR CONTRACTIONS 11/24/2006   Hemorrhoids 11/24/2006   Allergic rhinitis 11/24/2006   Asthma in adult 11/24/2006   GERD 11/24/2006   Diaphragmatic hernia 11/24/2006   OVERACTIVE BLADDER 11/24/2006   DISC DISEASE, CERVICAL 11/24/2006   Sleep apnea 11/24/2006   Past Medical History:  Diagnosis Date   Allergic rhinitis  Allergy    Anxiety    illness/death of husband--need to talk to Dr. ONEIDA about this   Arthritis    osteoarthritis   Asthma    As a child    Cataract Mother--Donna   Depression    husband with ALS/died 05/13/14   Diabetes mellitus    Type II (2/06 elevated microalbumin)   Frequent UTI    with coital prohylaxis    GERD (gastroesophageal reflux disease)    Glaucoma Mother--Donna   Hyperlipidemia    Hypertension    Hypothyroidism    IDA (iron  deficiency anemia)    Obesity    OSA (obstructive sleep apnea)    CPAP   Sleep apnea    wears c-pap   Past Surgical History:  Procedure Laterality Date   BELPHAROPTOSIS REPAIR     COLONOSCOPY     DILATION AND CURETTAGE OF UTERUS     ENDOMETRIAL BIOPSY     FOOT SURGERY     UPPER GASTROINTESTINAL ENDOSCOPY     Social History   Tobacco Use   Smoking status: Never   Smokeless tobacco: Never  Vaping Use   Vaping status: Never  Used  Substance Use Topics   Alcohol use: Not Currently    Comment: Occasional glass of wine or mixed drink   Drug use: Never   Family History  Problem Relation Age of Onset   Diabetes Mother    Coronary artery disease Mother    Hypothyroidism Mother    Irritable bowel syndrome Mother    Hyperlipidemia Mother    Hypertension Mother    Obesity Mother    Vision loss Mother    Alzheimer's disease Father    Nephrolithiasis Father    Arthritis Father    Varicose Veins Father    Hypothyroidism Brother    Breast cancer Paternal Grandmother 3   Colon cancer Neg Hx    Esophageal cancer Neg Hx    Rectal cancer Neg Hx    Stomach cancer Neg Hx    No Known Allergies Current Outpatient Medications on File Prior to Visit  Medication Sig Dispense Refill   acetaminophen  (TYLENOL ) 325 MG tablet Take 650 mg by mouth as needed for pain.     albuterol  (PROAIR  HFA) 108 (90 Base) MCG/ACT inhaler INHALE TWO PUFFS BY MOUTH UP TO EVERY 4 HOURS AS NEEDED FOR WHEEZING 8.5 g 0   Ascorbic Acid (VITAMIN C) 100 MG tablet Take 100 mg by mouth daily.     atorvastatin  (LIPITOR) 40 MG tablet TAKE 1 TABLET BY MOUTH DAILY 90 tablet 0   calcium  carbonate (OS-CAL) 600 MG TABS tablet Take 600 mg by mouth 2 (two) times daily with a meal.     Cholecalciferol 4000 UNITS CAPS Take 4,000 Units by mouth daily. Pt takes 2 tablets of 2,000 units per day = 4,000 units a day     Cyanocobalamin  (VITAMIN B-12 PO) Take 500 mcg by mouth daily.     fluticasone -salmeterol (ADVAIR) 100-50 MCG/ACT AEPB Inhale 1 puff into the lungs 2 (two) times daily as needed. 60 each 0   glucose blood (ACCU-CHEK GUIDE TEST) test strip USE TO CHECK BLOOD SUGAR DAILY 100 strip 1   hyoscyamine  (LEVSIN  SL) 0.125 MG SL tablet Place one tablet under tongue once a day as needed. 30 tablet 1   IRON , FERROUS GLUCONATE, PO Take 1 tablet by mouth 2 (two) times daily.      levothyroxine  (SYNTHROID ) 75 MCG tablet TAKE 1 TABLET BY MOUTH DAILY BEFORE BREAKFAST  90 tablet 2   lisinopril -hydrochlorothiazide  (ZESTORETIC ) 10-12.5 MG tablet TAKE 1 TABLET BY MOUTH DAILY 90 tablet 3   loratadine (CLARITIN) 10 MG tablet Take 10 mg by mouth daily.     metFORMIN  (GLUCOPHAGE ) 1000 MG tablet TAKE 1 TABLET BY MOUTH 2 TIMES A DAY WITH A MEAL 180 tablet 3   methocarbamol  (ROBAXIN ) 500 MG tablet Take 1 tablet (500 mg total) by mouth every 6 (six) hours as needed for muscle spasms. 60 tablet 3   multivitamin (THERAGRAN) per tablet Take 1 tablet by mouth daily.     tirzepatide  (MOUNJARO ) 7.5 MG/0.5ML Pen INJECT 7.5 MG UNDER THE SKIN ONCE WEEKLY 2 mL 0   No current facility-administered medications on file prior to visit.    Review of Systems  Constitutional:  Positive for fatigue. Negative for activity change, appetite change and fever.  HENT:  Negative for congestion and sore throat.   Eyes:  Negative for itching and visual disturbance.  Respiratory:  Negative for cough and shortness of breath.   Cardiovascular:  Negative for leg swelling.  Gastrointestinal:  Negative for abdominal distention, abdominal pain, constipation, diarrhea and nausea.  Endocrine: Negative for cold intolerance and polydipsia.  Genitourinary:  Positive for dysuria, frequency and urgency. Negative for difficulty urinating, flank pain and hematuria.  Musculoskeletal:  Negative for myalgias.  Skin:  Negative for rash.  Allergic/Immunologic: Negative for immunocompromised state.  Neurological:  Negative for dizziness and weakness.  Hematological:  Negative for adenopathy.       Objective:   Physical Exam Constitutional:      General: She is not in acute distress.    Appearance: She is well-developed. She is not ill-appearing or diaphoretic.  HENT:     Head: Normocephalic and atraumatic.  Eyes:     Conjunctiva/sclera: Conjunctivae normal.     Pupils: Pupils are equal, round, and reactive to light.  Cardiovascular:     Rate and Rhythm: Normal rate and regular rhythm.     Heart  sounds: Normal heart sounds.  Pulmonary:     Effort: Pulmonary effort is normal.     Breath sounds: Normal breath sounds.  Abdominal:     General: Bowel sounds are normal. There is no distension.     Palpations: Abdomen is soft.     Tenderness: There is abdominal tenderness. There is no rebound.     Comments: No cva tenderness  No suprapubic tenderness or fullness but pt feels urge to urinate when palpated   Musculoskeletal:     Cervical back: Normal range of motion and neck supple.  Lymphadenopathy:     Cervical: No cervical adenopathy.  Skin:    Findings: No rash.  Neurological:     Mental Status: She is alert.  Psychiatric:        Mood and Affect: Mood normal.           Assessment & Plan:   Problem List Items Addressed This Visit       Endocrine   Diabetes mellitus treated with oral medication (HCC)   Has lost significant weight and close to goal with moujaro  On 7.5 mg weekly   Lab Results  Component Value Date   HGBA1C 6.3 02/02/2024   HGBA1C 6.8 (A) 11/02/2023   HGBA1C 6.5 (A) 08/01/2023   Will likely hold at this dose         Genitourinary   UTI (urinary tract infection) - Primary   Painful  urination/frequency and urgency  Helped by azo Positive urinalysis  Culture pending   Will treat with keflex  500 mg bid  Pend culture  Instructed to call if symptoms worsen   Call back and Er precautions noted in detail today    Will continue to push water  Given handout       Relevant Medications   cephALEXin  (KEFLEX ) 500 MG capsule   Other Relevant Orders   POCT UA - Microscopic Only (Completed)   Urine Culture   Other Visit Diagnoses       Dysuria       Relevant Orders   POCT Urinalysis Dipstick (Automated) (Completed)

## 2024-04-28 LAB — URINE CULTURE
MICRO NUMBER:: 16870671
SPECIMEN QUALITY:: ADEQUATE

## 2024-04-29 ENCOUNTER — Ambulatory Visit: Payer: Self-pay | Admitting: Family Medicine

## 2024-04-29 ENCOUNTER — Other Ambulatory Visit: Payer: Self-pay | Admitting: Family Medicine

## 2024-05-02 ENCOUNTER — Ambulatory Visit: Payer: Medicare PPO

## 2024-05-02 VITALS — Ht 63.5 in | Wt 155.0 lb

## 2024-05-02 DIAGNOSIS — Z0000 Encounter for general adult medical examination without abnormal findings: Secondary | ICD-10-CM | POA: Diagnosis not present

## 2024-05-02 NOTE — Patient Instructions (Signed)
 Brooke Mitchell , Thank you for taking time out of your busy schedule to complete your Annual Wellness Visit with me. I enjoyed our conversation and look forward to speaking with you again next year. I, as well as your care team,  appreciate your ongoing commitment to your health goals. Please review the following plan we discussed and let me know if I can assist you in the future. Your Game plan/ To Do List    Referrals: If you haven't heard from the office you've been referred to, please reach out to them at the phone provided.   Follow up Visits: We will see or speak with you next year for your Next Medicare AWV with our clinical staff Have you seen your provider in the last 6 months (3 months if uncontrolled diabetes)? Yes  Clinician Recommendations:  Aim for 30 minutes of exercise or brisk walking, 6-8 glasses of water, and 5 servings of fruits and vegetables each day.       This is a list of the screenings recommended for you:  Health Maintenance  Topic Date Due   Complete foot exam   07/26/2023   Flu Shot  12/03/2024*   COVID-19 Vaccine (8 - 2024-25 season) 05/12/2026*   Hemoglobin A1C  08/04/2024   Eye exam for diabetics  11/06/2024   Colon Cancer Screening  01/18/2025   Yearly kidney function blood test for diabetes  02/01/2025   Yearly kidney health urinalysis for diabetes  02/01/2025   Medicare Annual Wellness Visit  05/02/2025   Mammogram  08/21/2025   DTaP/Tdap/Td vaccine (4 - Td or Tdap) 08/23/2026   Pneumococcal Vaccine for age over 67  Completed   DEXA scan (bone density measurement)  Completed   Hepatitis C Screening  Completed   Zoster (Shingles) Vaccine  Completed   HPV Vaccine  Aged Out   Meningitis B Vaccine  Aged Out  *Topic was postponed. The date shown is not the original due date.    Advanced directives: (In Chart) A copy of your advanced directives are scanned into your chart should your provider ever need it. Advance Care Planning is important because  it:  [x]  Makes sure you receive the medical care that is consistent with your values, goals, and preferences  [x]  It provides guidance to your family and loved ones and reduces their decisional burden about whether or not they are making the right decisions based on your wishes.  Follow the link provided in your after visit summary or read over the paperwork we have mailed to you to help you started getting your Advance Directives in place. If you need assistance in completing these, please reach out to us  so that we can help you!

## 2024-05-02 NOTE — Progress Notes (Signed)
 Subjective:   Brooke Mitchell is a 73 y.o. who presents for a Medicare Wellness preventive visit.  As a reminder, Annual Wellness Visits don't include a physical exam, and some assessments may be limited, especially if this visit is performed virtually. We may recommend an in-person follow-up visit with your provider if needed.  Visit Complete: Virtual I connected with  Vina VEAR Minerva on 05/02/24 by a audio enabled telemedicine application and verified that I am speaking with the correct person using two identifiers.  Patient Location: Home  Provider Location: Home Office  I discussed the limitations of evaluation and management by telemedicine. The patient expressed understanding and agreed to proceed.  Vital Signs: Because this visit was a virtual/telehealth visit, some criteria may be missing or patient reported. Any vitals not documented were not able to be obtained and vitals that have been documented are patient reported.  VideoDeclined- This patient declined Librarian, academic. Therefore the visit was completed with audio only.  Persons Participating in Visit: Patient.  AWV Questionnaire: Yes: Patient Medicare AWV questionnaire was completed by the patient on 04/28/24; I have confirmed that all information answered by patient is correct and no changes since this date.  Cardiac Risk Factors include: advanced age (>67men, >82 women);diabetes mellitus;dyslipidemia;hypertension     Objective:    Today's Vitals   04/28/24 1955 05/02/24 1133  Weight:  155 lb (70.3 kg)  Height:  5' 3.5 (1.613 m)  PainSc: 3     Body mass index is 27.03 kg/m.     05/02/2024   11:49 AM 04/25/2023   10:41 AM 01/13/2021   10:48 AM 01/13/2020   10:31 AM 09/01/2019    3:58 PM 01/04/2019   10:49 AM 04/10/2018    9:05 AM  Advanced Directives  Does Patient Have a Medical Advance Directive? Yes Yes Yes Yes Yes Yes Yes   Type of Estate agent of  [place];Living will Healthcare Power of [place];Living will Healthcare Power of [place];Living will Healthcare Power of [place];Living will Healthcare Power of [place];Living will Healthcare Power of [place];Living will Healthcare Power of [place];Living will  Does patient want to make changes to medical advance directive?  No - Patient declined    No - Patient declined  No - Patient declined   Copy of Healthcare Power of Attorney in Chart? Yes - validated most recent copy scanned in chart (See row information) Yes - validated most recent copy scanned in chart (See row information) Yes - validated most recent copy scanned in chart (See row information) No - copy requested  No - copy requested  No - copy requested      Data saved with a previous flowsheet row definition    Current Medications (verified) Outpatient Encounter Medications as of 05/02/2024  Medication Sig   acetaminophen  (TYLENOL ) 325 MG tablet Take 650 mg by mouth as needed for pain.   albuterol  (PROAIR  HFA) 108 (90 Base) MCG/ACT inhaler INHALE TWO PUFFS BY MOUTH UP TO EVERY 4 HOURS AS NEEDED FOR WHEEZING   Ascorbic Acid (VITAMIN C) 100 MG tablet Take 100 mg by mouth daily.   atorvastatin  (LIPITOR) 40 MG tablet TAKE 1 TABLET BY MOUTH DAILY   calcium  carbonate (OS-CAL) 600 MG TABS tablet Take 600 mg by mouth 2 (two) times daily with a meal.   Cholecalciferol 4000 UNITS CAPS Take 4,000 Units by mouth daily. Pt takes 2 tablets of 2,000 units per day = 4,000 units a day   Cyanocobalamin  (VITAMIN B-12  PO) Take 500 mcg by mouth daily.   fluticasone -salmeterol (ADVAIR) 100-50 MCG/ACT AEPB Inhale 1 puff into the lungs 2 (two) times daily as needed.   glucose blood (ACCU-CHEK GUIDE TEST) test strip USE TO CHECK BLOOD SUGAR DAILY   hyoscyamine  (LEVSIN  SL) 0.125 MG SL tablet Place one tablet under tongue once a day as needed.   IRON , FERROUS GLUCONATE, PO Take 1 tablet by mouth 2 (two) times daily.    levothyroxine  (SYNTHROID ) 75 MCG  tablet TAKE 1 TABLET BY MOUTH DAILY BEFORE BREAKFAST   lisinopril -hydrochlorothiazide  (ZESTORETIC ) 10-12.5 MG tablet TAKE 1 TABLET BY MOUTH DAILY   loratadine (CLARITIN) 10 MG tablet Take 10 mg by mouth daily.   metFORMIN  (GLUCOPHAGE ) 1000 MG tablet TAKE 1 TABLET BY MOUTH 2 TIMES A DAY WITH A MEAL   multivitamin (THERAGRAN) per tablet Take 1 tablet by mouth daily.   tirzepatide  (MOUNJARO ) 7.5 MG/0.5ML Pen INJECT 7.5 MG UNDER THE SKIN ONCE WEEKLY   cephALEXin  (KEFLEX ) 500 MG capsule Take 1 capsule (500 mg total) by mouth 2 (two) times daily. (Patient not taking: Reported on 05/02/2024)   methocarbamol  (ROBAXIN ) 500 MG tablet Take 1 tablet (500 mg total) by mouth every 6 (six) hours as needed for muscle spasms. (Patient not taking: Reported on 05/02/2024)   No facility-administered encounter medications on file as of 05/02/2024.    Allergies (verified) Patient has no known allergies.   History: Past Medical History:  Diagnosis Date   Allergic rhinitis    Allergy    Anxiety    illness/death of husband--need to talk to Dr. ONEIDA about this   Arthritis    osteoarthritis   Asthma    As a child    Cataract Mother--Donna   Depression    husband with ALS/died May 27, 2014   Diabetes mellitus    Type II (2/06 elevated microalbumin)   Frequent UTI    with coital prohylaxis    GERD (gastroesophageal reflux disease)    Glaucoma Mother--Donna   Hyperlipidemia    Hypertension    Hypothyroidism    IDA (iron  deficiency anemia)    Obesity    OSA (obstructive sleep apnea)    CPAP   Sleep apnea    wears c-pap   Past Surgical History:  Procedure Laterality Date   BELPHAROPTOSIS REPAIR     COLONOSCOPY     DILATION AND CURETTAGE OF UTERUS     ENDOMETRIAL BIOPSY     FOOT SURGERY     UPPER GASTROINTESTINAL ENDOSCOPY     Family History  Problem Relation Age of Onset   Diabetes Mother    Coronary artery disease Mother    Hypothyroidism Mother    Irritable bowel syndrome Mother    Hyperlipidemia  Mother    Hypertension Mother    Obesity Mother    Vision loss Mother    Alzheimer's disease Father    Nephrolithiasis Father    Arthritis Father    Varicose Veins Father    Hypothyroidism Brother    Breast cancer Paternal Grandmother 42   Colon cancer Neg Hx    Esophageal cancer Neg Hx    Rectal cancer Neg Hx    Stomach cancer Neg Hx    Social History   Socioeconomic History   Marital status: Widowed    Spouse name: Not on file   Number of children: 2   Years of education: Not on file   Highest education level: Master's degree (e.g., MA, MS, MEng, MEd, MSW, MBA)  Occupational History  Occupation: Magazine features editor: [employer]    Employer: RETIRED  Tobacco Use   Smoking status: Never   Smokeless tobacco: Never  Vaping Use   Vaping status: Never Used  Substance and Sexual Activity   Alcohol use: Not Currently    Comment: Occasional glass of wine or mixed drink   Drug use: Never   Sexual activity: Not Currently    Birth control/protection: Post-menopausal  Other Topics Concern   Not on file  Social History Narrative   Some walking for exercise   Widow 11/2013- Cared for husband full time with ALS at home    Social Drivers of Health   Financial Resource Strain: Low Risk  (05/02/2024)   Overall Financial Resource Strain (CARDIA)    Difficulty of Paying Living Expenses: Not very hard  Food Insecurity: No Food Insecurity (05/02/2024)   Hunger Vital Sign    Worried About Running Out of Food in the Last Year: Never true    Ran Out of Food in the Last Year: Never true  Transportation Needs: No Transportation Needs (05/02/2024)   PRAPARE - Administrator, Civil Service (Medical): No    Lack of Transportation (Non-Medical): No  Physical Activity: Insufficiently Active (05/02/2024)   Exercise Vital Sign    Days of Exercise per Week: 3 days    Minutes of Exercise per Session: 20 min  Stress: No Stress Concern Present (05/02/2024)   [employer] of Occupational Health - Occupational Stress Questionnaire    Feeling of Stress: Only a little  Social Connections: Moderately Integrated (05/02/2024)   Social Connection and Isolation Panel    Frequency of Communication with Friends and Family: More than three times a week    Frequency of Social Gatherings with Friends and Family: Three times a week    Attends Religious Services: More than 4 times per year    Active Member of Clubs or Organizations: Yes    Attends Banker Meetings: 1 to 4 times per year    Marital Status: Widowed    Tobacco Counseling Counseling given: Not Answered    Clinical Intake:  Pre-visit preparation completed: Yes  Pain : 0-10 Pain Score: 3  Pain Type: Acute pain Pain Location: Foot Pain Orientation: Left Pain Descriptors / Indicators: Sore, Aching, Dull Pain Onset: In the past 7 days Pain Frequency: Intermittent Pain Relieving Factors: heat,elevate foot  Pain Relieving Factors: heat,elevate foot  BMI - recorded: 27.03 Nutritional Status: BMI 25 -29 Overweight Nutritional Risks: None Diabetes: Yes CBG done?: Yes (pt bs 97 this am at home) CBG resulted in Enter/ Edit results?: No Did pt. bring in CBG monitor from home?: No  Lab Results  Component Value Date   HGBA1C 6.3 02/02/2024   HGBA1C 6.8 (A) 11/02/2023   HGBA1C 6.5 (A) 08/01/2023     How often do you need to have someone help you when you read instructions, pamphlets, or other written materials from your doctor or pharmacy?: 1 - Never  Interpreter Needed?: No  Comments: lives alone;condominium retired Financial planner entered by :: B.Alaiza Yau,LPN   Activities of Daily Living     04/28/2024    7:55 PM  In your present state of health, do you have any difficulty performing the following activities:  Hearing? 0  Vision? 0  Difficulty concentrating or making decisions? 0  Walking or climbing stairs? 0  Dressing or bathing? 0  Doing errands,  shopping? 0  Preparing Food and eating ? N  Using the Toilet? N  In the past six months, have you accidently leaked urine? N  Do you have problems with loss of bowel control? N  Managing your Medications? N  Managing your Finances? N  Housekeeping or managing your Housekeeping? N    Patient Care Team: Tower, Laine LABOR, MD as PCP - General Clance, Francis HERO, MD as Attending Physician (Pulmonary Disease) Mevelyn JONETTA Bathe, OD (Optometry)  I have updated your Care Teams any recent Medical Services you may have received from other providers in the past year.     Assessment:   This is a routine wellness examination for Alexis.  Hearing/Vision screen Hearing Screening - Comments:: Patient denies any hearing difficulties.   Vision Screening - Comments:: Pt says their vision is good with glasses Dr  Mevelyn   Goals Addressed             This Visit's Progress    COMPLETED: Patient Stated       Starting 01/04/19,  I will continue to walk at least 30 minutes 3-4 days per week.      Patient Stated       05/02/24- I will maintain and continue medications as prescribed.      COMPLETED: Patient Stated       01/13/2021, I will continue to walk my dog daily for about 20 minutes.     Patient Stated   On track    05/02/24-Continue to stay healthy and active        Depression Screen     05/02/2024   11:42 AM 04/26/2024   11:25 AM 02/09/2024    9:36 AM 12/29/2023   12:08 PM 11/02/2023    9:09 AM 09/26/2023   12:43 PM 08/01/2023    8:49 AM  PHQ 2/9 Scores  PHQ - 2 Score 0 0 0 0 0 0 0  PHQ- 9 Score  0 0 0 0 0 1    Fall Risk     04/28/2024    7:55 PM 04/26/2024   11:25 AM 02/09/2024    9:36 AM 12/29/2023   12:08 PM 11/02/2023    9:09 AM  Fall Risk   Falls in the past year? 0 0 0 0 1  Number falls in past yr: 0 0 0 0 0  Injury with Fall? 0 0 0 0 0  Risk for fall due to : No Fall Risks No Fall Risks No Fall Risks No Fall Risks No Fall Risks  Follow up Education provided;Falls prevention  discussed Falls evaluation completed Falls evaluation completed Falls evaluation completed Falls evaluation completed    MEDICARE RISK AT HOME:  Medicare Risk at Home Any stairs in or around the home?: (Patient-Rptd) No Home free of loose throw rugs in walkways, pet beds, electrical cords, etc?: (Patient-Rptd) Yes Adequate lighting in your home to reduce risk of falls?: (Patient-Rptd) Yes Life alert?: (Patient-Rptd) No Use of a cane, walker or w/c?: (Patient-Rptd) No Grab bars in the bathroom?: (Patient-Rptd) Yes Shower chair or bench in shower?: (Patient-Rptd) Yes Elevated toilet seat or a handicapped toilet?: (Patient-Rptd) Yes  TIMED UP AND GO:  Was the test performed?  No  Cognitive Function: 6CIT completed    01/13/2021   10:50 AM 01/13/2020   10:36 AM 01/04/2019    2:40 PM 12/29/2017    9:18 AM  MMSE - Mini Mental State Exam  Not completed: Unable to complete     Orientation to time  5 5 5   Orientation to Place  5 5 5   Registration  3 3 3   Attention/ Calculation  5 0 0  Recall  3 3 2   Recall-comments    unable to recall 1 of 3 words  Language- name 2 objects   0 0  Language- repeat  1 1 1   Language- follow 3 step command   0 3  Language- read & follow direction   0 0  Write a sentence   0 0  Copy design   0 0  Total score   17 19        05/02/2024   11:50 AM 04/25/2023   10:41 AM  6CIT Screen  What Year? 0 points 0 points  What month? 0 points 0 points  What time? 0 points 0 points  Count back from 20 0 points 0 points  Months in reverse 0 points 0 points  Repeat phrase 0 points 0 points  Total Score 0 points 0 points    Immunizations Immunization History  Administered Date(s) Administered   Fluad Quad(high Dose 65+) 05/08/2019, 05/30/2020   H1N1 09/22/2008   INFLUENZA, HIGH DOSE SEASONAL PF 06/10/2021, 05/02/2022, 05/09/2023   Influenza Split 05/31/2012   Influenza Whole 06/05/2006, 06/29/2007, 07/09/2009, 08/04/2010   Influenza,inj,Quad PF,6+ Mos  06/19/2013, 06/25/2014, 06/16/2015, 06/14/2016, 06/14/2017, 05/25/2018   PFIZER(Purple Top)SARS-COV-2 Vaccination 09/25/2019, 10/16/2019, 06/17/2020   Pfizer Covid-19 Vaccine Bivalent Booster 64yrs & up 06/10/2021   Pfizer(Comirnaty)Fall Seasonal Vaccine 12 years and older 06/03/2022, 05/09/2023, 11/24/2023   Pneumococcal Conjugate-13 12/23/2016   Pneumococcal Polysaccharide-23 04/03/2009, 12/29/2017   Respiratory Syncytial Virus Vaccine,Recomb Aduvanted(Arexvy) 05/02/2022   Td 01/04/2004, 03/31/2010   Tdap 08/23/2016   Zoster Recombinant(Shingrix) 07/28/2017, 03/24/2018   Zoster, Live 04/29/2011    Screening Tests Health Maintenance  Topic Date Due   FOOT EXAM  07/26/2023   INFLUENZA VACCINE  12/03/2024 (Originally 04/05/2024)   COVID-19 Vaccine (8 - 2024-25 season) 05/12/2026 (Originally 01/19/2024)   HEMOGLOBIN A1C  08/04/2024   OPHTHALMOLOGY EXAM  11/06/2024   Colonoscopy  01/18/2025   Diabetic kidney evaluation - eGFR measurement  02/01/2025   Diabetic kidney evaluation - Urine ACR  02/01/2025   Medicare Annual Wellness (AWV)  05/02/2025   MAMMOGRAM  08/21/2025   DTaP/Tdap/Td (4 - Td or Tdap) 08/23/2026   Pneumococcal Vaccine: 50+ Years  Completed   DEXA SCAN  Completed   Hepatitis C Screening  Completed   Zoster Vaccines- Shingrix  Completed   HPV VACCINES  Aged Out   Meningococcal B Vaccine  Aged Out    Health Maintenance  Health Maintenance Due  Topic Date Due   FOOT EXAM  07/26/2023   Health Maintenance Items Addressed: None at this time. the patient het foot exam in Dec 25 appt w/PCP  Additional Screening:  Vision Screening: Recommended annual ophthalmology exams for early detection of glaucoma and other disorders of the eye. Would you like a referral to an eye doctor? No    Dental Screening: Recommended annual dental exams for proper oral hygiene  Community Resource Referral / Chronic Care Management: CRR required this visit?  No   CCM required this  visit?  No   Plan:    I have personally reviewed and noted the following in the patient's chart:   Medical and social history Use of alcohol, tobacco or illicit drugs  Current medications and supplements including opioid prescriptions. Patient is not currently taking opioid prescriptions. Functional ability and status Nutritional status Physical activity Advanced directives List of other physicians Hospitalizations, surgeries, and ER visits  in previous 12 months Vitals Screenings to include cognitive, depression, and falls Referrals and appointments  In addition, I have reviewed and discussed with patient certain preventive protocols, quality metrics, and best practice recommendations. A written personalized care plan for preventive services as well as general preventive health recommendations were provided to patient.   Erminio LITTIE Saris, LPN   1/71/7974   After Visit Summary: (MyChart) Due to this being a telephonic visit, the after visit summary with patients personalized plan was offered to patient via MyChart   Notes: Nothing significant to report at this time.

## 2024-05-16 ENCOUNTER — Other Ambulatory Visit: Payer: Self-pay | Admitting: Family Medicine

## 2024-05-16 NOTE — Telephone Encounter (Signed)
 Last filled on 04/19/24 # 77mL/ 0 refills   6 month f/u scheduled 08/09/24

## 2024-07-05 ENCOUNTER — Encounter: Payer: Self-pay | Admitting: Family Medicine

## 2024-07-08 ENCOUNTER — Other Ambulatory Visit: Payer: Self-pay | Admitting: Family Medicine

## 2024-07-08 DIAGNOSIS — Z1231 Encounter for screening mammogram for malignant neoplasm of breast: Secondary | ICD-10-CM

## 2024-07-08 MED ORDER — TIRZEPATIDE 5 MG/0.5ML ~~LOC~~ SOAJ: 5.0000 mg | SUBCUTANEOUS | 2 refills | Status: AC

## 2024-07-11 DIAGNOSIS — G4733 Obstructive sleep apnea (adult) (pediatric): Secondary | ICD-10-CM | POA: Diagnosis not present

## 2024-07-24 ENCOUNTER — Ambulatory Visit: Admitting: Family Medicine

## 2024-07-24 ENCOUNTER — Encounter: Payer: Self-pay | Admitting: Family Medicine

## 2024-07-24 VITALS — BP 124/84 | HR 89 | Temp 98.0°F | Ht 63.5 in | Wt 142.5 lb

## 2024-07-24 DIAGNOSIS — D508 Other iron deficiency anemias: Secondary | ICD-10-CM

## 2024-07-24 DIAGNOSIS — Z7984 Long term (current) use of oral hypoglycemic drugs: Secondary | ICD-10-CM | POA: Diagnosis not present

## 2024-07-24 DIAGNOSIS — R55 Syncope and collapse: Secondary | ICD-10-CM | POA: Diagnosis not present

## 2024-07-24 DIAGNOSIS — E119 Type 2 diabetes mellitus without complications: Secondary | ICD-10-CM | POA: Diagnosis not present

## 2024-07-24 LAB — CBC WITH DIFFERENTIAL/PLATELET
Basophils Absolute: 0 K/uL (ref 0.0–0.1)
Basophils Relative: 0.3 % (ref 0.0–3.0)
Eosinophils Absolute: 0 K/uL (ref 0.0–0.7)
Eosinophils Relative: 0.5 % (ref 0.0–5.0)
HCT: 38.2 % (ref 36.0–46.0)
Hemoglobin: 13 g/dL (ref 12.0–15.0)
Lymphocytes Relative: 28.9 % (ref 12.0–46.0)
Lymphs Abs: 2 K/uL (ref 0.7–4.0)
MCHC: 33.9 g/dL (ref 30.0–36.0)
MCV: 89 fl (ref 78.0–100.0)
Monocytes Absolute: 0.4 K/uL (ref 0.1–1.0)
Monocytes Relative: 6.1 % (ref 3.0–12.0)
Neutro Abs: 4.4 K/uL (ref 1.4–7.7)
Neutrophils Relative %: 64.2 % (ref 43.0–77.0)
Platelets: 320 K/uL (ref 150.0–400.0)
RBC: 4.3 Mil/uL (ref 3.87–5.11)
RDW: 13.3 % (ref 11.5–15.5)
WBC: 6.8 K/uL (ref 4.0–10.5)

## 2024-07-24 LAB — BASIC METABOLIC PANEL WITH GFR
BUN: 15 mg/dL (ref 6–23)
CO2: 29 meq/L (ref 19–32)
Calcium: 9.5 mg/dL (ref 8.4–10.5)
Chloride: 99 meq/L (ref 96–112)
Creatinine, Ser: 0.86 mg/dL (ref 0.40–1.20)
GFR: 66.99 mL/min (ref >60.00)
Glucose, Bld: 110 mg/dL — ABNORMAL HIGH (ref 70–99)
Potassium: 3.4 meq/L — ABNORMAL LOW (ref 3.5–5.1)
Sodium: 138 meq/L (ref 135–145)

## 2024-07-24 LAB — HEPATIC FUNCTION PANEL
ALT: 8 U/L (ref 0–35)
AST: 13 U/L (ref 0–37)
Albumin: 4.2 g/dL (ref 3.5–5.2)
Alkaline Phosphatase: 33 U/L — ABNORMAL LOW (ref 39–117)
Bilirubin, Direct: 0.1 mg/dL (ref 0.0–0.3)
Total Bilirubin: 0.7 mg/dL (ref 0.2–1.2)
Total Protein: 6.7 g/dL (ref 6.0–8.3)

## 2024-07-24 NOTE — Progress Notes (Signed)
 "    Lenon Kuennen T. Tae Robak, MD, CAQ [facility] [address] [place], [address]  Phone: [phone]  FAX: [phone]  Brooke Mitchell - 73 y.o. female  MRN [id number]  Date of Birth: [birth year]  Date: 07/24/2024  PCP: Randeen Laine LABOR, MD  Referral: Randeen Laine LABOR, MD  Chief Complaint  Patient presents with   Near Syncope    Yesterday-Checked out by EMT   Subjective:   Brooke Mitchell is a 73 y.o. very pleasant female patient with Body mass index is 24.85 kg/m. who presents with the following:  Discussed the use of AI scribe software for clinical note transcription with the patient, who gave verbal consent to proceed.  History of Present Illness Brooke Mitchell is a 73 year old female with type 2 diabetes who presents with an episode of near syncope.  She experienced an episode of near syncope while playing bingo at a local rest home. She felt extremely hot and was noted by a friend to have lost color in her face. A cold drink helped her feel better. The episode occurred in a hot room, and she was standing for an extended period while assisting with the game.  She has been taking Mounjaro  since February without prior issues and is also on metformin  for her diabetes. She previously discontinued Januvia  and another diabetes medication. Her blood sugar levels have been stable, with a reading of 116 mg/dL after the incident and 92 mg/dL the following morning.  No nausea, vomiting, chest pain, shortness of breath, slurred speech, trouble walking, or weakness. She reported that the EMTs told her her oxygen level and pulse were normal during the incident.  No chest pain.  Review of Systems is noted in the HPI, as appropriate  Objective:   BP 124/84   Pulse 89   Temp 98 F (36.7 C) (Temporal)   Ht 5' 3.5 (1.613 m)   Wt 142 lb 8 oz (64.6 kg)   SpO2 99%   BMI 24.85 kg/m   Orthostatic VS for the past 24 hrs:  BP- Lying  Pulse- Lying BP- Sitting Pulse- Sitting BP- Standing at 0 minutes Pulse- Standing at 0 minutes  07/24/24 1145 116/73 84 116/73 88 114/73 100      GEN: No acute distress; alert,appropriate. CV: RRR, no m/g/r  PULM: Normal respiratory rate, no accessory muscle use. No wheezes, crackles or rhonchi  PSYCH: Normally interactive.   Laboratory and Imaging Data:  Assessment and Plan:     ICD-10-CM   1. Near syncope  R55 EKG 12-Lead    CBC with Differential/Platelet    Basic metabolic panel with GFR    Hepatic function panel    2. Diabetes mellitus treated with oral medication (HCC)  E11.9    Z79.84     3. Other iron  deficiency anemia  D50.8      Assessment & Plan Pre-Syncope Syncope likely due to overheating. No chest pain, shortness of breath, slurred speech, or weakness. Blood pressure normalized after episode. Normal oxygen and pulse levels. Anemia or kidney issues less likely. - Ordered EKG to check heart rhythm.  EKG: Normal sinus rhythm. Normal axis, normal R wave progression, No acute ST elevation or depression.   - Ordered basic blood work to assess blood count and kidney function.  Type 2 diabetes mellitus Well-managed with metformin  and tirzepatide . Blood sugar levels normal. No Januvia  since February. Weight loss noted. - I am doubtful this had  any impact to her presyncope  Medication Management during today's office visit: No orders of the defined types were placed in this encounter.  Medications Discontinued During This Encounter  Medication Reason   cephALEXin  (KEFLEX ) 500 MG capsule Completed Course   tirzepatide  (MOUNJARO ) 7.5 MG/0.5ML Pen Completed Course   cephALEXin  (KEFLEX ) 500 MG capsule Completed Course    Orders placed today for conditions managed today: Orders Placed This Encounter  Procedures   CBC with Differential/Platelet   Basic metabolic panel with GFR   Hepatic function panel   EKG 12-Lead    Disposition: No follow-ups on  file.  [employer] speech-to-text software was used for transcription in this dictation.  Possible transcriptional errors can occur using Animal nutritionist.   Signed,  Jacques DASEN. Lariza Cothron, MD   Outpatient Encounter Medications as of 07/24/2024  Medication Sig   acetaminophen  (TYLENOL ) 325 MG tablet Take 650 mg by mouth as needed for pain.   albuterol  (PROAIR  HFA) 108 (90 Base) MCG/ACT inhaler INHALE TWO PUFFS BY MOUTH UP TO EVERY 4 HOURS AS NEEDED FOR WHEEZING   Ascorbic Acid (VITAMIN C) 100 MG tablet Take 100 mg by mouth daily.   atorvastatin  (LIPITOR) 40 MG tablet TAKE 1 TABLET BY MOUTH DAILY   calcium  carbonate (OS-CAL) 600 MG TABS tablet Take 600 mg by mouth 2 (two) times daily with a meal.   Cholecalciferol 4000 UNITS CAPS Take 4,000 Units by mouth daily. Pt takes 2 tablets of 2,000 units per day = 4,000 units a day   Cyanocobalamin  (VITAMIN B-12 PO) Take 500 mcg by mouth daily.   fluticasone -salmeterol (ADVAIR) 100-50 MCG/ACT AEPB Inhale 1 puff into the lungs 2 (two) times daily as needed.   glucose blood (ACCU-CHEK GUIDE TEST) test strip USE TO CHECK BLOOD SUGAR DAILY   hyoscyamine  (LEVSIN  SL) 0.125 MG SL tablet Place one tablet under tongue once a day as needed.   IRON , FERROUS GLUCONATE, PO Take 1 tablet by mouth 2 (two) times daily.    levothyroxine  (SYNTHROID ) 75 MCG tablet TAKE 1 TABLET BY MOUTH DAILY BEFORE BREAKFAST   lisinopril -hydrochlorothiazide  (ZESTORETIC ) 10-12.5 MG tablet TAKE 1 TABLET BY MOUTH DAILY   loratadine (CLARITIN) 10 MG tablet Take 10 mg by mouth daily.   metFORMIN  (GLUCOPHAGE ) 1000 MG tablet TAKE 1 TABLET BY MOUTH 2 TIMES A DAY WITH A MEAL   methocarbamol  (ROBAXIN ) 500 MG tablet Take 1 tablet (500 mg total) by mouth every 6 (six) hours as needed for muscle spasms.   multivitamin (THERAGRAN) per tablet Take 1 tablet by mouth daily.   tirzepatide  (MOUNJARO ) 5 MG/0.5ML Pen Inject 5 mg into the skin once a week.   [DISCONTINUED] cephALEXin  (KEFLEX ) 500 MG  capsule Take 1 capsule (500 mg total) by mouth 2 (two) times daily. (Patient not taking: Reported on 05/02/2024)   [DISCONTINUED] tirzepatide  (MOUNJARO ) 7.5 MG/0.5ML Pen INJECT 7.5 MG UNDER THE SKIN ONCE WEEKLY   No facility-administered encounter medications on file as of 07/24/2024.   "

## 2024-07-25 ENCOUNTER — Ambulatory Visit: Payer: Self-pay | Admitting: Family Medicine

## 2024-08-05 ENCOUNTER — Other Ambulatory Visit: Payer: Self-pay | Admitting: Family Medicine

## 2024-08-09 ENCOUNTER — Ambulatory Visit: Admitting: Family Medicine

## 2024-08-12 ENCOUNTER — Encounter: Payer: Self-pay | Admitting: Family Medicine

## 2024-08-12 ENCOUNTER — Ambulatory Visit: Admitting: Family Medicine

## 2024-08-12 VITALS — BP 128/76 | HR 80 | Temp 97.8°F | Ht 63.5 in | Wt 139.2 lb

## 2024-08-12 DIAGNOSIS — E1159 Type 2 diabetes mellitus with other circulatory complications: Secondary | ICD-10-CM | POA: Diagnosis not present

## 2024-08-12 DIAGNOSIS — Z7985 Long-term (current) use of injectable non-insulin antidiabetic drugs: Secondary | ICD-10-CM

## 2024-08-12 DIAGNOSIS — E1169 Type 2 diabetes mellitus with other specified complication: Secondary | ICD-10-CM | POA: Diagnosis not present

## 2024-08-12 DIAGNOSIS — E119 Type 2 diabetes mellitus without complications: Secondary | ICD-10-CM

## 2024-08-12 DIAGNOSIS — I152 Hypertension secondary to endocrine disorders: Secondary | ICD-10-CM

## 2024-08-12 DIAGNOSIS — E785 Hyperlipidemia, unspecified: Secondary | ICD-10-CM | POA: Diagnosis not present

## 2024-08-12 DIAGNOSIS — Z7984 Long term (current) use of oral hypoglycemic drugs: Secondary | ICD-10-CM | POA: Diagnosis not present

## 2024-08-12 LAB — POCT GLYCOSYLATED HEMOGLOBIN (HGB A1C): Hemoglobin A1C: 5.5 % (ref 4.0–5.6)

## 2024-08-12 MED ORDER — TIRZEPATIDE 2.5 MG/0.5ML ~~LOC~~ SOAJ: 2.5000 mg | SUBCUTANEOUS | 5 refills | Status: AC

## 2024-08-12 NOTE — Assessment & Plan Note (Signed)
 bp in fair control at this time  BP Readings from Last 1 Encounters:  08/12/24 128/76   No changes needed Most recent labs reviewed  Disc lifstyle change with low sodium diet and exercise  Lisinopril  hct 10-12.5 mg daily

## 2024-08-12 NOTE — Patient Instructions (Addendum)
 Stay active  More strength training  Add some strength training to your routine, this is important for bone and brain health and can reduce your risk of falls and help your body use insulin properly and regulate weight  Light weights, exercise bands , and internet videos are a good way to start  Yoga (chair or regular), machines , floor exercises or a gym with machines are also good options   Optimally 3 times per week  We want to to avoid loss of muscle from your medication     The following are examples of protein in diet  Meat  Fish  Eggs  Dairy products  Soy products  Oat milk  Almond milk Nuts and nut butters  Legumes  Dried beans   Go down to 2.5 on mounjaro  after you finish the 5 Let us  know if you have any problems

## 2024-08-12 NOTE — Assessment & Plan Note (Signed)
 Disc goals for lipids and reasons to control them Rev last labs with pt Rev low sat fat diet in detail  LDL at goal 57  Atorvastatin  40 mg daily

## 2024-08-12 NOTE — Assessment & Plan Note (Signed)
 Lab Results  Component Value Date   HGBA1C 5.5 08/12/2024   HGBA1C 6.3 02/02/2024   HGBA1C 6.8 (A) 11/02/2023   Lab Results  Component Value Date   MICROALBUR 1.0 02/02/2024   MICROALBUR 4.6 (H) 03/16/2010     Doing well  Mounjaro  5 mg weekly / is at goal weight and wants to decrease dose to 2.5 mg weekly when done with the 5 (will update) -given written prescription to fill when it is time Metforin 1000 mg bid tolerates well Good diet Encouraged to add more strength building exercise   Normal foot exam today [facility] On ace and statin

## 2024-08-12 NOTE — Progress Notes (Signed)
 Subjective:    Patient ID: Brooke Mitchell, female    DOB: [birth year], 73 y.o.   MRN: [id number]  HPI  Wt Readings from Last 3 Encounters:  08/12/24 139 lb 4 oz (63.2 kg)  07/24/24 142 lb 8 oz (64.6 kg)  05/02/24 155 lb (70.3 kg)   24.28 kg/m  Vitals:   08/12/24 1055  BP: 128/76  Pulse: 80  Temp: 97.8 F (36.6 C)  SpO2: 100%    Pt presents for follow up of chronic health problems including  HTN DM2 Hyperlipidemia   HTN bp is stable today  No cp or palpitations or headaches or edema  No side effects to medicines  BP Readings from Last 3 Encounters:  08/12/24 128/76  07/24/24 124/84  04/26/24 122/60    Lisinopril  hct 10-12.5 mg daily    Lab Results  Component Value Date   NA 138 07/24/2024   K 3.4 (L) 07/24/2024   CO2 29 07/24/2024   GLUCOSE 110 (H) 07/24/2024   BUN 15 07/24/2024   CREATININE 0.86 07/24/2024   CALCIUM  9.5 07/24/2024   GFR 66.99 07/24/2024   GFRNONAA 84.03 03/16/2010   DM2  Home sugar results -in am usually runs in one teens  No lows at all    DM diet is good  Eating well and healthy much of the time  Still eats something small if not hungry  Good with protein    Exercise  Wants to do better   A1c is 5.5 today  Lab Results  Component Value Date   HGBA1C 5.5 08/12/2024   HGBA1C 6.3 02/02/2024   HGBA1C 6.8 (A) 11/02/2023   Lab Results  Component Value Date   MICROALBUR 1.0 02/02/2024   MICROALBUR 4.6 (H) 03/16/2010   Mounjaro  5 mg weekly Interested in going down to 2.5 mg  Metformin  1000 mg bid   Renal protection Last eye exam - march/has one scheduled   Hyperlipidemia Lab Results  Component Value Date   CHOL 125 02/02/2024   HDL 40.30 02/02/2024   LDLCALC 57 02/02/2024   LDLDIRECT 85.0 03/14/2023   TRIG 142.0 02/02/2024   CHOLHDL 3 02/02/2024   Atorvastatin  40 mg daily    Patient Active Problem List   Diagnosis Date Noted   Vitamin D  deficiency 01/22/2024   Herpes zoster 09/26/2023   Osteopenia  08/22/2023   Skin cancer screening 01/27/2023   Encounter for screening mammogram for breast cancer 07/25/2022   Torn rotator cuff 03/18/2022   Medicare annual wellness visit, subsequent 01/24/2022   History of melanoma in situ 01/07/2018   Screening mammogram, encounter for 12/23/2016   Estrogen deficiency 12/23/2016   Urge incontinence 12/23/2015   Anemia, iron  deficiency 02/19/2015   Vitamin B 12 deficiency 12/05/2014   Fatigue 11/26/2014   Grief reaction 01/22/2014   Colon cancer screening 10/24/2012   Routine general medical examination at a health care facility 04/07/2011   UTI'S, RECURRENT 02/03/2010   Hypothyroidism 11/24/2006   Diabetes mellitus treated with injections of non-insulin medication (HCC) 11/24/2006   Hyperlipidemia associated with type 2 diabetes mellitus (HCC) 11/24/2006   RESTLESS LEG SYNDROME 11/24/2006   Hypertension associated with diabetes (HCC) 11/24/2006   PREMATURE VENTRICULAR CONTRACTIONS 11/24/2006   Hemorrhoids 11/24/2006   Allergic rhinitis 11/24/2006   Asthma in adult 11/24/2006   GERD 11/24/2006   Diaphragmatic hernia 11/24/2006   OVERACTIVE BLADDER 11/24/2006   DISC DISEASE, CERVICAL 11/24/2006   Sleep apnea 11/24/2006   Past Medical History:  Diagnosis Date  Allergic rhinitis    Allergy    Anxiety    illness/death of husband--need to talk to Dr. ONEIDA about this   Arthritis    osteoarthritis   Asthma    As a child    Cataract Mother--Donna   Depression    husband with ALS/died 2014-09-06   Diabetes mellitus    Type II (2/06 elevated microalbumin)   Frequent UTI    with coital prohylaxis    GERD (gastroesophageal reflux disease)    Glaucoma Mother--Donna   Hyperlipidemia    Hypertension    Hypothyroidism    IDA (iron  deficiency anemia)    Obesity    OSA (obstructive sleep apnea)    CPAP   Sleep apnea    wears c-pap   Past Surgical History:  Procedure Laterality Date   BELPHAROPTOSIS REPAIR     COLONOSCOPY     DILATION AND  CURETTAGE OF UTERUS     ENDOMETRIAL BIOPSY     FOOT SURGERY     UPPER GASTROINTESTINAL ENDOSCOPY     Social History   Tobacco Use   Smoking status: Never   Smokeless tobacco: Never  Vaping Use   Vaping status: Never Used  Substance Use Topics   Alcohol use: Not Currently    Comment: Occasional glass of wine or mixed drink   Drug use: Never   Family History  Problem Relation Age of Onset   Diabetes Mother    Coronary artery disease Mother    Hypothyroidism Mother    Irritable bowel syndrome Mother    Hyperlipidemia Mother    Hypertension Mother    Obesity Mother    Vision loss Mother    Alzheimer's disease Father    Nephrolithiasis Father    Arthritis Father    Varicose Veins Father    Hypothyroidism Brother    Breast cancer Paternal Grandmother 35   Colon cancer Neg Hx    Esophageal cancer Neg Hx    Rectal cancer Neg Hx    Stomach cancer Neg Hx    No Known Allergies Current Outpatient Medications on File Prior to Visit  Medication Sig Dispense Refill   ACCU-CHEK GUIDE TEST test strip USE 1 STRIP TO TEST BLOOD SUGAR DAILY 100 strip 1   acetaminophen  (TYLENOL ) 325 MG tablet Take 650 mg by mouth as needed for pain.     albuterol  (PROAIR  HFA) 108 (90 Base) MCG/ACT inhaler INHALE TWO PUFFS BY MOUTH UP TO EVERY 4 HOURS AS NEEDED FOR WHEEZING 8.5 g 0   Ascorbic Acid (VITAMIN C) 100 MG tablet Take 100 mg by mouth daily.     atorvastatin  (LIPITOR) 40 MG tablet TAKE 1 TABLET BY MOUTH DAILY 90 tablet 1   calcium  carbonate (OS-CAL) 600 MG TABS tablet Take 600 mg by mouth 2 (two) times daily with a meal.     Cholecalciferol 4000 UNITS CAPS Take 4,000 Units by mouth daily. Pt takes 2 tablets of 2,000 units per day = 4,000 units a day     Cyanocobalamin  (VITAMIN B-12 PO) Take 500 mcg by mouth daily.     fluticasone -salmeterol (ADVAIR) 100-50 MCG/ACT AEPB Inhale 1 puff into the lungs 2 (two) times daily as needed. 60 each 0   hyoscyamine  (LEVSIN  SL) 0.125 MG SL tablet Place one  tablet under tongue once a day as needed. 30 tablet 1   IRON , FERROUS GLUCONATE, PO Take 1 tablet by mouth 2 (two) times daily.      levothyroxine  (SYNTHROID ) 75 MCG tablet TAKE 1  TABLET BY MOUTH DAILY BEFORE BREAKFAST 90 tablet 2   lisinopril -hydrochlorothiazide  (ZESTORETIC ) 10-12.5 MG tablet TAKE 1 TABLET BY MOUTH DAILY 90 tablet 3   loratadine (CLARITIN) 10 MG tablet Take 10 mg by mouth daily.     metFORMIN  (GLUCOPHAGE ) 1000 MG tablet TAKE 1 TABLET BY MOUTH 2 TIMES A DAY WITH A MEAL 180 tablet 3   methocarbamol  (ROBAXIN ) 500 MG tablet Take 1 tablet (500 mg total) by mouth every 6 (six) hours as needed for muscle spasms. 60 tablet 3   multivitamin (THERAGRAN) per tablet Take 1 tablet by mouth daily.     tirzepatide  (MOUNJARO ) 5 MG/0.5ML Pen Inject 5 mg into the skin once a week. 2 mL 2   No current facility-administered medications on file prior to visit.    Review of Systems  Constitutional:  Negative for activity change, appetite change, fatigue, fever and unexpected weight change.  HENT:  Negative for congestion, ear pain, rhinorrhea, sinus pressure and sore throat.   Eyes:  Negative for pain, redness and visual disturbance.  Respiratory:  Negative for cough, shortness of breath and wheezing.   Cardiovascular:  Negative for chest pain and palpitations.  Gastrointestinal:  Negative for abdominal pain, blood in stool, constipation and diarrhea.  Endocrine: Negative for polydipsia and polyuria.  Genitourinary:  Negative for dysuria, frequency and urgency.  Musculoskeletal:  Negative for arthralgias, back pain and myalgias.  Skin:  Negative for pallor and rash.  Allergic/Immunologic: Negative for environmental allergies.  Neurological:  Negative for dizziness, syncope and headaches.  Hematological:  Negative for adenopathy. Does not bruise/bleed easily.  Psychiatric/Behavioral:  Negative for decreased concentration and dysphoric mood. The patient is not nervous/anxious.         Objective:   Physical Exam Constitutional:      General: She is not in acute distress.    Appearance: Normal appearance. She is well-developed and normal weight. She is not ill-appearing or diaphoretic.  HENT:     Head: Normocephalic and atraumatic.  Eyes:     Conjunctiva/sclera: Conjunctivae normal.     Pupils: Pupils are equal, round, and reactive to light.  Neck:     Thyroid : No thyromegaly.     Vascular: No carotid bruit or JVD.  Cardiovascular:     Rate and Rhythm: Normal rate and regular rhythm.     Heart sounds: Normal heart sounds.     No gallop.  Pulmonary:     Effort: Pulmonary effort is normal. No respiratory distress.     Breath sounds: Normal breath sounds. No wheezing or rales.  Abdominal:     General: There is no distension or abdominal bruit.     Palpations: Abdomen is soft.  Musculoskeletal:     Cervical back: Normal range of motion and neck supple.     Right lower leg: No edema.     Left lower leg: No edema.  Lymphadenopathy:     Cervical: No cervical adenopathy.  Skin:    General: Skin is warm and dry.     Coloration: Skin is not pale.     Findings: No rash.  Neurological:     Mental Status: She is alert.     Coordination: Coordination normal.     Deep Tendon Reflexes: Reflexes are normal and symmetric. Reflexes normal.  Psychiatric:        Mood and Affect: Mood normal.           Assessment & Plan:   Problem List Items Addressed This Visit  Cardiovascular and Mediastinum   Hypertension associated with diabetes (HCC)   bp in fair control at this time  BP Readings from Last 1 Encounters:  08/12/24 128/76   No changes needed Most recent labs reviewed  Disc lifstyle change with low sodium diet and exercise  Lisinopril  hct 10-12.5 mg daily       Relevant Medications   tirzepatide  (MOUNJARO ) 2.5 MG/0.5ML Pen     Endocrine   Hyperlipidemia associated with type 2 diabetes mellitus (HCC)   Disc goals for lipids and reasons to  control them Rev last labs with pt Rev low sat fat diet in detail  LDL at goal 57  Atorvastatin  40 mg daily         Relevant Medications   tirzepatide  (MOUNJARO ) 2.5 MG/0.5ML Pen   Diabetes mellitus treated with injections of non-insulin medication (HCC) - Primary   Lab Results  Component Value Date   HGBA1C 5.5 08/12/2024   HGBA1C 6.3 02/02/2024   HGBA1C 6.8 (A) 11/02/2023   Lab Results  Component Value Date   MICROALBUR 1.0 02/02/2024   MICROALBUR 4.6 (H) 03/16/2010     Doing well  Mounjaro  5 mg weekly / is at goal weight and wants to decrease dose to 2.5 mg weekly when done with the 5 (will update) -given written prescription to fill when it is time Metforin 1000 mg bid tolerates well Good diet Encouraged to add more strength building exercise   Normal foot exam today [facility] On ace and statin       Relevant Medications   tirzepatide  (MOUNJARO ) 2.5 MG/0.5ML Pen   Other Visit Diagnoses       Long-term (current) use of injectable non-insulin antidiabetic drugs

## 2024-08-16 ENCOUNTER — Telehealth: Payer: Self-pay

## 2024-08-16 ENCOUNTER — Encounter: Payer: Self-pay | Admitting: Internal Medicine

## 2024-08-16 ENCOUNTER — Other Ambulatory Visit (HOSPITAL_COMMUNITY): Payer: Self-pay

## 2024-08-16 NOTE — Telephone Encounter (Signed)
 Pharmacy Patient Advocate Encounter   Received notification from Onbase that prior authorization for Mounjaro  2.5 is required/requested.   Insurance verification completed.   The patient is insured through [employer].    Per test claim: The current 28 day co-pay is, $0.00.  No PA needed at this time. This test claim was processed through [facility]- copay amounts may vary at other pharmacies due to pharmacy/plan contracts, or as the patient moves through the different stages of their insurance plan.    Patient current PA expires 09/04/24. Will submit for renewal closer to that time.

## 2024-08-22 ENCOUNTER — Inpatient Hospital Stay: Admission: RE | Admit: 2024-08-22 | Discharge: 2024-08-22 | Attending: Family Medicine | Admitting: Family Medicine

## 2024-08-22 DIAGNOSIS — Z1231 Encounter for screening mammogram for malignant neoplasm of breast: Secondary | ICD-10-CM | POA: Diagnosis present

## 2024-08-26 ENCOUNTER — Ambulatory Visit: Payer: Self-pay | Admitting: Family Medicine

## 2024-08-31 ENCOUNTER — Other Ambulatory Visit: Payer: Self-pay | Admitting: Family Medicine

## 2025-02-03 ENCOUNTER — Other Ambulatory Visit

## 2025-02-10 ENCOUNTER — Encounter: Admitting: Family Medicine

## 2025-05-06 ENCOUNTER — Ambulatory Visit
